# Patient Record
Sex: Female | Born: 1955
Health system: Southern US, Community
[De-identification: ages and names within clinical notes are randomized; demographics above are authoritative.]

## PROBLEM LIST (undated history)

## (undated) DIAGNOSIS — K219 Gastro-esophageal reflux disease without esophagitis: Secondary | ICD-10-CM

## (undated) DIAGNOSIS — K5904 Chronic idiopathic constipation: Secondary | ICD-10-CM

## (undated) DIAGNOSIS — Z973 Presence of spectacles and contact lenses: Secondary | ICD-10-CM

## (undated) DIAGNOSIS — N993 Prolapse of vaginal vault after hysterectomy: Secondary | ICD-10-CM

## (undated) DIAGNOSIS — N816 Rectocele: Secondary | ICD-10-CM

## (undated) DIAGNOSIS — E785 Hyperlipidemia, unspecified: Secondary | ICD-10-CM

## (undated) DIAGNOSIS — F419 Anxiety disorder, unspecified: Secondary | ICD-10-CM

## (undated) DIAGNOSIS — M1990 Unspecified osteoarthritis, unspecified site: Secondary | ICD-10-CM

## (undated) DIAGNOSIS — M810 Age-related osteoporosis without current pathological fracture: Secondary | ICD-10-CM

## (undated) DIAGNOSIS — M5430 Sciatica, unspecified side: Secondary | ICD-10-CM

## (undated) DIAGNOSIS — I8390 Asymptomatic varicose veins of unspecified lower extremity: Secondary | ICD-10-CM

## (undated) HISTORY — PX: TOTAL ABDOMINAL HYSTERECTOMY W/ BILATERAL SALPINGOOPHORECTOMY: SHX83

## (undated) HISTORY — PX: BREAST CYST EXCISION: SHX579

## (undated) HISTORY — DX: Age-related osteoporosis without current pathological fracture: M81.0

## (undated) HISTORY — DX: Unspecified osteoarthritis, unspecified site: M19.90

## (undated) HISTORY — DX: Hyperlipidemia, unspecified: E78.5

## (undated) HISTORY — PX: CHOLECYSTECTOMY: SHX55

## (undated) HISTORY — DX: Sciatica, unspecified side: M54.30

## (undated) HISTORY — PX: ABDOMINAL HYSTERECTOMY: SHX81

---

## 2008-06-14 HISTORY — PX: CHOLECYSTECTOMY, LAPAROSCOPIC: SHX56

## 2016-06-05 ENCOUNTER — Encounter: Payer: Self-pay | Admitting: Emergency Medicine

## 2016-06-05 ENCOUNTER — Emergency Department
Admission: EM | Admit: 2016-06-05 | Discharge: 2016-06-05 | Disposition: A | Discharge status: Home / Self Care | Payer: 59 | Attending: Student in an Organized Health Care Education/Training Program | Admitting: Student in an Organized Health Care Education/Training Program

## 2016-06-05 DIAGNOSIS — J029 Acute pharyngitis, unspecified: Secondary | ICD-10-CM | POA: Diagnosis present

## 2016-06-05 DIAGNOSIS — J069 Acute upper respiratory infection, unspecified: Secondary | ICD-10-CM | POA: Diagnosis not present

## 2016-06-05 DIAGNOSIS — J0100 Acute maxillary sinusitis, unspecified: Secondary | ICD-10-CM | POA: Diagnosis not present

## 2016-06-05 MED ORDER — PREDNISONE 10 MG (21) PO TBPK: ORAL_TABLET | ORAL | 0 refills | Status: DC

## 2016-06-05 MED ORDER — AMOXICILLIN 500 MG PO TABS: 500.0000 mg | ORAL_TABLET | Freq: Three times a day (TID) | ORAL | 0 refills | Status: DC

## 2016-06-05 NOTE — ED Provider Notes (Signed)
[facility] Emergency Department Provider Note  ____________________________________________  Time seen: Approximately 5:42 PM  I have reviewed the triage vital signs and the nursing notes.   HISTORY  Chief Complaint Sore Throat    HPI Cheyenne AdasDonella Smail is a 60 y.o. female who presents to the emergency department for evaluation of sore throat, cough, and sinus congestion x 4 days.She states that her symptoms started with sore throat and have progressed. She does not believe she has had a fever. She states her glands are "swollen." No relief with Advil Cold and Sinus.   History reviewed. No pertinent past medical history.  There are no active problems to display for this patient.   Past Surgical History:  Procedure Laterality Date  . ABDOMINAL HYSTERECTOMY      Prior to Admission medications   Medication Sig Start Date End Date Taking? Authorizing Provider  amoxicillin (AMOXIL) 500 MG tablet Take 1 tablet (500 mg total) by mouth 3 (three) times daily. 06/05/16   Chinita Pesterari B Shriyan Arakawa, FNP  predniSONE (STERAPRED UNI-PAK 21 TAB) 10 MG (21) TBPK tablet Take 6 tablets on day 1 Take 5 tablets on day 2 Take 4 tablets on day 3 Take 3 tablets on day 4 Take 2 tablets on day 5 Take 1 tablet on day 6 06/05/16   Chinita Pesterari B Chaz Ronning, FNP    Allergies Patient has no known allergies.  No family history on file.  Social History Social History  Substance Use Topics  . Smoking status: Never Smoker  . Smokeless tobacco: Not on file  . Alcohol use Not on file    Review of Systems Constitutional: Negative for fever. Eyes: No visual changes. ENT: Positive for sore throat; Negative for difficulty swallowing. Respiratory: Denies shortness of breath. Gastrointestinal: No abdominal pain.  No nausea, no vomiting.  No diarrhea.  Genitourinary: Negative for dysuria. Musculoskeletal: Negative for generalized body aches. Skin: Negative for rash. Neurological: Negative for  headaches, focal weakness or numbness.  ____________________________________________   PHYSICAL EXAM:  VITAL SIGNS: ED Triage Vitals  Enc Vitals Group     BP --      Pulse Rate 06/05/16 1658 93     Resp 06/05/16 1658 20     Temp 06/05/16 1658 98.2 F (36.8 C)     Temp Source 06/05/16 1658 Oral     SpO2 06/05/16 1658 96 %     Weight 06/05/16 1659 145 lb (65.8 kg)     Height 06/05/16 1659 5\' 2"  (1.575 m)     Head Circumference --      Peak Flow --      Pain Score 06/05/16 1659 6     Pain Loc --      Pain Edu? --      Excl. in GC? --    Constitutional: Alert and oriented. Well appearing and in no acute distress. Eyes: Conjunctivae are normal. PERRL. EOMI. Head: Atraumatic. Nose: No congestion/rhinnorhea. Mouth/Throat: Mucous membranes are moist.  Oropharynx erythematous, without tonsillar exudate. Neck: No stridor.  Lymphatic: Lymphadenopathy: Bilateral anterior cervical nodes tender to palpation. Cardiovascular: Normal rate, regular rhythm. Good peripheral circulation. Respiratory: Normal respiratory effort. Faint expiratory wheeze noted on the left that clears with cough. Gastrointestinal: Soft and nontender. Musculoskeletal: No lower extremity tenderness nor edema.   Neurologic:  Normal speech and language. No gross focal neurologic deficits are appreciated. Speech is normal. No gait instability. Skin:  Skin is warm, dry and intact. No rash noted Psychiatric: Mood and affect are normal. Speech  and behavior are normal.  ____________________________________________   LABS (all labs ordered are listed, but only abnormal results are displayed)  Labs Reviewed - No data to display ____________________________________________  EKG   ____________________________________________  RADIOLOGY  Not indicated. ____________________________________________   PROCEDURES  Procedure(s) performed: None  Critical Care performed:  No  ____________________________________________   INITIAL IMPRESSION / ASSESSMENT AND PLAN / ED COURSE  Clinical Course     Pertinent labs & imaging results that were available during my care of the patient were reviewed by me and considered in my medical decision making (see chart for details).  Patient to be given amoxicillin and prednisone. She is to follow up with the PCP of her choice for symptoms that are not improving over the next 2-3 days. She is to return to the ER for symptoms that change or worsen if unable to schedule an appointment. ____________________________________________   FINAL CLINICAL IMPRESSION(S) / ED DIAGNOSES  Final diagnoses:  Acute maxillary sinusitis, recurrence not specified  Upper respiratory infection, acute    Note:  This document was prepared using Dragon voice recognition software and may include unintentional dictation errors.    Chinita PesterCari B Shandria Clinch, FNP 06/05/16 1821    Willy EddyPatrick Robinson, MD 06/05/16 2024

## 2016-06-05 NOTE — ED Notes (Signed)
NAD noted at time of D/C. Pt denies questions or concerns. Pt ambulatory to the lobby at this time.  

## 2016-06-05 NOTE — ED Triage Notes (Signed)
Sore throat x 4 days 

## 2017-05-26 ENCOUNTER — Ambulatory Visit (INDEPENDENT_AMBULATORY_CARE_PROVIDER_SITE_OTHER): Payer: BLUE CROSS/BLUE SHIELD | Admitting: Family Medicine

## 2017-05-26 ENCOUNTER — Encounter: Payer: Self-pay | Admitting: Family Medicine

## 2017-05-26 VITALS — BP 120/80 | HR 94 | Temp 98.5°F | Resp 16 | Ht 62.0 in | Wt 165.0 lb

## 2017-05-26 DIAGNOSIS — Z1239 Encounter for other screening for malignant neoplasm of breast: Secondary | ICD-10-CM

## 2017-05-26 DIAGNOSIS — Z1231 Encounter for screening mammogram for malignant neoplasm of breast: Secondary | ICD-10-CM | POA: Diagnosis not present

## 2017-05-26 DIAGNOSIS — Z7689 Persons encountering health services in other specified circumstances: Secondary | ICD-10-CM

## 2017-05-26 DIAGNOSIS — Z0000 Encounter for general adult medical examination without abnormal findings: Secondary | ICD-10-CM

## 2017-05-27 NOTE — Progress Notes (Signed)
Subjective:    Patient ID: Taylor Singleton, female    DOB: June 03, 1956, 61 y.o.   MRN: [id number]  HPI Patient is a very pleasant 61 year old white female here today to establish care.  She denies any significant past medical history.  Past surgical history is significant only for a total abdominal hysterectomy with BSO performed in the patient's 40s due to excessive bleeding.  Patient did not receive hormone therapy afterwards.  She has not seen a gynecologist in several years.  She has not had a Pap smear in several years.  Her mammogram is overdue.  Her last colonoscopy was in approximately 2013.  It was reportedly clear.  She is due for repeat colonoscopy in 2023.  She is also due for hepatitis C screening.  From an immunization standpoint, she is due for a flu shot.  She is not yet due for Pneumovax or Prevnar.  She is due for the shingles vaccine as well as a tetanus shot. No past medical history on file.  Past Surgical History:  Procedure Laterality Date  . ABDOMINAL HYSTERECTOMY     due to bleeding TAH/BSO   Current Outpatient Medications on File Prior to Visit  Medication Sig Dispense Refill  . naproxen (NAPROSYN) 250 MG tablet Take 250 mg by mouth daily as needed.     No current facility-administered medications on file prior to visit.    No Known Allergies Social History   Socioeconomic History  . Marital status: Married    Spouse name: Not on file  . Number of children: Not on file  . Years of education: Not on file  . Highest education level: Not on file  Social Needs  . Financial resource strain: Not on file  . Food insecurity - worry: Not on file  . Food insecurity - inability: Not on file  . Transportation needs - medical: Not on file  . Transportation needs - non-medical: Not on file  Occupational History  . Not on file  Tobacco Use  . Smoking status: Never Smoker  . Smokeless tobacco: Never Used  Substance and Sexual Activity  . Alcohol use: No    Frequency:  Never  . Drug use: No  . Sexual activity: Not on file  Other Topics Concern  . Not on file  Social History Narrative  . Not on file   No family history on file. Denies history of breast, colon cancer or premature CAD. Mom diseased in her 1580's.  Had heart disease.  Dad- alive and 63 years old.  No medical problems.  Review of Systems  All other systems reviewed and are negative.      Objective:   Physical Exam  Constitutional: She is oriented to person, place, and time. She appears well-developed and well-nourished. No distress.  HENT:  Head: Normocephalic and atraumatic.  Right Ear: External ear normal.  Left Ear: External ear normal.  Nose: Nose normal.  Mouth/Throat: Oropharynx is clear and moist. No oropharyngeal exudate.  Eyes: Conjunctivae and EOM are normal. Pupils are equal, round, and reactive to light. Right eye exhibits no discharge. Left eye exhibits no discharge. No scleral icterus.  Neck: Normal range of motion. Neck supple. No JVD present. No tracheal deviation present. No thyromegaly present.  Cardiovascular: Normal rate, regular rhythm, normal heart sounds and intact distal pulses. Exam reveals no gallop and no friction rub.  No murmur heard. Pulmonary/Chest: Effort normal and breath sounds normal. No stridor. No respiratory distress. She has no wheezes. She has no  rales. She exhibits no tenderness.  Abdominal: Soft. Bowel sounds are normal. She exhibits no distension and no mass. There is no tenderness. There is no rebound and no guarding.  Musculoskeletal: Normal range of motion. She exhibits no edema, tenderness or deformity.  Lymphadenopathy:    She has no cervical adenopathy.  Neurological: She is alert and oriented to person, place, and time. She has normal reflexes. She displays normal reflexes. No cranial nerve deficit. She exhibits normal muscle tone. Coordination normal.  Skin: Skin is warm. No rash noted. She is not diaphoretic. No erythema. No pallor.   Psychiatric: She has a normal mood and affect. Her behavior is normal. Judgment and thought content normal.  Vitals reviewed.         Assessment & Plan:  General medical exam - Plan: CBC with Differential/Platelet, COMPLETE METABOLIC PANEL WITH GFR, Lipid panel, Hepatitis C Antibody  Establishing care with new doctor, encounter for  Breast cancer screening - Plan: MM Digital Screening  Physical exam is completely normal.  I recommended a flu shot but the patient refused.  We discussed the shingles shot but she also declined this as well.  She is due for a tetanus shot but she declined this as well.  She will allow me to schedule her for a mammogram.  Because of her past medical history, she does not require a Pap smear.  Her colonoscopy is up-to-date.  She is due for hepatitis C screening.  I will also screen her with a CBC to evaluate for any bone marrow abnormalities, CMP to assess renal and liver function test along with fasting blood sugar, and a fasting lipid panel as the patient states that she may have had borderline cholesterol in the past.  Strongly recommended a bone density test given her history of premature osteoporosis due to surgery.  Patient declined this today.  Recommended calcium 1200 mg a day and vitamin D 1000.

## 2017-05-30 ENCOUNTER — Other Ambulatory Visit: Payer: Self-pay | Admitting: Family Medicine

## 2017-05-30 DIAGNOSIS — Z1231 Encounter for screening mammogram for malignant neoplasm of breast: Secondary | ICD-10-CM

## 2017-06-02 ENCOUNTER — Other Ambulatory Visit: Payer: BLUE CROSS/BLUE SHIELD

## 2017-06-03 ENCOUNTER — Encounter: Payer: Self-pay | Admitting: Family Medicine

## 2017-06-03 DIAGNOSIS — E785 Hyperlipidemia, unspecified: Secondary | ICD-10-CM | POA: Insufficient documentation

## 2017-06-03 LAB — COMPLETE METABOLIC PANEL WITH GFR
AG Ratio: 1.7 (calc) (ref 1.0–2.5)
ALT: 22 U/L (ref 6–29)
AST: 16 U/L (ref 10–35)
Albumin: 4.1 g/dL (ref 3.6–5.1)
Alkaline phosphatase (APISO): 77 U/L (ref 33–130)
BUN: 21 mg/dL (ref 7–25)
CALCIUM: 9.6 mg/dL (ref 8.6–10.4)
CO2: 24 mmol/L (ref 20–32)
CREATININE: 0.75 mg/dL (ref 0.50–0.99)
Chloride: 105 mmol/L (ref 98–110)
GFR, EST AFRICAN AMERICAN: 98 mL/min/{1.73_m2} (ref >=60)
GFR, EST NON AFRICAN AMERICAN: 85 mL/min/{1.73_m2} (ref >=60)
GLOBULIN: 2.4 g/dL (ref 1.9–3.7)
Glucose, Bld: 99 mg/dL (ref 65–99)
Potassium: 4.9 mmol/L (ref 3.5–5.3)
SODIUM: 140 mmol/L (ref 135–146)
TOTAL PROTEIN: 6.5 g/dL (ref 6.1–8.1)
Total Bilirubin: 0.4 mg/dL (ref 0.2–1.2)

## 2017-06-03 LAB — HEPATITIS C ANTIBODY
HEP C AB: NONREACTIVE
SIGNAL TO CUT-OFF: 0.01 (ref <1.00)

## 2017-06-03 LAB — CBC WITH DIFFERENTIAL/PLATELET
BASOS PCT: 1 %
Basophils Absolute: 58 cells/uL (ref 0–200)
EOS PCT: 7.5 %
Eosinophils Absolute: 435 cells/uL (ref 15–500)
HEMATOCRIT: 41.8 % (ref 35.0–45.0)
HEMOGLOBIN: 14.1 g/dL (ref 11.7–15.5)
LYMPHS ABS: 2204 {cells}/uL (ref 850–3900)
MCH: 29.4 pg (ref 27.0–33.0)
MCHC: 33.7 g/dL (ref 32.0–36.0)
MCV: 87.3 fL (ref 80.0–100.0)
MPV: 10.6 fL (ref 7.5–12.5)
Monocytes Relative: 8.5 %
NEUTROS ABS: 2610 {cells}/uL (ref 1500–7800)
NEUTROS PCT: 45 %
Platelets: 379 10*3/uL (ref 140–400)
RBC: 4.79 10*6/uL (ref 3.80–5.10)
RDW: 12.2 % (ref 11.0–15.0)
Total Lymphocyte: 38 %
WBC: 5.8 10*3/uL (ref 3.8–10.8)
WBCMIX: 493 {cells}/uL (ref 200–950)

## 2017-06-03 LAB — LIPID PANEL
CHOL/HDL RATIO: 4.7 (calc) (ref <5.0)
Cholesterol: 234 mg/dL — ABNORMAL HIGH (ref <200)
HDL: 50 mg/dL — AB (ref >50)
LDL Cholesterol (Calc): 158 mg/dL (calc) — ABNORMAL HIGH
NON-HDL CHOLESTEROL (CALC): 184 mg/dL — AB (ref <130)
TRIGLYCERIDES: 139 mg/dL (ref <150)

## 2017-06-06 ENCOUNTER — Ambulatory Visit: Payer: Self-pay | Admitting: Family Medicine

## 2017-06-09 ENCOUNTER — Other Ambulatory Visit: Payer: Self-pay | Admitting: Family Medicine

## 2017-06-09 MED ORDER — ATORVASTATIN CALCIUM 20 MG PO TABS: 20.0000 mg | ORAL_TABLET | Freq: Every day | ORAL | 3 refills | Status: DC

## 2017-06-28 ENCOUNTER — Ambulatory Visit
Admission: RE | Admit: 2017-06-28 | Discharge: 2017-06-28 | Disposition: A | Discharge status: Home / Self Care | Payer: 59 | Source: Ambulatory Visit | Attending: Family Medicine | Admitting: Family Medicine

## 2017-06-28 DIAGNOSIS — Z1231 Encounter for screening mammogram for malignant neoplasm of breast: Secondary | ICD-10-CM

## 2017-09-26 ENCOUNTER — Ambulatory Visit (INDEPENDENT_AMBULATORY_CARE_PROVIDER_SITE_OTHER): Payer: BLUE CROSS/BLUE SHIELD | Admitting: Family Medicine

## 2017-09-26 ENCOUNTER — Encounter: Payer: Self-pay | Admitting: Family Medicine

## 2017-09-26 VITALS — BP 128/70 | HR 86 | Temp 98.3°F | Resp 18 | Ht 62.0 in | Wt 164.0 lb

## 2017-09-26 DIAGNOSIS — J302 Other seasonal allergic rhinitis: Secondary | ICD-10-CM

## 2017-09-26 MED ORDER — PREDNISONE 20 MG PO TABS: ORAL_TABLET | ORAL | 0 refills | Status: DC

## 2017-09-26 MED ORDER — ATORVASTATIN CALCIUM 20 MG PO TABS: 20.0000 mg | ORAL_TABLET | Freq: Every day | ORAL | 3 refills | Status: DC

## 2017-09-26 NOTE — Progress Notes (Signed)
Subjective:    Patient ID: Taylor Singleton, female    DOB: April 25, 1956, 62 y.o.   MRN: [id number]  HPI Patient presents with severe rhinorrhea, head congestion, pnd, cough which is productive of clear mucus, clear sinus drainage, faint expiratory wheezing, and rhonchorous breath sounds for 1 week.  [place]oved last year [place]  No fever, sod, doe, pleurisy.   Past Medical History:  Diagnosis Date  . HLD (hyperlipidemia)    Past Surgical History:  Procedure Laterality Date  . ABDOMINAL HYSTERECTOMY     due to bleeding TAH/BSO  . BREAST CYST EXCISION Left    Current Outpatient Medications on File Prior to Visit  Medication Sig Dispense Refill  . calcium carbonate (CALCIUM 600) 600 MG TABS tablet Take 600 mg by mouth 2 (two) times daily with a meal.    . cholecalciferol (VITAMIN D) 1000 units tablet Take 1,000 Units by mouth daily.    . naproxen (NAPROSYN) 250 MG tablet Take 250 mg by mouth daily as needed.     No current facility-administered medications on file prior to visit.    No Known Allergies Social History   Socioeconomic History  . Marital status: Married    Spouse name: Not on file  . Number of children: Not on file  . Years of education: Not on file  . Highest education level: Not on file  Occupational History  . Not on file  Social Needs  . Financial resource strain: Not on file  . Food insecurity:    Worry: Not on file    Inability: Not on file  . Transportation needs:    Medical: Not on file    Non-medical: Not on file  Tobacco Use  . Smoking status: Never Smoker  . Smokeless tobacco: Never Used  Substance and Sexual Activity  . Alcohol use: No    Frequency: Never  . Drug use: No  . Sexual activity: Not on file  Lifestyle  . Physical activity:    Days per week: Not on file    Minutes per session: Not on file  . Stress: Not on file  Relationships  . Social connections:    Talks on phone: Not on file    Gets together: Not on file    Attends  religious service: Not on file    Active member of club or organization: Not on file    Attends meetings of clubs or organizations: Not on file    Relationship status: Not on file  . Intimate partner violence:    Fear of current or ex partner: Not on file    Emotionally abused: Not on file    Physically abused: Not on file    Forced sexual activity: Not on file  Other Topics Concern  . Not on file  Social History Narrative  . Not on file     Review of Systems     Objective:   Physical Exam  Constitutional: She appears well-developed and well-nourished.  HENT:  Right Ear: Tympanic membrane and ear canal normal.  Left Ear: Tympanic membrane and ear canal normal.  Nose: Mucosal edema and rhinorrhea present. Right sinus exhibits no maxillary sinus tenderness and no frontal sinus tenderness. Left sinus exhibits no maxillary sinus tenderness and no frontal sinus tenderness.  Mouth/Throat: No oropharyngeal exudate, posterior oropharyngeal edema or posterior oropharyngeal erythema.  Neck: Neck supple.  Cardiovascular: Normal rate, regular rhythm and normal heart sounds.  Pulmonary/Chest: Effort normal. No respiratory distress. She has wheezes. She has  no rales.  Lymphadenopathy:    She has no cervical adenopathy.          Assessment & Plan:  Seasonal allergies  Patient is failing flonase as outpatient.  Continue flonase but treat with prednisone taper pack. Once better, continue flonase and zyrtec until spring allergy season passes.

## 2018-01-18 ENCOUNTER — Encounter: Payer: Self-pay | Admitting: Family Medicine

## 2018-01-18 ENCOUNTER — Ambulatory Visit (INDEPENDENT_AMBULATORY_CARE_PROVIDER_SITE_OTHER): Payer: BLUE CROSS/BLUE SHIELD | Admitting: Family Medicine

## 2018-01-18 VITALS — BP 128/74 | HR 82 | Temp 98.2°F | Resp 16 | Ht 62.0 in | Wt 170.0 lb

## 2018-01-18 DIAGNOSIS — L03031 Cellulitis of right toe: Secondary | ICD-10-CM | POA: Diagnosis not present

## 2018-01-18 MED ORDER — CEPHALEXIN 500 MG PO CAPS: 500.0000 mg | ORAL_CAPSULE | Freq: Four times a day (QID) | ORAL | 0 refills | Status: AC

## 2018-01-18 NOTE — Progress Notes (Signed)
Patient ID: Taylor Singleton, female    DOB: June 29, 1955, 62 y.o.   MRN: [id number]  PCP: Donita Brooks, MD  Chief Complaint  Patient presents with  . Right great toe swollen, red and painful    Subjective:   Taylor Singleton is a 62 y.o. female, presents to clinic with CC of right great toe pain, swelling and redness x 1-2 days. wprsemomg.   She had some irritation to outer edge of right great toe nail.  She was unsur eif it was an infection or ingrown toenail.  But she attempted to treat the small area of redness, swelling and pain with trimming the medial edge of the nail very short with clippers.  She had some drainage from the medial side of the nail that is currently dried and crusted.  The amount of redness and swelling is unchanged from before.  It is painful to walk or move her feet.   Toe Pain   The incident occurred 2 days ago. There was no injury mechanism. The pain is present in the right toes. The quality of the pain is described as aching and stabbing. The pain is moderate. The pain has been worsening since onset. She reports no foreign bodies present. The symptoms are aggravated by weight bearing, palpation and movement. She has tried nothing for the symptoms.      Patient Active Problem List   Diagnosis Date Noted  . HLD (hyperlipidemia)      Prior to Admission medications   Medication Sig Start Date End Date Taking? Authorizing Provider  atorvastatin (LIPITOR) 20 MG tablet Take 1 tablet (20 mg total) by mouth daily. 09/26/17  Yes Donita Brooks, MD  calcium carbonate (CALCIUM 600) 600 MG TABS tablet Take 600 mg by mouth 2 (two) times daily with a meal.   Yes [provider]  cholecalciferol (VITAMIN D) 1000 units tablet Take 1,000 Units by mouth daily.   Yes [provider]  naproxen (NAPROSYN) 250 MG tablet Take 250 mg by mouth daily as needed.   Yes [provider]     No Known Allergies   No family history on file.   Social  History   Socioeconomic History  . Marital status: Married    Spouse name: Not on file  . Number of children: Not on file  . Years of education: Not on file  . Highest education level: Not on file  Occupational History  . Not on file  Social Needs  . Financial resource strain: Not on file  . Food insecurity:    Worry: Not on file    Inability: Not on file  . Transportation needs:    Medical: Not on file    Non-medical: Not on file  Tobacco Use  . Smoking status: Never Smoker  . Smokeless tobacco: Never Used  Substance and Sexual Activity  . Alcohol use: No    Frequency: Never  . Drug use: No  . Sexual activity: Not on file  Lifestyle  . Physical activity:    Days per week: Not on file    Minutes per session: Not on file  . Stress: Not on file  Relationships  . Social connections:    Talks on phone: Not on file    Gets together: Not on file    Attends religious service: Not on file    Active member of club or organization: Not on file    Attends meetings of clubs or organizations: Not on file  Relationship status: Not on file  . Intimate partner violence:    Fear of current or ex partner: Not on file    Emotionally abused: Not on file    Physically abused: Not on file    Forced sexual activity: Not on file  Other Topics Concern  . Not on file  Social History Narrative  . Not on file     Review of Systems  Constitutional: Negative.  Negative for activity change, appetite change and fatigue.  HENT: Negative.   Eyes: Negative.   Respiratory: Negative.   Cardiovascular: Negative.   Gastrointestinal: Negative.   Endocrine: Negative.   Genitourinary: Negative.   Musculoskeletal: Negative.   Skin: Negative.   Allergic/Immunologic: Negative.   Hematological: Negative.   All other systems reviewed and are negative.      Objective:    Vitals:   01/18/18 1533  BP: 128/74  Pulse: 82  Resp: 16  Temp: 98.2 F (36.8 C)  TempSrc: Oral  SpO2: 98%    Weight: 170 lb (77.1 kg)  Height: 5\' 2"  (1.575 m)      Physical Exam  Constitutional: She appears well-developed.  HENT:  Head: Normocephalic and atraumatic.  Nose: Nose normal.  Eyes: Conjunctivae are normal. Right eye exhibits no discharge. Left eye exhibits no discharge.  Neck: No tracheal deviation present.  Cardiovascular: Normal rate and regular rhythm.  Pulmonary/Chest: Effort normal. No stridor. No respiratory distress.  Musculoskeletal: Normal range of motion.  Right great toe medial nail margin with mild edema and erythema that is soft to palpation, no induration no fluctuance, mildly ttp.  Nail has been cut shot on medial side with brown crusting to distal medial nail margin, no active drainage, no purulence Normal ROM, sensation, capillary refill  Neurological: She is alert. She exhibits normal muscle tone. Coordination normal.  Skin: Skin is warm and dry. No rash noted.  Psychiatric: She has a normal mood and affect. Her behavior is normal.  Nursing note and vitals reviewed.         Assessment & Plan:      ICD-10-CM   1. Paronychia of great toe, right L03.031    Warm soaks with antiseptic, antibiotic ointment and bandage throughout the day, hold on oral antibiotics, but script for abx printed.  To follow up if worsening area of swelling - will need paronychia I&D, but currently having some drainage.    Danelle BerryLeisa Vincy Feliz, PA-C 01/18/18 3:55 PM

## 2018-01-18 NOTE — Patient Instructions (Addendum)
Do warm soaks 2 x a day for the next several days to a week Use warm clean water with hibiclens (an antiseptic soap) and half hydrogen peroxide.  Apply over the counter antibiotic ointment to affected nail edge during the day or when going out, keep covered with a bandage.  Return for procedure - I&D (incision and drainage) if there is any worsening. (growing area of swelling, redness, firmness extending around nail edge or up toe.   Paronychia Paronychia is an infection of the skin. It happens near a fingernail or toenail. It may cause pain and swelling around the nail. Usually, it is not serious and it clears up with treatment. Follow these instructions at home:  Soak the fingers or toes in warm water as told by your doctor. You may be told to do this for 20 minutes, 2-3 times a day.  Keep the area dry when you are not soaking it.  Take medicines only as told by your doctor.  If you were given an antibiotic medicine, finish all of it even if you start to feel better.  Keep the affected area clean.  Do not try to drain a fluid-filled bump yourself.  Wear rubber gloves when putting your hands in water.  Wear gloves if your hands might touch cleaners or chemicals.  Follow your doctor's instructions about: ? Wound care. ? Bandage (dressing) changes and removal. Contact a doctor if:  Your symptoms get worse or do not improve.  You have a fever or chills.  You have redness spreading from the affected area.  You have more fluid, blood, or pus coming from the affected area.  Your finger or knuckle is swollen or is hard to move. This information is not intended to replace advice given to you by your health care provider. Make sure you discuss any questions you have with your health care provider. Document Released: 05/19/2009 Document Revised: 11/06/2015 Document Reviewed: 05/08/2014 Elsevier Interactive Patient Education  [employer]2018 [employer]

## 2018-02-09 ENCOUNTER — Ambulatory Visit (INDEPENDENT_AMBULATORY_CARE_PROVIDER_SITE_OTHER): Payer: BLUE CROSS/BLUE SHIELD | Admitting: Podiatry

## 2018-02-09 DIAGNOSIS — M79676 Pain in unspecified toe(s): Secondary | ICD-10-CM

## 2018-02-09 DIAGNOSIS — L600 Ingrowing nail: Secondary | ICD-10-CM

## 2018-02-09 DIAGNOSIS — L03032 Cellulitis of left toe: Secondary | ICD-10-CM

## 2018-02-09 NOTE — Progress Notes (Signed)
  Subjective:  Patient ID: Taylor Singleton, female    DOB: 04-May-1956,  MRN: [id number]  Chief Complaint  Patient presents with  . Ingrown Toenail    left hallux    62 y.o. female presents with the above complaint. Reports ingrown toenail to the left great toe. Thinks it started when she got a pedicure a little over a month ago.  States that she went to her primary care doctor office and was started on ciprofloxacin today.  Only took the pills twice.   Review of Systems: Negative except as noted in the HPI. Denies N/V/F/Ch.  Past Medical History:  Diagnosis Date  . HLD (hyperlipidemia)     Current Outpatient Medications:  .  atorvastatin (LIPITOR) 20 MG tablet, Take 1 tablet (20 mg total) by mouth daily., Disp: 90 tablet, Rfl: 3 .  calcium carbonate (CALCIUM 600) 600 MG TABS tablet, Take 600 mg by mouth 2 (two) times daily with a meal., Disp: , Rfl:  .  cholecalciferol (VITAMIN D) 1000 units tablet, Take 1,000 Units by mouth daily., Disp: , Rfl:  .  naproxen (NAPROSYN) 250 MG tablet, Take 250 mg by mouth daily as needed., Disp: , Rfl:   Social History   Tobacco Use  Smoking Status Never Smoker  Smokeless Tobacco Never Used    No Known Allergies Objective:  There were no vitals filed for this visit. There is no height or weight on file to calculate BMI. Constitutional Well developed. Well nourished.  Vascular Dorsalis pedis pulses palpable bilaterally. Posterior tibial pulses palpable bilaterally. Capillary refill normal to all digits.  No cyanosis or clubbing noted. Pedal hair growth normal.  Neurologic Normal speech. Oriented to person, place, and time. Epicritic sensation to light touch grossly present bilaterally.  Dermatologic Painful ingrowing nail at medial nail borders of the hallux nail left. No other open wounds. No skin lesions.  Orthopedic: Normal joint ROM without pain or crepitus bilaterally. No visible deformities. No bony tenderness.   Radiographs:  None Assessment:   1. Ingrown nail   2. Paronychia of great toe, left   3. Pain around toenail    Plan:  Patient was evaluated and treated and all questions answered.  Ingrown Nail, left -Patient elects to proceed with minor surgery to remove ingrown toenail removal today. Consent reviewed and signed by patient. -Ingrown nail excised. See procedure note. -Educated on post-procedure care including soaking. Written instructions provided and reviewed. -Patient to follow up in 2 weeks for nail check.  Procedure: Excision of Ingrown Toenail Location: Left 1st toe lateral nail borders. Anesthesia: Lidocaine 1% plain; 1.5 mL and Marcaine 0.5% plain; 1.5 mL, digital block. Skin Prep: Betadine. Dressing: Silvadene; telfa; dry, sterile, compression dressing. Technique: Following skin prep, the toe was exsanguinated and a tourniquet was secured at the base of the toe. The affected nail border was freed, split with a nail splitter, and excised. Chemical matrixectomy was then performed with phenol and irrigated out with alcohol. The tourniquet was then removed and sterile dressing applied. Disposition: Patient tolerated procedure well. Patient to return in 2 weeks for follow-up.   Return in about 2 weeks (around 02/23/2018) for Nail Check.

## 2018-02-09 NOTE — Patient Instructions (Signed)

## 2018-02-15 ENCOUNTER — Ambulatory Visit: Payer: BLUE CROSS/BLUE SHIELD | Admitting: Family Medicine

## 2018-02-24 ENCOUNTER — Ambulatory Visit: Payer: BLUE CROSS/BLUE SHIELD | Admitting: Podiatry

## 2018-07-25 DIAGNOSIS — M5432 Sciatica, left side: Secondary | ICD-10-CM | POA: Diagnosis not present

## 2018-07-25 DIAGNOSIS — M5431 Sciatica, right side: Secondary | ICD-10-CM | POA: Diagnosis not present

## 2018-07-25 DIAGNOSIS — M9905 Segmental and somatic dysfunction of pelvic region: Secondary | ICD-10-CM | POA: Diagnosis not present

## 2018-07-25 DIAGNOSIS — M9903 Segmental and somatic dysfunction of lumbar region: Secondary | ICD-10-CM | POA: Diagnosis not present

## 2018-07-31 DIAGNOSIS — M9905 Segmental and somatic dysfunction of pelvic region: Secondary | ICD-10-CM | POA: Diagnosis not present

## 2018-07-31 DIAGNOSIS — M5432 Sciatica, left side: Secondary | ICD-10-CM | POA: Diagnosis not present

## 2018-07-31 DIAGNOSIS — M9903 Segmental and somatic dysfunction of lumbar region: Secondary | ICD-10-CM | POA: Diagnosis not present

## 2018-07-31 DIAGNOSIS — M5431 Sciatica, right side: Secondary | ICD-10-CM | POA: Diagnosis not present

## 2018-08-21 DIAGNOSIS — R69 Illness, unspecified: Secondary | ICD-10-CM | POA: Diagnosis not present

## 2018-08-23 DIAGNOSIS — M9903 Segmental and somatic dysfunction of lumbar region: Secondary | ICD-10-CM | POA: Diagnosis not present

## 2018-08-23 DIAGNOSIS — M9905 Segmental and somatic dysfunction of pelvic region: Secondary | ICD-10-CM | POA: Diagnosis not present

## 2018-08-23 DIAGNOSIS — M5431 Sciatica, right side: Secondary | ICD-10-CM | POA: Diagnosis not present

## 2018-08-23 DIAGNOSIS — M5432 Sciatica, left side: Secondary | ICD-10-CM | POA: Diagnosis not present

## 2018-09-12 DIAGNOSIS — M9903 Segmental and somatic dysfunction of lumbar region: Secondary | ICD-10-CM | POA: Diagnosis not present

## 2018-09-12 DIAGNOSIS — M5431 Sciatica, right side: Secondary | ICD-10-CM | POA: Diagnosis not present

## 2018-09-12 DIAGNOSIS — M9905 Segmental and somatic dysfunction of pelvic region: Secondary | ICD-10-CM | POA: Diagnosis not present

## 2018-09-12 DIAGNOSIS — M5432 Sciatica, left side: Secondary | ICD-10-CM | POA: Diagnosis not present

## 2018-09-27 ENCOUNTER — Other Ambulatory Visit: Payer: Self-pay | Admitting: Family Medicine

## 2018-10-17 DIAGNOSIS — M5432 Sciatica, left side: Secondary | ICD-10-CM | POA: Diagnosis not present

## 2018-10-17 DIAGNOSIS — M9905 Segmental and somatic dysfunction of pelvic region: Secondary | ICD-10-CM | POA: Diagnosis not present

## 2018-10-17 DIAGNOSIS — M9903 Segmental and somatic dysfunction of lumbar region: Secondary | ICD-10-CM | POA: Diagnosis not present

## 2018-10-17 DIAGNOSIS — M5431 Sciatica, right side: Secondary | ICD-10-CM | POA: Diagnosis not present

## 2018-11-07 DIAGNOSIS — Z0100 Encounter for examination of eyes and vision without abnormal findings: Secondary | ICD-10-CM | POA: Diagnosis not present

## 2018-11-08 DIAGNOSIS — M5432 Sciatica, left side: Secondary | ICD-10-CM | POA: Diagnosis not present

## 2018-11-08 DIAGNOSIS — M5431 Sciatica, right side: Secondary | ICD-10-CM | POA: Diagnosis not present

## 2018-11-08 DIAGNOSIS — M9905 Segmental and somatic dysfunction of pelvic region: Secondary | ICD-10-CM | POA: Diagnosis not present

## 2018-11-08 DIAGNOSIS — M9903 Segmental and somatic dysfunction of lumbar region: Secondary | ICD-10-CM | POA: Diagnosis not present

## 2018-11-27 DIAGNOSIS — M9905 Segmental and somatic dysfunction of pelvic region: Secondary | ICD-10-CM | POA: Diagnosis not present

## 2018-11-27 DIAGNOSIS — M5432 Sciatica, left side: Secondary | ICD-10-CM | POA: Diagnosis not present

## 2018-11-27 DIAGNOSIS — M5431 Sciatica, right side: Secondary | ICD-10-CM | POA: Diagnosis not present

## 2018-11-27 DIAGNOSIS — M9903 Segmental and somatic dysfunction of lumbar region: Secondary | ICD-10-CM | POA: Diagnosis not present

## 2018-12-12 DIAGNOSIS — M9905 Segmental and somatic dysfunction of pelvic region: Secondary | ICD-10-CM | POA: Diagnosis not present

## 2018-12-12 DIAGNOSIS — M5432 Sciatica, left side: Secondary | ICD-10-CM | POA: Diagnosis not present

## 2018-12-12 DIAGNOSIS — M5431 Sciatica, right side: Secondary | ICD-10-CM | POA: Diagnosis not present

## 2018-12-12 DIAGNOSIS — M9903 Segmental and somatic dysfunction of lumbar region: Secondary | ICD-10-CM | POA: Diagnosis not present

## 2019-01-02 DIAGNOSIS — M5431 Sciatica, right side: Secondary | ICD-10-CM | POA: Diagnosis not present

## 2019-01-02 DIAGNOSIS — M5432 Sciatica, left side: Secondary | ICD-10-CM | POA: Diagnosis not present

## 2019-01-02 DIAGNOSIS — M9903 Segmental and somatic dysfunction of lumbar region: Secondary | ICD-10-CM | POA: Diagnosis not present

## 2019-01-02 DIAGNOSIS — M9905 Segmental and somatic dysfunction of pelvic region: Secondary | ICD-10-CM | POA: Diagnosis not present

## 2019-01-29 DIAGNOSIS — M9905 Segmental and somatic dysfunction of pelvic region: Secondary | ICD-10-CM | POA: Diagnosis not present

## 2019-01-29 DIAGNOSIS — M5432 Sciatica, left side: Secondary | ICD-10-CM | POA: Diagnosis not present

## 2019-01-29 DIAGNOSIS — M5431 Sciatica, right side: Secondary | ICD-10-CM | POA: Diagnosis not present

## 2019-01-29 DIAGNOSIS — M9903 Segmental and somatic dysfunction of lumbar region: Secondary | ICD-10-CM | POA: Diagnosis not present

## 2019-02-09 DIAGNOSIS — M5431 Sciatica, right side: Secondary | ICD-10-CM | POA: Diagnosis not present

## 2019-02-09 DIAGNOSIS — M5432 Sciatica, left side: Secondary | ICD-10-CM | POA: Diagnosis not present

## 2019-02-09 DIAGNOSIS — M9905 Segmental and somatic dysfunction of pelvic region: Secondary | ICD-10-CM | POA: Diagnosis not present

## 2019-02-09 DIAGNOSIS — M9903 Segmental and somatic dysfunction of lumbar region: Secondary | ICD-10-CM | POA: Diagnosis not present

## 2019-02-26 DIAGNOSIS — R69 Illness, unspecified: Secondary | ICD-10-CM | POA: Diagnosis not present

## 2019-02-28 DIAGNOSIS — M5432 Sciatica, left side: Secondary | ICD-10-CM | POA: Diagnosis not present

## 2019-02-28 DIAGNOSIS — M9903 Segmental and somatic dysfunction of lumbar region: Secondary | ICD-10-CM | POA: Diagnosis not present

## 2019-02-28 DIAGNOSIS — M9905 Segmental and somatic dysfunction of pelvic region: Secondary | ICD-10-CM | POA: Diagnosis not present

## 2019-02-28 DIAGNOSIS — M5431 Sciatica, right side: Secondary | ICD-10-CM | POA: Diagnosis not present

## 2019-03-13 DIAGNOSIS — M9903 Segmental and somatic dysfunction of lumbar region: Secondary | ICD-10-CM | POA: Diagnosis not present

## 2019-03-13 DIAGNOSIS — M5431 Sciatica, right side: Secondary | ICD-10-CM | POA: Diagnosis not present

## 2019-03-13 DIAGNOSIS — M5432 Sciatica, left side: Secondary | ICD-10-CM | POA: Diagnosis not present

## 2019-03-13 DIAGNOSIS — M9905 Segmental and somatic dysfunction of pelvic region: Secondary | ICD-10-CM | POA: Diagnosis not present

## 2019-03-30 DIAGNOSIS — M5431 Sciatica, right side: Secondary | ICD-10-CM | POA: Diagnosis not present

## 2019-03-30 DIAGNOSIS — M5432 Sciatica, left side: Secondary | ICD-10-CM | POA: Diagnosis not present

## 2019-03-30 DIAGNOSIS — M9903 Segmental and somatic dysfunction of lumbar region: Secondary | ICD-10-CM | POA: Diagnosis not present

## 2019-03-30 DIAGNOSIS — M9905 Segmental and somatic dysfunction of pelvic region: Secondary | ICD-10-CM | POA: Diagnosis not present

## 2019-04-23 DIAGNOSIS — M5432 Sciatica, left side: Secondary | ICD-10-CM | POA: Diagnosis not present

## 2019-04-23 DIAGNOSIS — M9905 Segmental and somatic dysfunction of pelvic region: Secondary | ICD-10-CM | POA: Diagnosis not present

## 2019-04-23 DIAGNOSIS — M5431 Sciatica, right side: Secondary | ICD-10-CM | POA: Diagnosis not present

## 2019-04-23 DIAGNOSIS — M9903 Segmental and somatic dysfunction of lumbar region: Secondary | ICD-10-CM | POA: Diagnosis not present

## 2019-05-09 DIAGNOSIS — M9905 Segmental and somatic dysfunction of pelvic region: Secondary | ICD-10-CM | POA: Diagnosis not present

## 2019-05-09 DIAGNOSIS — M9903 Segmental and somatic dysfunction of lumbar region: Secondary | ICD-10-CM | POA: Diagnosis not present

## 2019-05-09 DIAGNOSIS — M5432 Sciatica, left side: Secondary | ICD-10-CM | POA: Diagnosis not present

## 2019-05-09 DIAGNOSIS — M5431 Sciatica, right side: Secondary | ICD-10-CM | POA: Diagnosis not present

## 2019-05-30 DIAGNOSIS — M5431 Sciatica, right side: Secondary | ICD-10-CM | POA: Diagnosis not present

## 2019-05-30 DIAGNOSIS — M9905 Segmental and somatic dysfunction of pelvic region: Secondary | ICD-10-CM | POA: Diagnosis not present

## 2019-05-30 DIAGNOSIS — M9903 Segmental and somatic dysfunction of lumbar region: Secondary | ICD-10-CM | POA: Diagnosis not present

## 2019-05-30 DIAGNOSIS — M5432 Sciatica, left side: Secondary | ICD-10-CM | POA: Diagnosis not present

## 2019-06-26 DIAGNOSIS — M9903 Segmental and somatic dysfunction of lumbar region: Secondary | ICD-10-CM | POA: Diagnosis not present

## 2019-06-26 DIAGNOSIS — M9905 Segmental and somatic dysfunction of pelvic region: Secondary | ICD-10-CM | POA: Diagnosis not present

## 2019-06-26 DIAGNOSIS — M5432 Sciatica, left side: Secondary | ICD-10-CM | POA: Diagnosis not present

## 2019-06-26 DIAGNOSIS — M5431 Sciatica, right side: Secondary | ICD-10-CM | POA: Diagnosis not present

## 2019-07-03 DIAGNOSIS — M9905 Segmental and somatic dysfunction of pelvic region: Secondary | ICD-10-CM | POA: Diagnosis not present

## 2019-07-03 DIAGNOSIS — M5431 Sciatica, right side: Secondary | ICD-10-CM | POA: Diagnosis not present

## 2019-07-03 DIAGNOSIS — M9903 Segmental and somatic dysfunction of lumbar region: Secondary | ICD-10-CM | POA: Diagnosis not present

## 2019-07-03 DIAGNOSIS — M5432 Sciatica, left side: Secondary | ICD-10-CM | POA: Diagnosis not present

## 2019-08-08 DIAGNOSIS — M9903 Segmental and somatic dysfunction of lumbar region: Secondary | ICD-10-CM | POA: Diagnosis not present

## 2019-08-08 DIAGNOSIS — M9905 Segmental and somatic dysfunction of pelvic region: Secondary | ICD-10-CM | POA: Diagnosis not present

## 2019-08-08 DIAGNOSIS — M5431 Sciatica, right side: Secondary | ICD-10-CM | POA: Diagnosis not present

## 2019-08-08 DIAGNOSIS — M5432 Sciatica, left side: Secondary | ICD-10-CM | POA: Diagnosis not present

## 2019-08-27 DIAGNOSIS — R69 Illness, unspecified: Secondary | ICD-10-CM | POA: Diagnosis not present

## 2019-08-29 DIAGNOSIS — M9903 Segmental and somatic dysfunction of lumbar region: Secondary | ICD-10-CM | POA: Diagnosis not present

## 2019-08-29 DIAGNOSIS — M9905 Segmental and somatic dysfunction of pelvic region: Secondary | ICD-10-CM | POA: Diagnosis not present

## 2019-08-29 DIAGNOSIS — M5431 Sciatica, right side: Secondary | ICD-10-CM | POA: Diagnosis not present

## 2019-08-29 DIAGNOSIS — M5432 Sciatica, left side: Secondary | ICD-10-CM | POA: Diagnosis not present

## 2019-09-25 ENCOUNTER — Ambulatory Visit (INDEPENDENT_AMBULATORY_CARE_PROVIDER_SITE_OTHER): Payer: Medicare HMO | Admitting: Family Medicine

## 2019-09-25 ENCOUNTER — Other Ambulatory Visit: Payer: Self-pay

## 2019-09-25 ENCOUNTER — Encounter: Payer: Self-pay | Admitting: Family Medicine

## 2019-09-25 VITALS — BP 120/70 | HR 90 | Temp 97.0°F | Resp 16 | Ht 62.0 in | Wt 164.0 lb

## 2019-09-25 DIAGNOSIS — L821 Other seborrheic keratosis: Secondary | ICD-10-CM | POA: Diagnosis not present

## 2019-09-25 DIAGNOSIS — E7800 Pure hypercholesterolemia, unspecified: Secondary | ICD-10-CM

## 2019-09-25 DIAGNOSIS — Z1231 Encounter for screening mammogram for malignant neoplasm of breast: Secondary | ICD-10-CM | POA: Diagnosis not present

## 2019-09-25 NOTE — Progress Notes (Signed)
Subjective:    Patient ID: Taylor Singleton, female    DOB: 1956-06-02, 64 y.o.   MRN: [id number]  HPI Patient has not been seen since 2019.  She is overdue for mammogram and she consents to allow me to schedule this.  She is also overdue for fasting lab work given her history of hyperlipidemia.  I would like her to return fasting for this.  Her primary reason for coming to this office visit was seborrheic keratoses.  She has 4 large seborrheic keratoses on the lateral aspect of her left breast.  The exam was performed with a chaperone present.  Each of the seborrheic keratosis is 7 to 10 mm in diameter.  They have frequently getting irritated and the patient is scratching them due to having to wear a bra.  She also has a large 15 mm seborrheic keratosis on the superior aspect of her left gluteus that is irritated by her belt line.  She is requesting removal of each of these 5 lesions. Past Medical History:  Diagnosis Date  . HLD (hyperlipidemia)    Past Surgical History:  Procedure Laterality Date  . ABDOMINAL HYSTERECTOMY     due to bleeding TAH/BSO  . BREAST CYST EXCISION Left    Current Outpatient Medications on File Prior to Visit  Medication Sig Dispense Refill  . atorvastatin (LIPITOR) 20 MG tablet TAKE 1 TABLET BY MOUTH EVERY DAY 90 tablet 0  . calcium carbonate (CALCIUM 600) 600 MG TABS tablet Take 600 mg by mouth 2 (two) times daily with a meal.    . cholecalciferol (VITAMIN D) 1000 units tablet Take 1,000 Units by mouth daily.    . Lactobacillus-Inulin (PROBIOTIC DIGESTIVE SUPPORT PO) Take by mouth.     No current facility-administered medications on file prior to visit.   No Known Allergies Social History   Socioeconomic History  . Marital status: Married    Spouse name: Not on file  . Number of children: Not on file  . Years of education: Not on file  . Highest education level: Not on file  Occupational History  . Not on file  Tobacco Use  . Smoking status: Never  Smoker  . Smokeless tobacco: Never Used  Substance and Sexual Activity  . Alcohol use: No  . Drug use: No  . Sexual activity: Not on file  Other Topics Concern  . Not on file  Social History Narrative  . Not on file   Social Determinants of Health   Financial Resource Strain:   . Difficulty of Paying Living Expenses:   Food Insecurity:   . Worried About Charity fundraiser in the Last Year:   . Arboriculturist in the Last Year:   Transportation Needs:   . Film/video editor (Medical):   Marland Kitchen Lack of Transportation (Non-Medical):   Physical Activity:   . Days of Exercise per Week:   . Minutes of Exercise per Session:   Stress:   . Feeling of Stress :   Social Connections:   . Frequency of Communication with Friends and Family:   . Frequency of Social Gatherings with Friends and Family:   . Attends Religious Services:   . Active Member of Clubs or Organizations:   . Attends Archivist Meetings:   Marland Kitchen Marital Status:   Intimate Partner Violence:   . Fear of Current or Ex-Partner:   . Emotionally Abused:   Marland Kitchen Physically Abused:   . Sexually Abused:  Review of Systems     Objective:   Physical Exam  Constitutional: She appears well-developed and well-nourished.  HENT:  Right Ear: Tympanic membrane and ear canal normal.  Left Ear: Tympanic membrane and ear canal normal.  Mouth/Throat: No oropharyngeal exudate, posterior oropharyngeal edema or posterior oropharyngeal erythema.  Cardiovascular: Normal rate, regular rhythm and normal heart sounds.  Pulmonary/Chest: Effort normal. No respiratory distress. She has no wheezes. She has no rales.    Musculoskeletal:     Cervical back: Neck supple.       Back:  Lymphadenopathy:    She has no cervical adenopathy.          Assessment & Plan:  Encounter for screening mammogram for malignant neoplasm of breast - Plan: MM Digital Screening  Pure hypercholesterolemia - Plan: CBC with Differential/Platelet,  COMPLETE METABOLIC PANEL WITH GFR, Lipid panel  SK (seborrheic keratosis)  Each of the 5 seborrheic keratosis was treated with liquid nitrogen cryotherapy for a total of 20 seconds each.  The treatment was performed with a chaperone present.  I would like the patient return fasting for a CBC, CMP, and a fasting lipid panel.  I plan to schedule her for a mammogram.

## 2019-09-26 DIAGNOSIS — M5432 Sciatica, left side: Secondary | ICD-10-CM | POA: Diagnosis not present

## 2019-09-26 DIAGNOSIS — M9905 Segmental and somatic dysfunction of pelvic region: Secondary | ICD-10-CM | POA: Diagnosis not present

## 2019-09-26 DIAGNOSIS — M5431 Sciatica, right side: Secondary | ICD-10-CM | POA: Diagnosis not present

## 2019-09-26 DIAGNOSIS — M9903 Segmental and somatic dysfunction of lumbar region: Secondary | ICD-10-CM | POA: Diagnosis not present

## 2019-10-22 DIAGNOSIS — M9905 Segmental and somatic dysfunction of pelvic region: Secondary | ICD-10-CM | POA: Diagnosis not present

## 2019-10-22 DIAGNOSIS — M5432 Sciatica, left side: Secondary | ICD-10-CM | POA: Diagnosis not present

## 2019-10-22 DIAGNOSIS — M5431 Sciatica, right side: Secondary | ICD-10-CM | POA: Diagnosis not present

## 2019-10-22 DIAGNOSIS — M9903 Segmental and somatic dysfunction of lumbar region: Secondary | ICD-10-CM | POA: Diagnosis not present

## 2019-11-13 ENCOUNTER — Ambulatory Visit
Admission: RE | Admit: 2019-11-13 | Discharge: 2019-11-13 | Disposition: A | Discharge status: Home / Self Care | Payer: BLUE CROSS/BLUE SHIELD | Source: Ambulatory Visit | Attending: Family Medicine | Admitting: Family Medicine

## 2019-11-13 ENCOUNTER — Other Ambulatory Visit: Payer: Self-pay

## 2019-11-13 DIAGNOSIS — Z1231 Encounter for screening mammogram for malignant neoplasm of breast: Secondary | ICD-10-CM

## 2019-11-19 DIAGNOSIS — M9905 Segmental and somatic dysfunction of pelvic region: Secondary | ICD-10-CM | POA: Diagnosis not present

## 2019-11-19 DIAGNOSIS — M5431 Sciatica, right side: Secondary | ICD-10-CM | POA: Diagnosis not present

## 2019-11-19 DIAGNOSIS — M5432 Sciatica, left side: Secondary | ICD-10-CM | POA: Diagnosis not present

## 2019-11-19 DIAGNOSIS — M9903 Segmental and somatic dysfunction of lumbar region: Secondary | ICD-10-CM | POA: Diagnosis not present

## 2019-12-04 DIAGNOSIS — M9905 Segmental and somatic dysfunction of pelvic region: Secondary | ICD-10-CM | POA: Diagnosis not present

## 2019-12-04 DIAGNOSIS — M9903 Segmental and somatic dysfunction of lumbar region: Secondary | ICD-10-CM | POA: Diagnosis not present

## 2019-12-04 DIAGNOSIS — M5431 Sciatica, right side: Secondary | ICD-10-CM | POA: Diagnosis not present

## 2019-12-04 DIAGNOSIS — M5432 Sciatica, left side: Secondary | ICD-10-CM | POA: Diagnosis not present

## 2020-02-04 DIAGNOSIS — M5431 Sciatica, right side: Secondary | ICD-10-CM | POA: Diagnosis not present

## 2020-02-04 DIAGNOSIS — M9905 Segmental and somatic dysfunction of pelvic region: Secondary | ICD-10-CM | POA: Diagnosis not present

## 2020-02-04 DIAGNOSIS — M5432 Sciatica, left side: Secondary | ICD-10-CM | POA: Diagnosis not present

## 2020-02-04 DIAGNOSIS — M9903 Segmental and somatic dysfunction of lumbar region: Secondary | ICD-10-CM | POA: Diagnosis not present

## 2020-02-12 DIAGNOSIS — M5431 Sciatica, right side: Secondary | ICD-10-CM | POA: Diagnosis not present

## 2020-02-12 DIAGNOSIS — M9903 Segmental and somatic dysfunction of lumbar region: Secondary | ICD-10-CM | POA: Diagnosis not present

## 2020-02-12 DIAGNOSIS — M5432 Sciatica, left side: Secondary | ICD-10-CM | POA: Diagnosis not present

## 2020-02-12 DIAGNOSIS — M9905 Segmental and somatic dysfunction of pelvic region: Secondary | ICD-10-CM | POA: Diagnosis not present

## 2020-02-25 DIAGNOSIS — H524 Presbyopia: Secondary | ICD-10-CM | POA: Diagnosis not present

## 2020-02-27 DIAGNOSIS — M5432 Sciatica, left side: Secondary | ICD-10-CM | POA: Diagnosis not present

## 2020-02-27 DIAGNOSIS — M5431 Sciatica, right side: Secondary | ICD-10-CM | POA: Diagnosis not present

## 2020-02-27 DIAGNOSIS — M9903 Segmental and somatic dysfunction of lumbar region: Secondary | ICD-10-CM | POA: Diagnosis not present

## 2020-02-27 DIAGNOSIS — M9905 Segmental and somatic dysfunction of pelvic region: Secondary | ICD-10-CM | POA: Diagnosis not present

## 2020-03-03 DIAGNOSIS — M9905 Segmental and somatic dysfunction of pelvic region: Secondary | ICD-10-CM | POA: Diagnosis not present

## 2020-03-03 DIAGNOSIS — R69 Illness, unspecified: Secondary | ICD-10-CM | POA: Diagnosis not present

## 2020-03-03 DIAGNOSIS — M5431 Sciatica, right side: Secondary | ICD-10-CM | POA: Diagnosis not present

## 2020-03-03 DIAGNOSIS — M5432 Sciatica, left side: Secondary | ICD-10-CM | POA: Diagnosis not present

## 2020-03-03 DIAGNOSIS — M9903 Segmental and somatic dysfunction of lumbar region: Secondary | ICD-10-CM | POA: Diagnosis not present

## 2020-03-10 DIAGNOSIS — M9905 Segmental and somatic dysfunction of pelvic region: Secondary | ICD-10-CM | POA: Diagnosis not present

## 2020-03-10 DIAGNOSIS — M9903 Segmental and somatic dysfunction of lumbar region: Secondary | ICD-10-CM | POA: Diagnosis not present

## 2020-03-10 DIAGNOSIS — M5431 Sciatica, right side: Secondary | ICD-10-CM | POA: Diagnosis not present

## 2020-03-10 DIAGNOSIS — M5432 Sciatica, left side: Secondary | ICD-10-CM | POA: Diagnosis not present

## 2020-03-17 DIAGNOSIS — M5432 Sciatica, left side: Secondary | ICD-10-CM | POA: Diagnosis not present

## 2020-03-17 DIAGNOSIS — M5431 Sciatica, right side: Secondary | ICD-10-CM | POA: Diagnosis not present

## 2020-03-17 DIAGNOSIS — M9903 Segmental and somatic dysfunction of lumbar region: Secondary | ICD-10-CM | POA: Diagnosis not present

## 2020-03-17 DIAGNOSIS — M9905 Segmental and somatic dysfunction of pelvic region: Secondary | ICD-10-CM | POA: Diagnosis not present

## 2020-03-24 DIAGNOSIS — M9905 Segmental and somatic dysfunction of pelvic region: Secondary | ICD-10-CM | POA: Diagnosis not present

## 2020-03-24 DIAGNOSIS — M9903 Segmental and somatic dysfunction of lumbar region: Secondary | ICD-10-CM | POA: Diagnosis not present

## 2020-03-24 DIAGNOSIS — M5431 Sciatica, right side: Secondary | ICD-10-CM | POA: Diagnosis not present

## 2020-03-24 DIAGNOSIS — M5432 Sciatica, left side: Secondary | ICD-10-CM | POA: Diagnosis not present

## 2020-03-31 DIAGNOSIS — M5431 Sciatica, right side: Secondary | ICD-10-CM | POA: Diagnosis not present

## 2020-03-31 DIAGNOSIS — M9903 Segmental and somatic dysfunction of lumbar region: Secondary | ICD-10-CM | POA: Diagnosis not present

## 2020-03-31 DIAGNOSIS — M9905 Segmental and somatic dysfunction of pelvic region: Secondary | ICD-10-CM | POA: Diagnosis not present

## 2020-03-31 DIAGNOSIS — M5432 Sciatica, left side: Secondary | ICD-10-CM | POA: Diagnosis not present

## 2020-04-21 DIAGNOSIS — M5431 Sciatica, right side: Secondary | ICD-10-CM | POA: Diagnosis not present

## 2020-04-21 DIAGNOSIS — M9905 Segmental and somatic dysfunction of pelvic region: Secondary | ICD-10-CM | POA: Diagnosis not present

## 2020-04-21 DIAGNOSIS — M5432 Sciatica, left side: Secondary | ICD-10-CM | POA: Diagnosis not present

## 2020-04-21 DIAGNOSIS — M9903 Segmental and somatic dysfunction of lumbar region: Secondary | ICD-10-CM | POA: Diagnosis not present

## 2020-05-06 DIAGNOSIS — M9905 Segmental and somatic dysfunction of pelvic region: Secondary | ICD-10-CM | POA: Diagnosis not present

## 2020-05-06 DIAGNOSIS — M9903 Segmental and somatic dysfunction of lumbar region: Secondary | ICD-10-CM | POA: Diagnosis not present

## 2020-05-06 DIAGNOSIS — M5431 Sciatica, right side: Secondary | ICD-10-CM | POA: Diagnosis not present

## 2020-05-06 DIAGNOSIS — M5432 Sciatica, left side: Secondary | ICD-10-CM | POA: Diagnosis not present

## 2020-05-20 DIAGNOSIS — M5431 Sciatica, right side: Secondary | ICD-10-CM | POA: Diagnosis not present

## 2020-05-20 DIAGNOSIS — M9903 Segmental and somatic dysfunction of lumbar region: Secondary | ICD-10-CM | POA: Diagnosis not present

## 2020-05-20 DIAGNOSIS — M5432 Sciatica, left side: Secondary | ICD-10-CM | POA: Diagnosis not present

## 2020-05-20 DIAGNOSIS — M9905 Segmental and somatic dysfunction of pelvic region: Secondary | ICD-10-CM | POA: Diagnosis not present

## 2020-06-02 DIAGNOSIS — M5432 Sciatica, left side: Secondary | ICD-10-CM | POA: Diagnosis not present

## 2020-06-02 DIAGNOSIS — M9905 Segmental and somatic dysfunction of pelvic region: Secondary | ICD-10-CM | POA: Diagnosis not present

## 2020-06-02 DIAGNOSIS — M5431 Sciatica, right side: Secondary | ICD-10-CM | POA: Diagnosis not present

## 2020-06-02 DIAGNOSIS — M9903 Segmental and somatic dysfunction of lumbar region: Secondary | ICD-10-CM | POA: Diagnosis not present

## 2020-06-16 ENCOUNTER — Other Ambulatory Visit: Payer: Self-pay | Admitting: *Deleted

## 2020-06-16 ENCOUNTER — Telehealth: Payer: Self-pay

## 2020-06-16 DIAGNOSIS — M9903 Segmental and somatic dysfunction of lumbar region: Secondary | ICD-10-CM | POA: Diagnosis not present

## 2020-06-16 DIAGNOSIS — M5431 Sciatica, right side: Secondary | ICD-10-CM | POA: Diagnosis not present

## 2020-06-16 DIAGNOSIS — E7800 Pure hypercholesterolemia, unspecified: Secondary | ICD-10-CM

## 2020-06-16 DIAGNOSIS — Z13220 Encounter for screening for lipoid disorders: Secondary | ICD-10-CM

## 2020-06-16 DIAGNOSIS — Z136 Encounter for screening for cardiovascular disorders: Secondary | ICD-10-CM

## 2020-06-16 DIAGNOSIS — M9905 Segmental and somatic dysfunction of pelvic region: Secondary | ICD-10-CM | POA: Diagnosis not present

## 2020-06-16 DIAGNOSIS — M5432 Sciatica, left side: Secondary | ICD-10-CM | POA: Diagnosis not present

## 2020-06-16 NOTE — Telephone Encounter (Signed)
Pt is wanting to come in for full blood work at 9:15 Tuesday need blood work ordered.

## 2020-06-16 NOTE — Telephone Encounter (Signed)
Future orders placed 

## 2020-06-17 ENCOUNTER — Other Ambulatory Visit: Payer: Medicare HMO

## 2020-06-17 ENCOUNTER — Other Ambulatory Visit: Payer: Self-pay

## 2020-06-17 DIAGNOSIS — E7800 Pure hypercholesterolemia, unspecified: Secondary | ICD-10-CM

## 2020-06-17 DIAGNOSIS — Z13220 Encounter for screening for lipoid disorders: Secondary | ICD-10-CM

## 2020-06-17 DIAGNOSIS — Z136 Encounter for screening for cardiovascular disorders: Secondary | ICD-10-CM | POA: Diagnosis not present

## 2020-06-17 LAB — COMPLETE METABOLIC PANEL WITH GFR
AG Ratio: 1.7 (calc) (ref 1.0–2.5)
ALT: 32 U/L — ABNORMAL HIGH (ref 6–29)
AST: 21 U/L (ref 10–35)
Albumin: 4.4 g/dL (ref 3.6–5.1)
Alkaline phosphatase (APISO): 77 U/L (ref 37–153)
BUN: 23 mg/dL (ref 7–25)
CO2: 28 mmol/L (ref 20–32)
Calcium: 10 mg/dL (ref 8.6–10.4)
Chloride: 104 mmol/L (ref 98–110)
Creat: 0.77 mg/dL (ref 0.50–0.99)
GFR, Est African American: 93 mL/min/{1.73_m2} (ref >=60)
GFR, Est Non African American: 80 mL/min/{1.73_m2} (ref >=60)
Globulin: 2.6 g/dL (calc) (ref 1.9–3.7)
Glucose, Bld: 102 mg/dL — ABNORMAL HIGH (ref 65–99)
Potassium: 4.5 mmol/L (ref 3.5–5.3)
Sodium: 140 mmol/L (ref 135–146)
Total Bilirubin: 0.9 mg/dL (ref 0.2–1.2)
Total Protein: 7 g/dL (ref 6.1–8.1)

## 2020-06-17 LAB — CBC WITH DIFFERENTIAL/PLATELET
Absolute Monocytes: 561 cells/uL (ref 200–950)
Basophils Absolute: 79 cells/uL (ref 0–200)
Basophils Relative: 1.2 %
Eosinophils Absolute: 290 cells/uL (ref 15–500)
Eosinophils Relative: 4.4 %
HCT: 44.9 % (ref 35.0–45.0)
Hemoglobin: 15.3 g/dL (ref 11.7–15.5)
Lymphs Abs: 1980 cells/uL (ref 850–3900)
MCH: 29.9 pg (ref 27.0–33.0)
MCHC: 34.1 g/dL (ref 32.0–36.0)
MCV: 87.9 fL (ref 80.0–100.0)
MPV: 11 fL (ref 7.5–12.5)
Monocytes Relative: 8.5 %
Neutro Abs: 3689 cells/uL (ref 1500–7800)
Neutrophils Relative %: 55.9 %
Platelets: 337 10*3/uL (ref 140–400)
RBC: 5.11 10*6/uL — ABNORMAL HIGH (ref 3.80–5.10)
RDW: 12.1 % (ref 11.0–15.0)
Total Lymphocyte: 30 %
WBC: 6.6 10*3/uL (ref 3.8–10.8)

## 2020-06-17 LAB — LIPID PANEL
Cholesterol: 189 mg/dL (ref <200)
HDL: 55 mg/dL (ref >=50)
LDL Cholesterol (Calc): 111 mg/dL (calc) — ABNORMAL HIGH
Non-HDL Cholesterol (Calc): 134 mg/dL (calc) — ABNORMAL HIGH (ref <130)
Total CHOL/HDL Ratio: 3.4 (calc) (ref <5.0)
Triglycerides: 122 mg/dL (ref <150)

## 2020-06-27 ENCOUNTER — Telehealth: Payer: Self-pay

## 2020-06-27 NOTE — Telephone Encounter (Signed)
Spoke with pt, took home test, she is + of covid. Pt is having all the usual sx. Discussed otc medication for the sx, verbalized understanding.

## 2020-06-30 ENCOUNTER — Ambulatory Visit: Payer: Self-pay | Admitting: Family Medicine

## 2020-07-08 ENCOUNTER — Ambulatory Visit: Payer: Self-pay | Admitting: Family Medicine

## 2020-07-08 DIAGNOSIS — M5432 Sciatica, left side: Secondary | ICD-10-CM | POA: Diagnosis not present

## 2020-07-08 DIAGNOSIS — M9905 Segmental and somatic dysfunction of pelvic region: Secondary | ICD-10-CM | POA: Diagnosis not present

## 2020-07-08 DIAGNOSIS — M9903 Segmental and somatic dysfunction of lumbar region: Secondary | ICD-10-CM | POA: Diagnosis not present

## 2020-07-08 DIAGNOSIS — M5431 Sciatica, right side: Secondary | ICD-10-CM | POA: Diagnosis not present

## 2020-07-10 ENCOUNTER — Other Ambulatory Visit: Payer: Self-pay

## 2020-07-10 ENCOUNTER — Ambulatory Visit (INDEPENDENT_AMBULATORY_CARE_PROVIDER_SITE_OTHER): Payer: Medicare HMO | Admitting: Family Medicine

## 2020-07-10 VITALS — BP 136/80 | HR 83 | Temp 97.2°F | Ht 61.0 in | Wt 168.0 lb

## 2020-07-10 DIAGNOSIS — N816 Rectocele: Secondary | ICD-10-CM | POA: Diagnosis not present

## 2020-07-10 DIAGNOSIS — E7800 Pure hypercholesterolemia, unspecified: Secondary | ICD-10-CM | POA: Diagnosis not present

## 2020-07-10 MED ORDER — ATORVASTATIN CALCIUM 20 MG PO TABS: 20.0000 mg | ORAL_TABLET | Freq: Every day | ORAL | 3 refills | Status: DC

## 2020-07-10 NOTE — Progress Notes (Signed)
Subjective:    Patient ID: Taylor Singleton, female    DOB: 07-23-1955, 65 y.o.   MRN: [id number]  HPI Patient has been off her cholesterol medicine for almost 1 year.  Her most recent lab work is listed below: Appointment on 06/17/2020  Component Date Value Ref Range Status  . Cholesterol 06/17/2020 189  <200 mg/dL Final  . HDL 67/67/2094 55  > OR = 50 mg/dL Final  . Triglycerides 06/17/2020 122  <150 mg/dL Final  . LDL Cholesterol (Calc) 06/17/2020 111* mg/dL (calc) Final   Comment: Reference range: <100 . Desirable range <100 mg/dL for primary prevention;   <70 mg/dL for patients with CHD or diabetic patients  with > or = 2 CHD risk factors. Marland Kitchen LDL-C is now calculated using the Martin-Hopkins  calculation, which is a validated novel method providing  better accuracy than the Friedewald equation in the  estimation of LDL-C.  Horald Pollen et al. Lenox Ahr. 7096;283(66): 2061-2068  (http://education.[facility].com/faq/FAQ164)   . Total CHOL/HDL Ratio 06/17/2020 3.4  <2.9 (calc) Final  . Non-HDL Cholesterol (Calc) 06/17/2020 134* <130 mg/dL (calc) Final   Comment: For patients with diabetes plus 1 major ASCVD risk  factor, treating to a non-HDL-C goal of <100 mg/dL  (LDL-C of <47 mg/dL) is considered a therapeutic  option.   . Glucose, Bld 06/17/2020 102* 65 - 99 mg/dL Final   Comment: .            Fasting reference interval . For someone without known diabetes, a glucose value between 100 and 125 mg/dL is consistent with prediabetes and should be confirmed with a follow-up test. .   . BUN 06/17/2020 23  7 - 25 mg/dL Final  . Creat 65/46/5035 0.77  0.50 - 0.99 mg/dL Final   Comment: For patients >56 years of age, the reference limit for Creatinine is approximately 13% higher for people identified as African-American. .   . GFR, Est Non African American 06/17/2020 80  > OR = 60 mL/min/1.46m2 Final  . GFR, Est African American 06/17/2020 93  > OR = 60 mL/min/1.67m2 Final  .  BUN/Creatinine Ratio 06/17/2020 NOT APPLICABLE  6 - 22 (calc) Final  . Sodium 06/17/2020 140  135 - 146 mmol/L Final  . Potassium 06/17/2020 4.5  3.5 - 5.3 mmol/L Final  . Chloride 06/17/2020 104  98 - 110 mmol/L Final  . CO2 06/17/2020 28  20 - 32 mmol/L Final  . Calcium 06/17/2020 10.0  8.6 - 10.4 mg/dL Final  . Total Protein 06/17/2020 7.0  6.1 - 8.1 g/dL Final  . Albumin 46/56/8127 4.4  3.6 - 5.1 g/dL Final  . Globulin 51/70/0174 2.6  1.9 - 3.7 g/dL (calc) Final  . AG Ratio 06/17/2020 1.7  1.0 - 2.5 (calc) Final  . Total Bilirubin 06/17/2020 0.9  0.2 - 1.2 mg/dL Final  . Alkaline phosphatase (APISO) 06/17/2020 77  37 - 153 U/L Final  . AST 06/17/2020 21  10 - 35 U/L Final  . ALT 06/17/2020 32* 6 - 29 U/L Final  . WBC 06/17/2020 6.6  3.8 - 10.8 Thousand/uL Final  . RBC 06/17/2020 5.11* 3.80 - 5.10 Million/uL Final  . Hemoglobin 06/17/2020 15.3  11.7 - 15.5 g/dL Final  . HCT 94/49/6759 44.9  35.0 - 45.0 % Final  . MCV 06/17/2020 87.9  80.0 - 100.0 fL Final  . MCH 06/17/2020 29.9  27.0 - 33.0 pg Final  . MCHC 06/17/2020 34.1  32.0 - 36.0 g/dL Final  . RDW  06/17/2020 12.1  11.0 - 15.0 % Final  . Platelets 06/17/2020 337  140 - 400 Thousand/uL Final  . MPV 06/17/2020 11.0  7.5 - 12.5 fL Final  . Neutro Abs 06/17/2020 3,689  1,500 - 7,800 cells/uL Final  . Lymphs Abs 06/17/2020 1,980  850 - 3,900 cells/uL Final  . Absolute Monocytes 06/17/2020 561  200 - 950 cells/uL Final  . Eosinophils Absolute 06/17/2020 290  15 - 500 cells/uL Final  . Basophils Absolute 06/17/2020 79  0 - 200 cells/uL Final  . Neutrophils Relative % 06/17/2020 55.9  % Final  . Total Lymphocyte 06/17/2020 30.0  % Final  . Monocytes Relative 06/17/2020 8.5  % Final  . Eosinophils Relative 06/17/2020 4.4  % Final  . Basophils Relative 06/17/2020 1.2  % Final   I calculate her 10-year risk of cardiovascular disease to be 7.6%.  Her brother died of a stroke in his 26s or massive heart attack but it was definitely a  cardiovascular event that happened suddenly.  Her mother had cardiovascular events throughout her later years and died in her 37s.  Therefore she does have a family history of cardiovascular disease.  We discussed the risk and benefits of taking Lipitor and she would like to resume the medication.  She also reports feeling like her rectum bulges into her vagina.  Sometimes she has to put her finger in her vagina to help evacuate her stool.  She denies any melena or hematochezia.  She was told in the past that she had a rectocele. Past Medical History:  Diagnosis Date  . HLD (hyperlipidemia)    Past Surgical History:  Procedure Laterality Date  . ABDOMINAL HYSTERECTOMY     due to bleeding TAH/BSO  . BREAST CYST EXCISION Left    Current Outpatient Medications on File Prior to Visit  Medication Sig Dispense Refill  . ELDERBERRY PO Take by mouth.    . Turmeric (QC TUMERIC COMPLEX PO) Take by mouth.    . calcium carbonate (CALCIUM 600) 600 MG TABS tablet Take 600 mg by mouth 2 (two) times daily with a meal.    . cholecalciferol (VITAMIN D) 1000 units tablet Take 1,000 Units by mouth daily.    . Lactobacillus-Inulin (PROBIOTIC DIGESTIVE SUPPORT PO) Take by mouth.     No current facility-administered medications on file prior to visit.   No Known Allergies Social History   Socioeconomic History  . Marital status: Married    Spouse name: Not on file  . Number of children: Not on file  . Years of education: Not on file  . Highest education level: Not on file  Occupational History  . Not on file  Tobacco Use  . Smoking status: Never Smoker  . Smokeless tobacco: Never Used  Substance and Sexual Activity  . Alcohol use: No  . Drug use: No  . Sexual activity: Not on file  Other Topics Concern  . Not on file  Social History Narrative  . Not on file   Social Determinants of Health   Financial Resource Strain: Not on file  Food Insecurity: Not on file  Transportation Needs: Not on  file  Physical Activity: Not on file  Stress: Not on file  Social Connections: Not on file  Intimate Partner Violence: Not on file     Review of Systems     Objective:   Physical Exam Constitutional:      Appearance: She is well-developed.  HENT:     Right Ear: Tympanic  membrane and ear canal normal.     Left Ear: Tympanic membrane and ear canal normal.     Mouth/Throat:     Pharynx: No oropharyngeal exudate or posterior oropharyngeal erythema.  Cardiovascular:     Rate and Rhythm: Normal rate and regular rhythm.     Heart sounds: Normal heart sounds.  Pulmonary:     Effort: Pulmonary effort is normal. No respiratory distress.     Breath sounds: No wheezing or rales.  Musculoskeletal:     Cervical back: Neck supple.  Lymphadenopathy:     Cervical: No cervical adenopathy.           Assessment & Plan:  Rectocele - Plan: Ambulatory referral to Gynecology  Pure hypercholesterolemia  We discussed the 10-year risk of 7.6%.  With optimal risk factors it decreases to 5%.  That and her family history has commenced the patient to take Lipitor 20 mg a day.  Recheck labs in 6 months.  Patient also would like to see GYN regarding her rectocele which I will set up for her.

## 2020-07-22 DIAGNOSIS — M9903 Segmental and somatic dysfunction of lumbar region: Secondary | ICD-10-CM | POA: Diagnosis not present

## 2020-07-22 DIAGNOSIS — M9905 Segmental and somatic dysfunction of pelvic region: Secondary | ICD-10-CM | POA: Diagnosis not present

## 2020-07-22 DIAGNOSIS — M5432 Sciatica, left side: Secondary | ICD-10-CM | POA: Diagnosis not present

## 2020-07-22 DIAGNOSIS — M5431 Sciatica, right side: Secondary | ICD-10-CM | POA: Diagnosis not present

## 2020-07-29 DIAGNOSIS — M9905 Segmental and somatic dysfunction of pelvic region: Secondary | ICD-10-CM | POA: Diagnosis not present

## 2020-07-29 DIAGNOSIS — M9903 Segmental and somatic dysfunction of lumbar region: Secondary | ICD-10-CM | POA: Diagnosis not present

## 2020-07-29 DIAGNOSIS — M5432 Sciatica, left side: Secondary | ICD-10-CM | POA: Diagnosis not present

## 2020-07-29 DIAGNOSIS — M5431 Sciatica, right side: Secondary | ICD-10-CM | POA: Diagnosis not present

## 2020-08-11 ENCOUNTER — Other Ambulatory Visit: Payer: Self-pay

## 2020-08-11 ENCOUNTER — Ambulatory Visit (INDEPENDENT_AMBULATORY_CARE_PROVIDER_SITE_OTHER): Payer: Medicare HMO | Admitting: Family Medicine

## 2020-08-11 VITALS — BP 132/74 | HR 92 | Temp 98.0°F | Resp 16 | Ht 61.0 in | Wt 166.0 lb

## 2020-08-11 DIAGNOSIS — G44209 Tension-type headache, unspecified, not intractable: Secondary | ICD-10-CM

## 2020-08-11 DIAGNOSIS — M5432 Sciatica, left side: Secondary | ICD-10-CM | POA: Diagnosis not present

## 2020-08-11 DIAGNOSIS — R42 Dizziness and giddiness: Secondary | ICD-10-CM

## 2020-08-11 DIAGNOSIS — M9905 Segmental and somatic dysfunction of pelvic region: Secondary | ICD-10-CM | POA: Diagnosis not present

## 2020-08-11 DIAGNOSIS — F439 Reaction to severe stress, unspecified: Secondary | ICD-10-CM | POA: Diagnosis not present

## 2020-08-11 DIAGNOSIS — M5431 Sciatica, right side: Secondary | ICD-10-CM | POA: Diagnosis not present

## 2020-08-11 DIAGNOSIS — M9903 Segmental and somatic dysfunction of lumbar region: Secondary | ICD-10-CM | POA: Diagnosis not present

## 2020-08-11 DIAGNOSIS — R69 Illness, unspecified: Secondary | ICD-10-CM | POA: Diagnosis not present

## 2020-08-11 MED ORDER — ALPRAZOLAM 0.5 MG PO TABS: 0.5000 mg | ORAL_TABLET | Freq: Three times a day (TID) | ORAL | 0 refills | Status: DC | PRN

## 2020-08-11 NOTE — Progress Notes (Signed)
Subjective:    Patient ID: Taylor Singleton, female    DOB: 1956/04/13, 65 y.o.   MRN: [id number]  Hyperlipidemia   Patient is a 65 female who is here today complaining of disequilibrium.  She states that over the last week, if she looks up or turns her head suddenly she will feel movement like the room is moving around her.  She will feel dizzy.  She denies any syncope or near syncope or tachyarrhythmias or shortness of breath.  She has a positive Dix-Hallpike maneuver today.  She denies any ear pain or decreased hearing.  She does have bitemporal headaches which are pressure-like in nature.  She denies any vision changes.  She states that she is under a lot more stress as her family is living with her mother building their house.  This has her "on edge".  Several people have told her that she needs to drink a glass of wine at night to relax or take something for stress.  I do believe she is having tension headaches.  There is no pain or pressure in her sinuses today.  There is no rhinorrhea or congestion. Past Medical History:  Diagnosis Date  . HLD (hyperlipidemia)    Past Surgical History:  Procedure Laterality Date  . ABDOMINAL HYSTERECTOMY     due to bleeding TAH/BSO  . BREAST CYST EXCISION Left    Current Outpatient Medications on File Prior to Visit  Medication Sig Dispense Refill  . atorvastatin (LIPITOR) 20 MG tablet Take 1 tablet (20 mg total) by mouth daily. 90 tablet 3  . calcium carbonate (OS-CAL) 600 MG TABS tablet Take 600 mg by mouth 2 (two) times daily with a meal.    . cholecalciferol (VITAMIN D) 1000 units tablet Take 1,000 Units by mouth daily.    Marland Kitchen ELDERBERRY PO Take by mouth.    . Lactobacillus-Inulin (PROBIOTIC DIGESTIVE SUPPORT PO) Take by mouth.    . Turmeric (QC TUMERIC COMPLEX PO) Take by mouth. (Patient not taking: Reported on 08/11/2020)     No current facility-administered medications on file prior to visit.   No Known Allergies Social History    Socioeconomic History  . Marital status: Married    Spouse name: Not on file  . Number of children: Not on file  . Years of education: Not on file  . Highest education level: Not on file  Occupational History  . Not on file  Tobacco Use  . Smoking status: Never Smoker  . Smokeless tobacco: Never Used  Substance and Sexual Activity  . Alcohol use: No  . Drug use: No  . Sexual activity: Not on file  Other Topics Concern  . Not on file  Social History Narrative  . Not on file   Social Determinants of Health   Financial Resource Strain: Not on file  Food Insecurity: Not on file  Transportation Needs: Not on file  Physical Activity: Not on file  Stress: Not on file  Social Connections: Not on file  Intimate Partner Violence: Not on file     Review of Systems     Objective:   Physical Exam Constitutional:      Appearance: She is well-developed.  HENT:     Right Ear: Tympanic membrane and ear canal normal.     Left Ear: Tympanic membrane and ear canal normal.     Mouth/Throat:     Pharynx: No oropharyngeal exudate or posterior oropharyngeal erythema.  Cardiovascular:     Rate and Rhythm: Normal  rate and regular rhythm.     Heart sounds: Normal heart sounds.  Pulmonary:     Effort: Pulmonary effort is normal. No respiratory distress.     Breath sounds: No wheezing or rales.  Musculoskeletal:     Cervical back: Neck supple.  Lymphadenopathy:     Cervical: No cervical adenopathy.           Assessment & Plan:  Vertigo  Tension headache  Situational stress  I believe the patient is having vertigo.  I explained the cause of vertigo and we have decided to wait 1 to 2 weeks to see if it will subside on its own.  Consider meclizine if worsening.  I believe she is having tension headaches.  I do not believe she has a sinus infection.  If she develops warning signs such as neurologic deficits, we will proceed with neuroimaging however at the present time I think  if we dealt with her stress more effectively the headaches would improve.  In that regard, we have decided to use Xanax 0.5 mg every 8 hours as needed but to be used sparingly.  Reassess in 1 month or sooner if worsening

## 2020-08-18 ENCOUNTER — Ambulatory Visit (INDEPENDENT_AMBULATORY_CARE_PROVIDER_SITE_OTHER): Payer: Medicare HMO | Admitting: Family Medicine

## 2020-08-18 DIAGNOSIS — H538 Other visual disturbances: Secondary | ICD-10-CM | POA: Diagnosis not present

## 2020-08-18 DIAGNOSIS — R110 Nausea: Secondary | ICD-10-CM

## 2020-08-18 DIAGNOSIS — R42 Dizziness and giddiness: Secondary | ICD-10-CM | POA: Diagnosis not present

## 2020-08-18 DIAGNOSIS — R519 Headache, unspecified: Secondary | ICD-10-CM

## 2020-08-18 NOTE — Progress Notes (Signed)
Subjective:    Patient ID: Taylor Singleton, female    DOB: Dec 20, 1955, 65 y.o.   MRN: [id number]  HPI Patient is a very pleasant 65 year old Caucasian female being seen today as a telephone visit.  Phone call began at 1044.  Phone call concluded at 1054.  Patient consents to be seen via telephone.  Please see my office visit a few weeks ago.  The patient was having vertigo.  She continues to have vertigo first thing in the morning.  Whenever she gets out of bed, she feels very lightheaded.  However the room is not spinning.  Is more of a disequilibrium.  She also is having consistent pressure behind her eyes.  She denies any sinus pain.  She denies any rhinorrhea.  She denies any dental pain.  She is also developing daily nausea.  She is not vomiting.  She had an eye exam 7 months ago that was normal.  Previously I thought the patient was experiencing tension headaches however she denies any pressure-like sensation in the temples of her head now.  Is more of a constant pressure behind her eyes.  She is also developing some blurry vision along with a sense of disequilibrium and now nausea. Past Medical History:  Diagnosis Date  . HLD (hyperlipidemia)    Past Surgical History:  Procedure Laterality Date  . ABDOMINAL HYSTERECTOMY     due to bleeding TAH/BSO  . BREAST CYST EXCISION Left    Current Outpatient Medications on File Prior to Visit  Medication Sig Dispense Refill  . ALPRAZolam (XANAX) 0.5 MG tablet Take 1 tablet (0.5 mg total) by mouth 3 (three) times daily as needed. 30 tablet 0  . atorvastatin (LIPITOR) 20 MG tablet Take 1 tablet (20 mg total) by mouth daily. 90 tablet 3  . calcium carbonate (OS-CAL) 600 MG TABS tablet Take 600 mg by mouth 2 (two) times daily with a meal.    . cholecalciferol (VITAMIN D) 1000 units tablet Take 1,000 Units by mouth daily.    Marland Kitchen ELDERBERRY PO Take by mouth.    . Lactobacillus-Inulin (PROBIOTIC DIGESTIVE SUPPORT PO) Take by mouth.    . Turmeric (QC  TUMERIC COMPLEX PO) Take by mouth. (Patient not taking: Reported on 08/11/2020)     No current facility-administered medications on file prior to visit.   No Known Allergies Social History   Socioeconomic History  . Marital status: Married    Spouse name: Not on file  . Number of children: Not on file  . Years of education: Not on file  . Highest education level: Not on file  Occupational History  . Not on file  Tobacco Use  . Smoking status: Never Smoker  . Smokeless tobacco: Never Used  Substance and Sexual Activity  . Alcohol use: No  . Drug use: No  . Sexual activity: Not on file  Other Topics Concern  . Not on file  Social History Narrative  . Not on file   Social Determinants of Health   Financial Resource Strain: Not on file  Food Insecurity: Not on file  Transportation Needs: Not on file  Physical Activity: Not on file  Stress: Not on file  Social Connections: Not on file  Intimate Partner Violence: Not on file      Review of Systems  All other systems reviewed and are negative.      Objective:   Physical Exam        Assessment & Plan:  Dysequilibrium - Plan: CT Head  Wo Contrast  Pressure in head - Plan: CT Head Wo Contrast  Blurry vision - Plan: CT Head Wo Contrast  Nausea - Plan: CT Head Wo Contrast  Symptoms have been going on now consistently and seem to be getting worse.  Her constellation of symptoms is concerning for a space-occupying lesion in the brain.  She is having disequilibrium, pressure-like sensation in the head, blurry vision, and nausea on a daily basis.  Therefore I would like to obtain a CT scan of the head to evaluate further.

## 2020-08-19 DIAGNOSIS — M5431 Sciatica, right side: Secondary | ICD-10-CM | POA: Diagnosis not present

## 2020-08-19 DIAGNOSIS — M9901 Segmental and somatic dysfunction of cervical region: Secondary | ICD-10-CM | POA: Diagnosis not present

## 2020-08-19 DIAGNOSIS — M9903 Segmental and somatic dysfunction of lumbar region: Secondary | ICD-10-CM | POA: Diagnosis not present

## 2020-08-20 DIAGNOSIS — M9901 Segmental and somatic dysfunction of cervical region: Secondary | ICD-10-CM | POA: Diagnosis not present

## 2020-08-20 DIAGNOSIS — M5431 Sciatica, right side: Secondary | ICD-10-CM | POA: Diagnosis not present

## 2020-08-20 DIAGNOSIS — M9903 Segmental and somatic dysfunction of lumbar region: Secondary | ICD-10-CM | POA: Diagnosis not present

## 2020-08-21 DIAGNOSIS — M5431 Sciatica, right side: Secondary | ICD-10-CM | POA: Diagnosis not present

## 2020-08-21 DIAGNOSIS — M9901 Segmental and somatic dysfunction of cervical region: Secondary | ICD-10-CM | POA: Diagnosis not present

## 2020-08-21 DIAGNOSIS — M9903 Segmental and somatic dysfunction of lumbar region: Secondary | ICD-10-CM | POA: Diagnosis not present

## 2020-08-25 ENCOUNTER — Other Ambulatory Visit: Payer: Self-pay

## 2020-08-25 ENCOUNTER — Encounter: Payer: Self-pay | Admitting: Obstetrics & Gynecology

## 2020-08-25 ENCOUNTER — Ambulatory Visit: Payer: Medicare HMO | Admitting: Obstetrics & Gynecology

## 2020-08-25 VITALS — BP 146/80 | HR 80 | Ht 61.0 in | Wt 164.6 lb

## 2020-08-25 DIAGNOSIS — N816 Rectocele: Secondary | ICD-10-CM

## 2020-08-25 NOTE — Progress Notes (Signed)
   GYN VISIT Patient name: Taylor Singleton MRN [id number]  Date of birth: 1955-10-23 Chief Complaint:   New Patient (Initial Visit) and Gynecologic Exam  History of Present Illness:   Taylor Singleton is a 65 y.o. G2P2002 postmenopausal post-hyst female being seen today for prolapse concerns.     Notes this has been an ongoing issues for over 69yrs that has progressively gotten worse.  Notes that she has to push up in her vagina a few times a day to help with BMs.  Usually has BM once daily.   +Urinary frequency, denies urgency or incontinence.  Denies pelvic or abdominal pain.  Denies abnormal vaginal discharge, itching or irritation. Not sexually active  No LMP recorded. Patient has had a hysterectomy.   Moved down here from [place].  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  Goal was to stop @ 65yo  Depression screen [facility] 2/9 08/25/2020 07/10/2020 09/26/2017 05/26/2017  Decreased Interest 0 0 0 0  Down, Depressed, Hopeless 0 0 0 0  PHQ - 2 Score 0 0 0 0  Altered sleeping 0 - - -  Tired, decreased energy 0 - - -  Change in appetite 0 - - -  Feeling bad or failure about yourself  0 - - -  Trouble concentrating 0 - - -  Moving slowly or fidgety/restless 0 - - -  Suicidal thoughts 0 - - -  PHQ-9 Score 0 - - -     Current Outpatient Medications  Medication Instructions  . atorvastatin (LIPITOR) 20 mg, Oral, Daily  . calcium carbonate (OS-CAL) 600 mg, Oral, 2 times daily with meals  . cholecalciferol (VITAMIN D) 1,000 Units, Oral, Daily  . ELDERBERRY PO Oral  . HORSE CHESTNUT PO Oral  . Lactobacillus-Inulin (PROBIOTIC DIGESTIVE SUPPORT PO) Oral     Review of Systems:   Pertinent items are noted in HPI Denies fever/chills, dizziness, headaches, visual disturbances, fatigue, shortness of breath, chest pain, abdominal pain, vomiting, bowel movements, urination, or intercourse unless otherwise stated above.   Pertinent History Reviewed:  Reviewed past  medical,surgical, social, obstetrical and family history.  Reviewed problem list, medications and allergies. Physical Assessment:   Vitals:   08/25/20 1036  BP: (!) 146/80  Pulse: 80  Weight: 164 lb 9.6 oz (74.7 kg)  Height: 5\' 1"  (1.549 m)  Body mass index is 31.1 kg/m.       Physical Examination:   General appearance: alert, well appearing, and in no distress  Mental status: alert, oriented to person, place, and time  Skin: warm & dry   Cardiovascular: normal heart rate noted  Respiratory: normal respiratory effort, no distress  Abdomen: soft, non-tender, no rebound, no guarding  Pelvic: normal external genitalia, vulva, vagina, cervix, uterus and adnexa, VULVA: normal appearing vulva with no masses, tenderness or lesions, VAGINA: normal appearing vagina with normal color and discharge, no anterior compartment prolapse, apex well supported, Stage 2 rectocele appreciated, uterus and cervix surgically absent  Extremities: no edema   Chaperone: Angel Neas    Assessment & Plan:  1) Stage 2 rectocele Reviewed conservative management including pelvic floor exercises and pessary.  Also discussed surgical intervention- would consider referral to Dr. .  Questions and concerns were addressed.  Plans to do some research on her own and will let Florian Buff know final decision  [place], DO Attending Obstetrician & Gynecologist, [facility]

## 2020-08-25 NOTE — Patient Instructions (Signed)
Rectocele  -Pelvic floor therapy/exercise -Pessary Or consider surgical intervention/2nd opinion- Dr. Lanetta Inch

## 2020-08-26 ENCOUNTER — Other Ambulatory Visit: Payer: Self-pay

## 2020-08-26 DIAGNOSIS — M9901 Segmental and somatic dysfunction of cervical region: Secondary | ICD-10-CM | POA: Diagnosis not present

## 2020-08-26 DIAGNOSIS — M5431 Sciatica, right side: Secondary | ICD-10-CM | POA: Diagnosis not present

## 2020-08-26 DIAGNOSIS — M9903 Segmental and somatic dysfunction of lumbar region: Secondary | ICD-10-CM | POA: Diagnosis not present

## 2020-08-28 DIAGNOSIS — M9901 Segmental and somatic dysfunction of cervical region: Secondary | ICD-10-CM | POA: Diagnosis not present

## 2020-08-28 DIAGNOSIS — M5431 Sciatica, right side: Secondary | ICD-10-CM | POA: Diagnosis not present

## 2020-08-28 DIAGNOSIS — M9903 Segmental and somatic dysfunction of lumbar region: Secondary | ICD-10-CM | POA: Diagnosis not present

## 2020-09-04 DIAGNOSIS — M9903 Segmental and somatic dysfunction of lumbar region: Secondary | ICD-10-CM | POA: Diagnosis not present

## 2020-09-04 DIAGNOSIS — M5431 Sciatica, right side: Secondary | ICD-10-CM | POA: Diagnosis not present

## 2020-09-04 DIAGNOSIS — M9901 Segmental and somatic dysfunction of cervical region: Secondary | ICD-10-CM | POA: Diagnosis not present

## 2020-09-09 DIAGNOSIS — M9903 Segmental and somatic dysfunction of lumbar region: Secondary | ICD-10-CM | POA: Diagnosis not present

## 2020-09-09 DIAGNOSIS — M5431 Sciatica, right side: Secondary | ICD-10-CM | POA: Diagnosis not present

## 2020-09-09 DIAGNOSIS — M9901 Segmental and somatic dysfunction of cervical region: Secondary | ICD-10-CM | POA: Diagnosis not present

## 2020-09-16 DIAGNOSIS — M9901 Segmental and somatic dysfunction of cervical region: Secondary | ICD-10-CM | POA: Diagnosis not present

## 2020-09-16 DIAGNOSIS — M9903 Segmental and somatic dysfunction of lumbar region: Secondary | ICD-10-CM | POA: Diagnosis not present

## 2020-09-16 DIAGNOSIS — M5431 Sciatica, right side: Secondary | ICD-10-CM | POA: Diagnosis not present

## 2020-09-24 DIAGNOSIS — M5431 Sciatica, right side: Secondary | ICD-10-CM | POA: Diagnosis not present

## 2020-09-24 DIAGNOSIS — M9903 Segmental and somatic dysfunction of lumbar region: Secondary | ICD-10-CM | POA: Diagnosis not present

## 2020-09-24 DIAGNOSIS — M9901 Segmental and somatic dysfunction of cervical region: Secondary | ICD-10-CM | POA: Diagnosis not present

## 2020-09-29 DIAGNOSIS — M9903 Segmental and somatic dysfunction of lumbar region: Secondary | ICD-10-CM | POA: Diagnosis not present

## 2020-09-29 DIAGNOSIS — M9901 Segmental and somatic dysfunction of cervical region: Secondary | ICD-10-CM | POA: Diagnosis not present

## 2020-09-29 DIAGNOSIS — M5431 Sciatica, right side: Secondary | ICD-10-CM | POA: Diagnosis not present

## 2020-10-07 DIAGNOSIS — M9901 Segmental and somatic dysfunction of cervical region: Secondary | ICD-10-CM | POA: Diagnosis not present

## 2020-10-07 DIAGNOSIS — M5431 Sciatica, right side: Secondary | ICD-10-CM | POA: Diagnosis not present

## 2020-10-07 DIAGNOSIS — M9903 Segmental and somatic dysfunction of lumbar region: Secondary | ICD-10-CM | POA: Diagnosis not present

## 2020-10-14 DIAGNOSIS — M9903 Segmental and somatic dysfunction of lumbar region: Secondary | ICD-10-CM | POA: Diagnosis not present

## 2020-10-14 DIAGNOSIS — M5431 Sciatica, right side: Secondary | ICD-10-CM | POA: Diagnosis not present

## 2020-10-14 DIAGNOSIS — M9901 Segmental and somatic dysfunction of cervical region: Secondary | ICD-10-CM | POA: Diagnosis not present

## 2020-10-22 DIAGNOSIS — H04123 Dry eye syndrome of bilateral lacrimal glands: Secondary | ICD-10-CM | POA: Diagnosis not present

## 2020-10-28 DIAGNOSIS — M9901 Segmental and somatic dysfunction of cervical region: Secondary | ICD-10-CM | POA: Diagnosis not present

## 2020-10-28 DIAGNOSIS — M5431 Sciatica, right side: Secondary | ICD-10-CM | POA: Diagnosis not present

## 2020-10-28 DIAGNOSIS — M9903 Segmental and somatic dysfunction of lumbar region: Secondary | ICD-10-CM | POA: Diagnosis not present

## 2020-11-04 DIAGNOSIS — M9901 Segmental and somatic dysfunction of cervical region: Secondary | ICD-10-CM | POA: Diagnosis not present

## 2020-11-04 DIAGNOSIS — M9903 Segmental and somatic dysfunction of lumbar region: Secondary | ICD-10-CM | POA: Diagnosis not present

## 2020-11-04 DIAGNOSIS — M5431 Sciatica, right side: Secondary | ICD-10-CM | POA: Diagnosis not present

## 2020-11-14 ENCOUNTER — Ambulatory Visit: Payer: Medicare HMO

## 2020-11-17 DIAGNOSIS — M5431 Sciatica, right side: Secondary | ICD-10-CM | POA: Diagnosis not present

## 2020-11-17 DIAGNOSIS — M9901 Segmental and somatic dysfunction of cervical region: Secondary | ICD-10-CM | POA: Diagnosis not present

## 2020-11-17 DIAGNOSIS — M9903 Segmental and somatic dysfunction of lumbar region: Secondary | ICD-10-CM | POA: Diagnosis not present

## 2020-11-27 NOTE — Progress Notes (Signed)
Subjective:   Taylor Singleton is a 65 y.o. female who presents for an Initial Medicare Annual Wellness Visit.  I connected with Cheyenne Adas today by telephone and verified that I am speaking with the correct person using two identifiers. Location patient: home Location provider: work Persons participating in the virtual visit: patient, provider.   I discussed the limitations, risks, security and privacy concerns of performing an evaluation and management service by telephone and the availability of in person appointments. I also discussed with the patient that there may be a patient responsible charge related to this service. The patient expressed understanding and verbally consented to this telephonic visit.    Interactive audio and video telecommunications were attempted between this provider and patient, however failed, due to patient having technical difficulties OR patient did not have access to video capability.  We continued and completed visit with audio only.     Review of Systems    NA  Cardiac Risk Factors include: advanced age (>52men, >25 women);dyslipidemia     Objective:    Today's Vitals   There is no height or weight on file to calculate BMI.  Advanced Directives 11/28/2020 06/05/2016  Does Patient Have a Medical Advance Directive? Yes No  Type of Advance Directive Healthcare Power of Attorney -  Does patient want to make changes to medical advance directive? No - Patient declined -  Copy of Healthcare Power of Attorney in Chart? No - copy requested -  Would patient like information on creating a medical advance directive? - No - Patient declined    Current Medications (verified) Outpatient Encounter Medications as of 11/28/2020  Medication Sig   atorvastatin (LIPITOR) 20 MG tablet Take 1 tablet (20 mg total) by mouth daily.   calcium carbonate (OS-CAL) 600 MG TABS tablet Take 600 mg by mouth 2 (two) times daily with a meal.   cholecalciferol (VITAMIN D) 1000  units tablet Take 1,000 Units by mouth daily.   ELDERBERRY PO Take by mouth.   HORSE CHESTNUT PO Take by mouth.   [DISCONTINUED] Lactobacillus-Inulin (PROBIOTIC DIGESTIVE SUPPORT PO) Take by mouth. (Patient not taking: Reported on 11/28/2020)   No facility-administered encounter medications on file as of 11/28/2020.    Allergies (verified) Patient has no known allergies.   History: Past Medical History:  Diagnosis Date   Arthritis    HLD (hyperlipidemia)    Past Surgical History:  Procedure Laterality Date   ABDOMINAL HYSTERECTOMY     due to bleeding TAH/BSO   BREAST CYST EXCISION Left    Family History  Problem Relation Age of Onset   Diabetes Mother    Heart attack Brother    Social History   Socioeconomic History   Marital status: Married    Spouse name: Not on file   Number of children: 2   Years of education: Not on file   Highest education level: Not on file  Occupational History   Not on file  Tobacco Use   Smoking status: Never   Smokeless tobacco: Never  Vaping Use   Vaping Use: Never used  Substance and Sexual Activity   Alcohol use: No   Drug use: No   Sexual activity: Not Currently    Birth control/protection: Surgical  Other Topics Concern   Not on file  Social History Narrative   Not on file   Social Determinants of Health   Financial Resource Strain: Low Risk    Difficulty of Paying Living Expenses: Not hard at all  Food  Insecurity: No Food Insecurity   Worried About Programme researcher, broadcasting/film/videounning Out of Food in the Last Year: Never true   Ran Out of Food in the Last Year: Never true  Transportation Needs: No Transportation Needs   Lack of Transportation (Medical): No   Lack of Transportation (Non-Medical): No  Physical Activity: Sufficiently Active   Days of Exercise per Week: 7 days   Minutes of Exercise per Session: 30 min  Stress: No Stress Concern Present   Feeling of Stress : Not at all  Social Connections: Moderately Isolated   Frequency of  Communication with Friends and Family: More than three times a week   Frequency of Social Gatherings with Friends and Family: More than three times a week   Attends Religious Services: Never   Database administratorActive Member of Clubs or Organizations: No   Attends Engineer, structuralClub or Organization Meetings: Never   Marital Status: Married    Tobacco Counseling Counseling given: Not Answered   Clinical Intake:  Pre-visit preparation completed: Yes  Pain : No/denies pain     Nutritional Risks: None Diabetes: No  How often do you need to have someone help you when you read instructions, pamphlets, or other written materials from your doctor or pharmacy?: 1 - Never  Diabetic?No   Interpreter Needed?: No  Information entered by :: SCrews,LPN   Activities of Daily Living In your present state of health, do you have any difficulty performing the following activities: 11/28/2020 07/10/2020  Hearing? N N  Vision? N N  Difficulty concentrating or making decisions? N N  Walking or climbing stairs? N N  Dressing or bathing? N N  Doing errands, shopping? N N  Preparing Food and eating ? N -  Using the Toilet? N -  In the past six months, have you accidently leaked urine? N -  Do you have problems with loss of bowel control? N -  Managing your Medications? N -  Managing your Finances? N -  Housekeeping or managing your Housekeeping? N -  Some recent data might be hidden    Patient Care Team: Donita BrooksPickard, Warren T, MD as PCP - General (Family Medicine)  Indicate any recent Medical Services you may have received from other than [facility] providers in the past year (date may be approximate).     Assessment:   This is a routine wellness examination for Taylor Singleton.  Hearing/Vision screen Vision Screening - Comments:: Patient states gets eyes examined every other year. Currently wears glasses   Dietary issues and exercise activities discussed: Current Exercise Habits: Home exercise routine, Type of exercise:  walking;treadmill, Time (Minutes): 30, Frequency (Times/Week): 7, Weekly Exercise (Minutes/Week): 210, Intensity: Moderate, Exercise limited by: None identified   Goals Addressed             This Visit's Progress    Patient Stated       I would like to continue to walk daily        Depression Screen PHQ 2/9 Scores 11/28/2020 08/25/2020 07/10/2020 09/26/2017 05/26/2017  PHQ - 2 Score 0 0 0 0 0  PHQ- 9 Score - 0 - - -    Fall Risk Fall Risk  11/28/2020 08/25/2020 07/10/2020  Falls in the past year? 0 0 0  Number falls in past yr: 0 0 0  Injury with Fall? 0 0 0  Risk for fall due to : No Fall Risks No Fall Risks -  Follow up Falls evaluation completed;Falls prevention discussed - Falls evaluation completed    FALL RISK PREVENTION  PERTAINING TO THE HOME:  Any stairs in or around the home? Yes  If so, are there any without handrails? No  Home free of loose throw rugs in walkways, pet beds, electrical cords, etc? Yes  Adequate lighting in your home to reduce risk of falls? Yes   ASSISTIVE DEVICES UTILIZED TO PREVENT FALLS:  Life alert? No  Use of a cane, walker or w/c? No  Grab bars in the bathroom? No  Shower chair or bench in shower? No  Elevated toilet seat or a handicapped toilet? No    Cognitive Function:    Normal cognitive status assessed by direct observation by this Nurse Health Advisor. No abnormalities found.      Immunizations Immunization History  Administered Date(s) Administered   PFIZER(Purple Top)SARS-COV-2 Vaccination 08/07/2019, 08/28/2019    TDAP status: Due, Education has been provided regarding the importance of this vaccine. Advised may receive this vaccine at local pharmacy or Health Dept. Aware to provide a copy of the vaccination record if obtained from local pharmacy or Health Dept. Verbalized acceptance and understanding.  Flu Vaccine status: Due, Education has been provided regarding the importance of this vaccine. Advised may receive this  vaccine at local pharmacy or Health Dept. Aware to provide a copy of the vaccination record if obtained from local pharmacy or Health Dept. Verbalized acceptance and understanding.  Pneumococcal vaccine status: Due, Education has been provided regarding the importance of this vaccine. Advised may receive this vaccine at local pharmacy or Health Dept. Aware to provide a copy of the vaccination record if obtained from local pharmacy or Health Dept. Verbalized acceptance and understanding.  Covid-19 vaccine status: Completed vaccines  Qualifies for Shingles Vaccine? Yes   Zostavax completed No   Shingrix Completed?: No.    Education has been provided regarding the importance of this vaccine. Patient has been advised to call insurance company to determine out of pocket expense if they have not yet received this vaccine. Advised may also receive vaccine at local pharmacy or Health Dept. Verbalized acceptance and understanding.  Screening Tests Health Maintenance  Topic Date Due   TETANUS/TDAP  Never done   Zoster Vaccines- Shingrix (1 of 2) Never done   DEXA SCAN  Never done   PNA vac Low Risk Adult (1 of 2 - PCV13) Never done   COVID-19 Vaccine (3 - Pfizer risk series) 09/25/2019   MAMMOGRAM  11/12/2020   INFLUENZA VACCINE  01/12/2021   COLONOSCOPY (Pts 45-72yrs Insurance coverage will need to be confirmed)  06/14/2021   Hepatitis C Screening  Completed   HPV VACCINES  Aged Out    Health Maintenance  Health Maintenance Due  Topic Date Due   TETANUS/TDAP  Never done   Zoster Vaccines- Shingrix (1 of 2) Never done   DEXA SCAN  Never done   PNA vac Low Risk Adult (1 of 2 - PCV13) Never done   COVID-19 Vaccine (3 - Pfizer risk series) 09/25/2019   MAMMOGRAM  11/12/2020    Colorectal cancer screening: Type of screening: Colonoscopy. Completed 06/15/2011. Repeat every 10 years  Mammogram status: Ordered 11/28/2020. Pt provided with contact info and advised to call to schedule appt.    Bone Density status: Ordered 11/28/2020. Pt provided with contact info and advised to call to schedule appt.  Lung Cancer Screening: (Low Dose CT Chest recommended if Age 53-80 years, 30 pack-year currently smoking OR have quit w/in 15years.) does not qualify.   Lung Cancer Screening Referral: N/A  Additional Screening:  Hepatitis C Screening: does qualify; Completed 06/02/2017  Vision Screening: Recommended annual ophthalmology exams for early detection of glaucoma and other disorders of the eye. Is the patient up to date with their annual eye exam?  No  Who is the provider or what is the name of the office in which the patient attends annual eye exams? [facility] [place] If pt is not established with a provider, would they like to be referred to a provider to establish care? No .   Dental Screening: Recommended annual dental exams for proper oral hygiene  Community Resource Referral / Chronic Care Management: CRR required this visit?  No   CCM required this visit?  No      Plan:     I have personally reviewed and noted the following in the patient's chart:   Medical and social history Use of alcohol, tobacco or illicit drugs  Current medications and supplements including opioid prescriptions. Patient is currently taking opioid prescriptions. Information provided to patient regarding non-opioid alternatives. Patient advised to discuss non-opioid treatment plan with their provider. Functional ability and status Nutritional status Physical activity Advanced directives List of other physicians Hospitalizations, surgeries, and ER visits in previous 12 months Vitals Screenings to include cognitive, depression, and falls Referrals and appointments  In addition, I have reviewed and discussed with patient certain preventive protocols, quality metrics, and best practice recommendations. A written personalized care plan for preventive services as well as general preventive  health recommendations were provided to patient.     Theodora Blow, LPN   02/05/2352   Nurse Notes: None

## 2020-11-28 ENCOUNTER — Ambulatory Visit (INDEPENDENT_AMBULATORY_CARE_PROVIDER_SITE_OTHER): Payer: Medicare HMO

## 2020-11-28 ENCOUNTER — Other Ambulatory Visit: Payer: Self-pay

## 2020-11-28 DIAGNOSIS — Z1231 Encounter for screening mammogram for malignant neoplasm of breast: Secondary | ICD-10-CM | POA: Diagnosis not present

## 2020-11-28 DIAGNOSIS — Z780 Asymptomatic menopausal state: Secondary | ICD-10-CM

## 2020-11-28 DIAGNOSIS — Z0000 Encounter for general adult medical examination without abnormal findings: Secondary | ICD-10-CM | POA: Diagnosis not present

## 2020-11-28 NOTE — Patient Instructions (Signed)
Taylor Singleton , Thank you for taking time to come for your Medicare Wellness Visit. I appreciate your ongoing commitment to your health goals. Please review the following plan we discussed and let me know if I can assist you in the future.   Screening recommendations/referrals: Colonoscopy: Up to date, next due 06/14/2021 Mammogram: Currently due, orders placed this visit  Bone Density: Currently due, orders placed this visit  Recommended yearly ophthalmology/optometry visit for glaucoma screening and checkup Recommended yearly dental visit for hygiene and checkup  Vaccinations: Influenza vaccine: Patient declined  Pneumococcal vaccine: Patient declined  Tdap vaccine: Currently due, you may await and injury to recieve Shingles vaccine: Currently due, If you would like to receive we recommend that you do so at your local pharmacy    Advanced directives: Please bring in copies of your advanced medical directives so that we may scan them into your chart.  Conditions/risks identified: None   Next appointment: None    Preventive Care 65 Years and Older, Female Preventive care refers to lifestyle choices and visits with your health care provider that can promote health and wellness. What does preventive care include? A yearly physical exam. This is also called an annual well check. Dental exams once or twice a year. Routine eye exams. Ask your health care provider how often you should have your eyes checked. Personal lifestyle choices, including: Daily care of your teeth and gums. Regular physical activity. Eating a healthy diet. Avoiding tobacco and drug use. Limiting alcohol use. Practicing safe sex. Taking low-dose aspirin every day. Taking vitamin and mineral supplements as recommended by your health care provider. What happens during an annual well check? The services and screenings done by your health care provider during your annual well check will depend on your age, overall  health, lifestyle risk factors, and family history of disease. Counseling  Your health care provider may ask you questions about your: Alcohol use. Tobacco use. Drug use. Emotional well-being. Home and relationship well-being. Sexual activity. Eating habits. History of falls. Memory and ability to understand (cognition). Work and work Astronomer. Reproductive health. Screening  You may have the following tests or measurements: Height, weight, and BMI. Blood pressure. Lipid and cholesterol levels. These may be checked every 5 years, or more frequently if you are over 40 years old. Skin check. Lung cancer screening. You may have this screening every year starting at age 48 if you have a 30-pack-year history of smoking and currently smoke or have quit within the past 15 years. Fecal occult blood test (FOBT) of the stool. You may have this test every year starting at age 55. Flexible sigmoidoscopy or colonoscopy. You may have a sigmoidoscopy every 5 years or a colonoscopy every 10 years starting at age 25. Hepatitis C blood test. Hepatitis B blood test. Sexually transmitted disease (STD) testing. Diabetes screening. This is done by checking your blood sugar (glucose) after you have not eaten for a while (fasting). You may have this done every 1-3 years. Bone density scan. This is done to screen for osteoporosis. You may have this done starting at age 31. Mammogram. This may be done every 1-2 years. Talk to your health care provider about how often you should have regular mammograms. Talk with your health care provider about your test results, treatment options, and if necessary, the need for more tests. Vaccines  Your health care provider may recommend certain vaccines, such as: Influenza vaccine. This is recommended every year. Tetanus, diphtheria, and acellular pertussis (Tdap, Td)  vaccine. You may need a Td booster every 10 years. Zoster vaccine. You may need this after age  50. Pneumococcal 13-valent conjugate (PCV13) vaccine. One dose is recommended after age 73. Pneumococcal polysaccharide (PPSV23) vaccine. One dose is recommended after age 58. Talk to your health care provider about which screenings and vaccines you need and how often you need them. This information is not intended to replace advice given to you by your health care provider. Make sure you discuss any questions you have with your health care provider. Document Released: 06/27/2015 Document Revised: 02/18/2016 Document Reviewed: 04/01/2015 Elsevier Interactive Patient Education  2017 [place] Prevention in the Home Falls can cause injuries. They can happen to people of all ages. There are many things you can do to make your home safe and to help prevent falls. What can I do on the outside of my home? Regularly fix the edges of walkways and driveways and fix any cracks. Remove anything that might make you trip as you walk through a door, such as a raised step or threshold. Trim any bushes or trees on the path to your home. Use bright outdoor lighting. Clear any walking paths of anything that might make someone trip, such as rocks or tools. Regularly check to see if handrails are loose or broken. Make sure that both sides of any steps have handrails. Any raised decks and porches should have guardrails on the edges. Have any leaves, snow, or ice cleared regularly. Use sand or salt on walking paths during winter. Clean up any spills in your garage right away. This includes oil or grease spills. What can I do in the bathroom? Use night lights. Install grab bars by the toilet and in the tub and shower. Do not use towel bars as grab bars. Use non-skid mats or decals in the tub or shower. If you need to sit down in the shower, use a plastic, non-slip stool. Keep the floor dry. Clean up any water that spills on the floor as soon as it happens. Remove soap buildup in the tub or shower  regularly. Attach bath mats securely with double-sided non-slip rug tape. Do not have throw rugs and other things on the floor that can make you trip. What can I do in the bedroom? Use night lights. Make sure that you have a light by your bed that is easy to reach. Do not use any sheets or blankets that are too big for your bed. They should not hang down onto the floor. Have a firm chair that has side arms. You can use this for support while you get dressed. Do not have throw rugs and other things on the floor that can make you trip. What can I do in the kitchen? Clean up any spills right away. Avoid walking on wet floors. Keep items that you use a lot in easy-to-reach places. If you need to reach something above you, use a strong step stool that has a grab bar. Keep electrical cords out of the way. Do not use floor polish or wax that makes floors slippery. If you must use wax, use non-skid floor wax. Do not have throw rugs and other things on the floor that can make you trip. What can I do with my stairs? Do not leave any items on the stairs. Make sure that there are handrails on both sides of the stairs and use them. Fix handrails that are broken or loose. Make sure that handrails are as long  as the stairways. Check any carpeting to make sure that it is firmly attached to the stairs. Fix any carpet that is loose or worn. Avoid having throw rugs at the top or bottom of the stairs. If you do have throw rugs, attach them to the floor with carpet tape. Make sure that you have a light switch at the top of the stairs and the bottom of the stairs. If you do not have them, ask someone to add them for you. What else can I do to help prevent falls? Wear shoes that: Do not have high heels. Have rubber bottoms. Are comfortable and fit you well. Are closed at the toe. Do not wear sandals. If you use a stepladder: Make sure that it is fully opened. Do not climb a closed stepladder. Make sure that  both sides of the stepladder are locked into place. Ask someone to hold it for you, if possible. Clearly mark and make sure that you can see: Any grab bars or handrails. First and last steps. Where the edge of each step is. Use tools that help you move around (mobility aids) if they are needed. These include: Canes. Walkers. Scooters. Crutches. Turn on the lights when you go into a dark area. Replace any light bulbs as soon as they burn out. Set up your furniture so you have a clear path. Avoid moving your furniture around. If any of your floors are uneven, fix them. If there are any pets around you, be aware of where they are. Review your medicines with your doctor. Some medicines can make you feel dizzy. This can increase your chance of falling. Ask your doctor what other things that you can do to help prevent falls. This information is not intended to replace advice given to you by your health care provider. Make sure you discuss any questions you have with your health care provider. Document Released: 03/27/2009 Document Revised: 11/06/2015 Document Reviewed: 07/05/2014 Elsevier Interactive Patient Education  2017 [employer].

## 2020-12-02 DIAGNOSIS — M5431 Sciatica, right side: Secondary | ICD-10-CM | POA: Diagnosis not present

## 2020-12-02 DIAGNOSIS — M9903 Segmental and somatic dysfunction of lumbar region: Secondary | ICD-10-CM | POA: Diagnosis not present

## 2020-12-02 DIAGNOSIS — M9901 Segmental and somatic dysfunction of cervical region: Secondary | ICD-10-CM | POA: Diagnosis not present

## 2020-12-16 DIAGNOSIS — M5431 Sciatica, right side: Secondary | ICD-10-CM | POA: Diagnosis not present

## 2020-12-16 DIAGNOSIS — M9901 Segmental and somatic dysfunction of cervical region: Secondary | ICD-10-CM | POA: Diagnosis not present

## 2020-12-16 DIAGNOSIS — M9903 Segmental and somatic dysfunction of lumbar region: Secondary | ICD-10-CM | POA: Diagnosis not present

## 2020-12-21 ENCOUNTER — Other Ambulatory Visit: Payer: Self-pay

## 2020-12-21 ENCOUNTER — Encounter (HOSPITAL_COMMUNITY): Payer: Self-pay

## 2020-12-21 ENCOUNTER — Emergency Department (HOSPITAL_COMMUNITY)
Admission: EM | Admit: 2020-12-21 | Discharge: 2020-12-22 | Disposition: A | Discharge status: Home / Self Care | Payer: Medicare HMO | Attending: Emergency Medicine | Admitting: Emergency Medicine

## 2020-12-21 DIAGNOSIS — R112 Nausea with vomiting, unspecified: Secondary | ICD-10-CM | POA: Diagnosis not present

## 2020-12-21 DIAGNOSIS — R42 Dizziness and giddiness: Secondary | ICD-10-CM | POA: Insufficient documentation

## 2020-12-21 DIAGNOSIS — I10 Essential (primary) hypertension: Secondary | ICD-10-CM | POA: Diagnosis not present

## 2020-12-21 DIAGNOSIS — Z743 Need for continuous supervision: Secondary | ICD-10-CM | POA: Diagnosis not present

## 2020-12-21 LAB — CBC
HCT: 41.8 % (ref 36.0–46.0)
Hemoglobin: 14.1 g/dL (ref 12.0–15.0)
MCH: 29.4 pg (ref 26.0–34.0)
MCHC: 33.7 g/dL (ref 30.0–36.0)
MCV: 87.1 fL (ref 80.0–100.0)
Platelets: 314 10*3/uL (ref 150–400)
RBC: 4.8 MIL/uL (ref 3.87–5.11)
RDW: 12.5 % (ref 11.5–15.5)
WBC: 8.9 10*3/uL (ref 4.0–10.5)
nRBC: 0 % (ref 0.0–0.2)

## 2020-12-21 LAB — COMPREHENSIVE METABOLIC PANEL
ALT: 27 U/L (ref 0–44)
AST: 21 U/L (ref 15–41)
Albumin: 4.3 g/dL (ref 3.5–5.0)
Alkaline Phosphatase: 63 U/L (ref 38–126)
Anion gap: 8 (ref 5–15)
BUN: 25 mg/dL — ABNORMAL HIGH (ref 8–23)
CO2: 24 mmol/L (ref 22–32)
Calcium: 9.3 mg/dL (ref 8.9–10.3)
Chloride: 104 mmol/L (ref 98–111)
Creatinine, Ser: 0.5 mg/dL (ref 0.44–1.00)
GFR, Estimated: >60 mL/min (ref >60)
Glucose, Bld: 125 mg/dL — ABNORMAL HIGH (ref 70–99)
Potassium: 3.5 mmol/L (ref 3.5–5.1)
Sodium: 136 mmol/L (ref 135–145)
Total Bilirubin: 1.2 mg/dL (ref 0.3–1.2)
Total Protein: 7.6 g/dL (ref 6.5–8.1)

## 2020-12-21 LAB — LIPASE, BLOOD: Lipase: 26 U/L (ref 11–51)

## 2020-12-21 MED ORDER — PROCHLORPERAZINE EDISYLATE 10 MG/2ML IJ SOLN
5.0000 mg | Freq: Once | INTRAMUSCULAR | Status: AC
  Administered 2020-12-21: 5 mg via INTRAVENOUS
  Filled 2020-12-21: qty 2

## 2020-12-21 MED ORDER — SODIUM CHLORIDE 0.9 % IV BOLUS
1000.0000 mL | Freq: Once | INTRAVENOUS | Status: AC
  Administered 2020-12-21: 1000 mL via INTRAVENOUS

## 2020-12-21 MED ORDER — DIPHENHYDRAMINE HCL 50 MG/ML IJ SOLN
25.0000 mg | Freq: Once | INTRAMUSCULAR | Status: AC
  Administered 2020-12-21: 25 mg via INTRAVENOUS
  Filled 2020-12-21: qty 1

## 2020-12-21 MED ORDER — MECLIZINE HCL 25 MG PO TABS
50.0000 mg | ORAL_TABLET | Freq: Once | ORAL | Status: AC
  Administered 2020-12-21: 50 mg via ORAL
  Filled 2020-12-21: qty 2

## 2020-12-21 MED ORDER — ONDANSETRON HCL 4 MG/2ML IJ SOLN
4.0000 mg | Freq: Once | INTRAMUSCULAR | Status: AC | PRN
  Administered 2020-12-21: 4 mg via INTRAVENOUS
  Filled 2020-12-21: qty 2

## 2020-12-21 NOTE — ED Triage Notes (Signed)
Pt arrived via ems for c/o n/v/d and dizziness x2 days.  Pt has hx of vertigo but states this feels different than vertigo.  EMS gave pt 4mg  IV zofran and 500cc of fluids, pt reports her nausea has improved.  Most recent vitals reported by EMS: 158/88, CBG 125, HR 92, 97% on ra.  Pt a&o.

## 2020-12-22 LAB — URINALYSIS, ROUTINE W REFLEX MICROSCOPIC
Bacteria, UA: NONE SEEN
Bilirubin Urine: NEGATIVE
Glucose, UA: NEGATIVE mg/dL
Hgb urine dipstick: NEGATIVE
Ketones, ur: 20 mg/dL — AB
Nitrite: NEGATIVE
Protein, ur: NEGATIVE mg/dL
Specific Gravity, Urine: 1.019 (ref 1.005–1.030)
WBC, UA: >50 WBC/hpf — ABNORMAL HIGH (ref 0–5)
pH: 6 (ref 5.0–8.0)

## 2020-12-22 MED ORDER — MECLIZINE HCL 25 MG PO TABS: 25.0000 mg | ORAL_TABLET | Freq: Three times a day (TID) | ORAL | 0 refills | Status: DC | PRN

## 2020-12-22 NOTE — ED Notes (Signed)
Pt's spouse notified that pt is being discharged.

## 2020-12-22 NOTE — ED Provider Notes (Signed)
[facility] Emergency Department Provider Note MRN:  [id number]  Arrival date & time: 12/22/20     Chief Complaint   Dizziness (N/v/d)   History of Present Illness   Taylor Singleton is a 65 y.o. year-old female with a history of hyperlipidemia presenting to the ED with chief complaint of dizziness.  Sudden onset dizziness described as a room spinning.  Severe, started last night right after patient rolled over in bed.  Fairly constant since that time, made much worse with certain movements of the head.  Has had vertigo in the past and this seems similar but more severe than prior episodes.  She denies any numbness or weakness to the arms or legs, no vision change, no trouble swallowing, no pain.  Very uncomfortable and nauseating, vomiting multiple times, unable to eat or drink.  Review of Systems  A complete 10 system review of systems was obtained and all systems are negative except as noted in the HPI and PMH.   Patient's Health History    Past Medical History:  Diagnosis Date   Arthritis    HLD (hyperlipidemia)     Past Surgical History:  Procedure Laterality Date   ABDOMINAL HYSTERECTOMY     due to bleeding TAH/BSO   BREAST CYST EXCISION Left     Family History  Problem Relation Age of Onset   Diabetes Mother    Heart attack Brother     Social History   Socioeconomic History   Marital status: Married    Spouse name: Not on file   Number of children: 2   Years of education: Not on file   Highest education level: Not on file  Occupational History   Not on file  Tobacco Use   Smoking status: Never   Smokeless tobacco: Never  Vaping Use   Vaping Use: Never used  Substance and Sexual Activity   Alcohol use: No   Drug use: No   Sexual activity: Not Currently    Birth control/protection: Surgical  Other Topics Concern   Not on file  Social History Narrative   Not on file   Social Determinants of Health   Financial Resource Strain: Low  Risk    Difficulty of Paying Living Expenses: Not hard at all  Food Insecurity: No Food Insecurity   Worried About Programme researcher, broadcasting/film/video in the Last Year: Never true   Ran Out of Food in the Last Year: Never true  Transportation Needs: No Transportation Needs   Lack of Transportation (Medical): No   Lack of Transportation (Non-Medical): No  Physical Activity: Sufficiently Active   Days of Exercise per Week: 7 days   Minutes of Exercise per Session: 30 min  Stress: No Stress Concern Present   Feeling of Stress : Not at all  Social Connections: Moderately Isolated   Frequency of Communication with Friends and Family: More than three times a week   Frequency of Social Gatherings with Friends and Family: More than three times a week   Attends Religious Services: Never   Database administrator or Organizations: No   Attends Banker Meetings: Never   Marital Status: Married  Catering manager Violence: Not At Risk   Fear of Current or Ex-Partner: No   Emotionally Abused: No   Physically Abused: No   Sexually Abused: No     Physical Exam   Vitals:   12/22/20 0000 12/22/20 0100  BP: (!) 157/82 (!) 141/71  Pulse: 94 88  Resp:  16 14  Temp:    SpO2: 96% 95%    CONSTITUTIONAL: Well-appearing, NAD NEURO:  Alert and oriented x 3, normal and symmetric strength and sensation, normal coordination, normal speech, no nystagmus EYES:  eyes equal and reactive ENT/NECK:  no LAD, no JVD CARDIO: Regular rate, well-perfused, normal S1 and S2 PULM:  CTAB no wheezing or rhonchi GI/GU:  normal bowel sounds, non-distended, non-tender MSK/SPINE:  No gross deformities, no edema SKIN:  no rash, atraumatic PSYCH:  Appropriate speech and behavior  *Additional and/or pertinent findings included in MDM below  Diagnostic and Interventional Summary    EKG Interpretation  Date/Time:  Sunday December 21 2020 21:32:59 EDT Ventricular Rate:  96 PR Interval:  193 QRS Duration: 96 QT  Interval:  366 QTC Calculation: 463 R Axis:   16 Text Interpretation: Sinus rhythm Low voltage, precordial leads Borderline T abnormalities, diffuse leads No old tracing to compare Confirmed by Wentz, Elliott ([id number]) on 12/21/2020 10:20:50 PM        Labs Reviewed  COMPREHENSIVE METABOLIC PANEL - Abnormal; Notable for the following components:      Result Value   Glucose, Bld 125 (*)    BUN 25 (*)    All other components within normal limits  URINALYSIS, ROUTINE W REFLEX MICROSCOPIC - Abnormal; Notable for the following components:   APPearance CLOUDY (*)    Ketones, ur 20 (*)    Leukocytes,Ua LARGE (*)    WBC, UA >50 (*)    Non Squamous Epithelial 0-5 (*)    All other components within normal limits  LIPASE, BLOOD  CBC    No orders to display    Medications  ondansetron (ZOFRAN) injection 4 mg (4 mg Intravenous Given 12/21/20 2203)  meclizine (ANTIVERT) tablet 50 mg (50 mg Oral Given 12/21/20 2320)  sodium chloride 0.9 % bolus 1,000 mL (0 mLs Intravenous Stopped 12/22/20 0101)  diphenhydrAMINE (BENADRYL) injection 25 mg (25 mg Intravenous Given 12/21/20 2324)  prochlorperazine (COMPAZINE) injection 5 mg (5 mg Intravenous Given 12/21/20 2321)     Procedures  /  Critical Care Procedures  ED Course and Medical Decision Making  I have reviewed the triage vital signs, the nursing notes, and pertinent available records from the EMR.  Listed above are laboratory and imaging tests that I personally ordered, reviewed, and interpreted and then considered in my medical decision making (see below for details).  History seems most consistent with a peripheral vertigo, patient has little to no risk factors for stroke, she is well-appearing with a normal neurological exam.  Symptoms began about 24 hours ago.  Will attempt symptomatic management and reassess.     Patient feeling much better on reassessment, able to ambulate to the restroom without issue, continues to have a normal  neurological exam.  Appropriate for discharge with further symptomatic management, strict return precautions.  Kharter Sestak M. Azya Barbero, MD [employer] [email or web address]  Final Clinical Impressions(s) / ED Diagnoses     ICD-10-CM   1. Vertigo  R42       ED Discharge Orders          Ordered    meclizine (ANTIVERT) 25 MG tablet  3 times daily PRN        07 /11/22 0133             Discharge Instructions Discussed with and Provided to Patient:     Discharge Instructions      You were evaluated in the Emergency Department and after  careful evaluation, we did not find any emergent condition requiring admission or further testing in the hospital.  Your exam/testing today was overall reassuring.  Symptoms seem to be due to vertigo related to the inner ear.  Recommend continuing the meclizine as directed and trying the Epley maneuver at home.  Please return to the Emergency Department if you experience any worsening of your condition.  Thank you for allowing [place] to be a part of your care.         Sabas Sous, MD 12/22/20 [phone]

## 2020-12-22 NOTE — Discharge Instructions (Addendum)
You were evaluated in the Emergency Department and after careful evaluation, we did not find any emergent condition requiring admission or further testing in the hospital.  Your exam/testing today was overall reassuring.  Symptoms seem to be due to vertigo related to the inner ear.  Recommend continuing the meclizine as directed and trying the Epley maneuver at home.  Please return to the Emergency Department if you experience any worsening of your condition.  Thank you for allowing [place] to be a part of your care.

## 2020-12-22 NOTE — ED Notes (Signed)
Pt ambulated with assistance to the restroom. Pt states she still feels dizzy

## 2020-12-26 ENCOUNTER — Other Ambulatory Visit: Payer: Self-pay

## 2020-12-26 ENCOUNTER — Ambulatory Visit (INDEPENDENT_AMBULATORY_CARE_PROVIDER_SITE_OTHER): Payer: Medicare HMO | Admitting: Family Medicine

## 2020-12-26 ENCOUNTER — Encounter: Payer: Self-pay | Admitting: Family Medicine

## 2020-12-26 VITALS — BP 130/78 | HR 92 | Temp 98.8°F | Resp 14 | Ht 61.0 in | Wt 159.0 lb

## 2020-12-26 DIAGNOSIS — R739 Hyperglycemia, unspecified: Secondary | ICD-10-CM | POA: Diagnosis not present

## 2020-12-26 DIAGNOSIS — R42 Dizziness and giddiness: Secondary | ICD-10-CM

## 2020-12-26 MED ORDER — SCOPOLAMINE 1 MG/3DAYS TD PT72: 1.0000 | MEDICATED_PATCH | TRANSDERMAL | 12 refills | Status: DC

## 2020-12-26 MED ORDER — ONDANSETRON HCL 4 MG PO TABS: 4.0000 mg | ORAL_TABLET | Freq: Three times a day (TID) | ORAL | 0 refills | Status: DC | PRN

## 2020-12-26 NOTE — Progress Notes (Signed)
Subjective:    Patient ID: Taylor Singleton, female    DOB: 1956/03/17, 65 y.o.   MRN: [id number]  Over the weekend, the patient developed severe vertigo.  She states that if she rolled over to the left side in bed, the room would start spinning.  She was having such severe vertigo that she could not keep anything down.  She was vomiting.  At 1 point she was sitting on the toilet holding a bucket.  She got extremely dehydrated and had to go to the hospital.  They gave her meclizine and IV fluids at the hospital.  She has been performing Epley maneuvers at home and the symptoms are gradually improving.  She states that she is approximately 90% better today.  She is concerned because her blood sugar at the hospital was elevated at 125.  She has a family history of diabetes and she states that she had not eaten anything for quite some time prior to having that blood drawn although she may have been on IV fluid Past Medical History:  Diagnosis Date   Arthritis    HLD (hyperlipidemia)    Past Surgical History:  Procedure Laterality Date   ABDOMINAL HYSTERECTOMY     due to bleeding TAH/BSO   BREAST CYST EXCISION Left    Current Outpatient Medications on File Prior to Visit  Medication Sig Dispense Refill   atorvastatin (LIPITOR) 20 MG tablet Take 1 tablet (20 mg total) by mouth daily. 90 tablet 3   ELDERBERRY PO Take by mouth.     HORSE CHESTNUT PO Take by mouth.     meclizine (ANTIVERT) 25 MG tablet Take 1 tablet (25 mg total) by mouth 3 (three) times daily as needed for dizziness. 30 tablet 0   No current facility-administered medications on file prior to visit.   No Known Allergies Social History   Socioeconomic History   Marital status: Married    Spouse name: Not on file   Number of children: 2   Years of education: Not on file   Highest education level: Not on file  Occupational History   Not on file  Tobacco Use   Smoking status: Never   Smokeless tobacco: Never  Vaping Use    Vaping Use: Never used  Substance and Sexual Activity   Alcohol use: No   Drug use: No   Sexual activity: Not Currently    Birth control/protection: Surgical  Other Topics Concern   Not on file  Social History Narrative   Not on file   Social Determinants of Health   Financial Resource Strain: Low Risk    Difficulty of Paying Living Expenses: Not hard at all  Food Insecurity: No Food Insecurity   Worried About Programme researcher, broadcasting/film/video in the Last Year: Never true   Ran Out of Food in the Last Year: Never true  Transportation Needs: No Transportation Needs   Lack of Transportation (Medical): No   Lack of Transportation (Non-Medical): No  Physical Activity: Sufficiently Active   Days of Exercise per Week: 7 days   Minutes of Exercise per Session: 30 min  Stress: No Stress Concern Present   Feeling of Stress : Not at all  Social Connections: Moderately Isolated   Frequency of Communication with Friends and Family: More than three times a week   Frequency of Social Gatherings with Friends and Family: More than three times a week   Attends Religious Services: Never   Database administrator or Organizations: No  Attends Banker Meetings: Never   Marital Status: Married  Catering manager Violence: Not At Risk   Fear of Current or Ex-Partner: No   Emotionally Abused: No   Physically Abused: No   Sexually Abused: No     Review of Systems     Objective:   Physical Exam Constitutional:      Appearance: She is well-developed.  HENT:     Right Ear: Tympanic membrane and ear canal normal.     Left Ear: Tympanic membrane and ear canal normal.     Mouth/Throat:     Pharynx: No oropharyngeal exudate or posterior oropharyngeal erythema.  Cardiovascular:     Rate and Rhythm: Normal rate and regular rhythm.     Heart sounds: Normal heart sounds.  Pulmonary:     Effort: Pulmonary effort is normal. No respiratory distress.     Breath sounds: No wheezing or rales.   Musculoskeletal:     Cervical back: Neck supple.  Lymphadenopathy:     Cervical: No cervical adenopathy.          Assessment & Plan:  Hyperglycemia - Plan: Hemoglobin A1c  Vertigo We discussed vertigo and its natural cause and how to perform Epley maneuvers.  Also gave her prescription for scopolamine patches if she needs them in the future as well as Zofran if she needs it in the future.  This time she seems to be much better and gradually improving.  Continue Epley maneuvers until resolved.  I will check an A1c given her random sugar that was elevated

## 2020-12-27 LAB — HEMOGLOBIN A1C
Hgb A1c MFr Bld: 5.3 % of total Hgb (ref <5.7)
Mean Plasma Glucose: 105 mg/dL
eAG (mmol/L): 5.8 mmol/L

## 2021-01-06 DIAGNOSIS — M5431 Sciatica, right side: Secondary | ICD-10-CM | POA: Diagnosis not present

## 2021-01-06 DIAGNOSIS — M9901 Segmental and somatic dysfunction of cervical region: Secondary | ICD-10-CM | POA: Diagnosis not present

## 2021-01-06 DIAGNOSIS — M9903 Segmental and somatic dysfunction of lumbar region: Secondary | ICD-10-CM | POA: Diagnosis not present

## 2021-01-08 DIAGNOSIS — M9903 Segmental and somatic dysfunction of lumbar region: Secondary | ICD-10-CM | POA: Diagnosis not present

## 2021-01-08 DIAGNOSIS — M5431 Sciatica, right side: Secondary | ICD-10-CM | POA: Diagnosis not present

## 2021-01-08 DIAGNOSIS — M9901 Segmental and somatic dysfunction of cervical region: Secondary | ICD-10-CM | POA: Diagnosis not present

## 2021-01-10 ENCOUNTER — Other Ambulatory Visit: Payer: Self-pay

## 2021-01-10 ENCOUNTER — Ambulatory Visit
Admission: RE | Admit: 2021-01-10 | Discharge: 2021-01-10 | Disposition: A | Discharge status: Home / Self Care | Payer: Medicare HMO | Source: Ambulatory Visit | Attending: Family Medicine | Admitting: Family Medicine

## 2021-01-10 DIAGNOSIS — Z1231 Encounter for screening mammogram for malignant neoplasm of breast: Secondary | ICD-10-CM

## 2021-01-12 ENCOUNTER — Telehealth: Payer: Self-pay | Admitting: *Deleted

## 2021-01-12 NOTE — Telephone Encounter (Signed)
Received call from patient.   Reports that vertigo has not resolved and requested referral to ENT.   Please advise.

## 2021-01-13 DIAGNOSIS — M9901 Segmental and somatic dysfunction of cervical region: Secondary | ICD-10-CM | POA: Diagnosis not present

## 2021-01-13 DIAGNOSIS — M9903 Segmental and somatic dysfunction of lumbar region: Secondary | ICD-10-CM | POA: Diagnosis not present

## 2021-01-13 DIAGNOSIS — M5431 Sciatica, right side: Secondary | ICD-10-CM | POA: Diagnosis not present

## 2021-01-13 NOTE — Telephone Encounter (Signed)
From the notes on 12/26/2020, it looks like she was doing them at home. Do you want to refer to PT anyway?

## 2021-01-14 NOTE — Telephone Encounter (Signed)
Call placed to patient and patient made aware.   Agreeable to plan.  

## 2021-01-19 ENCOUNTER — Other Ambulatory Visit: Payer: Self-pay

## 2021-01-19 ENCOUNTER — Ambulatory Visit (INDEPENDENT_AMBULATORY_CARE_PROVIDER_SITE_OTHER): Payer: Medicare HMO | Admitting: Family Medicine

## 2021-01-19 ENCOUNTER — Other Ambulatory Visit: Payer: Self-pay | Admitting: *Deleted

## 2021-01-19 ENCOUNTER — Encounter: Payer: Self-pay | Admitting: Family Medicine

## 2021-01-19 ENCOUNTER — Ambulatory Visit: Payer: Medicare HMO | Admitting: Nurse Practitioner

## 2021-01-19 VITALS — BP 130/64 | HR 90 | Temp 98.9°F | Resp 18 | Ht 61.0 in | Wt 159.0 lb

## 2021-01-19 DIAGNOSIS — R270 Ataxia, unspecified: Secondary | ICD-10-CM | POA: Diagnosis not present

## 2021-01-19 DIAGNOSIS — R42 Dizziness and giddiness: Secondary | ICD-10-CM

## 2021-01-19 MED ORDER — HYDROCHLOROTHIAZIDE 12.5 MG PO CAPS: 12.5000 mg | ORAL_CAPSULE | Freq: Every day | ORAL | 1 refills | Status: DC

## 2021-01-19 NOTE — Progress Notes (Signed)
Subjective:    Patient ID: Taylor Singleton, female    DOB: [birth year], 65 y.o.   MRN: [id number] 12/26/20 Over the weekend, the patient developed severe vertigo.  She states that if she rolled over to the left side in bed, the room would start spinning.  She was having such severe vertigo that she could not keep anything down.  She was vomiting.  At 1 point she was sitting on the toilet holding a bucket.  She got extremely dehydrated and had to go to the hospital.  They gave her meclizine and IV fluids at the hospital.  She has been performing Epley maneuvers at home and the symptoms are gradually improving.  She states that she is approximately 90% better today.  She is concerned because her blood sugar at the hospital was elevated at 125.  She has a family history of diabetes and she states that she had not eaten anything for quite some time prior to having that blood drawn although she may have been on IV fluid.  At that time, my plan was:  We discussed vertigo and its natural cause and how to perform Epley maneuvers.  Also gave her prescription for scopolamine patches if she needs them in the future as well as Zofran if she needs it in the future.  This time she seems to be much better and gradually improving.  Continue Epley maneuvers until resolved.  I will check an A1c given her random sugar that was elevated  01/19/21 Patient states the symptoms of gotten worse.  She is no longer having classic vertigo.  She states that she constantly feels off balance and has disequilibrium.  Certainly if she turns her head in certain directions it seems to be worse.  However she is no longer having the sensation of the room spinning however she is having a constant feeling of being out of balance.  She is staggering into walls at home.  Even today in the office, she is having to walk down the hallway with her arm extended to help balance her self against the wall.  Therefore she is demonstrating ataxia.  She is also  having episodes of hearing loss and occasional tinnitus.  She does have a family history of Mnire's disease.  Her father had Mnire's disease.  She denies any permanent hearing loss but she has noticed episodes of tinnitus and perhaps some mild hearing loss when she has episodes of vertigo.  I am very concerned about the ataxia however that she demonstrates today Past Medical History:  Diagnosis Date   Arthritis    HLD (hyperlipidemia)    Past Surgical History:  Procedure Laterality Date   ABDOMINAL HYSTERECTOMY     due to bleeding TAH/BSO   BREAST CYST EXCISION Left    Current Outpatient Medications on File Prior to Visit  Medication Sig Dispense Refill   atorvastatin (LIPITOR) 20 MG tablet Take 1 tablet (20 mg total) by mouth daily. 90 tablet 3   Calcium Carb-Cholecalciferol (CALCIUM/VITAMIN D) 600-400 MG-UNIT TABS Take 2 tablets by mouth daily.     HORSE CHESTNUT PO Take by mouth.     meclizine (ANTIVERT) 25 MG tablet Take 1 tablet (25 mg total) by mouth 3 (three) times daily as needed for dizziness. 30 tablet 0   ondansetron (ZOFRAN) 4 MG tablet Take 1 tablet (4 mg total) by mouth every 8 (eight) hours as needed for nausea or vomiting. 20 tablet 0   scopolamine (TRANSDERM-SCOP, 1.5 MG,) 1 MG/3DAYS Place  1 patch (1.5 mg total) onto the skin every 3 (three) days. 10 patch 12   No current facility-administered medications on file prior to visit.   No Known Allergies Social History   Socioeconomic History   Marital status: Married    Spouse name: Not on file   Number of children: 2   Years of education: Not on file   Highest education level: Not on file  Occupational History   Not on file  Tobacco Use   Smoking status: Never   Smokeless tobacco: Never  Vaping Use   Vaping Use: Never used  Substance and Sexual Activity   Alcohol use: No   Drug use: No   Sexual activity: Not Currently    Birth control/protection: Surgical  Other Topics Concern   Not on file  Social  History Narrative   Not on file   Social Determinants of Health   Financial Resource Strain: Low Risk    Difficulty of Paying Living Expenses: Not hard at all  Food Insecurity: No Food Insecurity   Worried About Programme researcher, broadcasting/film/video in the Last Year: Never true   Ran Out of Food in the Last Year: Never true  Transportation Needs: No Transportation Needs   Lack of Transportation (Medical): No   Lack of Transportation (Non-Medical): No  Physical Activity: Sufficiently Active   Days of Exercise per Week: 7 days   Minutes of Exercise per Session: 30 min  Stress: No Stress Concern Present   Feeling of Stress : Not at all  Social Connections: Moderately Isolated   Frequency of Communication with Friends and Family: More than three times a week   Frequency of Social Gatherings with Friends and Family: More than three times a week   Attends Religious Services: Never   Database administrator or Organizations: No   Attends Engineer, structural: Never   Marital Status: Married  Catering manager Violence: Not At Risk   Fear of Current or Ex-Partner: No   Emotionally Abused: No   Physically Abused: No   Sexually Abused: No     Review of Systems  All other systems reviewed and are negative.     Objective:   Physical Exam Constitutional:      General: She is not in acute distress.    Appearance: Normal appearance. She is well-developed and normal weight. She is not ill-appearing or toxic-appearing.  HENT:     Right Ear: Tympanic membrane and ear canal normal.     Left Ear: Tympanic membrane and ear canal normal.     Mouth/Throat:     Pharynx: No oropharyngeal exudate or posterior oropharyngeal erythema.  Cardiovascular:     Rate and Rhythm: Normal rate and regular rhythm.     Heart sounds: Normal heart sounds.  Pulmonary:     Effort: Pulmonary effort is normal. No respiratory distress.     Breath sounds: No wheezing or rales.  Musculoskeletal:     Cervical back: Neck  supple.  Lymphadenopathy:     Cervical: No cervical adenopathy.  Neurological:     Mental Status: She is alert and oriented to person, place, and time.     Cranial Nerves: No cranial nerve deficit.     Sensory: No sensory deficit.     Motor: No weakness.     Gait: Gait abnormal.  Psychiatric:        Mood and Affect: Mood normal.        Behavior: Behavior normal.  Thought Content: Thought content normal.        Judgment: Judgment normal.          Assessment & Plan:  Dysequilibrium  Ataxia Patient was having vertigo.  However now she is having symptoms of ataxia.  She is having to hold onto walls at home to walk so that she does not lose her balance and fall.  Even here today she is having to walk with her fingers extended against the wall to help maintain her balance.  Otherwise she has a unsteady gait and staggers from side to side.  Furthermore the "vertigo" has become constant and unrelated to position changes.  Therefore I am going to obtain an MRI of the brain to evaluate further.  If the MRI of the brain is abnormal we will certainly consult neurology.  If the MRI is normal, I am also going to try the patient on hydrochlorothiazide empirically to see if there may be an element of Mnire's disease.  Consider referral to ENT if patient improves on hydrochlorothiazide.  If none of the above apply, will refer the patient for formal Epley maneuvers

## 2021-01-27 DIAGNOSIS — M9903 Segmental and somatic dysfunction of lumbar region: Secondary | ICD-10-CM | POA: Diagnosis not present

## 2021-01-27 DIAGNOSIS — M5431 Sciatica, right side: Secondary | ICD-10-CM | POA: Diagnosis not present

## 2021-01-27 DIAGNOSIS — M9901 Segmental and somatic dysfunction of cervical region: Secondary | ICD-10-CM | POA: Diagnosis not present

## 2021-01-28 DIAGNOSIS — M9903 Segmental and somatic dysfunction of lumbar region: Secondary | ICD-10-CM | POA: Diagnosis not present

## 2021-01-28 DIAGNOSIS — M5431 Sciatica, right side: Secondary | ICD-10-CM | POA: Diagnosis not present

## 2021-01-28 DIAGNOSIS — M9901 Segmental and somatic dysfunction of cervical region: Secondary | ICD-10-CM | POA: Diagnosis not present

## 2021-02-01 ENCOUNTER — Other Ambulatory Visit: Payer: Medicare HMO

## 2021-02-02 ENCOUNTER — Telehealth: Payer: Self-pay | Admitting: Family Medicine

## 2021-02-02 NOTE — Telephone Encounter (Signed)
Received call from [facility] with [facility]; requesting call back. Patient's authorization denied. Calling to see if provider wants to do a pier to pier or send patient elsewhere. Please advise [place] at [phone].

## 2021-02-03 ENCOUNTER — Encounter: Payer: Self-pay | Admitting: Neurology

## 2021-02-03 ENCOUNTER — Ambulatory Visit (INDEPENDENT_AMBULATORY_CARE_PROVIDER_SITE_OTHER): Payer: Medicare HMO | Admitting: Family Medicine

## 2021-02-03 ENCOUNTER — Other Ambulatory Visit: Payer: Self-pay

## 2021-02-03 VITALS — BP 124/68 | HR 75 | Temp 97.3°F | Ht 61.0 in | Wt 160.0 lb

## 2021-02-03 DIAGNOSIS — R42 Dizziness and giddiness: Secondary | ICD-10-CM

## 2021-02-03 DIAGNOSIS — M9903 Segmental and somatic dysfunction of lumbar region: Secondary | ICD-10-CM | POA: Diagnosis not present

## 2021-02-03 DIAGNOSIS — R270 Ataxia, unspecified: Secondary | ICD-10-CM

## 2021-02-03 DIAGNOSIS — M9901 Segmental and somatic dysfunction of cervical region: Secondary | ICD-10-CM | POA: Diagnosis not present

## 2021-02-03 DIAGNOSIS — Z136 Encounter for screening for cardiovascular disorders: Secondary | ICD-10-CM | POA: Diagnosis not present

## 2021-02-03 DIAGNOSIS — Z13220 Encounter for screening for lipoid disorders: Secondary | ICD-10-CM | POA: Diagnosis not present

## 2021-02-03 DIAGNOSIS — M5431 Sciatica, right side: Secondary | ICD-10-CM | POA: Diagnosis not present

## 2021-02-03 LAB — COMPLETE METABOLIC PANEL WITH GFR
AG Ratio: 1.9 (calc) (ref 1.0–2.5)
ALT: 28 U/L (ref 6–29)
AST: 22 U/L (ref 10–35)
Albumin: 4.4 g/dL (ref 3.6–5.1)
Alkaline phosphatase (APISO): 75 U/L (ref 37–153)
BUN: 18 mg/dL (ref 7–25)
CO2: 28 mmol/L (ref 20–32)
Calcium: 9.8 mg/dL (ref 8.6–10.4)
Chloride: 105 mmol/L (ref 98–110)
Creat: 0.72 mg/dL (ref 0.50–1.05)
Globulin: 2.3 g/dL (calc) (ref 1.9–3.7)
Glucose, Bld: 91 mg/dL (ref 65–99)
Potassium: 4.2 mmol/L (ref 3.5–5.3)
Sodium: 141 mmol/L (ref 135–146)
Total Bilirubin: 1.2 mg/dL (ref 0.2–1.2)
Total Protein: 6.7 g/dL (ref 6.1–8.1)
eGFR: 92 mL/min/{1.73_m2} (ref >=60)

## 2021-02-03 LAB — LIPID PANEL
Cholesterol: 197 mg/dL (ref <200)
HDL: 51 mg/dL (ref >=50)
LDL Cholesterol (Calc): 125 mg/dL (calc) — ABNORMAL HIGH
Non-HDL Cholesterol (Calc): 146 mg/dL (calc) — ABNORMAL HIGH (ref <130)
Total CHOL/HDL Ratio: 3.9 (calc) (ref <5.0)
Triglycerides: 107 mg/dL (ref <150)

## 2021-02-03 NOTE — Progress Notes (Signed)
Subjective:    Patient ID: Taylor Singleton, female    DOB: Apr 19, 1956, 65 y.o.   MRN: [id number] 12/26/20 Over the weekend, the patient developed severe vertigo.  She states that if she rolled over to the left side in bed, the room would start spinning.  She was having such severe vertigo that she could not keep anything down.  She was vomiting.  At 1 point she was sitting on the toilet holding a bucket.  She got extremely dehydrated and had to go to the hospital.  They gave her meclizine and IV fluids at the hospital.  She has been performing Epley maneuvers at home and the symptoms are gradually improving.  She states that she is approximately 90% better today.  She is concerned because her blood sugar at the hospital was elevated at 125.  She has a family history of diabetes and she states that she had not eaten anything for quite some time prior to having that blood drawn although she may have been on IV fluid.  At that time, my plan was:  We discussed vertigo and its natural cause and how to perform Epley maneuvers.  Also gave her prescription for scopolamine patches if she needs them in the future as well as Zofran if she needs it in the future.  This time she seems to be much better and gradually improving.  Continue Epley maneuvers until resolved.  I will check an A1c given her random sugar that was elevated  01/19/21 Patient states the symptoms of gotten worse.  She is no longer having classic vertigo.  She states that she constantly feels off balance and has disequilibrium.  Certainly if she turns her head in certain directions it seems to be worse.  However she is no longer having the sensation of the room spinning however she is having a constant feeling of being out of balance.  She is staggering into walls at home.  Even today in the office, she is having to walk down the hallway with her arm extended to help balance her self against the wall.  Therefore she is demonstrating ataxia.  She is also  having episodes of hearing loss and occasional tinnitus.  She does have a family history of Mnire's disease.  Her father had Mnire's disease.  She denies any permanent hearing loss but she has noticed episodes of tinnitus and perhaps some mild hearing loss when she has episodes of vertigo.  I am very concerned about the ataxia however that she demonstrates today.  At that time, my plan was:  Patient was having vertigo.  However now she is having symptoms of ataxia.  She is having to hold onto walls at home to walk so that she does not lose her balance and fall.  Even here today she is having to walk with her fingers extended against the wall to help maintain her balance.  Otherwise she has a unsteady gait and staggers from side to side.  Furthermore the "vertigo" has become constant and unrelated to position changes.  Therefore I am going to obtain an MRI of the brain to evaluate further.  If the MRI of the brain is abnormal we will certainly consult neurology.  If the MRI is normal, I am also going to try the patient on hydrochlorothiazide empirically to see if there may be an element of Mnire's disease.  Consider referral to ENT if patient improves on hydrochlorothiazide.  If none of the above apply, will refer the patient  for formal Epley maneuvers  02/03/21 Insurance denied the MRI of the brain.  Patient is here today to discuss situation.  She continues to have vertigo.  This typically occurs if she turns her head to the left.  She also has some ataxia.  She states that she has to hold onto walls occasionally so that she does not fall.  Given the fact that insurance has denied the MRI, I have recommended that we get the patient into see a neurologist for a second opinion.  We also discussed ENT today.  I tried the patient on hydrochlorothiazide empirically to see if this potentially could be Mnire's disease however she has seen no benefit since starting hydrochlorothiazide.  She continues to have  episodic vertigo and dizziness and constant disequilibrium.  She denies any syncope or presyncope.  She continues to do Epley maneuvers on a daily basis without any benefit. Past Medical History:  Diagnosis Date   Arthritis    HLD (hyperlipidemia)    Past Surgical History:  Procedure Laterality Date   ABDOMINAL HYSTERECTOMY     due to bleeding TAH/BSO   BREAST CYST EXCISION Left    Current Outpatient Medications on File Prior to Visit  Medication Sig Dispense Refill   atorvastatin (LIPITOR) 20 MG tablet Take 1 tablet (20 mg total) by mouth daily. 90 tablet 3   Calcium Carb-Cholecalciferol (CALCIUM/VITAMIN D) 600-400 MG-UNIT TABS Take 2 tablets by mouth daily.     HORSE CHESTNUT PO Take by mouth.     meclizine (ANTIVERT) 25 MG tablet Take 1 tablet (25 mg total) by mouth 3 (three) times daily as needed for dizziness. 30 tablet 0   ondansetron (ZOFRAN) 4 MG tablet Take 1 tablet (4 mg total) by mouth every 8 (eight) hours as needed for nausea or vomiting. 20 tablet 0   scopolamine (TRANSDERM-SCOP, 1.5 MG,) 1 MG/3DAYS Place 1 patch (1.5 mg total) onto the skin every 3 (three) days. 10 patch 12   hydrochlorothiazide (MICROZIDE) 12.5 MG capsule Take 1 capsule (12.5 mg total) by mouth daily. (Patient not taking: Reported on 02/03/2021) 30 capsule 1   No current facility-administered medications on file prior to visit.   No Known Allergies Social History   Socioeconomic History   Marital status: Married    Spouse name: Not on file   Number of children: 2   Years of education: Not on file   Highest education level: Not on file  Occupational History   Not on file  Tobacco Use   Smoking status: Never   Smokeless tobacco: Never  Vaping Use   Vaping Use: Never used  Substance and Sexual Activity   Alcohol use: No   Drug use: No   Sexual activity: Not Currently    Birth control/protection: Surgical  Other Topics Concern   Not on file  Social History Narrative   Not on file   Social  Determinants of Health   Financial Resource Strain: Low Risk    Difficulty of Paying Living Expenses: Not hard at all  Food Insecurity: No Food Insecurity   Worried About Programme researcher, broadcasting/film/video in the Last Year: Never true   Ran Out of Food in the Last Year: Never true  Transportation Needs: No Transportation Needs   Lack of Transportation (Medical): No   Lack of Transportation (Non-Medical): No  Physical Activity: Sufficiently Active   Days of Exercise per Week: 7 days   Minutes of Exercise per Session: 30 min  Stress: No Stress Concern Present  Feeling of Stress : Not at all  Social Connections: Moderately Isolated   Frequency of Communication with Friends and Family: More than three times a week   Frequency of Social Gatherings with Friends and Family: More than three times a week   Attends Religious Services: Never   Database administrator or Organizations: No   Attends Engineer, structural: Never   Marital Status: Married  Catering manager Violence: Not At Risk   Fear of Current or Ex-Partner: No   Emotionally Abused: No   Physically Abused: No   Sexually Abused: No     Review of Systems  All other systems reviewed and are negative.     Objective:   Physical Exam Constitutional:      General: She is not in acute distress.    Appearance: Normal appearance. She is well-developed and normal weight. She is not ill-appearing or toxic-appearing.  HENT:     Right Ear: Tympanic membrane and ear canal normal.     Left Ear: Tympanic membrane and ear canal normal.     Mouth/Throat:     Pharynx: No oropharyngeal exudate or posterior oropharyngeal erythema.  Cardiovascular:     Rate and Rhythm: Normal rate and regular rhythm.     Heart sounds: Normal heart sounds.  Pulmonary:     Effort: Pulmonary effort is normal. No respiratory distress.     Breath sounds: No wheezing or rales.  Musculoskeletal:     Cervical back: Neck supple.  Lymphadenopathy:     Cervical: No  cervical adenopathy.  Neurological:     Mental Status: She is alert and oriented to person, place, and time.     Cranial Nerves: No cranial nerve deficit.     Sensory: No sensory deficit.     Motor: No weakness.     Gait: Gait abnormal.  Psychiatric:        Mood and Affect: Mood normal.        Behavior: Behavior normal.        Thought Content: Thought content normal.        Judgment: Judgment normal.          Assessment & Plan:  Screening cholesterol level - Plan: Lipid panel, COMPLETE METABOLIC PANEL WITH GFR  Vertigo  Dysequilibrium  Ataxia Given the fact insurance has declined the MRI, I am going to consult neurology for a second opinion given the persistence of her vertigo.  Perhaps they would have better success in getting insurance to cover the MRI if they feel it is necessary.  Patient would also like to see ENT for a second opinion.  Therefore I will gladly arrange both of these consultations in accordance with the patient's wishes.  I still feel that most likely the patient has BPPV given the positional nature however this has gone on quite some time and is not improving despite conservative therapy so I would like to arrange second opinions.  I will check a CMP and a lipid panel while the patient is here in fasting as part of her regular preventative care

## 2021-02-04 DIAGNOSIS — M9901 Segmental and somatic dysfunction of cervical region: Secondary | ICD-10-CM | POA: Diagnosis not present

## 2021-02-04 DIAGNOSIS — M9903 Segmental and somatic dysfunction of lumbar region: Secondary | ICD-10-CM | POA: Diagnosis not present

## 2021-02-04 DIAGNOSIS — M5431 Sciatica, right side: Secondary | ICD-10-CM | POA: Diagnosis not present

## 2021-02-06 ENCOUNTER — Telehealth: Payer: Self-pay | Admitting: *Deleted

## 2021-02-06 MED ORDER — ONDANSETRON HCL 4 MG PO TABS: 4.0000 mg | ORAL_TABLET | Freq: Three times a day (TID) | ORAL | 0 refills | Status: DC | PRN

## 2021-02-06 NOTE — Telephone Encounter (Signed)
Received call from patient.   Requested refill on Zofran. Prescription sent to pharmacy.   Also requested referral to ENT for evaluation for Meniere's disease. Please advise.

## 2021-02-09 DIAGNOSIS — M9901 Segmental and somatic dysfunction of cervical region: Secondary | ICD-10-CM | POA: Diagnosis not present

## 2021-02-09 DIAGNOSIS — M5431 Sciatica, right side: Secondary | ICD-10-CM | POA: Diagnosis not present

## 2021-02-09 DIAGNOSIS — M9903 Segmental and somatic dysfunction of lumbar region: Secondary | ICD-10-CM | POA: Diagnosis not present

## 2021-02-09 NOTE — Telephone Encounter (Signed)
Call placed to patient and patient made aware.   States that she is scheduled for neurology appointment in November 2022. States that she would like to be seen sooner.   Also notes that no appointment from ENT has been made. Reviewed referral notes and noted: Note   Our office no longer sees vertigo patients. Dr. Ezzard Standing is retiring and our schedule is booked.     Please advise.

## 2021-02-10 ENCOUNTER — Other Ambulatory Visit: Payer: Self-pay | Admitting: Family Medicine

## 2021-02-11 DIAGNOSIS — M5431 Sciatica, right side: Secondary | ICD-10-CM | POA: Diagnosis not present

## 2021-02-11 DIAGNOSIS — M9901 Segmental and somatic dysfunction of cervical region: Secondary | ICD-10-CM | POA: Diagnosis not present

## 2021-02-11 DIAGNOSIS — M9903 Segmental and somatic dysfunction of lumbar region: Secondary | ICD-10-CM | POA: Diagnosis not present

## 2021-02-12 DIAGNOSIS — M9901 Segmental and somatic dysfunction of cervical region: Secondary | ICD-10-CM | POA: Diagnosis not present

## 2021-02-12 DIAGNOSIS — M9903 Segmental and somatic dysfunction of lumbar region: Secondary | ICD-10-CM | POA: Diagnosis not present

## 2021-02-12 DIAGNOSIS — M5431 Sciatica, right side: Secondary | ICD-10-CM | POA: Diagnosis not present

## 2021-02-20 ENCOUNTER — Other Ambulatory Visit: Payer: Self-pay | Admitting: Family Medicine

## 2021-02-20 DIAGNOSIS — E2839 Other primary ovarian failure: Secondary | ICD-10-CM

## 2021-02-23 ENCOUNTER — Other Ambulatory Visit: Payer: Self-pay | Admitting: Family Medicine

## 2021-02-24 DIAGNOSIS — M5431 Sciatica, right side: Secondary | ICD-10-CM | POA: Diagnosis not present

## 2021-02-24 DIAGNOSIS — M9903 Segmental and somatic dysfunction of lumbar region: Secondary | ICD-10-CM | POA: Diagnosis not present

## 2021-02-24 DIAGNOSIS — M9901 Segmental and somatic dysfunction of cervical region: Secondary | ICD-10-CM | POA: Diagnosis not present

## 2021-02-25 ENCOUNTER — Other Ambulatory Visit: Payer: Self-pay

## 2021-02-25 ENCOUNTER — Ambulatory Visit
Admission: RE | Admit: 2021-02-25 | Discharge: 2021-02-25 | Disposition: A | Discharge status: Home / Self Care | Payer: Medicare HMO | Source: Ambulatory Visit | Attending: Family Medicine | Admitting: Family Medicine

## 2021-02-25 DIAGNOSIS — E2839 Other primary ovarian failure: Secondary | ICD-10-CM

## 2021-02-25 DIAGNOSIS — M85851 Other specified disorders of bone density and structure, right thigh: Secondary | ICD-10-CM | POA: Diagnosis not present

## 2021-02-25 DIAGNOSIS — M810 Age-related osteoporosis without current pathological fracture: Secondary | ICD-10-CM | POA: Diagnosis not present

## 2021-02-25 DIAGNOSIS — Z780 Asymptomatic menopausal state: Secondary | ICD-10-CM | POA: Diagnosis not present

## 2021-02-26 ENCOUNTER — Encounter: Payer: Self-pay | Admitting: Family Medicine

## 2021-02-27 ENCOUNTER — Other Ambulatory Visit: Payer: Self-pay | Admitting: Family Medicine

## 2021-02-27 MED ORDER — ALENDRONATE SODIUM 70 MG PO TABS: 70.0000 mg | ORAL_TABLET | ORAL | 11 refills | Status: DC

## 2021-03-02 DIAGNOSIS — M5431 Sciatica, right side: Secondary | ICD-10-CM | POA: Diagnosis not present

## 2021-03-02 DIAGNOSIS — M9901 Segmental and somatic dysfunction of cervical region: Secondary | ICD-10-CM | POA: Diagnosis not present

## 2021-03-02 DIAGNOSIS — M9903 Segmental and somatic dysfunction of lumbar region: Secondary | ICD-10-CM | POA: Diagnosis not present

## 2021-03-03 DIAGNOSIS — M5431 Sciatica, right side: Secondary | ICD-10-CM | POA: Diagnosis not present

## 2021-03-03 DIAGNOSIS — M9901 Segmental and somatic dysfunction of cervical region: Secondary | ICD-10-CM | POA: Diagnosis not present

## 2021-03-03 DIAGNOSIS — M9903 Segmental and somatic dysfunction of lumbar region: Secondary | ICD-10-CM | POA: Diagnosis not present

## 2021-03-10 DIAGNOSIS — M9903 Segmental and somatic dysfunction of lumbar region: Secondary | ICD-10-CM | POA: Diagnosis not present

## 2021-03-10 DIAGNOSIS — M5431 Sciatica, right side: Secondary | ICD-10-CM | POA: Diagnosis not present

## 2021-03-10 DIAGNOSIS — M9901 Segmental and somatic dysfunction of cervical region: Secondary | ICD-10-CM | POA: Diagnosis not present

## 2021-03-20 ENCOUNTER — Other Ambulatory Visit: Payer: Self-pay

## 2021-03-20 ENCOUNTER — Ambulatory Visit (INDEPENDENT_AMBULATORY_CARE_PROVIDER_SITE_OTHER): Payer: Medicare HMO | Admitting: Family Medicine

## 2021-03-20 VITALS — HR 91 | Temp 98.3°F | Resp 14

## 2021-03-20 DIAGNOSIS — M5442 Lumbago with sciatica, left side: Secondary | ICD-10-CM

## 2021-03-20 DIAGNOSIS — G8929 Other chronic pain: Secondary | ICD-10-CM | POA: Diagnosis not present

## 2021-03-20 MED ORDER — MELOXICAM 15 MG PO TABS: 15.0000 mg | ORAL_TABLET | Freq: Every day | ORAL | 0 refills | Status: DC

## 2021-03-20 NOTE — Progress Notes (Signed)
Subjective:    Patient ID: Taylor Singleton, female    DOB: Nov 02, 1955, 65 y.o.   MRN: [id number]  Patient complains of severe back pain around the level of L2-L4 that has been present for years.  She states that she has been seeing a chiropractor off and on for the last few years however the back pain has steadily worsened.  The chiropractor is no longer effective.  It is gotten to the point that standing causes severe pain and left-sided sciatica.  The sciatica has been present now for more than 3 months.  She is no longer able to stand for a shift at work.  She has had to cut her hours at work down to 4 hours because by the end of a 4-hour shift, she is crying due to the intensity of the pain in her lower back and the lumbar radiculopathy.  She reports burning stinging pain radiating into her left posterior gluteus and down her left leg into her left foot.  Again this pain has been present for more than 3 months.  She has been taking ibuprofen daily without relief.  She has been seeing a chiropractor and getting treatments that only last for 24 to 48 hours before the pain returned. Past Medical History:  Diagnosis Date   Arthritis    HLD (hyperlipidemia)    Osteoporosis    Past Surgical History:  Procedure Laterality Date   ABDOMINAL HYSTERECTOMY     due to bleeding TAH/BSO   BREAST CYST EXCISION Left    Current Outpatient Medications on File Prior to Visit  Medication Sig Dispense Refill   alendronate (FOSAMAX) 70 MG tablet Take 1 tablet (70 mg total) by mouth every 7 (seven) days. Take with a full glass of water on an empty stomach. 4 tablet 11   atorvastatin (LIPITOR) 20 MG tablet Take 1 tablet (20 mg total) by mouth daily. 90 tablet 3   Calcium Carb-Cholecalciferol (CALCIUM/VITAMIN D) 600-400 MG-UNIT TABS Take 2 tablets by mouth daily.     HORSE CHESTNUT PO Take by mouth.     meclizine (ANTIVERT) 25 MG tablet Take 1 tablet (25 mg total) by mouth 3 (three) times daily as needed for  dizziness. (Patient not taking: Reported on 03/20/2021) 30 tablet 0   ondansetron (ZOFRAN) 4 MG tablet Take 1 tablet (4 mg total) by mouth every 8 (eight) hours as needed for nausea or vomiting. (Patient not taking: Reported on 03/20/2021) 20 tablet 0   scopolamine (TRANSDERM-SCOP, 1.5 MG,) 1 MG/3DAYS Place 1 patch (1.5 mg total) onto the skin every 3 (three) days. (Patient not taking: Reported on 03/20/2021) 10 patch 12   No current facility-administered medications on file prior to visit.   No Known Allergies Social History   Socioeconomic History   Marital status: Married    Spouse name: Not on file   Number of children: 2   Years of education: Not on file   Highest education level: Not on file  Occupational History   Not on file  Tobacco Use   Smoking status: Never   Smokeless tobacco: Never  Vaping Use   Vaping Use: Never used  Substance and Sexual Activity   Alcohol use: No   Drug use: No   Sexual activity: Not Currently    Birth control/protection: Surgical  Other Topics Concern   Not on file  Social History Narrative   Not on file   Social Determinants of Health   Financial Resource Strain: Low Risk  Difficulty of Paying Living Expenses: Not hard at all  Food Insecurity: No Food Insecurity   Worried About Running Out of Food in the Last Year: Never true   Ran Out of Food in the Last Year: Never true  Transportation Needs: No Transportation Needs   Lack of Transportation (Medical): No   Lack of Transportation (Non-Medical): No  Physical Activity: Sufficiently Active   Days of Exercise per Week: 7 days   Minutes of Exercise per Session: 30 min  Stress: No Stress Concern Present   Feeling of Stress : Not at all  Social Connections: Moderately Isolated   Frequency of Communication with Friends and Family: More than three times a week   Frequency of Social Gatherings with Friends and Family: More than three times a week   Attends Religious Services: Never   Automotive engineer or Organizations: No   Attends Engineer, structural: Never   Marital Status: Married  Catering manager Violence: Not At Risk   Fear of Current or Ex-Partner: No   Emotionally Abused: No   Physically Abused: No   Sexually Abused: No     Review of Systems     Objective:   Physical Exam Constitutional:      Appearance: She is well-developed.  HENT:     Right Ear: Tympanic membrane and ear canal normal.     Left Ear: Tympanic membrane and ear canal normal.     Mouth/Throat:     Pharynx: No oropharyngeal exudate or posterior oropharyngeal erythema.  Cardiovascular:     Rate and Rhythm: Normal rate and regular rhythm.     Heart sounds: Normal heart sounds.  Pulmonary:     Effort: Pulmonary effort is normal. No respiratory distress.     Breath sounds: No wheezing or rales.  Musculoskeletal:     Cervical back: Neck supple.     Lumbar back: Tenderness present. No spasms. Decreased range of motion.       Back:  Lymphadenopathy:     Cervical: No cervical adenopathy.          Assessment & Plan:  Chronic left-sided low back pain with left-sided sciatica Begin meloxicam 15 mg a day.  I will schedule the patient for an MRI to evaluate the lumbar spine given the presence of sciatica for more than 3 months having failed conservative therapy with NSAIDs as well as a chiropractor.

## 2021-04-03 ENCOUNTER — Other Ambulatory Visit: Payer: Self-pay | Admitting: Family Medicine

## 2021-04-03 DIAGNOSIS — M5432 Sciatica, left side: Secondary | ICD-10-CM

## 2021-04-05 ENCOUNTER — Other Ambulatory Visit: Payer: Medicare HMO

## 2021-04-10 ENCOUNTER — Ambulatory Visit: Payer: Self-pay

## 2021-04-10 ENCOUNTER — Ambulatory Visit (INDEPENDENT_AMBULATORY_CARE_PROVIDER_SITE_OTHER): Payer: Medicare HMO | Admitting: Surgical

## 2021-04-10 ENCOUNTER — Encounter: Payer: Self-pay | Admitting: Surgical

## 2021-04-10 ENCOUNTER — Other Ambulatory Visit: Payer: Self-pay

## 2021-04-10 DIAGNOSIS — M546 Pain in thoracic spine: Secondary | ICD-10-CM

## 2021-04-10 DIAGNOSIS — M545 Low back pain, unspecified: Secondary | ICD-10-CM

## 2021-04-12 ENCOUNTER — Encounter: Payer: Self-pay | Admitting: Surgical

## 2021-04-12 NOTE — Progress Notes (Signed)
Office Visit Note   Patient: Taylor Singleton           Date of Birth: 05/18/1956           MRN: [id number] Visit Date: 04/10/2021 Requested by: Donita Brooks, MD [id number] [place],  [place] [address] PCP: Donita Brooks, MD  Subjective: Chief Complaint  Patient presents with   Lower Back - Follow-up    HPI: Taylor Singleton is a 65 y.o. female who presents to the office complaining of mid and low back pain with left leg radicular pain.  Patient notes 13 years of back pain without any significant injury.  Localizes pain to the mid thoracic spine as well as the lumbar spine.  Most of the pain she experiences in her low back.  Pain gets severe to the point where it causes her to want to cry at times.  She denies any groin pain or neck pain.  No radicular pain down her arms but she does have some radiation of pain around from the thoracic spine to the lateral aspect of her thorax without any actual radiation into the anterior chest wall.  She does note radicular pain down her left leg that travels from the low back into the buttocks and down the posterior leg into the knee with occasional radiation further down to the left foot.  She has foot numbness and tingling.  She works at [employer]; she really only works here because she likes to talk with people and its more of a social outlet for her.  However, the constant leaning over to put groceries in her bags causes severe pain for her after several hours.  She has had no previous back surgeries.  She takes meloxicam and horse Chestnut to help with inflammation with mild relief of her symptoms.  She has had previous treatment with chiropractor but she feels that this has been insufficient for her.  She was also had physical therapy several years ago and has been keeping up with a home exercise program without any relief.  She denies any recent MRI scans.  She has had prior ESI's by her description that have helped but this was 5+ years ago.   She has recent DEXA scan demonstrating osteopenia of her femurs with osteoporosis of her lumbar spine.  She is on alendronate once weekly that was prescribed by her primary care physician, Dr. Tanya Nones.                ROS: All systems reviewed are negative as they relate to the chief complaint within the history of present illness.  Patient denies fevers or chills.  Assessment & Plan: Visit Diagnoses:  1. Low back pain, unspecified back pain laterality, unspecified chronicity, unspecified whether sciatica present   2. Pain in thoracic spine     Plan: Patient is a 65 year old female who presents for evaluation of mid and low back pain.  She has had pain for over a decade.  She has a component of left lower extremity radicular pain but the main complaint she has is midline low back pain.  This is the pain that is bringing her near to tears when she tries to do her job duties at [employer].  She states that physical therapy, home exercise program, chiropractor has not yielded any lasting relief.  She would like to try anything that will give her some relief.  She has had injections in the past that have provided good relief  according to her history.  However this has not been for 5+ years.  She has radiographs today demonstrating hyperkyphosis of the upper thoracic spine with disc space narrowing and endplate degenerative changes at multiple levels as well as mild thoracolumbar scoliosis.  Mild degenerative changes of the lumbar disc spaces noted as well with moderate to severe facet arthrosis of the inferior lumbar spine.  Plan to order MRI of the thoracic spine and MRI of the lumbar spine for further evaluation with likely ESI's to follow based on her good response in the past.  Follow-up after MRIs to review results.  Follow-Up Instructions: No follow-ups on file.   Orders:  Orders Placed This Encounter  Procedures   XR Lumbar Spine 2-3 Views   XR Thoracic Spine 2 View   MR Thoracic Spine w/o  contrast   MR Lumbar Spine w/o contrast   No orders of the defined types were placed in this encounter.     Procedures: No procedures performed   Clinical Data: No additional findings.  Objective: Vital Signs: There were no vitals taken for this visit.  Physical Exam:  Constitutional: Patient appears well-developed HEENT:  Head: Normocephalic Eyes:EOM are normal Neck: Normal range of motion Cardiovascular: Normal rate Pulmonary/chest: Effort normal Neurologic: Patient is alert Skin: Skin is warm Psychiatric: Patient has normal mood and affect  Ortho Exam: Ortho exam demonstrates tenderness throughout the axial lumbar spine around L4-L5 and throughout the upper thoracic spine.  5/5 motor strength of bilateral hip flexion, quadricep, hamstring, dorsiflexion, plantarflexion.  No evidence of clonus bilaterally.  3+ patellar tendon reflexes bilaterally.  Negative straight leg raise on left and right.  Mildly increased pain with extension.  No increased pain with flexion.  No pain with hip range of motion bilaterally.  Negative visual sign bilaterally.  Specialty Comments:  No specialty comments available.  Imaging: No results found.   PMFS History: Patient Active Problem List   Diagnosis Date Noted   HLD (hyperlipidemia)    Past Medical History:  Diagnosis Date   Arthritis    HLD (hyperlipidemia)    Osteoporosis     Family History  Problem Relation Age of Onset   Diabetes Mother    Heart attack Brother     Past Surgical History:  Procedure Laterality Date   ABDOMINAL HYSTERECTOMY     due to bleeding TAH/BSO   BREAST CYST EXCISION Left    Social History   Occupational History   Not on file  Tobacco Use   Smoking status: Never   Smokeless tobacco: Never  Vaping Use   Vaping Use: Never used  Substance and Sexual Activity   Alcohol use: No   Drug use: No   Sexual activity: Not Currently    Birth control/protection: Surgical

## 2021-04-16 ENCOUNTER — Other Ambulatory Visit: Payer: Self-pay | Admitting: Family Medicine

## 2021-04-28 ENCOUNTER — Ambulatory Visit
Admission: RE | Admit: 2021-04-28 | Discharge: 2021-04-28 | Disposition: A | Discharge status: Home / Self Care | Payer: Medicare HMO | Source: Ambulatory Visit | Attending: Surgical | Admitting: Surgical

## 2021-04-28 ENCOUNTER — Other Ambulatory Visit: Payer: Self-pay

## 2021-04-28 ENCOUNTER — Ambulatory Visit: Payer: Medicare HMO | Admitting: Neurology

## 2021-04-28 DIAGNOSIS — M47814 Spondylosis without myelopathy or radiculopathy, thoracic region: Secondary | ICD-10-CM | POA: Diagnosis not present

## 2021-04-28 DIAGNOSIS — M5136 Other intervertebral disc degeneration, lumbar region: Secondary | ICD-10-CM | POA: Diagnosis not present

## 2021-04-28 DIAGNOSIS — M2578 Osteophyte, vertebrae: Secondary | ICD-10-CM | POA: Diagnosis not present

## 2021-04-28 DIAGNOSIS — M4804 Spinal stenosis, thoracic region: Secondary | ICD-10-CM | POA: Diagnosis not present

## 2021-04-28 DIAGNOSIS — M546 Pain in thoracic spine: Secondary | ICD-10-CM

## 2021-04-28 DIAGNOSIS — M545 Low back pain, unspecified: Secondary | ICD-10-CM

## 2021-04-28 DIAGNOSIS — M48061 Spinal stenosis, lumbar region without neurogenic claudication: Secondary | ICD-10-CM | POA: Diagnosis not present

## 2021-04-28 DIAGNOSIS — M5124 Other intervertebral disc displacement, thoracic region: Secondary | ICD-10-CM | POA: Diagnosis not present

## 2021-05-11 ENCOUNTER — Ambulatory Visit: Payer: Medicare HMO | Admitting: Orthopedic Surgery

## 2021-05-14 ENCOUNTER — Other Ambulatory Visit: Payer: Self-pay | Admitting: Family Medicine

## 2021-05-15 ENCOUNTER — Ambulatory Visit: Payer: Medicare HMO | Admitting: Surgical

## 2021-05-15 ENCOUNTER — Other Ambulatory Visit: Payer: Self-pay

## 2021-05-15 ENCOUNTER — Encounter: Payer: Self-pay | Admitting: Orthopedic Surgery

## 2021-05-15 DIAGNOSIS — M5134 Other intervertebral disc degeneration, thoracic region: Secondary | ICD-10-CM | POA: Diagnosis not present

## 2021-05-15 DIAGNOSIS — M48061 Spinal stenosis, lumbar region without neurogenic claudication: Secondary | ICD-10-CM

## 2021-05-15 DIAGNOSIS — M47816 Spondylosis without myelopathy or radiculopathy, lumbar region: Secondary | ICD-10-CM

## 2021-05-15 NOTE — Progress Notes (Signed)
Office Visit Note   Patient: Taylor Singleton           Date of Birth: 1956/06/07           MRN: [id number] Visit Date: 05/15/2021 Requested by: Donita Brooks, MD [address] [place],  [place] [address] PCP: Donita Brooks, MD  Subjective: Chief Complaint  Patient presents with   Other    Scan review    HPI: Taylor Singleton is a 65 y.o. female who presents to the office for MRI review. Continues to complain mainly of upper and lower back pain with occasional left leg radicular pain down to her left foot.  Pain is mildly improved compared with last visit.  She denies any weakness in her legs.  No bowel or bladder incontinence.  MRI results revealed: MR Thoracic Spine w/o contrast  Result Date: 04/28/2021 CLINICAL DATA:  Back pain and bra line extending into low back, left buttock, and left leg EXAM: MRI THORACIC AND LUMBAR SPINE WITHOUT CONTRAST TECHNIQUE: Multiplanar and multiecho pulse sequences of the thoracic and lumbar spine were obtained without intravenous contrast. COMPARISON:  Thoracic and lumbar radiographs 04/10/2021 FINDINGS: MRI THORACIC SPINE FINDINGS Alignment: There is exaggerated thoracic kyphosis. There is trace anterolisthesis of T2 on T3, T3 on T4, and T4 on T5. There is levocurvature centered at the thoracolumbar junction, similar to the prior radiograph. Vertebrae: Vertebral body heights are preserved. There is mild degenerative endplate marrow signal abnormality most notable at T7-T8 and T9-T10. There are flowing anterior osteophytes in the midthoracic spine with partial fusion of the T5 through T7 vertebral bodies. Cord:  Normal in signal and morphology. Paraspinal and other soft tissues: The paraspinal soft tissues are unremarkable. Disc levels: There is multilevel disc desiccation and narrowing in the thoracic spine, most advanced at T7-T8. There are minimal disc protrusions at T3-T4, T7-T8, and T9-T10. There is no significant spinal canal or neural foraminal  stenosis. MRI LUMBAR SPINE FINDINGS Segmentation: A count sequence was not provided. When correlated with the prior radiographs, there are 5 non-rib-bearing lumbar vertebral bodies. The lowest formed disc space is designated L5-S1. Alignment: As above, there is levoscoliosis centered at the thoracolumbar junction. Sagittal alignment is normal. Vertebrae: A focus of T1 and T2 hyperintensity in the L1 vertebral body is consistent with an intraosseous hemangioma. There is no suspicious marrow signal abnormality. Vertebral body heights are preserved. Conus medullaris and cauda equina: Conus extends to the mid L1 level. Conus and cauda equina appear normal. Paraspinal and other soft tissues: The paraspinal soft tissues are unremarkable. The common bile duct is mildly enlarged at 1.1 cm. Disc levels: There is multilevel disc desiccation and narrowing, most advanced at T12-L1, L3-L4, and L4-L5. There is multilevel facet arthropathy, most advanced at L4-L5 and L5-S1, slightly worse on the left at both levels. T12-L1: There is a mild disc bulge, degenerative endplate change, and bilateral facet arthropathy resulting in mild spinal canal narrowing with slight crowding of the left subarticular zone and mild-to-moderate left and no significant right neural foraminal stenosis. L1-L2: Mild degenerative endplate change and bilateral facet arthropathy without significant spinal canal or neural foraminal stenosis. L2-L3: Mild degenerative endplate change and bilateral facet arthropathy without significant spinal canal or neural foraminal stenosis. L3-L4: There is a mild disc bulge with a superimposed left subarticular zone protrusion and degenerative endplate change and mild bilateral facet arthropathy resulting in mild spinal canal stenosis with crowding of the left subarticular zone without definite evidence of nerve root  impingement and mild left worse than right neural foraminal stenosis. L4-L5: There is a diffuse disc bulge  with a right foraminal component, ligamentum flavum thickening, degenerative endplate change and prominent bilateral facet arthropathy resulting in mild to moderate spinal canal stenosis with crowding of the subarticular zones, right worse than left, with possible mass effect on the traversing right L5 nerve root and moderate to severe right and mild left neural foraminal stenosis. L5-S1: There is a mild disc bulge and bilateral facet arthropathy resulting in mild bilateral neural foraminal stenosis without significant spinal canal stenosis. IMPRESSION: MR THORACIC SPINE IMPRESSION 1. Flowing anterior osteophytes with partial fusion of the T5 through T7 vertebral bodies. 2. Trace anterolisthesis of T2 on T3, T3 on T4, and T4 on T5. 3. Otherwise, mild degenerative changes as detailed above without high-grade spinal canal or neural foraminal stenosis. MR LUMBAR SPINE IMPRESSION 1. Degenerative changes in the lumbar spine are most advanced at L4-L5 where there is mild-to-moderate spinal canal stenosis with crowding of the subarticular zones with possible mass effect on the traversing right L5 nerve root, and moderate to severe right and mild left neural foraminal stenosis. 2. Mild crowding of the left subarticular zone and mild-to-moderate left neural foraminal stenosis at T12-L1. No evidence of nerve root impingement 3. Crowding of the left subarticular zone without definite evidence of nerve root impingement and mild left worse than right neural foraminal stenosis at L3-L4. 4. Multilevel facet arthropathy, most advanced at L4-L5 and L5-S1, slightly worse on the left. Mild enlargement of the common bile duct measuring up to 1.1 cm. Correlate with LFTs, and consider dedicated MRCP as indicated. Electronically Signed   By: Lesia Hausen M.D.   On: 04/28/2021 14:53   MR Lumbar Spine w/o contrast  Result Date: 04/28/2021 CLINICAL DATA:  Back pain and bra line extending into low back, left buttock, and left leg EXAM:  MRI THORACIC AND LUMBAR SPINE WITHOUT CONTRAST TECHNIQUE: Multiplanar and multiecho pulse sequences of the thoracic and lumbar spine were obtained without intravenous contrast. COMPARISON:  Thoracic and lumbar radiographs 04/10/2021 FINDINGS: MRI THORACIC SPINE FINDINGS Alignment: There is exaggerated thoracic kyphosis. There is trace anterolisthesis of T2 on T3, T3 on T4, and T4 on T5. There is levocurvature centered at the thoracolumbar junction, similar to the prior radiograph. Vertebrae: Vertebral body heights are preserved. There is mild degenerative endplate marrow signal abnormality most notable at T7-T8 and T9-T10. There are flowing anterior osteophytes in the midthoracic spine with partial fusion of the T5 through T7 vertebral bodies. Cord:  Normal in signal and morphology. Paraspinal and other soft tissues: The paraspinal soft tissues are unremarkable. Disc levels: There is multilevel disc desiccation and narrowing in the thoracic spine, most advanced at T7-T8. There are minimal disc protrusions at T3-T4, T7-T8, and T9-T10. There is no significant spinal canal or neural foraminal stenosis. MRI LUMBAR SPINE FINDINGS Segmentation: A count sequence was not provided. When correlated with the prior radiographs, there are 5 non-rib-bearing lumbar vertebral bodies. The lowest formed disc space is designated L5-S1. Alignment: As above, there is levoscoliosis centered at the thoracolumbar junction. Sagittal alignment is normal. Vertebrae: A focus of T1 and T2 hyperintensity in the L1 vertebral body is consistent with an intraosseous hemangioma. There is no suspicious marrow signal abnormality. Vertebral body heights are preserved. Conus medullaris and cauda equina: Conus extends to the mid L1 level. Conus and cauda equina appear normal. Paraspinal and other soft tissues: The paraspinal soft tissues are unremarkable. The common bile duct is  mildly enlarged at 1.1 cm. Disc levels: There is multilevel disc desiccation  and narrowing, most advanced at T12-L1, L3-L4, and L4-L5. There is multilevel facet arthropathy, most advanced at L4-L5 and L5-S1, slightly worse on the left at both levels. T12-L1: There is a mild disc bulge, degenerative endplate change, and bilateral facet arthropathy resulting in mild spinal canal narrowing with slight crowding of the left subarticular zone and mild-to-moderate left and no significant right neural foraminal stenosis. L1-L2: Mild degenerative endplate change and bilateral facet arthropathy without significant spinal canal or neural foraminal stenosis. L2-L3: Mild degenerative endplate change and bilateral facet arthropathy without significant spinal canal or neural foraminal stenosis. L3-L4: There is a mild disc bulge with a superimposed left subarticular zone protrusion and degenerative endplate change and mild bilateral facet arthropathy resulting in mild spinal canal stenosis with crowding of the left subarticular zone without definite evidence of nerve root impingement and mild left worse than right neural foraminal stenosis. L4-L5: There is a diffuse disc bulge with a right foraminal component, ligamentum flavum thickening, degenerative endplate change and prominent bilateral facet arthropathy resulting in mild to moderate spinal canal stenosis with crowding of the subarticular zones, right worse than left, with possible mass effect on the traversing right L5 nerve root and moderate to severe right and mild left neural foraminal stenosis. L5-S1: There is a mild disc bulge and bilateral facet arthropathy resulting in mild bilateral neural foraminal stenosis without significant spinal canal stenosis. IMPRESSION: MR THORACIC SPINE IMPRESSION 1. Flowing anterior osteophytes with partial fusion of the T5 through T7 vertebral bodies. 2. Trace anterolisthesis of T2 on T3, T3 on T4, and T4 on T5. 3. Otherwise, mild degenerative changes as detailed above without high-grade spinal canal or neural  foraminal stenosis. MR LUMBAR SPINE IMPRESSION 1. Degenerative changes in the lumbar spine are most advanced at L4-L5 where there is mild-to-moderate spinal canal stenosis with crowding of the subarticular zones with possible mass effect on the traversing right L5 nerve root, and moderate to severe right and mild left neural foraminal stenosis. 2. Mild crowding of the left subarticular zone and mild-to-moderate left neural foraminal stenosis at T12-L1. No evidence of nerve root impingement 3. Crowding of the left subarticular zone without definite evidence of nerve root impingement and mild left worse than right neural foraminal stenosis at L3-L4. 4. Multilevel facet arthropathy, most advanced at L4-L5 and L5-S1, slightly worse on the left. Mild enlargement of the common bile duct measuring up to 1.1 cm. Correlate with LFTs, and consider dedicated MRCP as indicated. Electronically Signed   By: Lesia Hausen M.D.   On: 04/28/2021 14:53                 ROS: All systems reviewed are negative as they relate to the chief complaint within the history of present illness.  Patient denies fevers or chills.  Assessment & Plan: Visit Diagnoses:  1. Neuroforaminal stenosis of lumbar spine   2. Facet arthropathy, lumbar   3. Degenerative disc disease, thoracic     Plan: Kamri Gotsch is a 65 y.o. female who presents to the office for MRI review.  She has advanced facet arthropathy most severe at L4-L5 and L5-S1 as well as foraminal stenosis at multiple levels.  Most of her pain by her history seems to be axial lumbar spine pain but she does have some occasional left leg radicular pain but no weakness.  Discussed options available to patient.  She would like to try taking Tylenol arthritis 2-3  times per day as well as trying a formal evaluation by physical therapist and referral to Dr. Alvester Morin for lumbar spine ESI.  Patient agreed with plan.  Follow-up after 4 to 6 weeks for further evaluation if pain  continues.  Follow-Up Instructions: No follow-ups on file.   Orders:  No orders of the defined types were placed in this encounter.  No orders of the defined types were placed in this encounter.     Procedures: No procedures performed   Clinical Data: No additional findings.  Objective: Vital Signs: There were no vitals taken for this visit.  Physical Exam:  Constitutional: Patient appears well-developed HEENT:  Head: Normocephalic Eyes:EOM are normal Neck: Normal range of motion Cardiovascular: Normal rate Pulmonary/chest: Effort normal Neurologic: Patient is alert Skin: Skin is warm Psychiatric: Patient has normal mood and affect  Ortho Exam: No change since previous exam  Specialty Comments:  No specialty comments available.  Imaging: No results found.   PMFS History: Patient Active Problem List   Diagnosis Date Noted   HLD (hyperlipidemia)    Past Medical History:  Diagnosis Date   Arthritis    HLD (hyperlipidemia)    Osteoporosis     Family History  Problem Relation Age of Onset   Diabetes Mother    Heart attack Brother     Past Surgical History:  Procedure Laterality Date   ABDOMINAL HYSTERECTOMY     due to bleeding TAH/BSO   BREAST CYST EXCISION Left    Social History   Occupational History   Not on file  Tobacco Use   Smoking status: Never   Smokeless tobacco: Never  Vaping Use   Vaping Use: Never used  Substance and Sexual Activity   Alcohol use: No   Drug use: No   Sexual activity: Not Currently    Birth control/protection: Surgical

## 2021-05-15 NOTE — Addendum Note (Signed)
Addended byPrescott Parma on: 05/15/2021 11:01 AM   Modules accepted: Orders

## 2021-05-20 ENCOUNTER — Ambulatory Visit: Payer: Medicare HMO | Admitting: Physical Medicine and Rehabilitation

## 2021-05-20 ENCOUNTER — Encounter: Payer: Self-pay | Admitting: Physical Medicine and Rehabilitation

## 2021-05-20 ENCOUNTER — Other Ambulatory Visit: Payer: Self-pay

## 2021-05-20 VITALS — BP 149/75 | HR 116

## 2021-05-20 DIAGNOSIS — G8929 Other chronic pain: Secondary | ICD-10-CM

## 2021-05-20 DIAGNOSIS — M5416 Radiculopathy, lumbar region: Secondary | ICD-10-CM | POA: Diagnosis not present

## 2021-05-20 DIAGNOSIS — M4726 Other spondylosis with radiculopathy, lumbar region: Secondary | ICD-10-CM | POA: Diagnosis not present

## 2021-05-20 DIAGNOSIS — M47816 Spondylosis without myelopathy or radiculopathy, lumbar region: Secondary | ICD-10-CM | POA: Diagnosis not present

## 2021-05-20 DIAGNOSIS — M48062 Spinal stenosis, lumbar region with neurogenic claudication: Secondary | ICD-10-CM

## 2021-05-20 DIAGNOSIS — M7918 Myalgia, other site: Secondary | ICD-10-CM | POA: Diagnosis not present

## 2021-05-20 DIAGNOSIS — M546 Pain in thoracic spine: Secondary | ICD-10-CM

## 2021-05-20 NOTE — Progress Notes (Signed)
Taylor Singleton - 65 y.o. female MRN [id number]  Date of birth: 1955-11-07  Office Visit Note: Visit Date: 05/20/2021 PCP: Donita Brooks, MD Referred by: Julieanne Cotton, PA-C  Subjective: Chief Complaint  Patient presents with   Middle Back - Pain   Lower Back - Pain   Left Leg - Pain   Left Foot - Pain, Tingling   HPI: Taylor Singleton is a 65 y.o. female who comes in today per the request of Harriette Bouillon, PA for evaluation of left sided lower back pain radiating to leg and foot. Patient also reports left sided thoracic pain that radiates around to the rib cage. Patient reports pain has been ongoing for 10 plus years. Patient states most severe pain is localized to left lower lumbar region and is exacerbated by walking and prolonged standing. She describes pain as a sore and shooting sensation and intermittently has numbness and tingling to left foot. Patient reports some relief of pain with home exercise regimen, rest and use of OTC medications as needed. Patient was recently referred to formal physical therapy by Harriette Bouillon, PA with out in-house team, her first session is on 05/25/2021. Patient's recent lumbar MRI exhibits multi-level facet arthropathy, most advanced at L4-L5 and L5-S1, slightly worse on the left. There is also mild to moderate spinal canal stenosis at L4-L5. Patient's recent thoracic MRI exhibits mild degenerative changes, trace anterolisthesis of T2 on T3, T3 on T4 and T4 on T5. No high grade spinal canal stenosis noted. 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 Patient denies focal weakness, numbness and tingling. Patient denies recent trauma or falls.      Review of Systems  Musculoskeletal:  Positive for back pain.  Neurological:  Negative for tingling, sensory change, focal weakness and weakness.  All other systems reviewed and are negative.  Otherwise per HPI.  Assessment & Plan: Visit Diagnoses:    ICD-10-CM   1. Lumbar radiculopathy  M54.16     2. Spinal stenosis of lumbar region with neurogenic claudication  M48.062     3. Other spondylosis with radiculopathy, lumbar region  M47.26     4. Facet hypertrophy of lumbar region  M47.816     5. Chronic left-sided thoracic back pain  M54.6    G89.29     6. Myofascial pain syndrome  M79.18        Plan: Findings:  Chronic, worsening and severe left-sided lower back pain radiating down to leg and foot.  Patient continues to have excruciating pain despite good conservative therapy such as home exercise program rest and use of medications.  Patient's clinical presentation and exam is consistent with neurogenic claudication and radiculopathy as a result of spinal canal stenosis as well as likely facet mediated back pain.  We believe the next step is to perform a diagnostic and hopefully therapeutic left L4-L5 interlaminar epidural steroid injection under fluoroscopic guidance.  Patient is not currently undergoing long-term anticoagulation therapy.  Patient's recent lumbar MRI does exhibit mild to moderate spinal canal stenosis at L4-L5.  We encourage patient to remain active and to take frequent breaks at work if possible.  We will follow-up with her after injection to reassess pain.  Chronic, worsening and severe left-sided thoracic back pain radiating around to rib cage.  Patient's clinical presentation and exam are consistent with myofascial pain and potential facet pain. Patient is scheduled  to begin formal physical therapy with our in-house team next week, we feel this treatment is appropriate and we will follow-up with patient to reassess her pain after several sessions of physical therapy.  We did speak with patient about PT modalities such as manual treatments and dry needling which we feel could be beneficial in her case.  No red flag symptoms noted upon exam today.  Patient is  scheduled to begin formal physical therapy next week and continues with physician directed home exercise program. Current medication management is not beneficial in increasing their functional status. Please note that procedures are done as part of a comprehensive orthopedic and pain management program with access to in-house orthopedics, spine surgery and physical therapy as well as access to [facility] biopsychosocial counseling if needed.     Meds & Orders: No orders of the defined types were placed in this encounter.  No orders of the defined types were placed in this encounter.   Follow-up: Return for Left L4-L5 interlaminar epoidural steroid injection.   Procedures: No procedures performed      Clinical History: No specialty comments available.   She reports that she has never smoked. She has never used smokeless tobacco.  Recent Labs    12/26/20 1600  HGBA1C 5.3    Objective:  VS:  HT:    WT:   BMI:     BP: (!) 149/75  HR: (!) 116bpm  TEMP: ( )  RESP:  Physical Exam Vitals and nursing note reviewed.  HENT:     Head: Normocephalic and atraumatic.     Right Ear: External ear normal.     Left Ear: External ear normal.     Nose: Nose normal.     Mouth/Throat:     Mouth: Mucous membranes are moist.  Eyes:     Extraocular Movements: Extraocular movements intact.  Cardiovascular:     Rate and Rhythm: Normal rate.     Pulses: Normal pulses.  Pulmonary:     Effort: Pulmonary effort is normal.  Abdominal:     General: Abdomen is flat. There is no distension.  Musculoskeletal:        General: Tenderness present.     Cervical back: Normal range of motion.     Comments: Pt rises from seated position to standing without difficulty. Good lumbar range of motion. Strong distal strength without clonus, no pain upon palpation of greater trochanters. Sensation intact bilaterally. Walks independently, gait steady.   Skin:    General: Skin is warm and dry.      Capillary Refill: Capillary refill takes less than 2 seconds.  Neurological:     General: No focal deficit present.     Mental Status: She is alert and oriented to person, place, and time.  Psychiatric:        Mood and Affect: Mood normal.    Ortho Exam  Imaging: No results found.  Past Medical/Family/Surgical/Social History: Medications & Allergies reviewed per EMR, new medications updated. Patient Active Problem List   Diagnosis Date Noted   HLD (hyperlipidemia)    Past Medical History:  Diagnosis Date   Arthritis    HLD (hyperlipidemia)    Osteoporosis    Family History  Problem Relation Age of Onset   Diabetes Mother    Heart attack Brother    Past Surgical History:  Procedure Laterality Date   ABDOMINAL HYSTERECTOMY     due to bleeding TAH/BSO   BREAST CYST EXCISION Left  Social History   Occupational History   Not on file  Tobacco Use   Smoking status: Never   Smokeless tobacco: Never  Vaping Use   Vaping Use: Never used  Substance and Sexual Activity   Alcohol use: No   Drug use: No   Sexual activity: Not Currently    Birth control/protection: Surgical

## 2021-05-20 NOTE — Progress Notes (Signed)
Pt state lower back pain that travels down her left leg and foot. Pt state she feels tingling in her left foot. Pt state pain that shoots up her lower back  and around to her left ribs. Pt state any movement makes the pain worse. Pt state she takes pain meds and ice to help ease her pain.  Numeric Pain Rating Scale and Functional Assessment Average Pain 10 Pain Right Now 7 My pain is constant, sharp, stabbing, tingling, and aching Pain is worse with: sitting, standing, and some activites Pain improves with: medication   In the last MONTH (on 0-10 scale) has pain interfered with the following?  1. General activity like being  able to carry out your everyday physical activities such as walking, climbing stairs, carrying groceries, or moving a chair?  Rating(7)  2. Relation with others like being able to carry out your usual social activities and roles such as  activities at home, at work and in your community. Rating(8)  3. Enjoyment of life such that you have  been bothered by emotional problems such as feeling anxious, depressed or irritable?  Rating(9)

## 2021-05-25 ENCOUNTER — Encounter: Payer: Self-pay | Admitting: Physical Therapy

## 2021-05-25 ENCOUNTER — Ambulatory Visit: Payer: Medicare HMO | Admitting: Physical Therapy

## 2021-05-25 ENCOUNTER — Other Ambulatory Visit: Payer: Self-pay

## 2021-05-25 DIAGNOSIS — G8929 Other chronic pain: Secondary | ICD-10-CM | POA: Diagnosis not present

## 2021-05-25 DIAGNOSIS — R262 Difficulty in walking, not elsewhere classified: Secondary | ICD-10-CM

## 2021-05-25 DIAGNOSIS — M6281 Muscle weakness (generalized): Secondary | ICD-10-CM

## 2021-05-25 DIAGNOSIS — M546 Pain in thoracic spine: Secondary | ICD-10-CM | POA: Diagnosis not present

## 2021-05-25 DIAGNOSIS — M5442 Lumbago with sciatica, left side: Secondary | ICD-10-CM

## 2021-05-25 NOTE — Patient Instructions (Signed)
Access Code: [email or web address] URL: [email or web address] Date: 05/25/2021 Prepared by: Ivery Quale  Exercises Seated Lumbar Flexion Stretch - 2 x daily - 6 x weekly - 1 sets - 10 reps - 10 hold Seated Sciatic Tensioner - 2 x daily - 6 x weekly - 1 sets - 10 reps - 3 hold Hooklying Single Knee to Chest Stretch - 2 x daily - 6 x weekly - 1 sets - 2 reps - 30 hold Dead Bug - 2 x daily - 6 x weekly - 1-2 sets - 10 reps Supine Bridge - 2 x daily - 6 x weekly - 1-2 sets - 10 reps - 5 hold Sit to Stand Without Arm Support - 2 x daily - 6 x weekly - 1-2 sets - 10 reps

## 2021-05-25 NOTE — Therapy (Signed)
[facility] Therapy [address] [place], [place], [address] Phone: [phone]   Fax:  [phone]  Physical Therapy Evaluation  Patient Details  Name: Taylor Singleton MRN: [id number] Date of Birth: October 05, 1955 Referring Provider (PT): Julieanne Cotton, [place]   Encounter Date: 05/25/2021   PT End of Session - 05/25/21 1500     Visit Number 1    Number of Visits 4    Date for PT Re-Evaluation 07/06/21    Authorization Type MCR    PT Start Time 1345    PT Stop Time 1435    PT Time Calculation (min) 50 min    Activity Tolerance Patient tolerated treatment well    Behavior During Therapy WFL for tasks assessed/performed             Past Medical History:  Diagnosis Date   Arthritis    HLD (hyperlipidemia)    Osteoporosis     Past Surgical History:  Procedure Laterality Date   ABDOMINAL HYSTERECTOMY     due to bleeding TAH/BSO   BREAST CYST EXCISION Left     There were no vitals filed for this visit.    Subjective Assessment - 05/25/21 1349     Subjective She relays chronic left sided back pain with intermittent radiculopathy down left leg for several years.    Diagnostic tests Patient's recent lumbar MRI exhibits multi-level facet arthropathy, most advanced at L4-L5 and L5-S1, slightly worse on the left. There is also mild to moderate spinal canal stenosis at L4-L5. Patient's recent thoracic MRI exhibits mild degenerative changes, trace anterolisthesis of T2 on T3, T3 on T4 and T4 on T5. No high grade spinal canal stenosis noted    Patient Stated Goals reduce pain    Currently in Pain? Yes    Pain Score 4     Pain Location Back    Pain Orientation Left    Pain Descriptors / Indicators Aching;Shooting    Pain Type Chronic pain    Pain Radiating Towards down left leg    Pain Onset More than a month ago    Pain Frequency Constant    Aggravating Factors  standing activity, getting up from chair, vaccum or mopping    Pain Relieving  Factors resting after standing a while, sometimes ice, has not tried heat                [facility] PT Assessment - 05/25/21 0001       Assessment   Medical Diagnosis Chronic lumbar-thoracic pain    Referring Provider (PT) Magnant, Joycie Peek, PA-C    Onset Date/Surgical Date --   over 2 years   Prior Therapy no recent PT, does see chiropractor      Prior Function   Level of Independence Independent    Vocation Part time employment    Art therapist    Leisure walk      Cognition   Overall Cognitive Status Within Functional Limits for tasks assessed      Observation/Other Assessments   Focus on Therapeutic Outcomes (FOTO)  54% functional, goal 60%      Posture/Postural Control   Posture Comments slumped posture, increased thoracic kyphosis      ROM / Strength   AROM / PROM / Strength AROM;Strength      AROM   AROM Assessment Site Lumbar    Lumbar Flexion 75%    Lumbar Extension 50%    Lumbar - Right Side Bend 75%    Lumbar -  Left Side Bend 50%    Lumbar - Right Rotation 75%    Lumbar - Left Rotation 75%      Strength   Overall Strength Comments bilat leg strength grossly 4+      Palpation   Palpation comment TTP in left glutes, paraspinals      Special Tests   Other special tests mildly + Slump test, negative SLR test, some relief with long axis distraction      Transfers   Transfers Independent with all Transfers                        Objective measurements completed on examination: See above findings.       [facility] Adult PT Treatment/Exercise - 05/25/21 0001       Modalities   Modalities Electrical Stimulation;Moist Heat      Moist Heat Therapy   Number Minutes Moist Heat 15 Minutes    Moist Heat Location Lumbar Spine      Electrical Stimulation   Electrical Stimulation Location Lt lumbar-thoracic    Electrical Stimulation Action IFC X15 min    Electrical Stimulation Parameters to tolerance    Electrical Stimulation  Goals Pain                     PT Education - 05/25/21 1500     Education Details HEP,POC,TENS    Person(s) Educated Patient    Methods Explanation;Demonstration;Verbal cues;Handout    Comprehension Verbalized understanding;Need further instruction              PT Short Term Goals - 05/25/21 1505       PT SHORT TERM GOAL #1   Title Pt will be I and compliant with HEP    Time 4    Period Weeks    Status New    Target Date 06/22/21      PT SHORT TERM GOAL #2   Title Pt will reduce pain by overall 25%    Time 4    Period Weeks    Status New               PT Long Term Goals - 05/25/21 1507       PT LONG TERM GOAL #1   Title She will reduce pain by overall 50%    Time 6    Period Weeks    Status New    Target Date 07/06/21      PT LONG TERM GOAL #2   Title Pt will improve FOTO to >50% funcitonal    Time 6    Period Weeks    Status New    Target Date 07/06/21      PT LONG TERM GOAL #3   Title Pt will improve bilat LE strength to 5/5 MMT tested in sitting to show improved functional strength.    Time 6    Period Weeks    Status New      PT LONG TERM GOAL #4   Title She will improve lumbar ROM to [facility].    Time 6    Period Weeks    Status New                    Plan - 05/25/21 1500     Clinical Impression Statement Pt presents with chronic left sided Lumbar-thoracic pain, neuroforaminal stenosis, facet arthropathy. occasional left leg radicular pain down to her left foot. She will benefit from skilled  PT to address her functional deficits listed below and reduce overall pain.    Personal Factors and Comorbidities Comorbidity 2    Comorbidities PMH: OA,osteoporosis    Examination-Activity Limitations Bend;Carry;Lift;Stand;Squat;Sleep;Locomotion Level    Examination-Participation Restrictions Cleaning;Community Activity;Driving;Laundry;Volunteer;Occupation;Shop    Stability/Clinical Decision Making Evolving/Moderate complexity     Clinical Decision Making Moderate    Rehab Potential Good    PT Frequency 1x / week    PT Duration 4 weeks   4-6   PT Treatment/Interventions Cryotherapy;Electrical Stimulation;Iontophoresis 4mg /ml Dexamethasone;Moist Heat;Traction;Ultrasound;Therapeutic activities;Therapeutic exercise;Neuromuscular re-education;Manual techniques;Passive range of motion;Dry needling;Joint Manipulations;Spinal Manipulations;Taping    PT Next Visit Plan consider traction, how was HEP? how was TENS?    PT Home Exercise Plan PMH: OA,osteoporosis    Consulted and Agree with Plan of Care Patient             Patient will benefit from skilled therapeutic intervention in order to improve the following deficits and impairments:  Decreased activity tolerance, Decreased endurance, Decreased mobility, Decreased range of motion, Decreased strength, Difficulty walking, Increased muscle spasms, Postural dysfunction, Impaired flexibility, Pain  Visit Diagnosis: Chronic left-sided low back pain with left-sided sciatica  Pain in thoracic spine  Muscle weakness (generalized)  Difficulty in walking, not elsewhere classified     Problem List Patient Active Problem List   Diagnosis Date Noted   HLD (hyperlipidemia)     , PT,DPT 05/25/2021, 3:15 PM  [facility] [address] [place], [place], [place] Phone: [phone]   Fax:  [phone]  Name: Berenise Hunton MRN: Cheyenne Adas Date of Birth: 10/02/1955

## 2021-06-02 ENCOUNTER — Ambulatory Visit: Payer: Medicare HMO | Admitting: Physical Therapy

## 2021-06-02 ENCOUNTER — Other Ambulatory Visit: Payer: Self-pay

## 2021-06-02 DIAGNOSIS — G8929 Other chronic pain: Secondary | ICD-10-CM

## 2021-06-02 DIAGNOSIS — M546 Pain in thoracic spine: Secondary | ICD-10-CM

## 2021-06-02 DIAGNOSIS — R262 Difficulty in walking, not elsewhere classified: Secondary | ICD-10-CM | POA: Diagnosis not present

## 2021-06-02 DIAGNOSIS — M5442 Lumbago with sciatica, left side: Secondary | ICD-10-CM

## 2021-06-02 DIAGNOSIS — M6281 Muscle weakness (generalized): Secondary | ICD-10-CM

## 2021-06-02 NOTE — Therapy (Signed)
[facility] Physical Therapy [address] [place], [place], [address] Phone: [phone]   Fax:  [phone]  Physical Therapy Treatment  Patient Details  Name: Taylor Singleton MRN: [id number] Date of Birth: [birth year] Referring Provider (PT): Julieanne Cotton, [place]   Encounter Date: 06/02/2021   PT End of Session - 06/02/21 1221     Visit Number 2    Number of Visits 4    Date for PT Re-Evaluation 07/06/21    Authorization Type MCR    PT Start Time 1145    PT Stop Time 1230    PT Time Calculation (min) 45 min    Activity Tolerance Patient tolerated treatment well    Behavior During Therapy [facility] for tasks assessed/performed             Past Medical History:  Diagnosis Date   Arthritis    HLD (hyperlipidemia)    Osteoporosis     Past Surgical History:  Procedure Laterality Date   ABDOMINAL HYSTERECTOMY     due to bleeding TAH/BSO   BREAST CYST EXCISION Left     There were no vitals filed for this visit.   Subjective Assessment - 06/02/21 1156     Subjective She relays the pain is overall a little better since she was in here last. She has been doing HEP and walking. She has been using heat more. She feels the pain in her thoracic is aggravated by stress.    Diagnostic tests Patient's recent lumbar MRI exhibits multi-level facet arthropathy, most advanced at L4-L5 and L5-S1, slightly worse on the left. There is also mild to moderate spinal canal stenosis at L4-L5. Patient's recent thoracic MRI exhibits mild degenerative changes, trace anterolisthesis of T2 on T3, T3 on T4 and T4 on T5. No high grade spinal canal stenosis noted    Patient Stated Goals reduce pain    Pain Onset More than a month ago                               [facility] Adult PT Treatment/Exercise - 06/02/21 0001       Exercises   Exercises Lumbar      Lumbar Exercises: Stretches   Double Knee to Chest Stretch Limitations 10 reps holding 5 sec with feet  on pball    Other Lumbar Stretch Exercise seated lumbar flexion pball roll outs 10 X 5 sec    Other Lumbar Stretch Exercise SL thoracic rotation stretch 10 X bilat      Lumbar Exercises: Aerobic   Nustep L5 X 10 min UE/LE with heat      Lumbar Exercises: Supine   Bridge 10 reps;5 seconds      Modalities   Modalities Traction      Traction   Type of Traction Lumbar    Min (lbs) 45    Max (lbs) 60    Rest Time intermitt pre set program    Time 15 min                       PT Short Term Goals - 05/25/21 1505       PT SHORT TERM GOAL #1   Title Pt will be I and compliant with HEP    Time 4    Period Weeks    Status New    Target Date 06/22/21      PT SHORT TERM GOAL #2   Title  Pt will reduce pain by overall 25%    Time 4    Period Weeks    Status New               PT Long Term Goals - 05/25/21 1507       PT LONG TERM GOAL #1   Title She will reduce pain by overall 50%    Time 6    Period Weeks    Status New    Target Date 07/06/21      PT LONG TERM GOAL #2   Title Pt will improve FOTO to >50% funcitonal    Time 6    Period Weeks    Status New    Target Date 07/06/21      PT LONG TERM GOAL #3   Title Pt will improve bilat LE strength to 5/5 MMT tested in sitting to show improved functional strength.    Time 6    Period Weeks    Status New      PT LONG TERM GOAL #4   Title She will improve lumbar ROM to [facility].    Time 6    Period Weeks    Status New                   Plan - 06/02/21 1222     Clinical Impression Statement Pain was more in thoracic today and there is maybe some early relief in lumbar pain overall as she as been doing her HEP. She did get pain with dead bug exercise laying down so we will have her try this in standing. I added thoracic rotation stretch to see if this will help and we trialed mechanical traction today for lumbar decompression and we will assess next time if this provided her with any additional  pain relief    Personal Factors and Comorbidities Comorbidity 2    Comorbidities PMH: OA,osteoporosis    Examination-Activity Limitations Bend;Carry;Lift;Stand;Squat;Sleep;Locomotion Level    Examination-Participation Restrictions Cleaning;Community Activity;Driving;Laundry;Volunteer;Occupation;Shop    Stability/Clinical Decision Making Evolving/Moderate complexity    Rehab Potential Good    PT Frequency 1x / week    PT Duration 4 weeks   4-6   PT Treatment/Interventions Cryotherapy;Electrical Stimulation;Iontophoresis 4mg /ml Dexamethasone;Moist Heat;Traction;Ultrasound;Therapeutic activities;Therapeutic exercise;Neuromuscular re-education;Manual techniques;Passive range of motion;Dry needling;Joint Manipulations;Spinal Manipulations;Taping    PT Next Visit Plan how was traction.    PT Home Exercise Plan PMH: OA,osteoporosis    Consulted and Agree with Plan of Care Patient             Patient will benefit from skilled therapeutic intervention in order to improve the following deficits and impairments:  Decreased activity tolerance, Decreased endurance, Decreased mobility, Decreased range of motion, Decreased strength, Difficulty walking, Increased muscle spasms, Postural dysfunction, Impaired flexibility, Pain  Visit Diagnosis: Chronic left-sided low back pain with left-sided sciatica  Pain in thoracic spine  Muscle weakness (generalized)  Difficulty in walking, not elsewhere classified     Problem List Patient Active Problem List   Diagnosis Date Noted   HLD (hyperlipidemia)     , PT,DPT 06/02/2021, 12:25 PM  [facility] Physical Therapy [address] [place], [place], [place] Phone: [phone]   Fax:  [phone]  Name: Taylor Singleton MRN: Cheyenne Adas Date of Birth: [birth year]

## 2021-06-11 ENCOUNTER — Other Ambulatory Visit: Payer: Self-pay | Admitting: Family Medicine

## 2021-06-16 ENCOUNTER — Other Ambulatory Visit: Payer: Self-pay

## 2021-06-16 ENCOUNTER — Encounter: Payer: Self-pay | Admitting: Physical Therapy

## 2021-06-16 ENCOUNTER — Ambulatory Visit: Payer: Medicare HMO | Admitting: Physical Therapy

## 2021-06-16 DIAGNOSIS — M546 Pain in thoracic spine: Secondary | ICD-10-CM

## 2021-06-16 DIAGNOSIS — M5442 Lumbago with sciatica, left side: Secondary | ICD-10-CM | POA: Diagnosis not present

## 2021-06-16 DIAGNOSIS — M6281 Muscle weakness (generalized): Secondary | ICD-10-CM

## 2021-06-16 DIAGNOSIS — G8929 Other chronic pain: Secondary | ICD-10-CM | POA: Diagnosis not present

## 2021-06-16 DIAGNOSIS — R262 Difficulty in walking, not elsewhere classified: Secondary | ICD-10-CM

## 2021-06-16 NOTE — Therapy (Signed)
[facility] Physical Therapy [address] [place], [place], [address] Phone: [phone]   Fax:  [phone]  Physical Therapy Treatment  Patient Details  Name: Taylor Singleton MRN: [id number] Date of Birth: 1956/03/31 Referring Provider (PT): Taylor Singleton, [place]   Encounter Date: 06/16/2021   PT End of Session - 06/16/21 1101     Visit Number 3    Number of Visits 4    Date for PT Re-Evaluation 07/06/21    Authorization Type MCR    PT Start Time 1010    PT Stop Time 1100    PT Time Calculation (min) 50 min    Activity Tolerance Patient tolerated treatment well    Behavior During Therapy [facility] for tasks assessed/performed             Past Medical History:  Diagnosis Date   Arthritis    HLD (hyperlipidemia)    Osteoporosis     Past Surgical History:  Procedure Laterality Date   ABDOMINAL HYSTERECTOMY     due to bleeding TAH/BSO   BREAST CYST EXCISION Left     There were no vitals filed for this visit.   Subjective Assessment - 06/16/21 1033     Subjective she relays her thoracic pain is about the same and still feels it is somewhat stress related. She does feel the radicular pain down her leg has improved some but she still gets it and times.    Diagnostic tests Patient's recent lumbar MRI exhibits multi-level facet arthropathy, most advanced at L4-L5 and L5-S1, slightly worse on the left. There is also mild to moderate spinal canal stenosis at L4-L5. Patient's recent thoracic MRI exhibits mild degenerative changes, trace anterolisthesis of T2 on T3, T3 on T4 and T4 on T5. No high grade spinal canal stenosis noted    Patient Stated Goals reduce pain    Pain Onset More than a month ago                               [facility] Adult PT Treatment/Exercise - 06/16/21 0001       Lumbar Exercises: Stretches   Single Knee to Chest Stretch Right;Left;2 reps;30 seconds    Lower Trunk Rotation 5 reps;10 seconds    Piriformis Stretch  Right;Left;2 reps;30 seconds    Other Lumbar Stretch Exercise SL thoracic rotation stretch 10 X bilat      Lumbar Exercises: Aerobic   Nustep L6 X8 min UE/LE with heat      Lumbar Exercises: Supine   Bridge 15 reps      Lumbar Exercises: Sidelying   Other Sidelying Lumbar Exercises SL thoracic rotation 5 sec X10 bilat    Other Sidelying Lumbar Exercises SL hip abd with bent knee X10 bilat      Manual Therapy   Manual therapy comments skilled palpation and active compression with DN              Trigger Point Dry Needling - 06/16/21 0001     Consent Given? Yes    Education Handout Provided Yes    Muscles Treated Back/Hip Thoracic multifidi;Quadratus lumborum    Dry Needling Comments good overall tolerance to this    Electrical Stimulation Performed with Dry Needling Yes    E-stim with Dry Needling Details micro current at 100 frequency and intensity to her tolerance.  PT Short Term Goals - 05/25/21 1505       PT SHORT TERM GOAL #1   Title Pt will be I and compliant with HEP    Time 4    Period Weeks    Status New    Target Date 06/22/21      PT SHORT TERM GOAL #2   Title Pt will reduce pain by overall 25%    Time 4    Period Weeks    Status New               PT Long Term Goals - 05/25/21 1507       PT LONG TERM GOAL #1   Title She will reduce pain by overall 50%    Time 6    Period Weeks    Status New    Target Date 07/06/21      PT LONG TERM GOAL #2   Title Pt will improve FOTO to >50% funcitonal    Time 6    Period Weeks    Status New    Target Date 07/06/21      PT LONG TERM GOAL #3   Title Pt will improve bilat LE strength to 5/5 MMT tested in sitting to show improved functional strength.    Time 6    Period Weeks    Status New      PT LONG TERM GOAL #4   Title She will improve lumbar ROM to [facility].    Time 6    Period Weeks    Status New                   Plan - 06/16/21 1102     Clinical  Impression Statement She did not like mechanical traction from last time so we held off on this and instead trialed DN today combined with E stim to treat her myofascial pain in Lt lumbar-thoracic. She had good overall tolerance to this and we will assess her longer term response to this next visit.    Personal Factors and Comorbidities Comorbidity 2    Comorbidities PMH: OA,osteoporosis    Examination-Activity Limitations Bend;Carry;Lift;Stand;Squat;Sleep;Locomotion Level    Examination-Participation Restrictions Cleaning;Community Activity;Driving;Laundry;Volunteer;Occupation;Shop    Stability/Clinical Decision Making Evolving/Moderate complexity    Rehab Potential Good    PT Frequency 1x / week    PT Duration 4 weeks   4-6   PT Treatment/Interventions Cryotherapy;Electrical Stimulation;Iontophoresis 4mg /ml Dexamethasone;Moist Heat;Traction;Ultrasound;Therapeutic activities;Therapeutic exercise;Neuromuscular re-education;Manual techniques;Passive range of motion;Dry needling;Joint Manipulations;Spinal Manipulations;Taping    PT Next Visit Plan how was DN?    PT Home Exercise Plan PMH: OA,osteoporosis    Consulted and Agree with Plan of Care Patient             Patient will benefit from skilled therapeutic intervention in order to improve the following deficits and impairments:  Decreased activity tolerance, Decreased endurance, Decreased mobility, Decreased range of motion, Decreased strength, Difficulty walking, Increased muscle spasms, Postural dysfunction, Impaired flexibility, Pain  Visit Diagnosis: Chronic left-sided low back pain with left-sided sciatica  Pain in thoracic spine  Muscle weakness (generalized)  Difficulty in walking, not elsewhere classified     Problem List Patient Active Problem List   Diagnosis Date Noted   HLD (hyperlipidemia)     Taylor Singleton, PT,DPT 06/16/2021, 11:03 AM  [facility] Physical Therapy [address] [place],  [place], [address] Phone: [phone]   Fax:  [phone]  Name: Taylor Singleton MRN: [id number] Date of Birth: 1956-06-09

## 2021-06-23 ENCOUNTER — Other Ambulatory Visit: Payer: Self-pay

## 2021-06-23 ENCOUNTER — Ambulatory Visit: Payer: Medicare HMO | Admitting: Physical Therapy

## 2021-06-23 DIAGNOSIS — G8929 Other chronic pain: Secondary | ICD-10-CM

## 2021-06-23 DIAGNOSIS — M6281 Muscle weakness (generalized): Secondary | ICD-10-CM

## 2021-06-23 DIAGNOSIS — R262 Difficulty in walking, not elsewhere classified: Secondary | ICD-10-CM | POA: Diagnosis not present

## 2021-06-23 DIAGNOSIS — M5442 Lumbago with sciatica, left side: Secondary | ICD-10-CM

## 2021-06-23 DIAGNOSIS — M546 Pain in thoracic spine: Secondary | ICD-10-CM | POA: Diagnosis not present

## 2021-06-23 NOTE — Therapy (Signed)
[facility] Physical Therapy [address] [place], [place], [address] Phone: [phone]   Fax:  [phone]  Physical Therapy Treatment  Patient Details  Name: Taylor Singleton MRN: [id number] Date of Birth: 02/18/1956 Referring Provider (PT): Julieanne Cotton, [place]   Encounter Date: 06/23/2021   PT End of Session - 06/23/21 1115     Visit Number 4    Number of Visits 4    Date for PT Re-Evaluation 07/06/21    Authorization Type MCR    PT Start Time 1012    PT Stop Time 1102    PT Time Calculation (min) 50 min    Activity Tolerance Patient tolerated treatment well    Behavior During Therapy [facility] for tasks assessed/performed             Past Medical History:  Diagnosis Date   Arthritis    HLD (hyperlipidemia)    Osteoporosis     Past Surgical History:  Procedure Laterality Date   ABDOMINAL HYSTERECTOMY     due to bleeding TAH/BSO   BREAST CYST EXCISION Left     There were no vitals filed for this visit.   Subjective Assessment - 06/23/21 1034     Subjective Stress pain of pt has improved since last visit. DN helped localize pain closer to the spine instead of widespread. Radicular symptoms have improved no numbness in the foot. She still has pain if walking greater than 10 min.    Diagnostic tests Patient's recent lumbar MRI exhibits multi-level facet arthropathy, most advanced at L4-L5 and L5-S1, slightly worse on the left. There is also mild to moderate spinal canal stenosis at L4-L5. Patient's recent thoracic MRI exhibits mild degenerative changes, trace anterolisthesis of T2 on T3, T3 on T4 and T4 on T5. No high grade spinal canal stenosis noted    Patient Stated Goals reduce pain    Pain Onset More than a month ago                               [facility] Adult PT Treatment/Exercise - 06/23/21 0001       Lumbar Exercises: Stretches   Other Lumbar Stretch Exercise standing thoraciclumbar L stretch holding on at counter  top 5sec X10      Lumbar Exercises: Standing   Row 10 reps    Theraband Level (Row) Level 2 (Red)    Row Limitations unilateral for more thoracic rotation, performed on both sides      Lumbar Exercises: Sidelying   Other Sidelying Lumbar Exercises SL thoracic rotation 10 sec X5 bilat      Lumbar Exercises: Prone   Other Prone Lumbar Exercises thoracic prone extension 2 sec X10      Manual Therapy   Manual therapy comments skilled palpation and active compression with DN           Bridges 5 sec X10   Trigger Point Dry Needling - 06/23/21 0001     Consent Given? Yes    Education Handout Provided Yes    Muscles Treated Back/Hip Thoracic multifidi;Erector spinae    Dry Needling Comments good overall tolerance to this    Electrical Stimulation Performed with Dry Needling Yes    E-stim with Dry Needling Details micro current at 100 frequency and intensity to her tolerance.                     PT Short Term Goals - 06/23/21  1117       PT SHORT TERM GOAL #1   Title Pt will be I and compliant with HEP    Time 4    Period Weeks    Status Achieved    Target Date 06/22/21      PT SHORT TERM GOAL #2   Title Pt will reduce pain by overall 25%    Time 4    Period Weeks    Status Achieved               PT Long Term Goals - 06/23/21 1117       PT LONG TERM GOAL #1   Title She will reduce pain by overall 50%    Time 6    Period Weeks    Status On-going    Target Date 07/06/21      PT LONG TERM GOAL #2   Title Pt will improve FOTO to >50% funcitonal    Time 6    Period Weeks    Status On-going    Target Date 07/06/21      PT LONG TERM GOAL #3   Title Pt will improve bilat LE strength to 5/5 MMT tested in sitting to show improved functional strength.    Time 6    Period Weeks    Status On-going      PT LONG TERM GOAL #4   Title She will improve lumbar ROM to [facility].    Time 6    Period Weeks    Status On-going                   Plan  - 06/23/21 1043     Clinical Impression Statement We continued DN as she feels this was helpful. Radicular pain has made progress overall. We did update her HEP today and reviewed this with her with good understanding. We will reassess her next visit for the need to continue with PT.    Personal Factors and Comorbidities Comorbidity 2    Comorbidities PMH: OA,osteoporosis    Examination-Activity Limitations Bend;Carry;Lift;Stand;Squat;Sleep;Locomotion Level    Examination-Participation Restrictions Cleaning;Community Activity;Driving;Laundry;Volunteer;Occupation;Shop    Stability/Clinical Decision Making Evolving/Moderate complexity    Rehab Potential Good    PT Frequency 1x / week    PT Duration 4 weeks   4-6   PT Treatment/Interventions Cryotherapy;Electrical Stimulation;Iontophoresis 4mg /ml Dexamethasone;Moist Heat;Traction;Ultrasound;Therapeutic activities;Therapeutic exercise;Neuromuscular re-education;Manual techniques;Passive range of motion;Dry needling;Joint Manipulations;Spinal Manipulations;Taping    PT Next Visit Plan reassessment    PT Home Exercise Plan [email or web address]    Consulted and Agree with Plan of Care Patient             Patient will benefit from skilled therapeutic intervention in order to improve the following deficits and impairments:  Decreased activity tolerance, Decreased endurance, Decreased mobility, Decreased range of motion, Decreased strength, Difficulty walking, Increased muscle spasms, Postural dysfunction, Impaired flexibility, Pain  Visit Diagnosis: Chronic left-sided low back pain with left-sided sciatica  Pain in thoracic spine  Muscle weakness (generalized)  Difficulty in walking, not elsewhere classified     Problem List Patient Active Problem List   Diagnosis Date Noted   HLD (hyperlipidemia)     , PT,DPT 06/23/2021, 11:17 AM  [facility] Physical Therapy [address] [place], [place], [place] Phone:  [phone]   Fax:  [phone]  Name: Shannel Zahm MRN: Cheyenne Adas Date of Birth: 08/26/1955

## 2021-06-23 NOTE — Patient Instructions (Signed)
Access Code: [email or web address] URL: [email or web address] Date: 06/23/2021 Prepared by: Ivery Quale  Exercises Cobra with Emphasis on Upward Gaze - 2 x daily - 6 x weekly - 1 sets - 10 reps - 3 sec hold Supine Bridge - 2 x daily - 6 x weekly - 1-2 sets - 10 reps - 5 hold Dead Bug - 2 x daily - 6 x weekly - 1-2 sets - 10 reps Sidelying Thoracic Lumbar Rotation - 2 x daily - 6 x weekly - 5 reps - 1 sets - 10 hold Sit to Stand Without Arm Support - 2 x daily - 6 x weekly - 1-2 sets - 10 reps Standing Single Arm Row with Resistance Thumb Up - 2 x daily - 6 x weekly - 2 sets - 10-15 reps Standing 'L' Stretch at Counter - 2 x daily - 6 x weekly - 10 reps - 1 sets - 5 hold

## 2021-06-30 ENCOUNTER — Encounter: Payer: Medicare HMO | Admitting: Physical Therapy

## 2021-07-10 ENCOUNTER — Other Ambulatory Visit: Payer: Self-pay

## 2021-07-10 ENCOUNTER — Ambulatory Visit: Payer: Medicare HMO | Admitting: Physical Therapy

## 2021-07-10 DIAGNOSIS — M5442 Lumbago with sciatica, left side: Secondary | ICD-10-CM

## 2021-07-10 DIAGNOSIS — G8929 Other chronic pain: Secondary | ICD-10-CM | POA: Diagnosis not present

## 2021-07-10 DIAGNOSIS — M546 Pain in thoracic spine: Secondary | ICD-10-CM

## 2021-07-10 DIAGNOSIS — R262 Difficulty in walking, not elsewhere classified: Secondary | ICD-10-CM | POA: Diagnosis not present

## 2021-07-10 DIAGNOSIS — M6281 Muscle weakness (generalized): Secondary | ICD-10-CM | POA: Diagnosis not present

## 2021-07-10 NOTE — Therapy (Addendum)
[facility] Physical Therapy [address] [place], [place], [address] Phone: [phone]   Fax:  [phone]        Discharge addendum PHYSICAL THERAPY DISCHARGE SUMMARY  Visits from Start of Care: 5  Current functional level related to goals / functional outcomes: See below   Remaining deficits: See below   Education / Equipment: HEP  Plan: Patient agrees to discharge.  Patient goals were all but one met. Patient is being discharged due transition to independent program.       Physical Therapy Treatment/Recert  Patient Details  Name: Taylor Singleton MRN: [id number] Date of Birth: 08/28/1955 Referring Provider (PT): Julieanne Cotton, [place]   Encounter Date: 07/10/2021   PT End of Session - 07/10/21 1111     Visit Number 5    Number of Visits 5    Date for PT Re-Evaluation 09/04/21    Authorization Type MCR    PT Start Time 1015    PT Stop Time 1053    PT Time Calculation (min) 38 min    Activity Tolerance Patient tolerated treatment well    Behavior During Therapy WFL for tasks assessed/performed             Past Medical History:  Diagnosis Date   Arthritis    HLD (hyperlipidemia)    Osteoporosis     Past Surgical History:  Procedure Laterality Date   ABDOMINAL HYSTERECTOMY     due to bleeding TAH/BSO   BREAST CYST EXCISION Left     There were no vitals filed for this visit.   Subjective Assessment - 07/10/21 1108     Subjective relays she is doing much better and likes the exercises. She feels ready to trial independent program and hold PT for now    Diagnostic tests Patient's recent lumbar MRI exhibits multi-level facet arthropathy, most advanced at L4-L5 and L5-S1, slightly worse on the left. There is also mild to moderate spinal canal stenosis at L4-L5. Patient's recent thoracic MRI exhibits mild degenerative changes, trace anterolisthesis of T2 on T3, T3 on T4 and T4 on T5. No high grade spinal canal stenosis noted     Patient Stated Goals reduce pain    Pain Onset More than a month ago                [facility] PT Assessment - 07/10/21 0001       Assessment   Medical Diagnosis Chronic lumbar-thoracic pain    Referring Provider (PT) Magnant, Joycie Peek, PA-C      Observation/Other Assessments   Focus on Therapeutic Outcomes (FOTO)  69% functional and met goal for this      AROM   Overall AROM Comments WFL lumbar ROM now      Strength   Overall Strength Comments bilat leg strength grossly 4+                           [facility] Adult PT Treatment/Exercise - 07/10/21 0001       Exercises   Exercises Other Exercises    Other Exercises  stnading lumbar extensions 3 sec x10, standing hip extensions red X15 bilat, standing lumbar L stretch 10 sec X5, standing unilateral rows red X20 bilat, sidelying thoracic rotations X10 holding 5 sec                       PT Short Term Goals - 06/23/21 1117  PT SHORT TERM GOAL #1   Title Pt will be I and compliant with HEP    Time 4    Period Weeks    Status Achieved    Target Date 06/22/21      PT SHORT TERM GOAL #2   Title Pt will reduce pain by overall 25%    Time 4    Period Weeks    Status Achieved               PT Long Term Goals - 07/10/21 1116       PT LONG TERM GOAL #1   Title She will reduce pain by overall 50%    Time 6    Period Weeks    Status Achieved    Target Date 07/06/21      PT LONG TERM GOAL #2   Title Pt will improve FOTO to >50% funcitonal    Time 6    Period Weeks    Status Achieved    Target Date 07/06/21      PT LONG TERM GOAL #3   Title Pt will improve bilat LE strength to 5/5 MMT tested in sitting to show improved functional strength.    Baseline 4+    Time 6    Period Weeks    Status Partially Met      PT LONG TERM GOAL #4   Title She will improve lumbar ROM to [facility].    Time 6    Period Weeks    Status Achieved                   Plan - 07/10/21 1113      Clinical Impression Statement Recert today as her POC date has expired. Upon assessment and discussion with her she is pleased with her functional level and has much less overall pain. She wants to trial her HEP and indpependent program and will hold PT for now. If we do not hear from her in 60 days we will close out her episode then.    Personal Factors and Comorbidities Comorbidity 2    Comorbidities PMH: OA,osteoporosis    Examination-Activity Limitations Bend;Carry;Lift;Stand;Squat;Sleep;Locomotion Level    Examination-Participation Restrictions Cleaning;Community Activity;Driving;Laundry;Volunteer;Occupation;Shop    Stability/Clinical Decision Making Evolving/Moderate complexity    Rehab Potential Good    PT Frequency 1x / week    PT Duration 4 weeks   4-6   PT Treatment/Interventions Cryotherapy;Electrical Stimulation;Iontophoresis 4mg /ml Dexamethasone;Moist Heat;Traction;Ultrasound;Therapeutic activities;Therapeutic exercise;Neuromuscular re-education;Manual techniques;Passive range of motion;Dry needling;Joint Manipulations;Spinal Manipulations;Taping    PT Next Visit Plan hold PT 60 days    PT Home Exercise Plan [email or web address]    Consulted and Agree with Plan of Care Patient             Patient will benefit from skilled therapeutic intervention in order to improve the following deficits and impairments:  Decreased activity tolerance, Decreased endurance, Decreased mobility, Decreased range of motion, Decreased strength, Difficulty walking, Increased muscle spasms, Postural dysfunction, Impaired flexibility, Pain  Visit Diagnosis: Chronic left-sided low back pain with left-sided sciatica  Pain in thoracic spine  Muscle weakness (generalized)  Difficulty in walking, not elsewhere classified     Problem List Patient Active Problem List   Diagnosis Date Noted   HLD (hyperlipidemia)     April Manson, PT,DPT 07/10/2021, 11:17 AM  [facility] Physical  Therapy [address] [place], [place], [address] Phone: [phone]   Fax:  [phone]  Name: Retia Bowlen MRN: [id number] Date of Birth: [birth year]

## 2021-07-16 ENCOUNTER — Telehealth: Payer: Self-pay | Admitting: Family Medicine

## 2021-07-16 NOTE — Telephone Encounter (Signed)
Patient will soon be due for a 6 month follow up to repeat complete metabolic panel and lipid panel. Requesting to have blood glucose level checked as well.   Patient also requesting refill of atorvastatin (LIPITOR) 20 MG tablet [[id number]] before end of the month.   Pharmacy confirmed as   [id number] [place], [place] - [address]  [address] [place] [address]  Phone:  [phone]  Fax:  [phone]  DEA #:  [id number]  Patient taking 500mg  of tumeric each day; wants to know if it's ok to increase it to 1,000mg  daily.   Please advise at [phone].

## 2021-07-16 NOTE — Telephone Encounter (Signed)
Patient aware she should have refills.

## 2021-07-17 DIAGNOSIS — Z0100 Encounter for examination of eyes and vision without abnormal findings: Secondary | ICD-10-CM | POA: Diagnosis not present

## 2021-07-21 ENCOUNTER — Ambulatory Visit (INDEPENDENT_AMBULATORY_CARE_PROVIDER_SITE_OTHER): Payer: Medicare HMO | Admitting: Family Medicine

## 2021-07-21 ENCOUNTER — Encounter: Payer: Self-pay | Admitting: Family Medicine

## 2021-07-21 ENCOUNTER — Other Ambulatory Visit: Payer: Self-pay

## 2021-07-21 VITALS — BP 122/68 | HR 85 | Temp 97.9°F | Resp 18 | Ht 61.0 in | Wt 165.0 lb

## 2021-07-21 DIAGNOSIS — L821 Other seborrheic keratosis: Secondary | ICD-10-CM

## 2021-07-21 DIAGNOSIS — L820 Inflamed seborrheic keratosis: Secondary | ICD-10-CM | POA: Diagnosis not present

## 2021-07-21 NOTE — Progress Notes (Signed)
Subjective:    Patient ID: Taylor Singleton, female    DOB: 12/26/1955, 66 y.o.   MRN: [id number]   Picture shown above shows 4 lesions on the patient's back.  The picture is rotated 90 degrees to the left.  The patient's head and neck is located on the left side of the pitcher.  Her legs will be to the right side of the patch or.  There are 4 wartlike brown papules each about 1 cm in diameter and a vertical line running down the center of her back.  They each itch.  They appear to be seborrheic keratoses. Past Medical History:  Diagnosis Date   Arthritis    HLD (hyperlipidemia)    Osteoporosis    Past Surgical History:  Procedure Laterality Date   ABDOMINAL HYSTERECTOMY     due to bleeding TAH/BSO   BREAST CYST EXCISION Left    Current Outpatient Medications on File Prior to Visit  Medication Sig Dispense Refill   alendronate (FOSAMAX) 70 MG tablet Take 1 tablet (70 mg total) by mouth every 7 (seven) days. Take with a full glass of water on an empty stomach. 4 tablet 11   atorvastatin (LIPITOR) 20 MG tablet TAKE 1 TABLET BY MOUTH EVERY DAY 90 tablet 3   Calcium Carb-Cholecalciferol (CALCIUM/VITAMIN D) 600-400 MG-UNIT TABS Take 2 tablets by mouth daily.     HORSE CHESTNUT PO Take by mouth.     meclizine (ANTIVERT) 25 MG tablet Take 1 tablet (25 mg total) by mouth 3 (three) times daily as needed for dizziness. 30 tablet 0   meloxicam (MOBIC) 15 MG tablet TAKE 1 TABLET (15 MG TOTAL) BY MOUTH DAILY. 30 tablet 6   ondansetron (ZOFRAN) 4 MG tablet Take 1 tablet (4 mg total) by mouth every 8 (eight) hours as needed for nausea or vomiting. 20 tablet 0   scopolamine (TRANSDERM-SCOP, 1.5 MG,) 1 MG/3DAYS Place 1 patch (1.5 mg total) onto the skin every 3 (three) days. 10 patch 12   No current facility-administered medications on file prior to visit.   No Known Allergies Social History   Socioeconomic History   Marital status: Married    Spouse name: Not on file   Number of children: 2    Years of education: Not on file   Highest education level: Not on file  Occupational History   Not on file  Tobacco Use   Smoking status: Never   Smokeless tobacco: Never  Vaping Use   Vaping Use: Never used  Substance and Sexual Activity   Alcohol use: No   Drug use: No   Sexual activity: Not Currently    Birth control/protection: Surgical  Other Topics Concern   Not on file  Social History Narrative   Not on file   Social Determinants of Health   Financial Resource Strain: Low Risk    Difficulty of Paying Living Expenses: Not hard at all  Food Insecurity: No Food Insecurity   Worried About Programme researcher, broadcasting/film/video in the Last Year: Never true   Ran Out of Food in the Last Year: Never true  Transportation Needs: No Transportation Needs   Lack of Transportation (Medical): No   Lack of Transportation (Non-Medical): No  Physical Activity: Sufficiently Active   Days of Exercise per Week: 7 days   Minutes of Exercise per Session: 30 min  Stress: No Stress Concern Present   Feeling of Stress : Not at all  Social Connections: Moderately Isolated   Frequency of Communication  with Friends and Family: More than three times a week   Frequency of Social Gatherings with Friends and Family: More than three times a week   Attends Religious Services: Never   Database administrator or Organizations: No   Attends Engineer, structural: Never   Marital Status: Married  Catering manager Violence: Not At Risk   Fear of Current or Ex-Partner: No   Emotionally Abused: No   Physically Abused: No   Sexually Abused: No     Review of Systems     Objective:   Physical Exam Constitutional:      Appearance: She is well-developed.  Cardiovascular:     Rate and Rhythm: Normal rate and regular rhythm.     Heart sounds: Normal heart sounds.  Pulmonary:     Effort: Pulmonary effort is normal. No respiratory distress.     Breath sounds: No wheezing or rales.  Musculoskeletal:        Back:  Skin:    Findings: Lesion present.          Assessment & Plan:  Seborrheic keratoses Patient would like them treated.  We discussed excisions versus cryotherapy.  She elects to try cryotherapy.  Each of the lesions was treated for a total of 30 seconds with liquid nitrogen.  Return for a shave biopsy if persistent.

## 2021-08-02 IMAGING — MG DIGITAL SCREENING BILAT W/ CAD
1 series · 1 of 1 positions shown · non-contrast
Comparison: Previous exam(s).

CLINICAL DATA: Screening.

EXAM:
DIGITAL SCREENING BILATERAL MAMMOGRAM WITH CAD

[L CC]
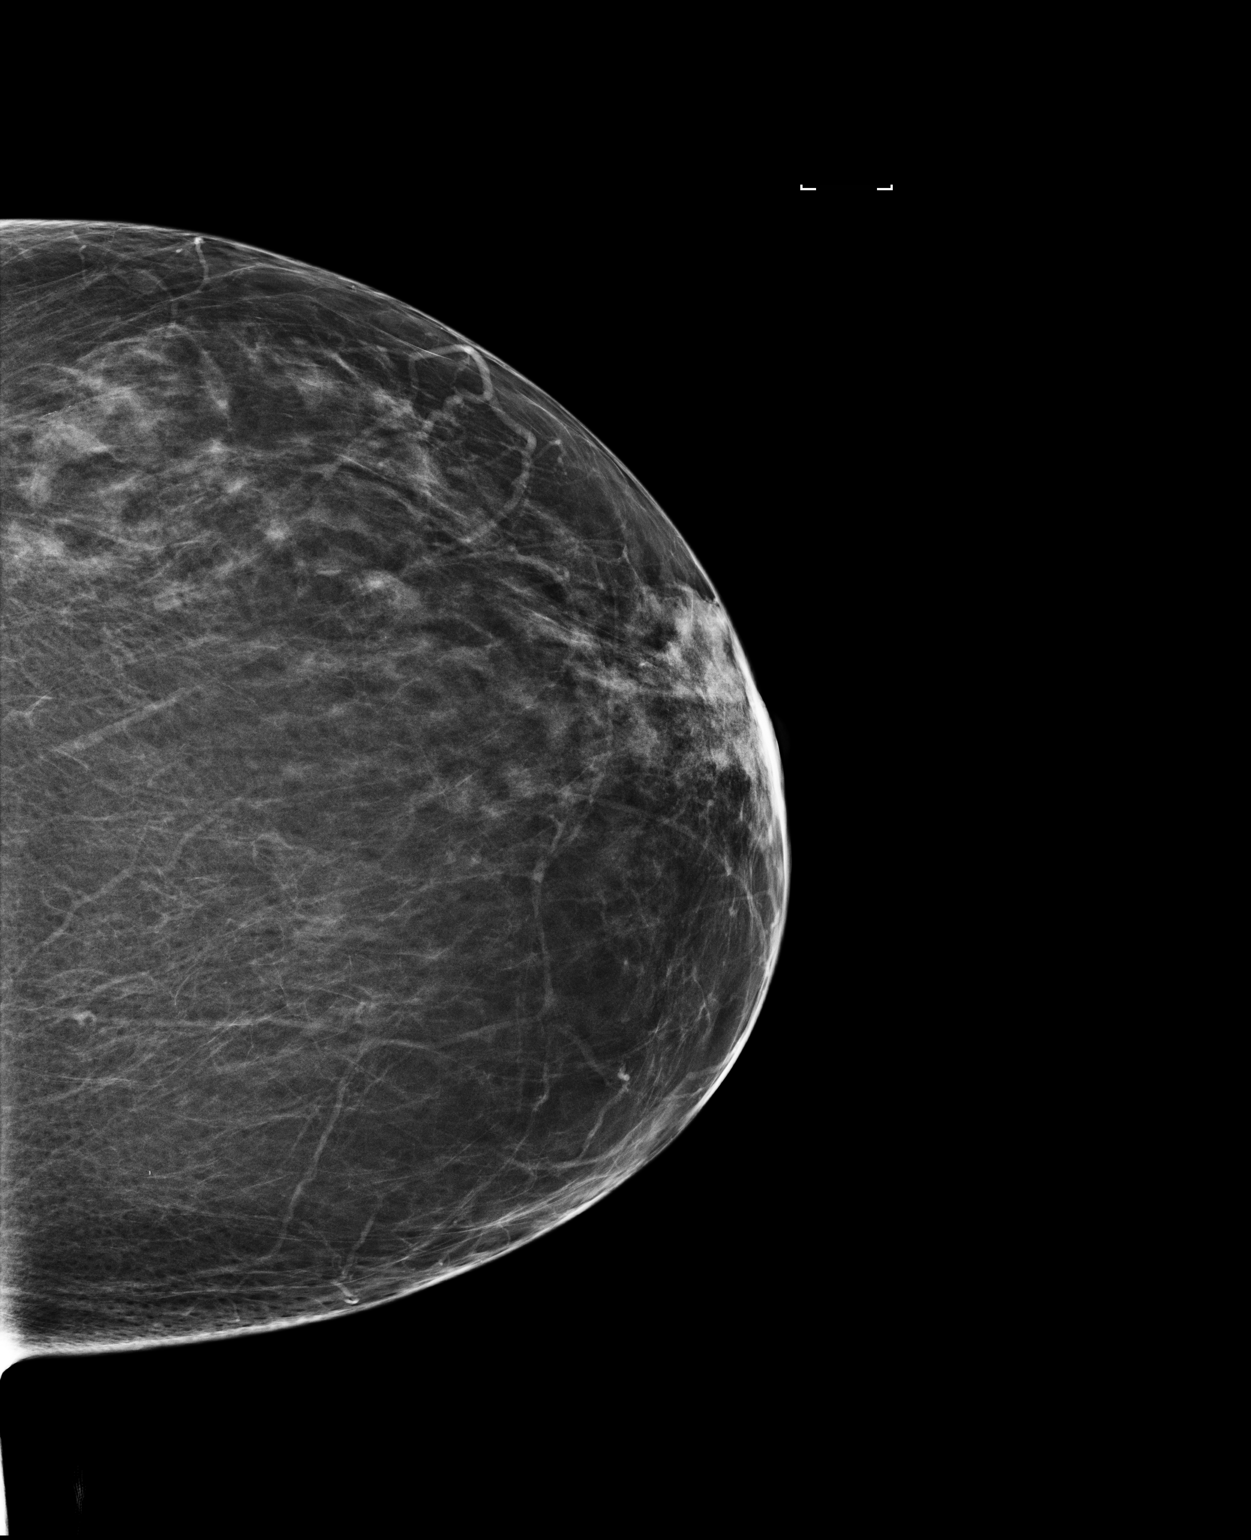

[1 of 1 positions shown; findings below may reference images not displayed]

ACR Breast Density Category b: There are scattered areas of
fibroglandular density.
FINDINGS: There are no findings suspicious for malignancy. Images were
processed with CAD.
IMPRESSION: No mammographic evidence of malignancy. A result letter of this
screening mammogram will be mailed directly to the patient.

RECOMMENDATION:
Screening mammogram in one year. (Code:AS-G-LCT)

BI-RADS CATEGORY  1: Negative.

## 2021-08-03 ENCOUNTER — Other Ambulatory Visit: Payer: Medicare HMO

## 2021-08-03 ENCOUNTER — Other Ambulatory Visit: Payer: Self-pay

## 2021-08-03 DIAGNOSIS — R739 Hyperglycemia, unspecified: Secondary | ICD-10-CM | POA: Diagnosis not present

## 2021-08-04 LAB — HEMOGLOBIN A1C
Hgb A1c MFr Bld: 5.4 % of total Hgb (ref <5.7)
Mean Plasma Glucose: 108 mg/dL
eAG (mmol/L): 6 mmol/L

## 2021-08-07 ENCOUNTER — Other Ambulatory Visit: Payer: Self-pay

## 2021-08-07 ENCOUNTER — Ambulatory Visit (INDEPENDENT_AMBULATORY_CARE_PROVIDER_SITE_OTHER): Payer: Medicare HMO | Admitting: Family Medicine

## 2021-08-07 ENCOUNTER — Encounter: Payer: Self-pay | Admitting: Family Medicine

## 2021-08-07 VITALS — BP 118/62 | HR 77 | Temp 97.0°F | Resp 18 | Ht 61.0 in | Wt 165.0 lb

## 2021-08-07 DIAGNOSIS — E7800 Pure hypercholesterolemia, unspecified: Secondary | ICD-10-CM | POA: Diagnosis not present

## 2021-08-07 MED ORDER — CELECOXIB 200 MG PO CAPS: 200.0000 mg | ORAL_CAPSULE | Freq: Every day | ORAL | 2 refills | Status: DC

## 2021-08-07 NOTE — Progress Notes (Signed)
Subjective:    Patient ID: Taylor Singleton, female    DOB: 03-Dec-1955, 66 y.o.   MRN: [id number]  DEXA in 9/22 showed osteoporosis. Patient is on fosamax.  Mammogram was normal in 7/22. On lab work in August, her cholesterol was slightly elevated.  Her brother died at 40 artifact however he was very unhealthy and smoked.  Her mother also smoked but she also had 4 heart attacks.  We discussed the risk of heart disease with hyperlipidemia.  Patient is currently on atorvastatin.  However she admits that she could do better with her diet Past Medical History:  Diagnosis Date   Arthritis    HLD (hyperlipidemia)    Osteoporosis    Past Surgical History:  Procedure Laterality Date   ABDOMINAL HYSTERECTOMY     due to bleeding TAH/BSO   BREAST CYST EXCISION Left    Current Outpatient Medications on File Prior to Visit  Medication Sig Dispense Refill   alendronate (FOSAMAX) 70 MG tablet Take 1 tablet (70 mg total) by mouth every 7 (seven) days. Take with a full glass of water on an empty stomach. 4 tablet 11   atorvastatin (LIPITOR) 20 MG tablet TAKE 1 TABLET BY MOUTH EVERY DAY 90 tablet 3   Calcium Carb-Cholecalciferol (CALCIUM/VITAMIN D) 600-400 MG-UNIT TABS Take 2 tablets by mouth daily.     TURMERIC PO Take by mouth.     No current facility-administered medications on file prior to visit.   No Known Allergies Social History   Socioeconomic History   Marital status: Married    Spouse name: Not on file   Number of children: 2   Years of education: Not on file   Highest education level: Not on file  Occupational History   Not on file  Tobacco Use   Smoking status: Never   Smokeless tobacco: Never  Vaping Use   Vaping Use: Never used  Substance and Sexual Activity   Alcohol use: No   Drug use: No   Sexual activity: Not Currently    Birth control/protection: Surgical  Other Topics Concern   Not on file  Social History Narrative   Not on file   Social Determinants of Health    Financial Resource Strain: Low Risk    Difficulty of Paying Living Expenses: Not hard at all  Food Insecurity: No Food Insecurity   Worried About Programme researcher, broadcasting/film/video in the Last Year: Never true   Ran Out of Food in the Last Year: Never true  Transportation Needs: No Transportation Needs   Lack of Transportation (Medical): No   Lack of Transportation (Non-Medical): No  Physical Activity: Sufficiently Active   Days of Exercise per Week: 7 days   Minutes of Exercise per Session: 30 min  Stress: No Stress Concern Present   Feeling of Stress : Not at all  Social Connections: Moderately Isolated   Frequency of Communication with Friends and Family: More than three times a week   Frequency of Social Gatherings with Friends and Family: More than three times a week   Attends Religious Services: Never   Database administrator or Organizations: No   Attends Engineer, structural: Never   Marital Status: Married  Catering manager Violence: Not At Risk   Fear of Current or Ex-Partner: No   Emotionally Abused: No   Physically Abused: No   Sexually Abused: No     Review of Systems  All other systems reviewed and are negative.  Objective:   Physical Exam Constitutional:      General: She is not in acute distress.    Appearance: Normal appearance. She is well-developed and normal weight. She is not ill-appearing or toxic-appearing.  HENT:     Right Ear: Tympanic membrane and ear canal normal.     Left Ear: Tympanic membrane and ear canal normal.     Mouth/Throat:     Pharynx: No oropharyngeal exudate or posterior oropharyngeal erythema.  Cardiovascular:     Rate and Rhythm: Normal rate and regular rhythm.     Heart sounds: Normal heart sounds.  Pulmonary:     Effort: Pulmonary effort is normal. No respiratory distress.     Breath sounds: No wheezing or rales.  Musculoskeletal:     Cervical back: Neck supple.  Lymphadenopathy:     Cervical: No cervical adenopathy.   Neurological:     Mental Status: She is alert and oriented to person, place, and time.     Cranial Nerves: No cranial nerve deficit.     Sensory: No sensory deficit.     Motor: No weakness.     Gait: Gait abnormal.  Psychiatric:        Mood and Affect: Mood normal.        Behavior: Behavior normal.        Thought Content: Thought content normal.        Judgment: Judgment normal.          Assessment & Plan:  Pure hypercholesterolemia - Plan: COMPLETE METABOLIC PANEL WITH GFR, Lipid panel Recheck cholesterol in 3 months.  Work on lowering saturated fat in the diet including decreasing consumption of red meat and pork and increasing consumption of fruits and vegetables.  Try to increase aerobic exercise.  Patient has a slightly antalgic gait and reports pain in her knees and joints so we will try her on Celebrex 200 mg a day to see if this will help with inflammation and pain

## 2021-08-12 ENCOUNTER — Telehealth: Payer: Self-pay | Admitting: Physical Medicine and Rehabilitation

## 2021-08-12 NOTE — Telephone Encounter (Signed)
Pt called and said she is still having problems with sciatica and she is done with physical therapy. She wants to come see [place]  ? ?CB [phone]  ?

## 2021-08-21 ENCOUNTER — Other Ambulatory Visit: Payer: Self-pay

## 2021-08-21 ENCOUNTER — Encounter: Payer: Self-pay | Admitting: Physical Medicine and Rehabilitation

## 2021-08-21 ENCOUNTER — Ambulatory Visit: Payer: Medicare HMO | Admitting: Physical Medicine and Rehabilitation

## 2021-08-21 VITALS — BP 131/81 | HR 90

## 2021-08-21 DIAGNOSIS — M48062 Spinal stenosis, lumbar region with neurogenic claudication: Secondary | ICD-10-CM | POA: Diagnosis not present

## 2021-08-21 DIAGNOSIS — M4726 Other spondylosis with radiculopathy, lumbar region: Secondary | ICD-10-CM | POA: Diagnosis not present

## 2021-08-21 DIAGNOSIS — M7918 Myalgia, other site: Secondary | ICD-10-CM | POA: Diagnosis not present

## 2021-08-21 DIAGNOSIS — M5416 Radiculopathy, lumbar region: Secondary | ICD-10-CM | POA: Diagnosis not present

## 2021-08-21 DIAGNOSIS — M47816 Spondylosis without myelopathy or radiculopathy, lumbar region: Secondary | ICD-10-CM | POA: Diagnosis not present

## 2021-08-21 NOTE — Progress Notes (Signed)
? ?Taylor Singleton - 66 y.o. female MRN [id number]  Date of birth: [birth year] ? ?Office Visit Note: ?Visit Date: 08/21/2021 ?PCP: Donita Brooks, MD ?Referred by: Donita Brooks, MD ? ?Subjective: ?Chief Complaint  ?Patient presents with  ? Lower Back - Pain  ? Left Leg - Pain  ? Left Knee - Pain  ? ?HPI: Taylor Singleton is a 66 y.o. female who comes in today for evaluation of chronic, worsening and severe left sided lower back pain radiating to buttock, down to lateral leg and foot. Patient reports pain has been ongoing for several years, states pain is exacerbated by prolonged walking and standing, describes as a tingling and sore sensation, currently rates as 7 out of 10. Patient reports some relief of pain with home exercise regimen, ice/heat and OTC medications. Patient recently completed formal physical therapy with our in house team and reports some relief of pain, especially with diffuse thoracic myofascial pain. Patients lumbar MRI exhibits multi-level facet arthropathy, most advanced at L4-L5 and L5-S1, slightly worse on the left. There is also mild to moderate spinal canal stenosis at L4-L5. Patient states she is a very active person and has been performing PT exercises at home daily, also reports walking and playing gold with her husband. Patient states she continues to work part time at [employer], however has plans to fully retire soon. Patient reports lower back issues continues to limit physical activity and makes it difficult to keep up with others when walking for prolonged periods. Patient states she has family trip planned in April and would like to have lumbar epidural steroid injection before her trip. Patient denies focal weakness, numbness and tingling. Patient denies recent trauma or falls.  ?Pati ?Review of Systems  ?Musculoskeletal:  Positive for back pain and myalgias.  ?Neurological:  Positive for tingling. Negative for sensory change, focal weakness and weakness.  ?All other systems  reviewed and are negative. Otherwise per HPI. ? ?Assessment & Plan: ?Visit Diagnoses:  ?  ICD-10-CM   ?1. Lumbar radiculopathy  M54.16 Ambulatory referral to Physical Medicine Rehab  ?  ?2. Spinal stenosis of lumbar region with neurogenic claudication  M48.062 Ambulatory referral to Physical Medicine Rehab  ?  ?3. Other spondylosis with radiculopathy, lumbar region  M47.26 Ambulatory referral to Physical Medicine Rehab  ?  ?4. Facet hypertrophy of lumbar region  M47.816   ?  ?5. Myofascial pain syndrome  M79.18   ?  ?   ?Plan: Findings:  ?Chronic, worsening and severe left sided lower back pain radiating to buttock, down to lateral leg and foot. Improved diffuse thoracic myofascial pain with recent physical therapy. Patient continues to have debilitating lower back pain pain despite good conservative therapies such as formal physical therapy, home exercise regimen, use of ice/heat and OTC medications. Patients clinical presentation and exam remain consistent with neurogenic claudication as a result of spinal canal stenosis. We feel the next step is to perform a diagnostic and hopefully therapeutic left L4-L5 interlaminar epidural steroid injection under fluoroscopic guidance. Patient is not currently undergoing long term anticoagulant therapy. Patient encouraged to remain active and to continue home exercise regimen. We feel that we can get patient in for epidural injection before she leaves for her trip in April. No red flag symptoms noted upon exam today.  ? ?Patient has completed formal physical therapy and continues with physician directed home exercise program. Current medication management is not beneficial in increasing her functional status. Please note that procedures are done as part  of a comprehensive orthopedic and pain management program with access to in-house orthopedics, spine surgery and physical therapy as well as access to [facility] biopsychosocial counseling if needed.  ? ?Meds &  Orders: No orders of the defined types were placed in this encounter. ?  ?Orders Placed This Encounter  ?Procedures  ? Ambulatory referral to Physical Medicine Rehab  ?  ?Follow-up: Return for Left L4-L5 interlaminar epidural steroid injection.  ? ?Procedures: ?No procedures performed  ?   ? ?Clinical History: ?No specialty comments available.  ? ?She reports that she has never smoked. She has never used smokeless tobacco.  ?Recent Labs  ?  12/26/20 ?1600 08/03/21 ?0832  ?HGBA1C 5.3 5.4  ? ? ?Objective:  VS:  HT:    WT:   BMI:     BP:131/81  HR:90bpm  TEMP: ( )  RESP:  ?Physical Exam ?Vitals and nursing note reviewed.  ?HENT:  ?   Head: Normocephalic and atraumatic.  ?   Right Ear: External ear normal.  ?   Left Ear: External ear normal.  ?   Nose: Nose normal.  ?   Mouth/Throat:  ?   Mouth: Mucous membranes are moist.  ?Eyes:  ?   Extraocular Movements: Extraocular movements intact.  ?Cardiovascular:  ?   Rate and Rhythm: Normal rate.  ?   Pulses: Normal pulses.  ?Pulmonary:  ?   Effort: Pulmonary effort is normal.  ?Abdominal:  ?   General: Abdomen is flat. There is no distension.  ?Musculoskeletal:     ?   General: Tenderness present.  ?   Cervical back: Normal range of motion.  ?   Comments: Pt rises from seated position to standing without difficulty. Good lumbar range of motion. Strong distal strength without clonus, no pain upon palpation of greater trochanters. Sensation intact bilaterally. Walks independently, gait steady.   ?Skin: ?   General: Skin is warm and dry.  ?   Capillary Refill: Capillary refill takes less than 2 seconds.  ?Neurological:  ?   General: No focal deficit present.  ?   Mental Status: She is alert and oriented to person, place, and time.  ?Psychiatric:     ?   Mood and Affect: Mood normal.     ?   Behavior: Behavior normal.  ?  ?Ortho Exam ? ?Imaging: ?No results found. ? ?Past Medical/Family/Surgical/Social History: ?Medications & Allergies reviewed per EMR, new medications  updated. ?Patient Active Problem List  ? Diagnosis Date Noted  ? HLD (hyperlipidemia)   ? ?Past Medical History:  ?Diagnosis Date  ? Arthritis   ? HLD (hyperlipidemia)   ? Osteoporosis   ? ?Family History  ?Problem Relation Age of Onset  ? Diabetes Mother   ? Heart attack Brother   ? ?Past Surgical History:  ?Procedure Laterality Date  ? ABDOMINAL HYSTERECTOMY    ? due to bleeding TAH/BSO  ? BREAST CYST EXCISION Left   ? ?Social History  ? ?Occupational History  ? Not on file  ?Tobacco Use  ? Smoking status: Never  ? Smokeless tobacco: Never  ?Vaping Use  ? Vaping Use: Never used  ?Substance and Sexual Activity  ? Alcohol use: No  ? Drug use: No  ? Sexual activity: Not Currently  ?  Birth control/protection: Surgical  ? ? ?

## 2021-08-21 NOTE — Progress Notes (Signed)
Follow Up for lower back pain. Pt has hx of PT, patient state it helped. Pt state lower back pain the travels down her left leg and ankle. Pt state walking and standing makes the pain worse. Pt state she takes pain meds to help ease her pain. ? ?Numeric Pain Rating Scale and Functional Assessment ?Average Pain 9 ?Pain Right Now 2 ?My pain is intermittent, sharp, stabbing, tingling, and aching ?Pain is worse with: walking, standing, and some activites ?Pain improves with: heat/ice and medication ? ? ?In the last MONTH (on 0-10 scale) has pain interfered with the following? ? ?1. General activity like being  able to carry out your everyday physical activities such as walking, climbing stairs, carrying groceries, or moving a chair?  ?Rating(6) ? ?2. Relation with others like being able to carry out your usual social activities and roles such as  activities at home, at work and in your community. ?Rating(7) ? ?3. Enjoyment of life such that you have  been bothered by emotional problems such as feeling anxious, depressed or irritable?  ?Rating(8) ? ?

## 2021-09-09 ENCOUNTER — Ambulatory Visit: Payer: Medicare HMO | Admitting: Physical Medicine and Rehabilitation

## 2021-09-09 ENCOUNTER — Ambulatory Visit: Payer: Self-pay

## 2021-09-09 ENCOUNTER — Encounter: Payer: Self-pay | Admitting: Physical Medicine and Rehabilitation

## 2021-09-09 VITALS — BP 145/87 | HR 84

## 2021-09-09 DIAGNOSIS — M5416 Radiculopathy, lumbar region: Secondary | ICD-10-CM

## 2021-09-09 MED ORDER — METHYLPREDNISOLONE ACETATE 80 MG/ML IJ SUSP
80.0000 mg | Freq: Once | INTRAMUSCULAR | Status: AC
  Administered 2021-09-09: 80 mg

## 2021-09-09 NOTE — Progress Notes (Signed)
Pt state lower back pain the travels down her left leg and ankle. Pt state walking and standing makes the pain worse. Pt state she takes pain meds to help ease her pain. ? ?Numeric Pain Rating Scale and Functional Assessment ?Average Pain 7 ? ? ?In the last MONTH (on 0-10 scale) has pain interfered with the following? ? ?1. General activity like being  able to carry out your everyday physical activities such as walking, climbing stairs, carrying groceries, or moving a chair?  ?Rating(9) ? ? ?+Driver, -BT, -Dye Allergies. ? ?

## 2021-09-09 NOTE — Patient Instructions (Signed)

## 2021-09-13 NOTE — Procedures (Signed)
Lumbar Epidural Steroid Injection - Interlaminar Approach with Fluoroscopic Guidance ? ?Patient: Taylor Singleton      ?Date of Birth: 03/06/1956 ?MRN: [id number] ?PCP: Donita Brooks, MD      ?Visit Date: 09/09/2021 ?  ?Universal Protocol:    ? ?Consent Given By: the patient ? ?Position: PRONE ? ?Additional Comments: ?Vital signs were monitored before and after the procedure. ?Patient was prepped and draped in the usual sterile fashion. ?The correct patient, procedure, and site was verified. ? ? ?Injection Procedure Details:  ? ?Procedure diagnoses: Lumbar radiculopathy [M54.16]  ? ?Meds Administered:  ?Meds ordered this encounter  ?Medications  ? methylPREDNISolone acetate (DEPO-MEDROL) injection 80 mg  ?  ? ?Laterality: Left ? ?Location/Site:  L4-5 ? ?Needle: 3.5 in., 20 ga. Tuohy ? ?Needle Placement: Paramedian epidural ? ?Findings:  ? -Comments: Excellent flow of contrast into the epidural space. ? ?Procedure Details: ?Using a paramedian approach from the side mentioned above, the region overlying the inferior lamina was localized under fluoroscopic visualization and the soft tissues overlying this structure were infiltrated with 4 ml. of 1% Lidocaine without Epinephrine. The Tuohy needle was inserted into the epidural space using a paramedian approach.  ? ?The epidural space was localized using loss of resistance along with counter oblique bi-planar fluoroscopic views.  After negative aspirate for air, blood, and CSF, a 2 ml. volume of Isovue-250 was injected into the epidural space and the flow of contrast was observed. Radiographs were obtained for documentation purposes.   ? ?The injectate was administered into the level noted above. ? ? ?Additional Comments:  ?The patient tolerated the procedure well ?Dressing: 2 x 2 sterile gauze and Band-Aid ?  ? ?Post-procedure details: ?Patient was observed during the procedure. ?Post-procedure instructions were reviewed. ? ?Patient left the clinic in stable condition. ?

## 2021-09-13 NOTE — Progress Notes (Signed)
? ?Taylor Singleton - 65 y.o. female MRN [id number]  Date of birth: Nov 06, 1955 ? ?Office Visit Note: ?Visit Date: 09/09/2021 ?PCP: Donita Brooks, MD ?Referred by: Donita Brooks, MD ? ?Subjective: ?Chief Complaint  ?Patient presents with  ? Lower Back - Pain  ? Left Leg - Pain  ? ?HPI:  Taylor Singleton is a 66 y.o. female who comes in today at the request of Ellin Goodie, FNP for planned Left L4-5 Lumbar Interlaminar epidural steroid injection with fluoroscopic guidance.  The patient has failed conservative care including home exercise, medications, time and activity modification.  This injection will be diagnostic and hopefully therapeutic.  Please see requesting physician notes for further details and justification. ? ?ROS Otherwise per HPI. ? ?Assessment & Plan: ?Visit Diagnoses:  ?  ICD-10-CM   ?1. Lumbar radiculopathy  M54.16 XR C-ARM NO REPORT  ?  Epidural Steroid injection  ?  methylPREDNISolone acetate (DEPO-MEDROL) injection 80 mg  ?  ?  ?Plan: No additional findings.  ? ?Meds & Orders:  ?Meds ordered this encounter  ?Medications  ? methylPREDNISolone acetate (DEPO-MEDROL) injection 80 mg  ?  ?Orders Placed This Encounter  ?Procedures  ? XR C-ARM NO REPORT  ? Epidural Steroid injection  ?  ?Follow-up: Return if symptoms worsen or fail to improve.  ? ?Procedures: ?No procedures performed  ?Lumbar Epidural Steroid Injection - Interlaminar Approach with Fluoroscopic Guidance ? ?Patient: Taylor Singleton      ?Date of Birth: 1956/04/25 ?MRN: [id number] ?PCP: Donita Brooks, MD      ?Visit Date: 09/09/2021 ?  ?Universal Protocol:    ? ?Consent Given By: the patient ? ?Position: PRONE ? ?Additional Comments: ?Vital signs were monitored before and after the procedure. ?Patient was prepped and draped in the usual sterile fashion. ?The correct patient, procedure, and site was verified. ? ? ?Injection Procedure Details:  ? ?Procedure diagnoses: Lumbar radiculopathy [M54.16]  ? ?Meds Administered:  ?Meds ordered this  encounter  ?Medications  ? methylPREDNISolone acetate (DEPO-MEDROL) injection 80 mg  ?  ? ?Laterality: Left ? ?Location/Site:  L4-5 ? ?Needle: 3.5 in., 20 ga. Tuohy ? ?Needle Placement: Paramedian epidural ? ?Findings:  ? -Comments: Excellent flow of contrast into the epidural space. ? ?Procedure Details: ?Using a paramedian approach from the side mentioned above, the region overlying the inferior lamina was localized under fluoroscopic visualization and the soft tissues overlying this structure were infiltrated with 4 ml. of 1% Lidocaine without Epinephrine. The Tuohy needle was inserted into the epidural space using a paramedian approach.  ? ?The epidural space was localized using loss of resistance along with counter oblique bi-planar fluoroscopic views.  After negative aspirate for air, blood, and CSF, a 2 ml. volume of Isovue-250 was injected into the epidural space and the flow of contrast was observed. Radiographs were obtained for documentation purposes.   ? ?The injectate was administered into the level noted above. ? ? ?Additional Comments:  ?The patient tolerated the procedure well ?Dressing: 2 x 2 sterile gauze and Band-Aid ?  ? ?Post-procedure details: ?Patient was observed during the procedure. ?Post-procedure instructions were reviewed. ? ?Patient left the clinic in stable condition.  ? ?Clinical History: ?IMPRESSION: ?MR THORACIC SPINE IMPRESSION ?  ?1. Flowing anterior osteophytes with partial fusion of the T5 ?through T7 vertebral bodies. ?2. Trace anterolisthesis of T2 on T3, T3 on T4, and T4 on T5. ?3. Otherwise, mild degenerative changes as detailed above without ?high-grade spinal canal or neural foraminal stenosis. ?  ?MR LUMBAR SPINE  IMPRESSION ?  ?1. Degenerative changes in the lumbar spine are most advanced at ?L4-L5 where there is mild-to-moderate spinal canal stenosis with ?crowding of the subarticular zones with possible mass effect on the ?traversing right L5 nerve root, and moderate to  severe right and ?mild left neural foraminal stenosis. ?2. Mild crowding of the left subarticular zone and mild-to-moderate ?left neural foraminal stenosis at T12-L1. No evidence of nerve root ?impingement ?3. Crowding of the left subarticular zone without definite evidence ?of nerve root impingement and mild left worse than right neural ?foraminal stenosis at L3-L4. ?4. Multilevel facet arthropathy, most advanced at L4-L5 and L5-S1, ?slightly worse on the left. Mild enlargement of the common bile duct ?measuring up to 1.1 cm. Correlate with LFTs, and consider dedicated ?MRCP as indicated. ?  ?  ?Electronically Signed ?  By: Lesia Hausen M.D. ?  On: 04/28/2021 14:53  ? ? ? ?Objective:  VS:  HT:    WT:   BMI:     BP:(!) 145/87  HR:84bpm  TEMP: ( )  RESP:  ?Physical Exam ?Vitals and nursing note reviewed.  ?Constitutional:   ?   General: She is not in acute distress. ?   Appearance: Normal appearance. She is not ill-appearing.  ?HENT:  ?   Head: Normocephalic and atraumatic.  ?   Right Ear: External ear normal.  ?   Left Ear: External ear normal.  ?Eyes:  ?   Extraocular Movements: Extraocular movements intact.  ?Cardiovascular:  ?   Rate and Rhythm: Normal rate.  ?   Pulses: Normal pulses.  ?Pulmonary:  ?   Effort: Pulmonary effort is normal. No respiratory distress.  ?Abdominal:  ?   General: There is no distension.  ?   Palpations: Abdomen is soft.  ?Musculoskeletal:     ?   General: Tenderness present.  ?   Cervical back: Neck supple.  ?   Right lower leg: No edema.  ?   Left lower leg: No edema.  ?   Comments: Patient has good distal strength with no pain over the greater trochanters.  No clonus or focal weakness.  ?Skin: ?   Findings: No erythema, lesion or rash.  ?Neurological:  ?   General: No focal deficit present.  ?   Mental Status: She is alert and oriented to person, place, and time.  ?   Sensory: No sensory deficit.  ?   Motor: No weakness or abnormal muscle tone.  ?   Coordination: Coordination  normal.  ?Psychiatric:     ?   Mood and Affect: Mood normal.     ?   Behavior: Behavior normal.  ?  ? ?Imaging: ?No results found. ?

## 2021-09-29 ENCOUNTER — Encounter: Payer: Self-pay | Admitting: Family Medicine

## 2021-09-29 ENCOUNTER — Ambulatory Visit (INDEPENDENT_AMBULATORY_CARE_PROVIDER_SITE_OTHER): Payer: Medicare HMO | Admitting: Family Medicine

## 2021-09-29 VITALS — BP 108/72 | HR 85 | Temp 96.8°F | Resp 18 | Ht 61.0 in | Wt 165.0 lb

## 2021-09-29 DIAGNOSIS — H9201 Otalgia, right ear: Secondary | ICD-10-CM

## 2021-09-29 MED ORDER — AMOXICILLIN 875 MG PO TABS: 875.0000 mg | ORAL_TABLET | Freq: Two times a day (BID) | ORAL | 0 refills | Status: AC

## 2021-09-29 MED ORDER — DICLOFENAC SODIUM 75 MG PO TBEC: 75.0000 mg | DELAYED_RELEASE_TABLET | Freq: Two times a day (BID) | ORAL | 0 refills | Status: DC

## 2021-09-29 NOTE — Progress Notes (Signed)
? ?Subjective:  ? ? Patient ID: Taylor Singleton, female    DOB: February 14, 1956, 66 y.o.   MRN: [id number] ?Patient reports right-sided otalgia.  She states it started yesterday.  She has sharp sudden pains in her right ear that are unprovoked.  They last seconds and then resolved.  She denies any fevers or chills.  She denies any rhinorrhea or head congestion.  She denies any sinus pain or sore throat.  On examination today, the right tympanic membrane is clear.  There is no erythema.  There is no middle ear effusion.  There is no exudate in the auditory canal.  Therefore I feel the pain is likely neuropathic or referred pain potentially due to arthritis or maybe even early shingles.  There is no rash on the scalp although she does report some soreness in the scalp around the ear.  She denies any neck pain. ?Past Medical History:  ?Diagnosis Date  ? Arthritis   ? HLD (hyperlipidemia)   ? Osteoporosis   ? ?Past Surgical History:  ?Procedure Laterality Date  ? ABDOMINAL HYSTERECTOMY    ? due to bleeding TAH/BSO  ? BREAST CYST EXCISION Left   ? ?Current Outpatient Medications on File Prior to Visit  ?Medication Sig Dispense Refill  ? alendronate (FOSAMAX) 70 MG tablet Take 1 tablet (70 mg total) by mouth every 7 (seven) days. Take with a full glass of water on an empty stomach. 4 tablet 11  ? atorvastatin (LIPITOR) 20 MG tablet TAKE 1 TABLET BY MOUTH EVERY DAY 90 tablet 3  ? Calcium Carb-Cholecalciferol (CALCIUM/VITAMIN D) 600-400 MG-UNIT TABS Take 2 tablets by mouth daily.    ? Omega-3 Fatty Acids (FISH OIL OMEGA-3 PO) Take by mouth.    ? celecoxib (CELEBREX) 200 MG capsule Take 1 capsule (200 mg total) by mouth daily. (Patient not taking: Reported on 09/29/2021) 30 capsule 2  ? TURMERIC PO Take by mouth. (Patient not taking: Reported on 09/29/2021)    ? ?No current facility-administered medications on file prior to visit.  ? ?No Known Allergies ?Social History  ? ?Socioeconomic History  ? Marital status: Married  ?  Spouse  name: Not on file  ? Number of children: 2  ? Years of education: Not on file  ? Highest education level: Not on file  ?Occupational History  ? Not on file  ?Tobacco Use  ? Smoking status: Never  ? Smokeless tobacco: Never  ?Vaping Use  ? Vaping Use: Never used  ?Substance and Sexual Activity  ? Alcohol use: No  ? Drug use: No  ? Sexual activity: Not Currently  ?  Birth control/protection: Surgical  ?Other Topics Concern  ? Not on file  ?Social History Narrative  ? Not on file  ? ?Social Determinants of Health  ? ?Financial Resource Strain: Low Risk   ? Difficulty of Paying Living Expenses: Not hard at all  ?Food Insecurity: No Food Insecurity  ? Worried About Programme researcher, broadcasting/film/video in the Last Year: Never true  ? Ran Out of Food in the Last Year: Never true  ?Transportation Needs: No Transportation Needs  ? Lack of Transportation (Medical): No  ? Lack of Transportation (Non-Medical): No  ?Physical Activity: Sufficiently Active  ? Days of Exercise per Week: 7 days  ? Minutes of Exercise per Session: 30 min  ?Stress: No Stress Concern Present  ? Feeling of Stress : Not at all  ?Social Connections: Moderately Isolated  ? Frequency of Communication with Friends and Family: More than  three times a week  ? Frequency of Social Gatherings with Friends and Family: More than three times a week  ? Attends Religious Services: Never  ? Active Member of Clubs or Organizations: No  ? Attends Banker Meetings: Never  ? Marital Status: Married  ?Intimate Partner Violence: Not At Risk  ? Fear of Current or Ex-Partner: No  ? Emotionally Abused: No  ? Physically Abused: No  ? Sexually Abused: No  ? ? ? ?Review of Systems ? ?   ?Objective:  ? Physical Exam ?Constitutional:   ?   Appearance: Normal appearance. She is well-developed and normal weight.  ?HENT:  ?   Head:  ? ?   Right Ear: Tympanic membrane and ear canal normal.  ?   Left Ear: Tympanic membrane and ear canal normal.  ?   Nose: No congestion or rhinorrhea.  ?    Mouth/Throat:  ?   Pharynx: No oropharyngeal exudate or posterior oropharyngeal erythema.  ?Cardiovascular:  ?   Rate and Rhythm: Normal rate and regular rhythm.  ?   Heart sounds: Normal heart sounds.  ?Pulmonary:  ?   Effort: Pulmonary effort is normal. No respiratory distress.  ?   Breath sounds: No wheezing or rales.  ?Musculoskeletal:  ?   Cervical back: Neck supple.  ?Lymphadenopathy:  ?   Cervical: No cervical adenopathy.  ?Neurological:  ?   Mental Status: She is alert.  ? ? ? ? ? ?   ?Assessment & Plan:  ?Acute otalgia, right ?I am uncertain as to the cause of the otalgia.  He could be referred pain or neuropathic pain.  I asked the patient to monitor for any evidence of shingles developing on the scalp.  If a rash develops I will treat with Valtrex.  I will start her on diclofenac 75 mg twice daily for the pain.  If gradually improves it may be referred pain due to arthritis or nerve impingement in the neck.  If she develops fevers or chills or sinus pain I will give her amoxicillin for possible otitis media that I just misdiagnosed however her ear appears normal today on exam.  Monitor over the next 48 hours for any changes or progression.  I did treat a lesion on her forehead with liquid nitrogen cryotherapy for 30 seconds.  It is a 3 mm hard red scab with scale just above her left eye.  If the lesion persist I would recommend dermatology consultation for biopsy ?

## 2021-10-27 DIAGNOSIS — R69 Illness, unspecified: Secondary | ICD-10-CM | POA: Diagnosis not present

## 2021-10-27 DIAGNOSIS — M79661 Pain in right lower leg: Secondary | ICD-10-CM | POA: Diagnosis not present

## 2021-10-27 DIAGNOSIS — R600 Localized edema: Secondary | ICD-10-CM | POA: Diagnosis not present

## 2021-10-27 DIAGNOSIS — M79662 Pain in left lower leg: Secondary | ICD-10-CM | POA: Diagnosis not present

## 2021-10-27 DIAGNOSIS — G2581 Restless legs syndrome: Secondary | ICD-10-CM | POA: Diagnosis not present

## 2021-11-03 ENCOUNTER — Ambulatory Visit (INDEPENDENT_AMBULATORY_CARE_PROVIDER_SITE_OTHER): Payer: Medicare HMO | Admitting: Family Medicine

## 2021-11-03 ENCOUNTER — Encounter: Payer: Self-pay | Admitting: Family Medicine

## 2021-11-03 VITALS — BP 112/70 | HR 91 | Ht 61.0 in | Wt 168.0 lb

## 2021-11-03 DIAGNOSIS — R42 Dizziness and giddiness: Secondary | ICD-10-CM

## 2021-11-03 DIAGNOSIS — I872 Venous insufficiency (chronic) (peripheral): Secondary | ICD-10-CM | POA: Diagnosis not present

## 2021-11-03 MED ORDER — SCOPOLAMINE 1 MG/3DAYS TD PT72: 1.0000 | MEDICATED_PATCH | TRANSDERMAL | 12 refills | Status: DC

## 2021-11-03 NOTE — Progress Notes (Signed)
Subjective:    Patient ID: Taylor Singleton, female    DOB: [birth year], 66 y.o.   MRN: [id number] 12/26/20 Over the weekend, the patient developed severe vertigo.  She states that if she rolled over to the left side in bed, the room would start spinning.  She was having such severe vertigo that she could not keep anything down.  She was vomiting.  At 1 point she was sitting on the toilet holding a bucket.  She got extremely dehydrated and had to go to the hospital.  They gave her meclizine and IV fluids at the hospital.  She has been performing Epley maneuvers at home and the symptoms are gradually improving.  She states that she is approximately 90% better today.  She is concerned because her blood sugar at the hospital was elevated at 125.  She has a family history of diabetes and she states that she had not eaten anything for quite some time prior to having that blood drawn although she may have been on IV fluid.  At that time, my plan was:  We discussed vertigo and its natural cause and how to perform Epley maneuvers.  Also gave her prescription for scopolamine patches if she needs them in the future as well as Zofran if she needs it in the future.  This time she seems to be much better and gradually improving.  Continue Epley maneuvers until resolved.  I will check an A1c given her random sugar that was elevated  01/19/21 Patient states the symptoms of gotten worse.  She is no longer having classic vertigo.  She states that she constantly feels off balance and has disequilibrium.  Certainly if she turns her head in certain directions it seems to be worse.  However she is no longer having the sensation of the room spinning however she is having a constant feeling of being out of balance.  She is staggering into walls at home.  Even today in the office, she is having to walk down the hallway with her arm extended to help balance her self against the wall.  Therefore she is demonstrating ataxia.  She is also  having episodes of hearing loss and occasional tinnitus.  She does have a family history of Mnire's disease.  Her father had Mnire's disease.  She denies any permanent hearing loss but she has noticed episodes of tinnitus and perhaps some mild hearing loss when she has episodes of vertigo.  I am very concerned about the ataxia however that she demonstrates today.  AT that time, my plan was: Patient was having vertigo.  However now she is having symptoms of ataxia.  She is having to hold onto walls at home to walk so that she does not lose her balance and fall.  Even here today she is having to walk with her fingers extended against the wall to help maintain her balance.  Otherwise she has a unsteady gait and staggers from side to side.  Furthermore the "vertigo" has become constant and unrelated to position changes.  Therefore I am going to obtain an MRI of the brain to evaluate further.  If the MRI of the brain is abnormal we will certainly consult neurology.  If the MRI is normal, I am also going to try the patient on hydrochlorothiazide empirically to see if there may be an element of Mnire's disease.  Consider referral to ENT if patient improves on hydrochlorothiazide.  If none of the above apply, will refer the patient for  formal Epley maneuvers  11/03/21 Insurance denied the MRI.  However the patient's symptoms subsided and has been symptom-free ever since September of last year.  About 2 weeks ago the symptoms returned.  She now reports dizziness and lightheadedness every time she lies down on the left side.  Today she has a positive Dix-Hallpike maneuver to the left.  It is always associated with motion and lying down on her left side in this case.  She states that it feels slightly different than it did last time.  There is not as much of a rotational component but there is a feeling of movement and unsteadiness.  She also has developed tinnitus this time.  Her father had Mnire's disease and she  is concerned it could be Mnire's disease.  There is no other neurologic deficits. Past Medical History:  Diagnosis Date   Arthritis    HLD (hyperlipidemia)    Osteoporosis    Past Surgical History:  Procedure Laterality Date   ABDOMINAL HYSTERECTOMY     due to bleeding TAH/BSO   BREAST CYST EXCISION Left    Current Outpatient Medications on File Prior to Visit  Medication Sig Dispense Refill   alendronate (FOSAMAX) 70 MG tablet Take 1 tablet (70 mg total) by mouth every 7 (seven) days. Take with a full glass of water on an empty stomach. 4 tablet 11   atorvastatin (LIPITOR) 20 MG tablet TAKE 1 TABLET BY MOUTH EVERY DAY 90 tablet 3   Calcium Carb-Cholecalciferol (CALCIUM/VITAMIN D) 600-400 MG-UNIT TABS Take 2 tablets by mouth daily.     Omega-3 Fatty Acids (FISH OIL OMEGA-3 PO) Take by mouth.     TURMERIC PO Take by mouth.     celecoxib (CELEBREX) 200 MG capsule Take 1 capsule (200 mg total) by mouth daily. (Patient not taking: Reported on 11/03/2021) 30 capsule 2   diclofenac (VOLTAREN) 75 MG EC tablet Take 1 tablet (75 mg total) by mouth 2 (two) times daily. (Patient not taking: Reported on 11/03/2021) 30 tablet 0   No current facility-administered medications on file prior to visit.   No Known Allergies Social History   Socioeconomic History   Marital status: Married    Spouse name: Not on file   Number of children: 2   Years of education: Not on file   Highest education level: Not on file  Occupational History   Not on file  Tobacco Use   Smoking status: Never   Smokeless tobacco: Never  Vaping Use   Vaping Use: Never used  Substance and Sexual Activity   Alcohol use: No   Drug use: No   Sexual activity: Not Currently    Birth control/protection: Surgical  Other Topics Concern   Not on file  Social History Narrative   Not on file   Social Determinants of Health   Financial Resource Strain: Low Risk    Difficulty of Paying Living Expenses: Not hard at all   Food Insecurity: No Food Insecurity   Worried About Programme researcher, broadcasting/film/video in the Last Year: Never true   Ran Out of Food in the Last Year: Never true  Transportation Needs: No Transportation Needs   Lack of Transportation (Medical): No   Lack of Transportation (Non-Medical): No  Physical Activity: Sufficiently Active   Days of Exercise per Week: 7 days   Minutes of Exercise per Session: 30 min  Stress: No Stress Concern Present   Feeling of Stress : Not at all  Social Connections: Moderately Isolated  Frequency of Communication with Friends and Family: More than three times a week   Frequency of Social Gatherings with Friends and Family: More than three times a week   Attends Religious Services: Never   Database administrator or Organizations: No   Attends Engineer, structural: Never   Marital Status: Married  Catering manager Violence: Not At Risk   Fear of Current or Ex-Partner: No   Emotionally Abused: No   Physically Abused: No   Sexually Abused: No     Review of Systems  All other systems reviewed and are negative.     Objective:   Physical Exam Constitutional:      General: She is not in acute distress.    Appearance: Normal appearance. She is well-developed and normal weight. She is not ill-appearing or toxic-appearing.  HENT:     Right Ear: Tympanic membrane and ear canal normal.     Left Ear: Tympanic membrane and ear canal normal.     Mouth/Throat:     Pharynx: No oropharyngeal exudate or posterior oropharyngeal erythema.  Cardiovascular:     Rate and Rhythm: Normal rate and regular rhythm.     Heart sounds: Normal heart sounds.  Pulmonary:     Effort: Pulmonary effort is normal. No respiratory distress.     Breath sounds: No wheezing or rales.  Musculoskeletal:     Cervical back: Neck supple.  Lymphadenopathy:     Cervical: No cervical adenopathy.  Neurological:     Mental Status: She is alert and oriented to person, place, and time.      Cranial Nerves: No cranial nerve deficit.     Sensory: No sensory deficit.     Motor: No weakness.     Gait: Gait normal.  Psychiatric:        Mood and Affect: Mood normal.        Behavior: Behavior normal.        Thought Content: Thought content normal.        Judgment: Judgment normal.          Assessment & Plan:  Vertigo - Plan: Ambulatory referral to ENT, scopolamine (TRANSDERM-SCOP) 1 MG/3DAYS I believe the patient is dealing with BPPV similar to last year.  I recommended trying scopolamine patches every 72 hours to dampen symptoms.  I tried to reassure the patient that symptoms will likely subside over the next 2 weeks on her iron.  She would like a second opinion with an ENT given her history of Mnire's disease and I am fine scheduling that for her.

## 2021-11-10 DIAGNOSIS — G2581 Restless legs syndrome: Secondary | ICD-10-CM | POA: Diagnosis not present

## 2021-11-10 DIAGNOSIS — R600 Localized edema: Secondary | ICD-10-CM | POA: Diagnosis not present

## 2021-11-10 DIAGNOSIS — I872 Venous insufficiency (chronic) (peripheral): Secondary | ICD-10-CM | POA: Diagnosis not present

## 2021-11-27 ENCOUNTER — Telehealth: Payer: Self-pay

## 2021-11-27 NOTE — Telephone Encounter (Signed)
Pt called in stating that she is still having issues with headaches. Pt states that she was seen pcp recently about this. Pt wanted to know if its possible to get a referral for an MRI. Please advise.  Cb#: [phone]

## 2021-11-30 ENCOUNTER — Ambulatory Visit (INDEPENDENT_AMBULATORY_CARE_PROVIDER_SITE_OTHER): Payer: Medicare HMO | Admitting: Family Medicine

## 2021-11-30 VITALS — BP 118/68 | HR 91 | Temp 98.1°F | Ht 61.0 in | Wt 168.4 lb

## 2021-11-30 DIAGNOSIS — R42 Dizziness and giddiness: Secondary | ICD-10-CM | POA: Diagnosis not present

## 2021-11-30 DIAGNOSIS — G4452 New daily persistent headache (NDPH): Secondary | ICD-10-CM | POA: Diagnosis not present

## 2021-11-30 DIAGNOSIS — R270 Ataxia, unspecified: Secondary | ICD-10-CM | POA: Diagnosis not present

## 2021-11-30 NOTE — Progress Notes (Signed)
Subjective:    Patient ID: Taylor Singleton, female    DOB: [birth year], 66 y.o.   MRN: [id number] 12/26/20 Over the weekend, the patient developed severe vertigo.  She states that if she rolled over to the left side in bed, the room would start spinning.  She was having such severe vertigo that she could not keep anything down.  She was vomiting.  At 1 point she was sitting on the toilet holding a bucket.  She got extremely dehydrated and had to go to the hospital.  They gave her meclizine and IV fluids at the hospital.  She has been performing Epley maneuvers at home and the symptoms are gradually improving.  She states that she is approximately 90% better today.  She is concerned because her blood sugar at the hospital was elevated at 125.  She has a family history of diabetes and she states that she had not eaten anything for quite some time prior to having that blood drawn although she may have been on IV fluid.  At that time, my plan was:  We discussed vertigo and its natural cause and how to perform Epley maneuvers.  Also gave her prescription for scopolamine patches if she needs them in the future as well as Zofran if she needs it in the future.  This time she seems to be much better and gradually improving.  Continue Epley maneuvers until resolved.  I will check an A1c given her random sugar that was elevated  01/19/21 Patient states the symptoms of gotten worse.  She is no longer having classic vertigo.  She states that she constantly feels off balance and has disequilibrium.  Certainly if she turns her head in certain directions it seems to be worse.  However she is no longer having the sensation of the room spinning however she is having a constant feeling of being out of balance.  She is staggering into walls at home.  Even today in the office, she is having to walk down the hallway with her arm extended to help balance her self against the wall.  Therefore she is demonstrating ataxia.  She is also  having episodes of hearing loss and occasional tinnitus.  She does have a family history of Mnire's disease.  Her father had Mnire's disease.  She denies any permanent hearing loss but she has noticed episodes of tinnitus and perhaps some mild hearing loss when she has episodes of vertigo.  I am very concerned about the ataxia however that she demonstrates today.  AT that time, my plan was: Patient was having vertigo.  However now she is having symptoms of ataxia.  She is having to hold onto walls at home to walk so that she does not lose her balance and fall.  Even here today she is having to walk with her fingers extended against the wall to help maintain her balance.  Otherwise she has a unsteady gait and staggers from side to side.  Furthermore the "vertigo" has become constant and unrelated to position changes.  Therefore I am going to obtain an MRI of the brain to evaluate further.  If the MRI of the brain is abnormal we will certainly consult neurology.  If the MRI is normal, I am also going to try the patient on hydrochlorothiazide empirically to see if there may be an element of Mnire's disease.  Consider referral to ENT if patient improves on hydrochlorothiazide.  If none of the above apply, will refer the patient for  formal Epley maneuvers  11/03/21 Insurance denied the MRI.  However the patient's symptoms subsided and has been symptom-free ever since September of last year.  About 2 weeks ago the symptoms returned.  She now reports dizziness and lightheadedness every time she lies down on the left side.  Today she has a positive Dix-Hallpike maneuver to the left.  It is always associated with motion and lying down on her left side in this case.  She states that it feels slightly different than it did last time.  There is not as much of a rotational component but there is a feeling of movement and unsteadiness.  She also has developed tinnitus this time.  Her father had Mnire's disease and she  is concerned it could be Mnire's disease.  There is no other neurologic deficits.  At that time, my plan was:  I believe the patient is dealing with BPPV similar to last year.  I recommended trying scopolamine patches every 72 hours to dampen symptoms.  I tried to reassure the patient that symptoms will likely subside over the next 2 weeks on her iron.  She would like a second opinion with an ENT given her history of Mnire's disease and I am fine scheduling that for her.  11/30/21 Patient states that the vertigo is getting worse.  She states that she can now have the sensations of vertigo even if she is lying still or sitting still.  She hesitates to stand up due to the fact she is afraid that the room will begin spinning.  She feels off balance.  She states that she is losing her balance at home and has occasionally stumbled in the walls.  Her symptoms sound like ataxia.  Today finger-to-nose testing is normal.  Romberg testing is normal.  However she does have an unsteady gait when she first stands up.  She also states that she started to get daily constant headaches in both temples and in her forehead.  This has been going on now every day for the last 3 weeks.  She denies any hearing loss or double vision or blurry vision or nausea or vomiting Past Medical History:  Diagnosis Date   Arthritis    HLD (hyperlipidemia)    Osteoporosis    Past Surgical History:  Procedure Laterality Date   ABDOMINAL HYSTERECTOMY     due to bleeding TAH/BSO   BREAST CYST EXCISION Left    Current Outpatient Medications on File Prior to Visit  Medication Sig Dispense Refill   alendronate (FOSAMAX) 70 MG tablet Take 1 tablet (70 mg total) by mouth every 7 (seven) days. Take with a full glass of water on an empty stomach. 4 tablet 11   atorvastatin (LIPITOR) 20 MG tablet TAKE 1 TABLET BY MOUTH EVERY DAY 90 tablet 3   Calcium Carb-Cholecalciferol (CALCIUM/VITAMIN D) 600-400 MG-UNIT TABS Take 2 tablets by mouth  daily.     celecoxib (CELEBREX) 200 MG capsule Take 1 capsule (200 mg total) by mouth daily. (Patient not taking: Reported on 11/03/2021) 30 capsule 2   diclofenac (VOLTAREN) 75 MG EC tablet Take 1 tablet (75 mg total) by mouth 2 (two) times daily. (Patient not taking: Reported on 11/03/2021) 30 tablet 0   Omega-3 Fatty Acids (FISH OIL OMEGA-3 PO) Take by mouth.     scopolamine (TRANSDERM-SCOP) 1 MG/3DAYS Place 1 patch (1.5 mg total) onto the skin every 3 (three) days. 10 patch 12   TURMERIC PO Take by mouth.     No  current facility-administered medications on file prior to visit.   No Known Allergies Social History   Socioeconomic History   Marital status: Married    Spouse name: Not on file   Number of children: 2   Years of education: Not on file   Highest education level: Not on file  Occupational History   Not on file  Tobacco Use   Smoking status: Never   Smokeless tobacco: Never  Vaping Use   Vaping Use: Never used  Substance and Sexual Activity   Alcohol use: No   Drug use: No   Sexual activity: Not Currently    Birth control/protection: Surgical  Other Topics Concern   Not on file  Social History Narrative   Not on file   Social Determinants of Health   Financial Resource Strain: Low Risk  (11/28/2020)   Overall Financial Resource Strain (CARDIA)    Difficulty of Paying Living Expenses: Not hard at all  Food Insecurity: No Food Insecurity (11/28/2020)   Hunger Vital Sign    Worried About Running Out of Food in the Last Year: Never true    Ran Out of Food in the Last Year: Never true  Transportation Needs: No Transportation Needs (11/28/2020)   PRAPARE - Administrator, Civil Service (Medical): No    Lack of Transportation (Non-Medical): No  Physical Activity: Sufficiently Active (11/28/2020)   Exercise Vital Sign    Days of Exercise per Week: 7 days    Minutes of Exercise per Session: 30 min  Stress: No Stress Concern Present (11/28/2020)   [employer] of [employer]ational [employer] - Occupational Stress Questionnaire    Feeling of Stress : Not at all  Social Connections: Moderately Isolated (11/28/2020)   Social Connection and Isolation Panel [NHANES]    Frequency of Communication with Friends and Family: More than three times a week    Frequency of Social Gatherings with Friends and Family: More than three times a week    Attends Religious Services: Never    Database administrator or Organizations: No    Attends Banker Meetings: Never    Marital Status: Married  Catering manager Violence: Not At Risk (11/28/2020)   Humiliation, Afraid, Rape, and Kick questionnaire    Fear of Current or Ex-Partner: No    Emotionally Abused: No    Physically Abused: No    Sexually Abused: No     Review of Systems  All other systems reviewed and are negative.      Objective:   Physical Exam Constitutional:      General: She is not in acute distress.    Appearance: Normal appearance. She is well-developed and normal weight. She is not ill-appearing or toxic-appearing.  HENT:     Right Ear: Tympanic membrane and ear canal normal.     Left Ear: Tympanic membrane and ear canal normal.     Mouth/Throat:     Pharynx: No oropharyngeal exudate or posterior oropharyngeal erythema.  Cardiovascular:     Rate and Rhythm: Normal rate and regular rhythm.     Heart sounds: Normal heart sounds.  Pulmonary:     Effort: Pulmonary effort is normal. No respiratory distress.     Breath sounds: No wheezing or rales.  Musculoskeletal:     Cervical back: Neck supple.  Lymphadenopathy:     Cervical: No cervical adenopathy.  Neurological:     Mental Status: She is alert and oriented to person, place, and time.  Cranial Nerves: No cranial nerve deficit, dysarthria or facial asymmetry.     Sensory: No sensory deficit.     Motor: No weakness, tremor, atrophy, abnormal muscle tone or seizure activity.     Coordination: Romberg sign negative.  Coordination normal. Finger-Nose-Finger Test normal.     Gait: Gait abnormal.     Deep Tendon Reflexes: Reflexes normal.  Psychiatric:        Mood and Affect: Mood normal.        Behavior: Behavior normal.        Thought Content: Thought content normal.        Judgment: Judgment normal.           Assessment & Plan:  Ataxia - Plan: MR Brain W Wo Contrast  Vertigo - Plan: MR Brain W Wo Contrast  New persistent daily headache - Plan: MR Brain W Wo Contrast At this point, I am not comfortable with the diagnosis that this is simply vertigo.  Vertigo would not explain the new daily persistent headache.  Furthermore the patient is occasionally staggering in the walls at home and losing her balance.  I am again not comfortable restarting vertigo as a potential cause for this.  Therefore I feel that we need to proceed with an MRI of the brain without order for the patient.  If insurance refuses the MRI, I will proceed with a neurology consultation.

## 2021-12-01 DIAGNOSIS — I83892 Varicose veins of left lower extremities with other complications: Secondary | ICD-10-CM | POA: Diagnosis not present

## 2021-12-03 DIAGNOSIS — I83891 Varicose veins of right lower extremities with other complications: Secondary | ICD-10-CM | POA: Diagnosis not present

## 2021-12-13 ENCOUNTER — Ambulatory Visit
Admission: RE | Admit: 2021-12-13 | Discharge: 2021-12-13 | Disposition: A | Discharge status: Home / Self Care | Payer: Medicare HMO | Source: Ambulatory Visit | Attending: Family Medicine | Admitting: Family Medicine

## 2021-12-13 DIAGNOSIS — R42 Dizziness and giddiness: Secondary | ICD-10-CM

## 2021-12-13 DIAGNOSIS — I6782 Cerebral ischemia: Secondary | ICD-10-CM | POA: Diagnosis not present

## 2021-12-13 DIAGNOSIS — G4452 New daily persistent headache (NDPH): Secondary | ICD-10-CM | POA: Diagnosis not present

## 2021-12-13 DIAGNOSIS — R270 Ataxia, unspecified: Secondary | ICD-10-CM

## 2021-12-13 MED ORDER — GADOBENATE DIMEGLUMINE 529 MG/ML IV SOLN
18.0000 mL | Freq: Once | INTRAVENOUS | Status: AC | PRN
  Administered 2021-12-13: 18 mL via INTRAVENOUS

## 2021-12-22 ENCOUNTER — Ambulatory Visit (INDEPENDENT_AMBULATORY_CARE_PROVIDER_SITE_OTHER): Payer: Medicare HMO | Admitting: Family Medicine

## 2021-12-22 VITALS — BP 115/80 | HR 77 | Temp 98.3°F | Ht 61.0 in | Wt 166.0 lb

## 2021-12-22 DIAGNOSIS — R9089 Other abnormal findings on diagnostic imaging of central nervous system: Secondary | ICD-10-CM | POA: Diagnosis not present

## 2021-12-22 MED ORDER — ATORVASTATIN CALCIUM 40 MG PO TABS: 40.0000 mg | ORAL_TABLET | Freq: Every day | ORAL | 3 refills | Status: DC

## 2021-12-22 NOTE — Progress Notes (Signed)
Subjective:    Patient ID: Taylor Singleton, female    DOB: [birth year], 66 y.o.   MRN: [id number] 12/26/20 Over the weekend, the patient developed severe vertigo.  She states that if she rolled over to the left side in bed, the room would start spinning.  She was having such severe vertigo that she could not keep anything down.  She was vomiting.  At 1 point she was sitting on the toilet holding a bucket.  She got extremely dehydrated and had to go to the hospital.  They gave her meclizine and IV fluids at the hospital.  She has been performing Epley maneuvers at home and the symptoms are gradually improving.  She states that she is approximately 90% better today.  She is concerned because her blood sugar at the hospital was elevated at 125.  She has a family history of diabetes and she states that she had not eaten anything for quite some time prior to having that blood drawn although she may have been on IV fluid.  At that time, my plan was:  We discussed vertigo and its natural cause and how to perform Epley maneuvers.  Also gave her prescription for scopolamine patches if she needs them in the future as well as Zofran if she needs it in the future.  This time she seems to be much better and gradually improving.  Continue Epley maneuvers until resolved.  I will check an A1c given her random sugar that was elevated  01/19/21 Patient states the symptoms of gotten worse.  She is no longer having classic vertigo.  She states that she constantly feels off balance and has disequilibrium.  Certainly if she turns her head in certain directions it seems to be worse.  However she is no longer having the sensation of the room spinning however she is having a constant feeling of being out of balance.  She is staggering into walls at home.  Even today in the office, she is having to walk down the hallway with her arm extended to help balance her self against the wall.  Therefore she is demonstrating ataxia.  She is also  having episodes of hearing loss and occasional tinnitus.  She does have a family history of Mnire's disease.  Her father had Mnire's disease.  She denies any permanent hearing loss but she has noticed episodes of tinnitus and perhaps some mild hearing loss when she has episodes of vertigo.  I am very concerned about the ataxia however that she demonstrates today.  AT that time, my plan was: Patient was having vertigo.  However now she is having symptoms of ataxia.  She is having to hold onto walls at home to walk so that she does not lose her balance and fall.  Even here today she is having to walk with her fingers extended against the wall to help maintain her balance.  Otherwise she has a unsteady gait and staggers from side to side.  Furthermore the "vertigo" has become constant and unrelated to position changes.  Therefore I am going to obtain an MRI of the brain to evaluate further.  If the MRI of the brain is abnormal we will certainly consult neurology.  If the MRI is normal, I am also going to try the patient on hydrochlorothiazide empirically to see if there may be an element of Mnire's disease.  Consider referral to ENT if patient improves on hydrochlorothiazide.  If none of the above apply, will refer the patient for  formal Epley maneuvers  11/03/21 Insurance denied the MRI.  However the patient's symptoms subsided and has been symptom-free ever since September of last year.  About 2 weeks ago the symptoms returned.  She now reports dizziness and lightheadedness every time she lies down on the left side.  Today she has a positive Dix-Hallpike maneuver to the left.  It is always associated with motion and lying down on her left side in this case.  She states that it feels slightly different than it did last time.  There is not as much of a rotational component but there is a feeling of movement and unsteadiness.  She also has developed tinnitus this time.  Her father had Mnire's disease and she  is concerned it could be Mnire's disease.  There is no other neurologic deficits.  At that time, my plan was:  I believe the patient is dealing with BPPV similar to last year.  I recommended trying scopolamine patches every 72 hours to dampen symptoms.  I tried to reassure the patient that symptoms will likely subside over the next 2 weeks on her iron.  She would like a second opinion with an ENT given her history of Mnire's disease and I am fine scheduling that for her.  11/30/21 Patient states that the vertigo is getting worse.  She states that she can now have the sensations of vertigo even if she is lying still or sitting still.  She hesitates to stand up due to the fact she is afraid that the room will begin spinning.  She feels off balance.  She states that she is losing her balance at home and has occasionally stumbled in the walls.  Her symptoms sound like ataxia.  Today finger-to-nose testing is normal.  Romberg testing is normal.  However she does have an unsteady gait when she first stands up.  She also states that she started to get daily constant headaches in both temples and in her forehead.  This has been going on now every day for the last 3 weeks.  She denies any hearing loss or double vision or blurry vision or nausea or vomiting.  At that time, my plan was:  At this point, I am not comfortable with the diagnosis that this is simply vertigo.  Vertigo would not explain the new daily persistent headache.  Furthermore the patient is occasionally staggering in the walls at home and losing her balance.  I am again not comfortable restarting vertigo as a potential cause for this.  Therefore I feel that we need to proceed with an MRI of the brain without order for the patient.  If insurance refuses the MRI, I will proceed with a neurology consultation.  12/22/21 MRI showed no acute findings.  MRI showed chronic microvascular ischemic changes throughout the cerebral white matter but otherwise  was normal and unremarkable.  She has an appointment to see ENT to discuss possible Mnire's disease however her symptoms are more consistent with benign paroxysmal positional vertigo.  She states that she is feeling better.  The headaches have improved Past Medical History:  Diagnosis Date   Arthritis    HLD (hyperlipidemia)    Osteoporosis    Past Surgical History:  Procedure Laterality Date   ABDOMINAL HYSTERECTOMY     due to bleeding TAH/BSO   BREAST CYST EXCISION Left    Current Outpatient Medications on File Prior to Visit  Medication Sig Dispense Refill   alendronate (FOSAMAX) 70 MG tablet Take 1 tablet (70  mg total) by mouth every 7 (seven) days. Take with a full glass of water on an empty stomach. 4 tablet 11   Calcium Carb-Cholecalciferol (CALCIUM/VITAMIN D) 600-400 MG-UNIT TABS Take 2 tablets by mouth daily.     Omega-3 Fatty Acids (FISH OIL OMEGA-3 PO) Take by mouth.     celecoxib (CELEBREX) 200 MG capsule Take 1 capsule (200 mg total) by mouth daily. (Patient not taking: Reported on 12/22/2021) 30 capsule 2   diclofenac (VOLTAREN) 75 MG EC tablet Take 1 tablet (75 mg total) by mouth 2 (two) times daily. (Patient not taking: Reported on 12/22/2021) 30 tablet 0   scopolamine (TRANSDERM-SCOP) 1 MG/3DAYS Place 1 patch (1.5 mg total) onto the skin every 3 (three) days. (Patient not taking: Reported on 12/22/2021) 10 patch 12   TURMERIC PO Take by mouth. (Patient not taking: Reported on 12/22/2021)     No current facility-administered medications on file prior to visit.   No Known Allergies Social History   Socioeconomic History   Marital status: Married    Spouse name: Not on file   Number of children: 2   Years of education: Not on file   Highest education level: Not on file  Occupational History   Not on file  Tobacco Use   Smoking status: Never   Smokeless tobacco: Never  Vaping Use   Vaping Use: Never used  Substance and Sexual Activity   Alcohol use: No   Drug  use: No   Sexual activity: Not Currently    Birth control/protection: Surgical  Other Topics Concern   Not on file  Social History Narrative   Not on file   Social Determinants of Health   Financial Resource Strain: Low Risk  (11/28/2020)   Overall Financial Resource Strain (CARDIA)    Difficulty of Paying Living Expenses: Not hard at all  Food Insecurity: No Food Insecurity (11/28/2020)   Hunger Vital Sign    Worried About Running Out of Food in the Last Year: Never true    Ran Out of Food in the Last Year: Never true  Transportation Needs: No Transportation Needs (11/28/2020)   PRAPARE - Administrator, Civil Service (Medical): No    Lack of Transportation (Non-Medical): No  Physical Activity: Sufficiently Active (11/28/2020)   Exercise Vital Sign    Days of Exercise per Week: 7 days    Minutes of Exercise per Session: 30 min  Stress: No Stress Concern Present (11/28/2020)   [employer] of Occupational Health - Occupational Stress Questionnaire    Feeling of Stress : Not at all  Social Connections: Moderately Isolated (11/28/2020)   Social Connection and Isolation Panel [NHANES]    Frequency of Communication with Friends and Family: More than three times a week    Frequency of Social Gatherings with Friends and Family: More than three times a week    Attends Religious Services: Never    Database administrator or Organizations: No    Attends Banker Meetings: Never    Marital Status: Married  Catering manager Violence: Not At Risk (11/28/2020)   Humiliation, Afraid, Rape, and Kick questionnaire    Fear of Current or Ex-Partner: No    Emotionally Abused: No    Physically Abused: No    Sexually Abused: No     Review of Systems  All other systems reviewed and are negative.      Objective:   Physical Exam Constitutional:      General: She is  not in acute distress.    Appearance: Normal appearance. She is well-developed and normal weight. She  is not ill-appearing or toxic-appearing.  HENT:     Right Ear: Tympanic membrane and ear canal normal.     Left Ear: Tympanic membrane and ear canal normal.     Mouth/Throat:     Pharynx: No oropharyngeal exudate or posterior oropharyngeal erythema.  Cardiovascular:     Rate and Rhythm: Normal rate and regular rhythm.     Heart sounds: Normal heart sounds.  Pulmonary:     Effort: Pulmonary effort is normal. No respiratory distress.     Breath sounds: No wheezing or rales.  Musculoskeletal:     Cervical back: Neck supple.  Lymphadenopathy:     Cervical: No cervical adenopathy.  Neurological:     Mental Status: She is alert and oriented to person, place, and time.     Cranial Nerves: No cranial nerve deficit, dysarthria or facial asymmetry.     Sensory: No sensory deficit.     Motor: No weakness, tremor, atrophy, abnormal muscle tone or seizure activity.     Coordination: Romberg sign negative. Coordination normal. Finger-Nose-Finger Test normal.     Gait: Gait normal.     Deep Tendon Reflexes: Reflexes normal.  Psychiatric:        Mood and Affect: Mood normal.        Behavior: Behavior normal.        Thought Content: Thought content normal.        Judgment: Judgment normal.           Assessment & Plan:  Abnormal brain MRI Patient presented today to discuss the MRI findings.  I reassured the patient that I still feel her symptoms are due to vertigo.  I reassured her that her MRI was relatively normal for age.  I would recommend increasing her Lipitor to 40 mg a day in an effort to try to drop her LDL cholesterol below 100 to reduce the potential progression of microvascular ischemic changes in the brain.  Patient is willing to do this and we will recheck her cholesterol in 3 months.

## 2021-12-30 DIAGNOSIS — I87392 Chronic venous hypertension (idiopathic) with other complications of left lower extremity: Secondary | ICD-10-CM | POA: Diagnosis not present

## 2021-12-30 DIAGNOSIS — I83892 Varicose veins of left lower extremities with other complications: Secondary | ICD-10-CM | POA: Diagnosis not present

## 2022-01-14 DIAGNOSIS — I83891 Varicose veins of right lower extremities with other complications: Secondary | ICD-10-CM | POA: Diagnosis not present

## 2022-01-21 ENCOUNTER — Other Ambulatory Visit: Payer: Self-pay | Admitting: Family Medicine

## 2022-01-21 NOTE — Telephone Encounter (Signed)
Requested medication (s) are due for refill today: yes  Requested medication (s) are on the active medication list: yes  Last refill:  02/27/21 #4 with 11 RF  Future visit scheduled: no  Notes to clinic:  Failed protocol of labs within 12 months, No vitamin D, Mg or Phos on record, labs ordered 11/21/21 but not drawn, no upcoming appt, please assess.       Requested Prescriptions  Pending Prescriptions Disp Refills   alendronate (FOSAMAX) 70 MG tablet [Pharmacy Med Name: ALENDRONATE SODIUM 70 MG TAB] 12 tablet 3    Sig: TAKE 1 TABLET BY MOUTH EVERY 7 DAYS. TAKE WITH A FULL GLASS OF WATER ON AN EMPTY STOMACH.     Endocrinology:  Bisphosphonates Failed - 01/21/2022  2:23 AM      Failed - Vitamin D in normal range and within 360 days    No results found for: "YZ7096KR8", "VK1840RF5", "VD125OH2TOT", "25OHVITD3", "25OHVITD2", "25OHVITD1", "VD25OH"       Failed - Mg Level in normal range and within 360 days    No results found for: "MG"       Failed - Phosphate in normal range and within 360 days    No results found for: "PHOS"       Passed - Ca in normal range and within 360 days    Calcium  Date Value Ref Range Status  02/03/2021 9.8 8.6 - 10.4 mg/dL Final         Passed - Cr in normal range and within 360 days    Creat  Date Value Ref Range Status  02/03/2021 0.72 0.50 - 1.05 mg/dL Final         Passed - eGFR is 30 or above and within 360 days    GFR, Est African American  Date Value Ref Range Status  06/17/2020 93 > OR = 60 mL/min/1.62m Final   GFR, Est Non African American  Date Value Ref Range Status  06/17/2020 80 > OR = 60 mL/min/1.732mFinal   GFR, Estimated  Date Value Ref Range Status  12/21/2020 >60 >60 mL/min Final    Comment:    (NOTE) Calculated using the CKD-EPI Creatinine Equation (2021)    eGFR  Date Value Ref Range Status  02/03/2021 92 > OR = 60 mL/min/1.7323minal    Comment:    The eGFR is based on the CKD-EPI 2021 equation. To calculate   the new eGFR from a previous Creatinine or Cystatin C result, go to https://www.kidney.org/professionals/ kdoqi/gfr%5Fcalculator          Passed - Valid encounter within last 12 months    Recent Outpatient Visits           2 months ago [facility]   3 months ago Acute otalgia, right   [facility]   5 months ago Pure hypercholesterolemia   [facility]   6 months ago Seborrheic keratoses   [facility]   10 months ago Chronic left-sided low back pain with left-sided sciatica   [facility]              Passed - Bone Mineral Density or Dexa Scan completed in the last 2 years

## 2022-01-25 ENCOUNTER — Telehealth: Payer: Self-pay | Admitting: Physical Medicine and Rehabilitation

## 2022-01-25 NOTE — Telephone Encounter (Signed)
Pt called and states that she would like to repeat Left L4-L5 interlaminar epidural steroid injection. Injection took pain completely away.   Cb [phone]

## 2022-01-29 DIAGNOSIS — I87391 Chronic venous hypertension (idiopathic) with other complications of right lower extremity: Secondary | ICD-10-CM | POA: Diagnosis not present

## 2022-01-29 DIAGNOSIS — H8112 Benign paroxysmal vertigo, left ear: Secondary | ICD-10-CM | POA: Diagnosis not present

## 2022-01-29 DIAGNOSIS — R1314 Dysphagia, pharyngoesophageal phase: Secondary | ICD-10-CM | POA: Insufficient documentation

## 2022-01-29 DIAGNOSIS — K219 Gastro-esophageal reflux disease without esophagitis: Secondary | ICD-10-CM | POA: Insufficient documentation

## 2022-01-29 DIAGNOSIS — I83891 Varicose veins of right lower extremities with other complications: Secondary | ICD-10-CM | POA: Diagnosis not present

## 2022-02-02 ENCOUNTER — Other Ambulatory Visit (HOSPITAL_COMMUNITY): Payer: Self-pay | Admitting: Otolaryngology

## 2022-02-02 ENCOUNTER — Other Ambulatory Visit: Payer: Self-pay | Admitting: Otolaryngology

## 2022-02-02 DIAGNOSIS — R1314 Dysphagia, pharyngoesophageal phase: Secondary | ICD-10-CM

## 2022-02-02 DIAGNOSIS — K219 Gastro-esophageal reflux disease without esophagitis: Secondary | ICD-10-CM

## 2022-02-02 DIAGNOSIS — H8112 Benign paroxysmal vertigo, left ear: Secondary | ICD-10-CM | POA: Diagnosis not present

## 2022-02-10 ENCOUNTER — Encounter: Payer: Self-pay | Admitting: Physical Medicine and Rehabilitation

## 2022-02-10 ENCOUNTER — Ambulatory Visit: Payer: Medicare HMO | Admitting: Physical Medicine and Rehabilitation

## 2022-02-10 ENCOUNTER — Ambulatory Visit: Payer: Self-pay

## 2022-02-10 VITALS — BP 128/74 | HR 88

## 2022-02-10 DIAGNOSIS — M5416 Radiculopathy, lumbar region: Secondary | ICD-10-CM

## 2022-02-10 MED ORDER — METHYLPREDNISOLONE ACETATE 80 MG/ML IJ SUSP
40.0000 mg | Freq: Once | INTRAMUSCULAR | Status: AC
  Administered 2022-02-10: 40 mg

## 2022-02-10 NOTE — Patient Instructions (Signed)

## 2022-02-10 NOTE — Progress Notes (Signed)
Pt state lower back pain the travels down her left leg and ankle. Pt state walking and standing makes the pain worse. Pt state laying down and sitting makes th epain worse. Pt state she takes pain meds to help ease her pain.  Numeric Pain Rating Scale and Functional Assessment Average Pain 7   In the last MONTH (on 0-10 scale) has pain interfered with the following?  1. General activity like being  able to carry out your everyday physical activities such as walking, climbing stairs, carrying groceries, or moving a chair?  Rating(9)   +Driver, -BT, -Dye Allergies.

## 2022-02-12 NOTE — Progress Notes (Signed)
Taylor Singleton - 66 y.o. female MRN [id number]  Date of birth: 03-15-1956  Office Visit Note: Visit Date: 02/10/2022 PCP: Donita Brooks, MD Referred by: Donita Brooks, MD  Subjective: Chief Complaint  Patient presents with   Lower Back - Pain   Left Leg - Pain   HPI:  Taylor Singleton is a 66 y.o. female who comes in today for planned repeat Left L4-5  Lumbar Interlaminar epidural steroid injection with fluoroscopic guidance.  The patient has failed conservative care including home exercise, medications, time and activity modification.  This injection will be diagnostic and hopefully therapeutic.  Please see requesting physician notes for further details and justification. Patient received more than 50% pain relief from prior injection.   Referring: Ellin Goodie, FNP   ROS Otherwise per HPI.  Assessment & Plan: Visit Diagnoses:    ICD-10-CM   1. Lumbar radiculopathy  M54.16 XR C-ARM NO REPORT    Epidural Steroid injection    methylPREDNISolone acetate (DEPO-MEDROL) injection 40 mg      Plan: No additional findings.   Meds & Orders:  Meds ordered this encounter  Medications   methylPREDNISolone acetate (DEPO-MEDROL) injection 40 mg    Orders Placed This Encounter  Procedures   XR C-ARM NO REPORT   Epidural Steroid injection    Follow-up: Return for visit to requesting provider as needed.   Procedures: No procedures performed  Lumbar Epidural Steroid Injection - Interlaminar Approach with Fluoroscopic Guidance  Patient: Taylor Singleton      Date of Birth: 12/16/1955 MRN: [id number] PCP: Donita Brooks, MD      Visit Date: 02/10/2022   Universal Protocol:     Consent Given By: the patient  Position: PRONE  Additional Comments: Vital signs were monitored before and after the procedure. Patient was prepped and draped in the usual sterile fashion. The correct patient, procedure, and site was verified.   Injection Procedure Details:   Procedure  diagnoses: Lumbar radiculopathy [M54.16]   Meds Administered:  Meds ordered this encounter  Medications   methylPREDNISolone acetate (DEPO-MEDROL) injection 40 mg     Laterality: Left  Location/Site:  L4-5  Needle: 3.5 in., 20 ga. Tuohy  Needle Placement: Paramedian epidural  Findings:   -Comments: Excellent flow of contrast into the epidural space.  Procedure Details: Using a paramedian approach from the side mentioned above, the region overlying the inferior lamina was localized under fluoroscopic visualization and the soft tissues overlying this structure were infiltrated with 4 ml. of 1% Lidocaine without Epinephrine. The Tuohy needle was inserted into the epidural space using a paramedian approach.   The epidural space was localized using loss of resistance along with counter oblique bi-planar fluoroscopic views.  After negative aspirate for air, blood, and CSF, a 2 ml. volume of Isovue-250 was injected into the epidural space and the flow of contrast was observed. Radiographs were obtained for documentation purposes.    The injectate was administered into the level noted above.   Additional Comments:  The patient tolerated the procedure well Dressing: 2 x 2 sterile gauze and Band-Aid    Post-procedure details: Patient was observed during the procedure. Post-procedure instructions were reviewed.  Patient left the clinic in stable condition.   Clinical History: IMPRESSION: MR THORACIC SPINE IMPRESSION   1. Flowing anterior osteophytes with partial fusion of the T5 through T7 vertebral bodies. 2. Trace anterolisthesis of T2 on T3, T3 on T4, and T4 on T5. 3. Otherwise, mild degenerative changes as detailed above without  high-grade spinal canal or neural foraminal stenosis.   MR LUMBAR SPINE IMPRESSION   1. Degenerative changes in the lumbar spine are most advanced at L4-L5 where there is mild-to-moderate spinal canal stenosis with crowding of the subarticular  zones with possible mass effect on the traversing right L5 nerve root, and moderate to severe right and mild left neural foraminal stenosis. 2. Mild crowding of the left subarticular zone and mild-to-moderate left neural foraminal stenosis at T12-L1. No evidence of nerve root impingement 3. Crowding of the left subarticular zone without definite evidence of nerve root impingement and mild left worse than right neural foraminal stenosis at L3-L4. 4. Multilevel facet arthropathy, most advanced at L4-L5 and L5-S1, slightly worse on the left. Mild enlargement of the common bile duct measuring up to 1.1 cm. Correlate with LFTs, and consider dedicated MRCP as indicated.     Electronically Signed   By: Lesia Hausen M.D.   On: 04/28/2021 14:53     Objective:  VS:  HT:    WT:   BMI:     BP:128/74  HR:88bpm  TEMP: ( )  RESP:  Physical Exam Vitals and nursing note reviewed.  Constitutional:      General: She is not in acute distress.    Appearance: Normal appearance. She is not ill-appearing.  HENT:     Head: Normocephalic and atraumatic.     Right Ear: External ear normal.     Left Ear: External ear normal.  Eyes:     Extraocular Movements: Extraocular movements intact.  Cardiovascular:     Rate and Rhythm: Normal rate.     Pulses: Normal pulses.  Pulmonary:     Effort: Pulmonary effort is normal. No respiratory distress.  Abdominal:     General: There is no distension.     Palpations: Abdomen is soft.  Musculoskeletal:        General: Tenderness present.     Cervical back: Neck supple.     Right lower leg: No edema.     Left lower leg: No edema.     Comments: Patient has good distal strength with no pain over the greater trochanters.  No clonus or focal weakness.  Skin:    Findings: No erythema, lesion or rash.  Neurological:     General: No focal deficit present.     Mental Status: She is alert and oriented to person, place, and time.     Sensory: No sensory deficit.      Motor: No weakness or abnormal muscle tone.     Coordination: Coordination normal.  Psychiatric:        Mood and Affect: Mood normal.        Behavior: Behavior normal.      Imaging: No results found.

## 2022-02-12 NOTE — Procedures (Signed)
Lumbar Epidural Steroid Injection - Interlaminar Approach with Fluoroscopic Guidance  Patient: Taylor Singleton      Date of Birth: Apr 25, 1956 MRN: [id number] PCP: Donita Brooks, MD      Visit Date: 02/10/2022   Universal Protocol:     Consent Given By: the patient  Position: PRONE  Additional Comments: Vital signs were monitored before and after the procedure. Patient was prepped and draped in the usual sterile fashion. The correct patient, procedure, and site was verified.   Injection Procedure Details:   Procedure diagnoses: Lumbar radiculopathy [M54.16]   Meds Administered:  Meds ordered this encounter  Medications   methylPREDNISolone acetate (DEPO-MEDROL) injection 40 mg     Laterality: Left  Location/Site:  L4-5  Needle: 3.5 in., 20 ga. Tuohy  Needle Placement: Paramedian epidural  Findings:   -Comments: Excellent flow of contrast into the epidural space.  Procedure Details: Using a paramedian approach from the side mentioned above, the region overlying the inferior lamina was localized under fluoroscopic visualization and the soft tissues overlying this structure were infiltrated with 4 ml. of 1% Lidocaine without Epinephrine. The Tuohy needle was inserted into the epidural space using a paramedian approach.   The epidural space was localized using loss of resistance along with counter oblique bi-planar fluoroscopic views.  After negative aspirate for air, blood, and CSF, a 2 ml. volume of Isovue-250 was injected into the epidural space and the flow of contrast was observed. Radiographs were obtained for documentation purposes.    The injectate was administered into the level noted above.   Additional Comments:  The patient tolerated the procedure well Dressing: 2 x 2 sterile gauze and Band-Aid    Post-procedure details: Patient was observed during the procedure. Post-procedure instructions were reviewed.  Patient left the clinic in stable condition.

## 2022-02-16 ENCOUNTER — Other Ambulatory Visit (HOSPITAL_COMMUNITY): Payer: Medicare HMO

## 2022-02-17 ENCOUNTER — Other Ambulatory Visit: Payer: Self-pay

## 2022-03-09 ENCOUNTER — Ambulatory Visit (HOSPITAL_COMMUNITY)
Admission: RE | Admit: 2022-03-09 | Discharge: 2022-03-09 | Disposition: A | Discharge status: Home / Self Care | Payer: Medicare HMO | Source: Ambulatory Visit | Attending: Otolaryngology | Admitting: Otolaryngology

## 2022-03-09 DIAGNOSIS — K2289 Other specified disease of esophagus: Secondary | ICD-10-CM | POA: Diagnosis not present

## 2022-03-09 DIAGNOSIS — R1314 Dysphagia, pharyngoesophageal phase: Secondary | ICD-10-CM | POA: Insufficient documentation

## 2022-03-09 DIAGNOSIS — K219 Gastro-esophageal reflux disease without esophagitis: Secondary | ICD-10-CM | POA: Diagnosis not present

## 2022-03-09 DIAGNOSIS — H8112 Benign paroxysmal vertigo, left ear: Secondary | ICD-10-CM | POA: Insufficient documentation

## 2022-03-29 ENCOUNTER — Ambulatory Visit (INDEPENDENT_AMBULATORY_CARE_PROVIDER_SITE_OTHER): Payer: Medicare HMO | Admitting: Family Medicine

## 2022-03-29 VITALS — BP 148/82 | HR 88 | Temp 97.8°F | Resp 16 | Wt 166.0 lb

## 2022-03-29 DIAGNOSIS — K224 Dyskinesia of esophagus: Secondary | ICD-10-CM | POA: Diagnosis not present

## 2022-03-29 DIAGNOSIS — K219 Gastro-esophageal reflux disease without esophagitis: Secondary | ICD-10-CM

## 2022-03-29 MED ORDER — PANTOPRAZOLE SODIUM 40 MG PO TBEC: 40.0000 mg | DELAYED_RELEASE_TABLET | Freq: Every day | ORAL | 3 refills | Status: DC

## 2022-03-29 NOTE — Progress Notes (Signed)
Subjective:    Patient ID: Taylor Singleton, female    DOB: 03-30-1956, 66 y.o.   MRN: [id number] Patient recently had a barium swallow with ENT due to dysphagia: IMPRESSION: 1.  Moderate esophageal dysmotility.   2.  Mild presbyesophagus.   3.  No definite evidence of esophageal mass or stricture.   She is here today reporting nausea in the morning.  She occasionally feels like she has to throw up.  In the morning she has indigestion and heartburn.  She continues to have trouble swallowing.  She denies any melena or hematochezia.  She does have some epigastric abdominal pain Past Medical History:  Diagnosis Date   Arthritis    HLD (hyperlipidemia)    Osteoporosis    Past Surgical History:  Procedure Laterality Date   ABDOMINAL HYSTERECTOMY     due to bleeding TAH/BSO   BREAST CYST EXCISION Left    Current Outpatient Medications on File Prior to Visit  Medication Sig Dispense Refill   alendronate (FOSAMAX) 70 MG tablet TAKE 1 TABLET BY MOUTH EVERY 7 DAYS. TAKE WITH A FULL GLASS OF WATER ON AN EMPTY STOMACH. 12 tablet 3   atorvastatin (LIPITOR) 40 MG tablet Take 1 tablet (40 mg total) by mouth daily. 90 tablet 3   Calcium Carb-Cholecalciferol (CALCIUM/VITAMIN D) 600-400 MG-UNIT TABS Take 2 tablets by mouth daily.     Omega-3 Fatty Acids (FISH OIL OMEGA-3 PO) Take by mouth.     No current facility-administered medications on file prior to visit.   No Known Allergies Social History   Socioeconomic History   Marital status: Married    Spouse name: Not on file   Number of children: 2   Years of education: Not on file   Highest education level: Not on file  Occupational History   Not on file  Tobacco Use   Smoking status: Never   Smokeless tobacco: Never  Vaping Use   Vaping Use: Never used  Substance and Sexual Activity   Alcohol use: No   Drug use: No   Sexual activity: Not Currently    Birth control/protection: Surgical  Other Topics Concern   Not on file  Social  History Narrative   Not on file   Social Determinants of Health   Financial Resource Strain: Low Risk  (11/28/2020)   Overall Financial Resource Strain (CARDIA)    Difficulty of Paying Living Expenses: Not hard at all  Food Insecurity: No Food Insecurity (11/28/2020)   Hunger Vital Sign    Worried About Running Out of Food in the Last Year: Never true    Ran Out of Food in the Last Year: Never true  Transportation Needs: No Transportation Needs (11/28/2020)   PRAPARE - Administrator, Civil Service (Medical): No    Lack of Transportation (Non-Medical): No  Physical Activity: Sufficiently Active (11/28/2020)   Exercise Vital Sign    Days of Exercise per Week: 7 days    Minutes of Exercise per Session: 30 min  Stress: No Stress Concern Present (11/28/2020)   [employer] of Occupational Health - Occupational Stress Questionnaire    Feeling of Stress : Not at all  Social Connections: Moderately Isolated (11/28/2020)   Social Connection and Isolation Panel [NHANES]    Frequency of Communication with Friends and Family: More than three times a week    Frequency of Social Gatherings with Friends and Family: More than three times a week    Attends Religious Services: Never  Active Member of Clubs or Organizations: No    Attends Archivist Meetings: Never    Marital Status: Married  Human resources officer Violence: Not At Risk (11/28/2020)   Humiliation, Afraid, Rape, and Kick questionnaire    Fear of Current or Ex-Partner: No    Emotionally Abused: No    Physically Abused: No    Sexually Abused: No     Review of Systems  All other systems reviewed and are negative.      Objective:   Physical Exam Constitutional:      General: She is not in acute distress.    Appearance: Normal appearance. She is well-developed and normal weight. She is not ill-appearing or toxic-appearing.  HENT:     Right Ear: Tympanic membrane and ear canal normal.     Left Ear: Tympanic  membrane and ear canal normal.     Mouth/Throat:     Pharynx: No oropharyngeal exudate or posterior oropharyngeal erythema.  Cardiovascular:     Rate and Rhythm: Normal rate and regular rhythm.     Heart sounds: Normal heart sounds.  Pulmonary:     Effort: Pulmonary effort is normal. No respiratory distress.     Breath sounds: No wheezing or rales.  Musculoskeletal:     Cervical back: Neck supple.  Lymphadenopathy:     Cervical: No cervical adenopathy.  Neurological:     Mental Status: She is alert and oriented to person, place, and time.     Cranial Nerves: No cranial nerve deficit, dysarthria or facial asymmetry.     Sensory: No sensory deficit.     Motor: No weakness, tremor, atrophy, abnormal muscle tone or seizure activity.     Coordination: Romberg sign negative. Coordination normal. Finger-Nose-Finger Test normal.     Gait: Gait normal.     Deep Tendon Reflexes: Reflexes normal.  Psychiatric:        Mood and Affect: Mood normal.        Behavior: Behavior normal.        Thought Content: Thought content normal.        Judgment: Judgment normal.           Assessment & Plan:  Esophageal dysmotilities  Gastroesophageal reflux disease, unspecified whether esophagitis present I believe this may be related to irritation secondary to acid reflux.  I recommended starting the patient on Protonix 40 mg daily then reassessing in 3 to 4 weeks.  If improving I would recommend taking it for 2 months and then temporarily discontinuing the medication to see if the symptoms return.  Also recommended temporarily discontinuing Fosamax until we have a handle on what is causing her problem

## 2022-04-16 ENCOUNTER — Ambulatory Visit: Payer: Medicare HMO | Admitting: Obstetrics & Gynecology

## 2022-04-16 ENCOUNTER — Encounter: Payer: Self-pay | Admitting: Obstetrics & Gynecology

## 2022-04-16 VITALS — BP 123/71 | HR 82 | Ht 61.0 in | Wt 169.0 lb

## 2022-04-16 DIAGNOSIS — N816 Rectocele: Secondary | ICD-10-CM

## 2022-04-16 NOTE — Progress Notes (Signed)
   GYN VISIT Patient name: Taylor Singleton MRN [id number]  Date of birth: [birth year] Chief Complaint:   Follow-up (Follow up prolapse)  History of Present Illness:   Taylor Singleton is a 66 y.o. G2P2002 PM, [place] female being seen today for the following concerns:   POP: Pt last seen March 2022 for a similar issue.  At that time, she desired to monitor her symptoms.  Today she notes that this continues to be an ongoing issue and desires to proceed with management.   To review, notes frequency of bathroom use both urinating and BMs.  Will have a "normal/large BM" feel better for 2 hours or so then back to frequency bathroom usage.  Denies fecal incontinence.  Denies urinary incontinence.  +Splinting to have a regular BM  Not sexually active.  Denies vaginal itching, discharge or irritation.  No LMP recorded. Patient has had a hysterectomy.     02/03/2021    9:20 AM 11/28/2020    8:30 AM 08/25/2020   10:39 AM 07/10/2020    2:19 PM 09/26/2017    9:30 AM  Depression screen PHQ 2/9  Decreased Interest 0 0 0 0 0  Down, Depressed, Hopeless 0 0 0 0 0  PHQ - 2 Score 0 0 0 0 0  Altered sleeping   0    Tired, decreased energy   0    Change in appetite   0    Feeling bad or failure about yourself    0    Trouble concentrating   0    Moving slowly or fidgety/restless   0    Suicidal thoughts   0    PHQ-9 Score   0       Review of Systems:   Pertinent items are noted in HPI Denies fever/chills, dizziness, headaches, visual disturbances, fatigue, shortness of breath, chest pain, abdominal pain, vomiting.  Pertinent History Reviewed:  Reviewed past medical,surgical, social, obstetrical and family history.  Reviewed problem list, medications and allergies. Physical Assessment:   Vitals:   04/16/22 0847  BP: 123/71  Pulse: 82  Weight: 169 lb (76.7 kg)  Height: 5\' 1"  (1.549 m)  Body mass index is 31.93 kg/m.       Physical Examination:   General appearance: alert, well appearing, and in no  distress  Psych: mood appropriate, normal affect  Skin: warm & dry   Cardiovascular: normal heart rate noted  Respiratory: normal respiratory effort, no distress  Abdomen: soft, non-tender, no rebound, no guarding  Pelvic: visibile rectocele at introitus, normal external genital, normal pink vaginal mucosa, uteurs and cervix surgically absent, apex well supported, cystocele als noted Extremities: no edema   Chaperone:  pt decline     Pessary fitting attempted- pt noted considerable discomfort.  Fitting for smaller ring, which fell out with void.  Assessment & Plan:  1) Pelvic organ prolapse- rectocele -reviewed conservative options, failed initial pessary trial -pt desires surgical intervention -referral created to Dr. Wannetta Sender  Orders Placed This Encounter  Procedures   Ambulatory referral to Urogynecology    Return for TBD.   Janyth Pupa, DO Attending [place], [facility] for [employer], [place]

## 2022-04-20 ENCOUNTER — Encounter: Payer: Self-pay | Admitting: Podiatry

## 2022-04-20 ENCOUNTER — Ambulatory Visit: Payer: Medicare HMO | Admitting: Podiatry

## 2022-04-20 DIAGNOSIS — L600 Ingrowing nail: Secondary | ICD-10-CM

## 2022-04-20 MED ORDER — NEOMYCIN-POLYMYXIN-HC 1 % OT SOLN: OTIC | 1 refills | Status: DC

## 2022-04-20 NOTE — Progress Notes (Signed)
  Subjective:  Patient ID: Taylor Singleton, female    DOB: [birth year],  MRN: [id number] HPI Chief Complaint  Patient presents with   Toe Pain    Hallux right - lateral border, tender x few weeks, been picking at it, going on a trip and didn't want a problem, would like to ask about toenail trimmings   New Patient (Initial Visit)    Est pt 2019    66 y.o. female presents with the above complaint.   ROS: Denies fever chills nausea vomiting muscle aches pains calf pain back pain chest pain shortness of breath.  Past Medical History:  Diagnosis Date   Arthritis    HLD (hyperlipidemia)    Osteoporosis    Past Surgical History:  Procedure Laterality Date   ABDOMINAL HYSTERECTOMY     due to bleeding TAH/BSO   BREAST CYST EXCISION Left     Current Outpatient Medications:    NEOMYCIN-POLYMYXIN-HYDROCORTISONE (CORTISPORIN) 1 % SOLN OTIC solution, Apply 1-2 drops to toe BID after soaking, Disp: 10 mL, Rfl: 1   alendronate (FOSAMAX) 70 MG tablet, TAKE 1 TABLET BY MOUTH EVERY 7 DAYS. TAKE WITH A FULL GLASS OF WATER ON AN EMPTY STOMACH. (Patient not taking: Reported on 04/16/2022), Disp: 12 tablet, Rfl: 3   atorvastatin (LIPITOR) 40 MG tablet, Take 1 tablet (40 mg total) by mouth daily., Disp: 90 tablet, Rfl: 3   Calcium Carb-Cholecalciferol (CALCIUM/VITAMIN D) 600-400 MG-UNIT TABS, Take 2 tablets by mouth daily., Disp: , Rfl:    Omega-3 Fatty Acids (FISH OIL OMEGA-3 PO), Take by mouth., Disp: , Rfl:    pantoprazole (PROTONIX) 40 MG tablet, Take 1 tablet (40 mg total) by mouth daily., Disp: 30 tablet, Rfl: 3  No Known Allergies Review of Systems Objective:  There were no vitals filed for this visit.  General: Well developed, nourished, in no acute distress, alert and oriented x3   Dermatological: Skin is warm, dry and supple bilateral. Nails x 10 are well maintained; remaining integument appears unremarkable at this time. There are no open sores, no preulcerative lesions, no rash or signs of  infection present.  Ingrown toenail fibular border hallux right sharp incurvated nail margin painful on palpation with mild to moderate erythema no purulence no malodor  Vascular: Dorsalis Pedis artery and Posterior Tibial artery pedal pulses are 2/4 bilateral with immedate capillary fill time. Pedal hair growth present. No varicosities and no lower extremity edema present bilateral.   Neruologic: Grossly intact via light touch bilateral. Vibratory intact via tuning fork bilateral. Protective threshold with Semmes Wienstein monofilament intact to all pedal sites bilateral. Patellar and Achilles deep tendon reflexes 2+ bilateral. No Babinski or clonus noted bilateral.   Musculoskeletal: No gross boney pedal deformities bilateral. No pain, crepitus, or limitation noted with foot and ankle range of motion bilateral. Muscular strength 5/5 in all groups tested bilateral.  Gait: Unassisted, Nonantalgic.    Radiographs:  None taken  Assessment & Plan:   Assessment: Ingrown nail fibular border hallux right  Plan: Chemical matricectomy fibular border hallux right tolerated procedure well after local anesthetic was administered.  Was given both oral and written home-going structure for the care and soaking of the toe as well as a prescription for Cortisporin Otic to be applied twice daily after soaking.  We will follow-up with her in 2 to 3 weeks to make sure she is healing well.  Questions or concerns she will notify us immediately.     Jidenna Figgs T. [place], [place]

## 2022-04-20 NOTE — Patient Instructions (Signed)

## 2022-05-04 ENCOUNTER — Ambulatory Visit: Payer: Medicare HMO | Admitting: Podiatry

## 2022-05-25 ENCOUNTER — Ambulatory Visit (INDEPENDENT_AMBULATORY_CARE_PROVIDER_SITE_OTHER): Payer: Medicare HMO

## 2022-05-25 VITALS — Ht 61.0 in | Wt 166.0 lb

## 2022-05-25 DIAGNOSIS — Z0000 Encounter for general adult medical examination without abnormal findings: Secondary | ICD-10-CM

## 2022-05-25 NOTE — Patient Instructions (Addendum)
Taylor Singleton , Thank you for taking time to come for your Medicare Wellness Visit. I appreciate your ongoing commitment to your health goals. Please review the following plan we discussed and let me know if I can assist you in the future.   These are the goals we discussed:  Goals      Patient Stated     I would like to continue to walk daily     Patient Stated     Maintain activity level         This is a list of the screening recommended for you and due dates:  Health Maintenance  Topic Date Due   Colon Cancer Screening  06/14/2021   Mammogram  01/10/2022   COVID-19 Vaccine (3 - Pfizer risk series) 06/10/2022*   Zoster (Shingles) Vaccine (1 of 2) 08/24/2022*   Flu Shot  09/12/2022*   Pneumonia Vaccine (1 - PCV) 05/26/2023*   Medicare Annual Wellness Visit  05/26/2023   DEXA scan (bone density measurement)  Completed   Hepatitis C Screening: USPSTF Recommendation to screen - Ages 51-79 yo.  Completed   HPV Vaccine  Aged Out   DTaP/Tdap/Td vaccine  Discontinued  *Topic was postponed. The date shown is not the original due date.    Advanced directives: Please bring a copy of your health care power of attorney and living will to the office to be added to your chart at your convenience.   Conditions/risks identified: Aim for 30 minutes of exercise or brisk walking, 6-8 glasses of water, and 5 servings of fruits and vegetables each day.   Next appointment: Follow up in one year for your annual wellness visit    Preventive Care 65 Years and Older, Female Preventive care refers to lifestyle choices and visits with your health care provider that can promote health and wellness. What does preventive care include? A yearly physical exam. This is also called an annual well check. Dental exams once or twice a year. Routine eye exams. Ask your health care provider how often you should have your eyes checked. Personal lifestyle choices, including: Daily care of your teeth and  gums. Regular physical activity. Eating a healthy diet. Avoiding tobacco and drug use. Limiting alcohol use. Practicing safe sex. Taking low-dose aspirin every day. Taking vitamin and mineral supplements as recommended by your health care provider. What happens during an annual well check? The services and screenings done by your health care provider during your annual well check will depend on your age, overall health, lifestyle risk factors, and family history of disease. Counseling  Your health care provider may ask you questions about your: Alcohol use. Tobacco use. Drug use. Emotional well-being. Home and relationship well-being. Sexual activity. Eating habits. History of falls. Memory and ability to understand (cognition). Work and work Astronomer. Reproductive health. Screening  You may have the following tests or measurements: Height, weight, and BMI. Blood pressure. Lipid and cholesterol levels. These may be checked every 5 years, or more frequently if you are over 21 years old. Skin check. Lung cancer screening. You may have this screening every year starting at age 27 if you have a 30-pack-year history of smoking and currently smoke or have quit within the past 15 years. Fecal occult blood test (FOBT) of the stool. You may have this test every year starting at age 28. Flexible sigmoidoscopy or colonoscopy. You may have a sigmoidoscopy every 5 years or a colonoscopy every 10 years starting at age 35. Hepatitis C blood test.  Hepatitis B blood test. Sexually transmitted disease (STD) testing. Diabetes screening. This is done by checking your blood sugar (glucose) after you have not eaten for a while (fasting). You may have this done every 1-3 years. Bone density scan. This is done to screen for osteoporosis. You may have this done starting at age 38. Mammogram. This may be done every 1-2 years. Talk to your health care provider about how often you should have regular  mammograms. Talk with your health care provider about your test results, treatment options, and if necessary, the need for more tests. Vaccines  Your health care provider may recommend certain vaccines, such as: Influenza vaccine. This is recommended every year. Tetanus, diphtheria, and acellular pertussis (Tdap, Td) vaccine. You may need a Td booster every 10 years. Zoster vaccine. You may need this after age 87. Pneumococcal 13-valent conjugate (PCV13) vaccine. One dose is recommended after age 36. Pneumococcal polysaccharide (PPSV23) vaccine. One dose is recommended after age 63. Talk to your health care provider about which screenings and vaccines you need and how often you need them. This information is not intended to replace advice given to you by your health care provider. Make sure you discuss any questions you have with your health care provider. Document Released: 06/27/2015 Document Revised: 02/18/2016 Document Reviewed: 04/01/2015 Elsevier Interactive Patient Education  2017 [employer] Prevention in the Home Falls can cause injuries. They can happen to people of all ages. There are many things you can do to make your home safe and to help prevent falls. What can I do on the outside of my home? Regularly fix the edges of walkways and driveways and fix any cracks. Remove anything that might make you trip as you walk through a door, such as a raised step or threshold. Trim any bushes or trees on the path to your home. Use bright outdoor lighting. Clear any walking paths of anything that might make someone trip, such as rocks or tools. Regularly check to see if handrails are loose or broken. Make sure that both sides of any steps have handrails. Any raised decks and porches should have guardrails on the edges. Have any leaves, snow, or ice cleared regularly. Use sand or salt on walking paths during winter. Clean up any spills in your garage right away. This includes oil  or grease spills. What can I do in the bathroom? Use night lights. Install grab bars by the toilet and in the tub and shower. Do not use towel bars as grab bars. Use non-skid mats or decals in the tub or shower. If you need to sit down in the shower, use a plastic, non-slip stool. Keep the floor dry. Clean up any water that spills on the floor as soon as it happens. Remove soap buildup in the tub or shower regularly. Attach bath mats securely with double-sided non-slip rug tape. Do not have throw rugs and other things on the floor that can make you trip. What can I do in the bedroom? Use night lights. Make sure that you have a light by your bed that is easy to reach. Do not use any sheets or blankets that are too big for your bed. They should not hang down onto the floor. Have a firm chair that has side arms. You can use this for support while you get dressed. Do not have throw rugs and other things on the floor that can make you trip. What can I do in the kitchen? Clean up  any spills right away. Avoid walking on wet floors. Keep items that you use a lot in easy-to-reach places. If you need to reach something above you, use a strong step stool that has a grab bar. Keep electrical cords out of the way. Do not use floor polish or wax that makes floors slippery. If you must use wax, use non-skid floor wax. Do not have throw rugs and other things on the floor that can make you trip. What can I do with my stairs? Do not leave any items on the stairs. Make sure that there are handrails on both sides of the stairs and use them. Fix handrails that are broken or loose. Make sure that handrails are as long as the stairways. Check any carpeting to make sure that it is firmly attached to the stairs. Fix any carpet that is loose or worn. Avoid having throw rugs at the top or bottom of the stairs. If you do have throw rugs, attach them to the floor with carpet tape. Make sure that you have a light  switch at the top of the stairs and the bottom of the stairs. If you do not have them, ask someone to add them for you. What else can I do to help prevent falls? Wear shoes that: Do not have high heels. Have rubber bottoms. Are comfortable and fit you well. Are closed at the toe. Do not wear sandals. If you use a stepladder: Make sure that it is fully opened. Do not climb a closed stepladder. Make sure that both sides of the stepladder are locked into place. Ask someone to hold it for you, if possible. Clearly mark and make sure that you can see: Any grab bars or handrails. First and last steps. Where the edge of each step is. Use tools that help you move around (mobility aids) if they are needed. These include: Canes. Walkers. Scooters. Crutches. Turn on the lights when you go into a dark area. Replace any light bulbs as soon as they burn out. Set up your furniture so you have a clear path. Avoid moving your furniture around. If any of your floors are uneven, fix them. If there are any pets around you, be aware of where they are. Review your medicines with your doctor. Some medicines can make you feel dizzy. This can increase your chance of falling. Ask your doctor what other things that you can do to help prevent falls. This information is not intended to replace advice given to you by your health care provider. Make sure you discuss any questions you have with your health care provider. Document Released: 03/27/2009 Document Revised: 11/06/2015 Document Reviewed: 07/05/2014 Elsevier Interactive Patient Education  2017 [employer].

## 2022-05-25 NOTE — Progress Notes (Signed)
Subjective:   Taylor Singleton is a 66 y.o. female who presents for Medicare Annual (Subsequent) preventive examination.  I connected with  Taylor Singleton on 05/25/22 by a audio enabled telemedicine application and verified that I am speaking with the correct person using two identifiers.  Patient Location: Home  Provider Location: Office/Clinic  I discussed the limitations of evaluation and management by telemedicine. The patient expressed understanding and agreed to proceed.   Review of Systems     Cardiac Risk Factors include: advanced age (>74men, >53 women);sedentary lifestyle;dyslipidemia     Objective:    Today's Vitals   05/25/22 0900  Weight: 166 lb (75.3 kg)  Height: 5\' 1"  (1.549 m)   Body mass index is 31.37 kg/m.     05/25/2022    9:04 AM 12/21/2020    9:36 PM 11/28/2020    8:27 AM 06/05/2016    5:01 PM  Advanced Directives  Does Patient Have a Medical Advance Directive? Yes No Yes No  Type of Advance Directive   Healthcare Power of Attorney   Does patient want to make changes to medical advance directive? No - Patient declined  No - Patient declined   Copy of Healthcare Power of Attorney in Chart?   No - copy requested   Would patient like information on creating a medical advance directive?  No - Patient declined  No - Patient declined    Current Medications (verified) Outpatient Encounter Medications as of 05/25/2022  Medication Sig   atorvastatin (LIPITOR) 40 MG tablet Take 1 tablet (40 mg total) by mouth daily.   Calcium Carb-Cholecalciferol (CALCIUM/VITAMIN D) 600-400 MG-UNIT TABS Take 2 tablets by mouth daily.   Omega-3 Fatty Acids (FISH OIL OMEGA-3 PO) Take by mouth.   alendronate (FOSAMAX) 70 MG tablet TAKE 1 TABLET BY MOUTH EVERY 7 DAYS. TAKE WITH A FULL GLASS OF WATER ON AN EMPTY STOMACH.   [DISCONTINUED] NEOMYCIN-POLYMYXIN-HYDROCORTISONE (CORTISPORIN) 1 % SOLN OTIC solution Apply 1-2 drops to toe BID after soaking   [DISCONTINUED] pantoprazole  (PROTONIX) 40 MG tablet Take 1 tablet (40 mg total) by mouth daily.   No facility-administered encounter medications on file as of 05/25/2022.    Allergies (verified) Patient has no known allergies.   History: Past Medical History:  Diagnosis Date   Arthritis    HLD (hyperlipidemia)    Osteoporosis    Past Surgical History:  Procedure Laterality Date   ABDOMINAL HYSTERECTOMY     due to bleeding TAH/BSO   BREAST CYST EXCISION Left    Family History  Problem Relation Age of Onset   Diabetes Mother    Heart attack Brother    Social History   Socioeconomic History   Marital status: Married    Spouse name: Not on file   Number of children: 2   Years of education: Not on file   Highest education level: Not on file  Occupational History   Not on file  Tobacco Use   Smoking status: Never   Smokeless tobacco: Never  Vaping Use   Vaping Use: Never used  Substance and Sexual Activity   Alcohol use: No   Drug use: No   Sexual activity: Not Currently    Birth control/protection: Surgical  Other Topics Concern   Not on file  Social History Narrative   Not on file   Social Determinants of Health   Financial Resource Strain: Low Risk  (05/25/2022)   Overall Financial Resource Strain (CARDIA)    Difficulty of Paying Living Expenses:  Not hard at all  Food Insecurity: No Food Insecurity (05/25/2022)   Hunger Vital Sign    Worried About Running Out of Food in the Last Year: Never true    Ran Out of Food in the Last Year: Never true  Transportation Needs: No Transportation Needs (05/25/2022)   PRAPARE - Administrator, Civil Service (Medical): No    Lack of Transportation (Non-Medical): No  Physical Activity: Insufficiently Active (05/25/2022)   Exercise Vital Sign    Days of Exercise per Week: 3 days    Minutes of Exercise per Session: 30 min  Stress: No Stress Concern Present (05/25/2022)   [employer] of Occupational Health - Occupational Stress  Questionnaire    Feeling of Stress : Not at all  Social Connections: Moderately Isolated (05/25/2022)   Social Connection and Isolation Panel [NHANES]    Frequency of Communication with Friends and Family: Twice a week    Frequency of Social Gatherings with Friends and Family: Three times a week    Attends Religious Services: Never    Active Member of Clubs or Organizations: No    Attends Engineer, structural: Never    Marital Status: Married    Tobacco Counseling Counseling given: Not Answered   Clinical Intake:  Pre-visit preparation completed: Yes  Pain : No/denies pain  Diabetes: No  How often do you need to have someone help you when you read instructions, pamphlets, or other written materials from your doctor or pharmacy?: 1 - Never  Diabetic?No   Interpreter Needed?: No  Information entered by :: Kandis Fantasia LPN   Activities of Daily Living    05/25/2022    9:13 AM  In your present state of health, do you have any difficulty performing the following activities:  Hearing? 0  Vision? 0  Difficulty concentrating or making decisions? 0  Walking or climbing stairs? 0  Dressing or bathing? 0  Doing errands, shopping? 0  Preparing Food and eating ? N  Using the Toilet? N  In the past six months, have you accidently leaked urine? N  Do you have problems with loss of bowel control? N  Managing your Medications? N  Managing your Finances? N  Housekeeping or managing your Housekeeping? N    Patient Care Team: Donita Brooks, MD as PCP - General (Family Medicine)  Indicate any recent Medical Services you may have received from other than [facility] providers in the past year (date may be approximate).     Assessment:   This is a routine wellness examination for Taylor Singleton.  Hearing/Vision screen Hearing Screening - Comments:: No concerns noted  Vision Screening - Comments:: Wears rx glasses - up to date with routine eye exams   Dietary issues and  exercise activities discussed: Current Exercise Habits: The patient has a physically strenuous job, but has no regular exercise apart from work.   Goals Addressed             This Visit's Progress    Patient Stated       Maintain activity level       Depression Screen    05/25/2022    9:05 AM 02/03/2021    9:20 AM 11/28/2020    8:30 AM 08/25/2020   10:39 AM 07/10/2020    2:19 PM 09/26/2017    9:30 AM 05/26/2017    3:11 PM  PHQ 2/9 Scores  PHQ - 2 Score 0 0 0 0 0 0 0  PHQ- 9 Score  0       Fall Risk    05/25/2022    9:01 AM 02/03/2021    9:20 AM 11/28/2020    8:29 AM 08/25/2020   10:39 AM 07/10/2020    2:19 PM  Fall Risk   Falls in the past year? 0 0 0 0 0  Number falls in past yr: 0 0 0 0 0  Injury with Fall? 0 0 0 0 0  Risk for fall due to : No Fall Risks No Fall Risks No Fall Risks No Fall Risks   Follow up Falls evaluation completed;Education provided;Falls prevention discussed Falls evaluation completed Falls evaluation completed;Falls prevention discussed  Falls evaluation completed    FALL RISK PREVENTION PERTAINING TO THE HOME:  Any stairs in or around the home? Yes  If so, are there any without handrails? No  Home free of loose throw rugs in walkways, pet beds, electrical cords, etc? Yes  Adequate lighting in your home to reduce risk of falls? Yes   ASSISTIVE DEVICES UTILIZED TO PREVENT FALLS:  Life alert? No  Use of a cane, walker or w/c? No  Grab bars in the bathroom? No  Shower chair or bench in shower? No  Elevated toilet seat or a handicapped toilet? No   TIMED UP AND GO:  Was the test performed? No .  Length of time to ambulate 10 feet: telephonic visit    Cognitive Function:        05/25/2022    9:13 AM  6CIT Screen  What Year? 0 points  What month? 0 points  What time? 0 points  Count back from 20 0 points  Months in reverse 0 points  Repeat phrase 0 points  Total Score 0 points    Immunizations Immunization History   Administered Date(s) Administered   PFIZER(Purple Top)SARS-COV-2 Vaccination 08/07/2019, 08/28/2019    TDAP status: Due, Education has been provided regarding the importance of this vaccine. Advised may receive this vaccine at local pharmacy or Health Dept. Aware to provide a copy of the vaccination record if obtained from local pharmacy or Health Dept. Verbalized acceptance and understanding.  Flu Vaccine status: Declined, Education has been provided regarding the importance of this vaccine but patient still declined. Advised may receive this vaccine at local pharmacy or Health Dept. Aware to provide a copy of the vaccination record if obtained from local pharmacy or Health Dept. Verbalized acceptance and understanding.  Pneumococcal vaccine status: Declined,  Education has been provided regarding the importance of this vaccine but patient still declined. Advised may receive this vaccine at local pharmacy or Health Dept. Aware to provide a copy of the vaccination record if obtained from local pharmacy or Health Dept. Verbalized acceptance and understanding.   Covid-19 vaccine status: Information provided on how to obtain vaccines.   Qualifies for Shingles Vaccine? Yes   Zostavax completed No   Shingrix Completed?: No.    Education has been provided regarding the importance of this vaccine. Patient has been advised to call insurance company to determine out of pocket expense if they have not yet received this vaccine. Advised may also receive vaccine at local pharmacy or Health Dept. Verbalized acceptance and understanding.  Screening Tests Health Maintenance  Topic Date Due   COLONOSCOPY (Pts [id number] Insurance coverage will need to be confirmed)  06/14/2021   MAMMOGRAM  01/10/2022   COVID-19 Vaccine (3 - Pfizer risk series) 06/10/2022 (Originally 09/25/2019)   Zoster Vaccines- Shingrix (1 of 2) 08/24/2022 (Originally 07/31/1974)  INFLUENZA VACCINE  09/12/2022 (Originally 01/12/2022)    Pneumonia Vaccine 68+ Years old (1 - PCV) 05/26/2023 (Originally 07/31/2018)   Medicare Annual Wellness (AWV)  05/26/2023   DEXA SCAN  Completed   Hepatitis C Screening  Completed   HPV VACCINES  Aged Out   DTaP/Tdap/Td  Discontinued    Health Maintenance  Health Maintenance Due  Topic Date Due   COLONOSCOPY (Pts 45-22yrs Insurance coverage will need to be confirmed)  06/14/2021   MAMMOGRAM  01/10/2022    Colorectal cancer screening: Referral to GI placed after discussion with provider. Pt aware the office will call re: appt.  Mammogram status: Ordered (patient would like to hold on scheduling at this time) . Pt provided with contact info and advised to call to schedule appt.   Bone Density status: Completed 02/25/21. Results reflect: Bone density results: OSTEOPOROSIS. Repeat every 2 years.  Lung Cancer Screening: (Low Dose CT Chest recommended if Age 8-80 years, 30 pack-year currently smoking OR have quit w/in 15years.) does not qualify.   Lung Cancer Screening Referral: n/a  Additional Screening:  Hepatitis C Screening: does qualify; Completed 06/02/17  Vision Screening: Recommended annual ophthalmology exams for early detection of glaucoma and other disorders of the eye. Is the patient up to date with their annual eye exam?  Yes  Who is the provider or what is the name of the office in which the patient attends annual eye exams? Unable to provider name  If pt is not established with a provider, would they like to be referred to a provider to establish care? No .   Dental Screening: Recommended annual dental exams for proper oral hygiene  Community Resource Referral / Chronic Care Management: CRR required this visit?  No   CCM required this visit?  No      Plan:     I have personally reviewed and noted the following in the patient's chart:   Medical and social history Use of alcohol, tobacco or illicit drugs  Current medications and supplements including opioid  prescriptions. Patient is not currently taking opioid prescriptions. Functional ability and status Nutritional status Physical activity Advanced directives List of other physicians Hospitalizations, surgeries, and ER visits in previous 12 months Vitals Screenings to include cognitive, depression, and falls Referrals and appointments  In addition, I have reviewed and discussed with patient certain preventive protocols, quality metrics, and best practice recommendations. A written personalized care plan for preventive services as well as general preventive health recommendations were provided to patient.     Durwin Nora, [place]   04/10/2535   Due to this being a virtual visit, the after visit summary with patients personalized plan was offered to patient via mail or my-chart.  Patient would like to access on my-chart  Nurse Notes: No concerns

## 2022-06-22 ENCOUNTER — Other Ambulatory Visit: Payer: Self-pay | Admitting: Family Medicine

## 2022-06-22 DIAGNOSIS — Z1231 Encounter for screening mammogram for malignant neoplasm of breast: Secondary | ICD-10-CM

## 2022-07-02 ENCOUNTER — Encounter: Payer: Self-pay | Admitting: Obstetrics and Gynecology

## 2022-07-02 ENCOUNTER — Ambulatory Visit: Payer: Medicare HMO | Admitting: Obstetrics and Gynecology

## 2022-07-02 VITALS — BP 120/77 | HR 85 | Ht 60.0 in | Wt 163.8 lb

## 2022-07-02 DIAGNOSIS — N3941 Urge incontinence: Secondary | ICD-10-CM | POA: Diagnosis not present

## 2022-07-02 DIAGNOSIS — N393 Stress incontinence (female) (male): Secondary | ICD-10-CM

## 2022-07-02 DIAGNOSIS — N816 Rectocele: Secondary | ICD-10-CM

## 2022-07-02 DIAGNOSIS — K5904 Chronic idiopathic constipation: Secondary | ICD-10-CM | POA: Diagnosis not present

## 2022-07-02 DIAGNOSIS — R350 Frequency of micturition: Secondary | ICD-10-CM

## 2022-07-02 DIAGNOSIS — N993 Prolapse of vaginal vault after hysterectomy: Secondary | ICD-10-CM | POA: Diagnosis not present

## 2022-07-02 LAB — POCT URINALYSIS DIPSTICK
Bilirubin, UA: NEGATIVE
Blood, UA: NEGATIVE
Glucose, UA: NEGATIVE
Ketones, UA: NEGATIVE
Nitrite, UA: NEGATIVE
Protein, UA: POSITIVE — AB
Spec Grav, UA: >=1.03 — AB (ref 1.010–1.025)
Urobilinogen, UA: 1 E.U./dL
pH, UA: 6.5 (ref 5.0–8.0)

## 2022-07-02 NOTE — Progress Notes (Signed)
[facility] New Patient Evaluation and Consultation  Referring Provider: Janyth Pupa, DO PCP: Susy Frizzle, MD Date of Service: 07/02/2022  SUBJECTIVE Chief Complaint: New Patient (Initial Visit) Taylor Singleton is a 67 y.o. female is here for rectal prolapse. Also pt c/o urinary frequency.)  History of Present Illness: Taylor Singleton is a 67 y.o. White or Caucasian female seen in consultation at the request of Dr. Nelda Marseille for evaluation of prolapse.    Review of records significant for: Needs to splint to have a BM. Denies incontinence  Urinary Symptoms: Leaks urine with cough/ sneeze and with urgency Leaks rarely- not every day. Usually only if bladder is full and she waits too long.  Pad use: none She is not bothered by her UI symptoms.  Day time voids 8.  Nocturia: 2 times per night to void. Voiding dysfunction: she empties her bladder well.  does not use a catheter to empty bladder.  When urinating, she feels she has no difficulties  UTIs:  0  UTI's in the last year.   Denies history of blood in urine and kidney or bladder stones  Pelvic Organ Prolapse Symptoms:                  She Admits to a feeling of a bulge the vaginal area. It has been present for several years, has been worksening more recently.  She Admits to seeing a bulge.  This bulge is bothersome. Has tried a pessary with Dr Nelda Marseille and it was very uncomfortable.   Bowel Symptom: Bowel movements: some stool every day but small balls, rarely has a "good" bowel movement.  Stool consistency: hard- [place] 1-2 Straining: yes.  Splinting: yes- has to splint every time.  Incomplete evacuation: yes.  She Denies accidental bowel leakage / fecal incontinence Bowel regimen: none  Sexual Function Sexually active: no.   Pelvic Pain Denies pelvic pain    Past Medical History:  Past Medical History:  Diagnosis Date   Arthritis    HLD (hyperlipidemia)    Osteoporosis    Sciatica      Past  Surgical History:   Past Surgical History:  Procedure Laterality Date   ABDOMINAL HYSTERECTOMY     due to bleeding TAH/BSO   BREAST CYST EXCISION Left      Past OB/GYN History: OB History  Gravida Para Term Preterm AB Living  2 2 2     2   SAB IAB Ectopic Multiple Live Births          2    # Outcome Date GA Lbr Len/2nd Weight Sex Delivery Anes PTL Lv  2 Term           1 Term             Vaginal deliveries: 2,  Forceps/ Vacuum deliveries: 0, Cesarean section: 0 S/p hysterectomy   Medications: She has a current medication list which includes the following prescription(s): atorvastatin, calcium/vitamin d, omega-3 fatty acids, pantoprazole, and alendronate.   Allergies: Patient has No Known Allergies.   Social History:  Social History   Tobacco Use   Smoking status: Never   Smokeless tobacco: Never  Vaping Use   Vaping Use: Never used  Substance Use Topics   Alcohol use: No   Drug use: No    Relationship status: married She lives with husband.   She is employed as a Scientist, water quality. Regular exercise: No History of abuse: No  Family History:   Family History  Problem Relation Age  of Onset   Diabetes Mother    Heart attack Brother      Review of Systems: Review of Systems  Constitutional:  Negative for fever, malaise/fatigue and weight loss.  Respiratory:  Negative for cough, shortness of breath and wheezing.   Cardiovascular:  Positive for leg swelling. Negative for chest pain and palpitations.  Gastrointestinal:  Negative for abdominal pain and blood in stool.  Genitourinary:  Negative for dysuria.  Musculoskeletal:  Negative for myalgias.  Skin:  Negative for rash.  Neurological:  Negative for dizziness and headaches.  Endo/Heme/Allergies:  Does not bruise/bleed easily.       + hot flashes  Psychiatric/Behavioral:  Negative for depression. The patient is not nervous/anxious.      OBJECTIVE Physical Exam: Vitals:   07/02/22 0801  BP: 120/77  Pulse: 85   Weight: 163 lb 12.8 oz (74.3 kg)  Height: 5' (1.524 m)    Physical Exam Constitutional:      General: She is not in acute distress. Pulmonary:     Effort: Pulmonary effort is normal.  Abdominal:     General: There is no distension.     Palpations: Abdomen is soft.     Tenderness: There is no abdominal tenderness. There is no rebound.  Musculoskeletal:        General: No swelling. Normal range of motion.  Skin:    General: Skin is warm and dry.     Findings: No rash.  Neurological:     Mental Status: She is alert and oriented to person, place, and time.  Psychiatric:        Mood and Affect: Mood normal.        Behavior: Behavior normal.      GU / Detailed Urogynecologic Evaluation:  Pelvic Exam: Normal external female genitalia; Bartholin's and Skene's glands normal in appearance; urethral meatus normal in appearance, no urethral masses or discharge.   CST: negative  s/p hysterectomy: Speculum exam reveals normal vaginal mucosa with  atrophy and normal vaginal cuff.  Adnexa no mass, fullness, tenderness.    Pelvic floor strength III/V, puborectalis IV/V external anal sphincter IV/V  Pelvic floor musculature: Right levator non-tender, Right obturator non-tender, Left levator non-tender, Left obturator non-tender  POP-Q:   POP-Q  -2.5                                            Aa   -2.5                                           Ba  -5.5                                              C   5                                            Gh  3.5  Pb  7.5                                            tvl   1.5                                            Ap  1.5                                            Bp                                                 D      Rectal Exam:  Normal sphincter tone, large distal rectocele, enterocoele present, no rectal masses, no sign of dyssynergia when asking the patient to bear  down.  Post-Void Residual (PVR) by Bladder Scan: In order to evaluate bladder emptying, we discussed obtaining a postvoid residual and she agreed to this procedure.  Procedure: The ultrasound unit was placed on the patient's abdomen in the suprapubic region after the patient had voided. A PVR of 16 ml was obtained by bladder scan.  Laboratory Results: POC urine: trace protein, trace leukocytes   ASSESSMENT AND PLAN Ms. Santiago is a 67 y.o. with:  1. Prolapse of posterior vaginal wall   2. Vaginal vault 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   3. SUI (stress urinary incontinence, female)   4. Urge incontinence   5. Urinary frequency   6. Chronic idiopathic constipation    Stage I anterior, Stage III posterior, Stage I apical prolapse - For treatment of pelvic organ prolapse, we discussed options for management including expectant management, conservative management, and surgical management, such as Kegels, a pessary, pelvic floor physical therapy, and specific surgical procedures. - She is interested in surgery. Discussed option of vaginal repair. Will plan for: Exam under anesthesia, Posterior repair, perineorrhaphy, Sacrospinous ligament fixation  2. Incontinence - incontinence very rare, does not feel she needs treatment.   3. Constipation - For constipation, we reviewed the importance of a better bowel regimen.  We also discussed the importance of avoiding chronic straining, as it can exacerbate her pelvic floor symptoms; we discussed treating constipation and straining prior to surgery, as postoperative straining can lead to damage to the repair and recurrence of symptoms.  - We discussed initiating therapy with increasing fluid intake, fiber supplementation, stool softeners, and laxatives such as miralax daily.    Will request surgery scheduling and pt will return for pre op  [name], MD

## 2022-07-02 NOTE — Patient Instructions (Addendum)
Constipation: Our goal is to achieve formed bowel movements daily or every-other-day.  You may need to try different combinations of the following options to find what works best for you - everybody's body works differently so feel free to adjust the dosages as needed.  Some options to help maintain bowel health include:  Dietary changes (more leafy greens, vegetables and fruits; less processed foods) Fiber supplementation (Benefiber, FiberCon, Metamucil or Psyllium). Start slow and increase gradually to full dose. Over-the-counter agents such as: stool softeners (Docusate or Colace) and/or laxatives (Miralax, milk of magnesia)  "Power Pudding" is a natural mixture that may help your constipation.  To make blend 1 cup applesauce, 1 cup wheat bran, and 3/4 cup prune juice, refrigerate and then take 1 tablespoon daily with a large glass of water as needed.  You have a stage 3 (out of 4) prolapse.  We discussed the fact that it is not life threatening but there are several treatment options. For treatment of pelvic organ prolapse, we discussed options for management including expectant management, conservative management, and surgical management, such as Kegels, a pessary, pelvic floor physical therapy, and specific surgical procedures.    

## 2022-07-03 ENCOUNTER — Other Ambulatory Visit: Payer: Self-pay | Admitting: Family Medicine

## 2022-07-06 ENCOUNTER — Ambulatory Visit
Admission: RE | Admit: 2022-07-06 | Discharge: 2022-07-06 | Disposition: A | Discharge status: Home / Self Care | Payer: Medicare HMO | Source: Ambulatory Visit | Attending: Family Medicine | Admitting: Family Medicine

## 2022-07-06 DIAGNOSIS — Z1231 Encounter for screening mammogram for malignant neoplasm of breast: Secondary | ICD-10-CM

## 2022-07-13 ENCOUNTER — Other Ambulatory Visit: Payer: Self-pay

## 2022-07-13 DIAGNOSIS — K219 Gastro-esophageal reflux disease without esophagitis: Secondary | ICD-10-CM

## 2022-07-13 DIAGNOSIS — K224 Dyskinesia of esophagus: Secondary | ICD-10-CM

## 2022-07-13 MED ORDER — PANTOPRAZOLE SODIUM 40 MG PO TBEC: 40.0000 mg | DELAYED_RELEASE_TABLET | Freq: Every day | ORAL | 1 refills | Status: DC

## 2022-07-14 ENCOUNTER — Encounter (HOSPITAL_BASED_OUTPATIENT_CLINIC_OR_DEPARTMENT_OTHER): Payer: Self-pay | Admitting: Obstetrics and Gynecology

## 2022-07-14 NOTE — Progress Notes (Signed)
[facility] Pre-Operative H&P  Subjective Chief Complaint: Taylor Singleton presents for a preoperative encounter.   History of Present Illness: Taylor Singleton is a 67 y.o. female who presents for preoperative visit.  She is scheduled to undergo Exam under anesthesia, Posterior repair, perineorrhaphy, Sacrospinous ligament fixation  on 07/19/2022.  Her symptoms include Prolapse of posterior vaginal wall, prolapse of vaginal vault, chronic constipation and she was was found to have Stage I anterior, Stage III posterior, Stage I apical prolapse.   Urodynamics were deferred by patient  Past Medical History:  Diagnosis Date   Arthritis    HLD (hyperlipidemia)    Osteoporosis    Sciatica      Past Surgical History:  Procedure Laterality Date   ABDOMINAL HYSTERECTOMY     due to bleeding TAH/BSO   BREAST CYST EXCISION Left     has No Known Allergies.   Family History  Problem Relation Age of Onset   Diabetes Mother    Heart attack Brother     Social History   Tobacco Use   Smoking status: Never   Smokeless tobacco: Never  Vaping Use   Vaping Use: Never used  Substance Use Topics   Alcohol use: No   Drug use: No     Review of Systems was negative for a full 10 system review except as noted in the History of Present Illness.   Current Outpatient Medications:    alendronate (FOSAMAX) 70 MG tablet, TAKE 1 TABLET BY MOUTH EVERY 7 DAYS. TAKE WITH A FULL GLASS OF WATER ON AN EMPTY STOMACH. (Patient not taking: Reported on 07/02/2022), Disp: 12 tablet, Rfl: 3   atorvastatin (LIPITOR) 40 MG tablet, Take 1 tablet (40 mg total) by mouth daily., Disp: 90 tablet, Rfl: 3   Calcium Carb-Cholecalciferol (CALCIUM/VITAMIN D) 600-400 MG-UNIT TABS, Take 2 tablets by mouth daily., Disp: , Rfl:    Omega-3 Fatty Acids (FISH OIL OMEGA-3 PO), Take by mouth., Disp: , Rfl:    pantoprazole (PROTONIX) 40 MG tablet, Take 1 tablet (40 mg total) by mouth daily., Disp: 90 tablet, Rfl: 1    Objective There were no vitals filed for this visit.  Gen: NAD CV: S1 S2 RRR Lungs: Clear to auscultation bilaterally Abd: soft, nontender   Previous Pelvic Exam showed:  Normal external female genitalia; Bartholin's and Skene's glands normal in appearance; urethral meatus normal in appearance, no urethral masses or discharge.    CST: negative   s/p hysterectomy: Speculum exam reveals normal vaginal mucosa with  atrophy and normal vaginal cuff.  Adnexa no mass, fullness, tenderness.     Pelvic floor strength III/V, puborectalis IV/V external anal sphincter IV/V   Pelvic floor musculature: Right levator non-tender, Right obturator non-tender, Left levator non-tender, Left obturator non-tender   POP-Q:    POP-Q   -2.5                                            Aa   -2.5                                           Ba   -5.5  C    5                                            Gh   3.5                                            Pb   7.5                                            tvl    1.5                                            Ap   1.5                                            Bp                                                  D          Rectal Exam:  Normal sphincter tone, large distal rectocele, enterocoele present, no rectal masses, no sign of dyssynergia when asking the patient to bear down.    Assessment/ Plan  Assessment: The patient is a 67 y.o. year old scheduled to undergo Exam under anesthesia, Posterior repair, perineorrhaphy, Sacrospinous ligament fixation . Verbal consent was obtained for these procedures.  Plan: General Surgical Consent: The patient has previously been counseled on alternative treatments, and the decision by the patient and provider was to proceed with the procedure listed above.  For all procedures, there are risks of bleeding, infection, damage to surrounding organs including  but not limited to bowel, bladder, blood vessels, ureters and nerves, and need for further surgery if an injury were to occur. These risks are all low with minimally invasive surgery.   There are risks of numbness and weakness at any body site or buttock/rectal pain.  It is possible that baseline pain can be worsened by surgery, either with or without mesh. If surgery is vaginal, there is also a low risk of possible conversion to laparoscopy or open abdominal incision where indicated. Very rare risks include blood transfusion, blood clot, heart attack, pneumonia, or death.   There is also a risk of short-term postoperative urinary retention with need to use a catheter. About half of patients need to go home from surgery with a catheter, which is then later removed in the office. The risk of long-term need for a catheter is very low. There is also a risk of worsening of overactive bladder.     Prolapse (with or without mesh): Risk factors for surgical failure  include things that put pressure on your pelvis and the surgical repair, including obesity, chronic cough, and heavy lifting or straining (including lifting children  or adults, straining on the toilet, or lifting heavy objects such as furniture or anything weighing >25 lbs. Risks of recurrence is 20-30% with vaginal native tissue repair and a less than 10% with sacrocolpopexy with mesh.     We discussed consent for blood products. Risks for blood transfusion include allergic reactions, other reactions that can affect different body organs and managed accordingly, transmission of infectious diseases such as HIV or Hepatitis. However, the blood is screened. Patient consents for blood products.  Pre-operative instructions:  She was instructed to not take Aspirin/NSAIDs x 7days prior to surgery.   Catheter use: Patient will go home with foley if needed after post-operative voiding trial.  Post-operative instructions:  She was provided with specific  post-operative instructions, including precautions and signs/symptoms for which we would recommend contacting us, in addition to daytime and after-hours contact phone numbers. This was provided on a handout.   Post-operative medications: Prescriptions for motrin, tylenol, miralax, and Tramadol were sent to her pharmacy. Discussed using ibuprofen and tylenol on a schedule to limit use of narcotics.   Laboratory testing:  We will check labs as anesthesia prefers.   Preoperative clearance:  She does not require surgical clearance.    Post-operative follow-up:  A post-operative appointment will be made for 6 weeks from the date of surgery. If she needs a post-operative nurse visit for a voiding trial, that will be set up after she leaves the hospital.    Patient will call the clinic or use MyChart should anything change or any new issues arise.   Berton Mount, NP

## 2022-07-14 NOTE — Progress Notes (Signed)
Spoke w/ via phone for pre-op interview--- pt Lab needs dos----  no (per anes)            Lab results------ no COVID test -----patient states asymptomatic no test needed Arrive at -------  1100 on 07-19-2022 NPO after MN NO Solid Food.  Clear liquids from MN until--- 1000 Med rec completed Medications to take morning of surgery ----- protonix, lipitor Diabetic medication ----- n/a Patient instructed no nail polish to be worn day of surgery Patient instructed to bring photo id and insurance card day of surgery Patient aware to have Driver (ride ) / caregiver    for 24 hours after surgery -- husband, Taylor Singleton Patient Special Instructions ----- n/a Pre-Op special Istructions ----- case just added on today, orders pending Patient verbalized understanding of instructions that were given at this phone interview. Patient denies shortness of breath, chest pain, fever, cough at this phone interview.

## 2022-07-16 ENCOUNTER — Ambulatory Visit (INDEPENDENT_AMBULATORY_CARE_PROVIDER_SITE_OTHER): Payer: Medicare HMO | Admitting: Obstetrics and Gynecology

## 2022-07-16 ENCOUNTER — Encounter: Payer: Self-pay | Admitting: Obstetrics and Gynecology

## 2022-07-16 VITALS — BP 134/61 | HR 76 | Wt 164.2 lb

## 2022-07-16 DIAGNOSIS — Z01818 Encounter for other preprocedural examination: Secondary | ICD-10-CM

## 2022-07-16 MED ORDER — TRAMADOL HCL 50 MG PO TABS: 50.0000 mg | ORAL_TABLET | Freq: Three times a day (TID) | ORAL | 0 refills | Status: AC | PRN

## 2022-07-16 MED ORDER — ACETAMINOPHEN 500 MG PO TABS: 500.0000 mg | ORAL_TABLET | Freq: Four times a day (QID) | ORAL | 0 refills | Status: DC | PRN

## 2022-07-16 MED ORDER — IBUPROFEN 600 MG PO TABS: 600.0000 mg | ORAL_TABLET | Freq: Four times a day (QID) | ORAL | 0 refills | Status: DC | PRN

## 2022-07-16 NOTE — H&P (Signed)
[facility] H&P  Subjective Chief Complaint: Cleola Perryman presents for a preoperative encounter.   History of Present Illness: Kameran Lallier is a 67 y.o. female who presents for preoperative visit.  She is scheduled to undergo Exam under anesthesia, Posterior repair, perineorrhaphy, Sacrospinous ligament fixation  on 07/19/2022.  Her symptoms include Prolapse of posterior vaginal wall, prolapse of vaginal vault, chronic constipation and she was was found to have Stage I anterior, Stage III posterior, Stage I apical prolapse.   Urodynamics were deferred by patient  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    Varicose vein of leg    Wears glasses      Past Surgical History:  Procedure Laterality Date   BREAST CYST EXCISION Left    CHOLECYSTECTOMY, LAPAROSCOPIC  2010   TOTAL ABDOMINAL HYSTERECTOMY W/ BILATERAL SALPINGOOPHORECTOMY     1990s    has No Known Allergies.   Family History  Problem Relation Age of Onset   Diabetes Mother    Heart attack Brother     Social History   Tobacco Use   Smoking status: Never   Smokeless tobacco: Never  Vaping Use   Vaping Use: Never used  Substance Use Topics   Alcohol use: No   Drug use: Never     Review of Systems was negative for a full 10 system review except as noted in the History of Present Illness.  No current facility-administered medications for this encounter.  Current Outpatient Medications:    atorvastatin (LIPITOR) 40 MG tablet, Take 1 tablet (40 mg total) by mouth daily. (Patient taking differently: Take 40 mg by mouth daily.), Disp: 90 tablet, Rfl: 3   Calcium Carb-Cholecalciferol (CALCIUM/VITAMIN D) 600-400 MG-UNIT TABS, Take 2 tablets by mouth daily., Disp: ,  Rfl:    Omega-3 Fatty Acids (FISH OIL OMEGA-3 PO), Take by mouth daily., Disp: , Rfl:    pantoprazole (PROTONIX) 40 MG tablet, Take 1 tablet (40 mg total) by mouth daily. (Patient taking differently: Take 40 mg by mouth daily.), Disp: 90 tablet, Rfl: 1   Polyethylene Glycol 3350 (MIRALAX PO), Take by mouth daily., Disp: , Rfl:    acetaminophen (TYLENOL) 500 MG tablet, Take 1 tablet (500 mg total) by mouth every 6 (six) hours as needed (pain)., Disp: 30 tablet, Rfl: 0   alendronate (FOSAMAX) 70 MG tablet, TAKE 1 TABLET BY MOUTH EVERY 7 DAYS. TAKE WITH A FULL GLASS OF WATER ON AN EMPTY STOMACH., Disp: 12 tablet, Rfl: 3   ibuprofen (ADVIL) 600 MG tablet, Take 1 tablet (600 mg total) by mouth every 6 (six) hours as needed., Disp: 30 tablet, Rfl: 0   traMADol (ULTRAM) 50 MG tablet, Take 1 tablet (50 mg total) by mouth every 8 (eight) hours as needed for up to 5 days., Disp: 10 tablet, Rfl: 0   Objective There were no vitals filed for this visit.  Gen: NAD CV: S1 S2 RRR Lungs: Clear to auscultation bilaterally Abd: soft, nontender   Previous Pelvic Exam showed:  Normal external female genitalia; Bartholin's and Skene's glands normal in appearance; urethral meatus normal in appearance, no urethral masses or discharge.    CST: negative   s/p hysterectomy: Speculum exam reveals normal vaginal mucosa with  atrophy and normal vaginal cuff.  Adnexa no mass, fullness, tenderness.     Pelvic floor strength III/V, puborectalis IV/V external anal sphincter IV/V   Pelvic floor musculature: Right levator non-tender, Right obturator non-tender, Left levator non-tender, Left obturator non-tender   POP-Q:    POP-Q   -2.5                                            Aa   -2.5                                           Ba   -5.5                                              C    5                                            Gh   3.5                                            Pb   7.5                                             tvl    1.5                                            Ap   1.5                                            Bp                                                  D          Rectal Exam:  Normal sphincter tone, large distal rectocele, enterocoele present, no rectal masses, no sign of dyssynergia when asking the patient to bear down.    Assessment/ Plan  Assessment: The patient is a 67 y.o. year old scheduled to undergo Exam under anesthesia, Posterior repair, perineorrhaphy, Sacrospinous ligament fixation . Verbal consent was obtained for these procedures.

## 2022-07-16 NOTE — Patient Instructions (Signed)
Call with any concerns. We will see you for surgery!

## 2022-07-19 ENCOUNTER — Encounter (HOSPITAL_BASED_OUTPATIENT_CLINIC_OR_DEPARTMENT_OTHER): Payer: Self-pay | Admitting: Obstetrics and Gynecology

## 2022-07-19 ENCOUNTER — Ambulatory Visit (HOSPITAL_BASED_OUTPATIENT_CLINIC_OR_DEPARTMENT_OTHER): Payer: Medicare HMO | Admitting: Anesthesiology

## 2022-07-19 ENCOUNTER — Ambulatory Visit (HOSPITAL_BASED_OUTPATIENT_CLINIC_OR_DEPARTMENT_OTHER)
Admission: RE | Admit: 2022-07-19 | Discharge: 2022-07-19 | Disposition: A | Discharge status: Home / Self Care | Payer: Medicare HMO | Attending: Obstetrics and Gynecology | Admitting: Obstetrics and Gynecology

## 2022-07-19 ENCOUNTER — Encounter (HOSPITAL_BASED_OUTPATIENT_CLINIC_OR_DEPARTMENT_OTHER)
Admission: RE | Disposition: A | Discharge status: Home / Self Care | Payer: Self-pay | Source: Home / Self Care | Attending: Obstetrics and Gynecology

## 2022-07-19 ENCOUNTER — Other Ambulatory Visit: Payer: Self-pay

## 2022-07-19 DIAGNOSIS — N993 Prolapse of vaginal vault after hysterectomy: Secondary | ICD-10-CM | POA: Diagnosis not present

## 2022-07-19 DIAGNOSIS — N816 Rectocele: Secondary | ICD-10-CM | POA: Diagnosis not present

## 2022-07-19 DIAGNOSIS — K5909 Other constipation: Secondary | ICD-10-CM | POA: Diagnosis not present

## 2022-07-19 DIAGNOSIS — Z01818 Encounter for other preprocedural examination: Secondary | ICD-10-CM

## 2022-07-19 DIAGNOSIS — M1990 Unspecified osteoarthritis, unspecified site: Secondary | ICD-10-CM | POA: Diagnosis not present

## 2022-07-19 DIAGNOSIS — E785 Hyperlipidemia, unspecified: Secondary | ICD-10-CM | POA: Diagnosis not present

## 2022-07-19 DIAGNOSIS — K219 Gastro-esophageal reflux disease without esophagitis: Secondary | ICD-10-CM | POA: Insufficient documentation

## 2022-07-19 HISTORY — DX: Asymptomatic varicose veins of unspecified lower extremity: I83.90

## 2022-07-19 HISTORY — DX: Chronic idiopathic constipation: K59.04

## 2022-07-19 HISTORY — DX: Presence of spectacles and contact lenses: Z97.3

## 2022-07-19 HISTORY — DX: Prolapse of vaginal vault after hysterectomy: N99.3

## 2022-07-19 HISTORY — PX: ANTERIOR AND POSTERIOR REPAIR WITH SACROSPINOUS FIXATION: SHX6536

## 2022-07-19 HISTORY — DX: Gastro-esophageal reflux disease without esophagitis: K21.9

## 2022-07-19 HISTORY — DX: Hyperlipidemia, unspecified: E78.5

## 2022-07-19 HISTORY — DX: Rectocele: N81.6

## 2022-07-19 SURGERY — ANTERIOR AND POSTERIOR REPAIR WITH SACROSPINOUS FIXATION
Anesthesia: General

## 2022-07-19 MED ORDER — DEXAMETHASONE SODIUM PHOSPHATE 4 MG/ML IJ SOLN
INTRAMUSCULAR | Status: DC | PRN
  Administered 2022-07-19: 5 mg via INTRAVENOUS

## 2022-07-19 MED ORDER — FENTANYL CITRATE (PF) 100 MCG/2ML IJ SOLN: 25.0000 ug | INTRAMUSCULAR | Status: DC | PRN

## 2022-07-19 MED ORDER — LIDOCAINE HCL (CARDIAC) PF 100 MG/5ML IV SOSY
PREFILLED_SYRINGE | INTRAVENOUS | Status: DC | PRN
  Administered 2022-07-19: 60 mg via INTRAVENOUS

## 2022-07-19 MED ORDER — PROPOFOL 1000 MG/100ML IV EMUL
INTRAVENOUS | Status: AC
  Filled 2022-07-19: qty 100

## 2022-07-19 MED ORDER — FENTANYL CITRATE (PF) 100 MCG/2ML IJ SOLN
INTRAMUSCULAR | Status: AC
  Filled 2022-07-19: qty 2

## 2022-07-19 MED ORDER — LIDOCAINE-EPINEPHRINE 1 %-1:100000 IJ SOLN
INTRAMUSCULAR | Status: DC | PRN
  Administered 2022-07-19: 20 mL

## 2022-07-19 MED ORDER — CEFAZOLIN SODIUM-DEXTROSE 2-4 GM/100ML-% IV SOLN
INTRAVENOUS | Status: AC
  Filled 2022-07-19: qty 100

## 2022-07-19 MED ORDER — OXYCODONE HCL 5 MG PO TABS: 5.0000 mg | ORAL_TABLET | Freq: Once | ORAL | Status: DC | PRN

## 2022-07-19 MED ORDER — GABAPENTIN 300 MG PO CAPS
ORAL_CAPSULE | ORAL | Status: AC
  Filled 2022-07-19: qty 1

## 2022-07-19 MED ORDER — OXYCODONE HCL 5 MG/5ML PO SOLN: 5.0000 mg | Freq: Once | ORAL | Status: DC | PRN

## 2022-07-19 MED ORDER — ACETAMINOPHEN 500 MG PO TABS
ORAL_TABLET | ORAL | Status: AC
  Filled 2022-07-19: qty 2

## 2022-07-19 MED ORDER — LIDOCAINE HCL (PF) 2 % IJ SOLN
INTRAMUSCULAR | Status: AC
  Filled 2022-07-19: qty 10

## 2022-07-19 MED ORDER — EPHEDRINE 5 MG/ML INJ
INTRAVENOUS | Status: AC
  Filled 2022-07-19: qty 5

## 2022-07-19 MED ORDER — HEMOSTATIC AGENTS (NO CHARGE) OPTIME
TOPICAL | Status: DC | PRN
  Administered 2022-07-19: 1 via TOPICAL

## 2022-07-19 MED ORDER — ACETAMINOPHEN 500 MG PO TABS
1000.0000 mg | ORAL_TABLET | ORAL | Status: AC
  Administered 2022-07-19: 1000 mg via ORAL

## 2022-07-19 MED ORDER — FENTANYL CITRATE (PF) 100 MCG/2ML IJ SOLN
INTRAMUSCULAR | Status: DC | PRN
  Administered 2022-07-19 (×2): 25 ug via INTRAVENOUS

## 2022-07-19 MED ORDER — ONDANSETRON HCL 4 MG/2ML IJ SOLN
INTRAMUSCULAR | Status: DC | PRN
  Administered 2022-07-19: 4 mg via INTRAVENOUS

## 2022-07-19 MED ORDER — PROPOFOL 10 MG/ML IV BOLUS
INTRAVENOUS | Status: DC | PRN
  Administered 2022-07-19: 200 mg via INTRAVENOUS
  Administered 2022-07-19: 50 mg via INTRAVENOUS

## 2022-07-19 MED ORDER — GABAPENTIN 300 MG PO CAPS
300.0000 mg | ORAL_CAPSULE | ORAL | Status: AC
  Administered 2022-07-19: 300 mg via ORAL

## 2022-07-19 MED ORDER — EPHEDRINE SULFATE (PRESSORS) 50 MG/ML IJ SOLN
INTRAMUSCULAR | Status: DC | PRN
  Administered 2022-07-19 (×5): 5 mg via INTRAVENOUS

## 2022-07-19 MED ORDER — 0.9 % SODIUM CHLORIDE (POUR BTL) OPTIME
TOPICAL | Status: DC | PRN
  Administered 2022-07-19: 500 mL

## 2022-07-19 MED ORDER — LACTATED RINGERS IV SOLN
INTRAVENOUS | Status: DC
  Administered 2022-07-19: 1000 mL via INTRAVENOUS

## 2022-07-19 MED ORDER — AMISULPRIDE (ANTIEMETIC) 5 MG/2ML IV SOLN: 10.0000 mg | Freq: Once | INTRAVENOUS | Status: DC | PRN

## 2022-07-19 MED ORDER — CEFAZOLIN SODIUM-DEXTROSE 2-4 GM/100ML-% IV SOLN
2.0000 g | INTRAVENOUS | Status: AC
  Administered 2022-07-19: 2 g via INTRAVENOUS

## 2022-07-19 SURGICAL SUPPLY — 33 items
AGENT HMST KT MTR STRL THRMB (HEMOSTASIS) ×1
BLADE CLIPPER SENSICLIP SURGIC (BLADE) IMPLANT
BLADE SURG 15 STRL LF DISP TIS (BLADE) ×1 IMPLANT
BLADE SURG 15 STRL SS (BLADE) ×1
DEVICE CAPIO SLIM SINGLE (INSTRUMENTS) IMPLANT
GAUZE 4X4 16PLY ~~LOC~~+RFID DBL (SPONGE) IMPLANT
GLOVE BIOGEL PI IND STRL 6.5 (GLOVE) ×1 IMPLANT
GLOVE BIOGEL PI IND STRL 7.0 (GLOVE) ×1 IMPLANT
GLOVE ECLIPSE 6.0 STRL STRAW (GLOVE) ×1 IMPLANT
GOWN STRL REUS W/TWL LRG LVL3 (GOWN DISPOSABLE) ×1 IMPLANT
HIBICLENS CHG 4% 4OZ BTL (MISCELLANEOUS) ×1 IMPLANT
HOLDER FOLEY CATH W/STRAP (MISCELLANEOUS) ×1 IMPLANT
KIT TURNOVER CYSTO (KITS) ×1 IMPLANT
NDL MAYO 6 CRC TAPER PT (NEEDLE) IMPLANT
NEEDLE HYPO 22GX1.5 SAFETY (NEEDLE) ×1 IMPLANT
NEEDLE MAYO 6 CRC TAPER PT (NEEDLE) ×1 IMPLANT
NS IRRIG 1000ML POUR BTL (IV SOLUTION) ×1 IMPLANT
PACK VAGINAL WOMENS (CUSTOM PROCEDURE TRAY) ×1 IMPLANT
PAD OB MATERNITY 4.3X12.25 (PERSONAL CARE ITEMS) ×1 IMPLANT
RETRACTOR LONE STAR DISPOSABLE (INSTRUMENTS) ×1 IMPLANT
RETRACTOR STAY HOOK 5MM (MISCELLANEOUS) ×1 IMPLANT
SET IRRIG Y TYPE TUR BLADDER L (SET/KITS/TRAYS/PACK) ×1 IMPLANT
SPIKE FLUID TRANSFER (MISCELLANEOUS) IMPLANT
SURGIFLO W/THROMBIN 8M KIT (HEMOSTASIS) IMPLANT
SUT ABS MONO DBL WITH NDL 48IN (SUTURE) IMPLANT
SUT VIC AB 0 CT1 27 (SUTURE) ×1
SUT VIC AB 0 CT1 27XBRD ANBCTR (SUTURE) IMPLANT
SUT VIC AB 2-0 SH 27 (SUTURE)
SUT VIC AB 2-0 SH 27XBRD (SUTURE) IMPLANT
SUT VICRYL 2-0 SH 8X27 (SUTURE) ×1 IMPLANT
SYR BULB EAR ULCER 3OZ GRN STR (SYRINGE) ×1 IMPLANT
TOWEL OR 17X26 10 PK STRL BLUE (TOWEL DISPOSABLE) ×1 IMPLANT
TRAY FOLEY W/BAG SLVR 14FR LF (SET/KITS/TRAYS/PACK) ×1 IMPLANT

## 2022-07-19 NOTE — Anesthesia Preprocedure Evaluation (Signed)
Anesthesia Evaluation  Patient identified by MRN, date of birth, ID band Patient awake    Reviewed: Allergy & Precautions, NPO status , Patient's Chart, lab work & pertinent test results  Airway Mallampati: II  TM Distance: >3 FB Neck ROM: Full    Dental  (+) Dental Advisory Given   Pulmonary neg pulmonary ROS   breath sounds clear to auscultation       Cardiovascular negative cardio ROS  Rhythm:Regular Rate:Normal     Neuro/Psych negative neurological ROS     GI/Hepatic Neg liver ROS,GERD  ,,  Endo/Other  negative endocrine ROS    Renal/GU negative Renal ROS     Musculoskeletal  (+) Arthritis ,    Abdominal   Peds  Hematology negative hematology ROS (+)   Anesthesia Other Findings   Reproductive/Obstetrics                             Anesthesia Physical Anesthesia Plan  ASA: 2  Anesthesia Plan: General   Post-op Pain Management: Tylenol PO (pre-op)*, Gabapentin PO (pre-op)* and Toradol IV (intra-op)*   Induction: Intravenous  PONV Risk Score and Plan: 3 and Dexamethasone, Ondansetron and Treatment may vary due to age or medical condition  Airway Management Planned: LMA  Additional Equipment:   Intra-op Plan:   Post-operative Plan: Extubation in OR  Informed Consent: I have reviewed the patients History and Physical, chart, labs and discussed the procedure including the risks, benefits and alternatives for the proposed anesthesia with the patient or authorized representative who has indicated his/her understanding and acceptance.     Dental advisory given  Plan Discussed with: CRNA  Anesthesia Plan Comments:        Anesthesia Quick Evaluation

## 2022-07-19 NOTE — Anesthesia Procedure Notes (Signed)
Procedure Name: LMA Insertion Date/Time: 07/19/2022 12:19 PM  Performed by: Rogers Blocker, CRNAPre-anesthesia Checklist: Patient identified, Emergency Drugs available, Suction available and Patient being monitored Patient Re-evaluated:Patient Re-evaluated prior to induction Oxygen Delivery Method: Circle System Utilized Preoxygenation: Pre-oxygenation with 100% oxygen Induction Type: IV induction Ventilation: Mask ventilation without difficulty LMA: LMA inserted LMA Size: 4.0 Number of attempts: 1 Placement Confirmation: positive ETCO2 Tube secured with: Tape Dental Injury: Teeth and Oropharynx as per pre-operative assessment

## 2022-07-19 NOTE — Op Note (Signed)
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 She was placed in dorsal lithotomy position, taking care to avoid any traction on the extremities, and then prepped and draped in the usual sterile fashion. A self-retaining lonestar retractor was placed using four elastic blue stays.  After a foley catheter was inserted into the urethra.  Vaginal exam concluded that an apical procedure was also needed. Two Allis clamps were along the posterior vaginal wall defect. 1% lidocaine with epinephrine was injected into the vaginal mucosa.  A vertical incision was made between these two Allis clamps with a 15 blade scalpel and a diamond shaped incision was made over the perineum. Excess perineal skin was removed.  Allis clamps were placed along this incision and Metzenbaum scissors were used to undermine the vaginal mucosa along the incision.  The vaginal mucosa was then sharply dissected off to the septum bilaterally.    For the sacrospinous ligament fixation (SSLF), the ischial spine was accessed on the right side via dissection with scissors and blunt dissection.  The sacrospinous ligament was palpated. Two 0 PDS suture was then placed at the sacrospinous ligament two fingerbreadths medial to the ischial spine, in order to avoid the pudendal neurovascular bundle, using a Capio needle driver.  The PDS suture was  attached to the vaginal epithelium on the ipsilateral side of the vaginal apex and held. Posterior plication of the rectovaginal septum was then performed using plicating sutures of 2-0 Vicryl. The last distal stitch incorporated the perineal body in a U stitch fashion. Hemostasis was obtained and surgiflo was placed on the surgical bed. The vaginal mucosal edges were trimmed and the incision reapproximated with 2-0 Vicryl in a running fashion. The SSLF suture was then tied down with excellent support of the posterior and apical vagina.   The perineal body was then reapproximated with two interrupted 0-vicryl sutures. The perineal skin was then closed with a 2-0 vicryl in a subcutaneous and subcuticular fashion. Irrigation was performed and good hemostasis was noted. The vagina was copiously irrigated.  Hemostasis was again noted.  Vaginal packing was not placed.  A rectal examination was normal and confirmed no sutures within the rectum. The patient tolerated the procedure well.  She was awakened from anesthesia and transferred to the recovery room in stable condition. All counts were correct x 2.     [name], MD

## 2022-07-19 NOTE — Discharge Instructions (Addendum)

## 2022-07-19 NOTE — Transfer of Care (Signed)
Immediate Anesthesia Transfer of Care Note  Patient: Taylor Singleton  Procedure(s) Performed: [place] AND  SACROSPINOUS FIXATION  Patient Location: PACU  Anesthesia Type:General  Level of Consciousness: awake, alert , oriented, and patient cooperative  Airway & Oxygen Therapy: Patient Spontanous Breathing and Patient connected to nasal cannula oxygen  Post-op Assessment: Report given to RN and Post -op Vital signs reviewed and stable  Post vital signs: Reviewed and stable  Last Vitals:  Vitals Value Taken Time  BP 148/72 07/19/22 1347  Temp    Pulse 107 07/19/22 1348  Resp 18 07/19/22 1348  SpO2 97 % 07/19/22 1348  Vitals shown include unvalidated device data.  Last Pain:  Vitals:   07/19/22 0937  TempSrc: Oral  PainSc: 0-No pain      Patients Stated Pain Goal: 7 (34/19/37 9024)  Complications: No notable events documented.

## 2022-07-19 NOTE — Interval H&P Note (Signed)
History and Physical Interval Note:  07/19/2022 11:48 AM  Taylor Singleton  has presented today for surgery, with the diagnosis of posterior vaginal prolapse; prolapse of vaginal vault after hysterectomy.  The various methods of treatment have been discussed with the patient and family. After consideration of risks, benefits and other options for treatment, the patient has consented to  Procedure(s) with comments: [place]  SACROSPINOUS FIXATION (N/A) as a surgical intervention.  The patient's history has been reviewed, patient examined, no change in status, stable for surgery.  I have reviewed the patient's chart and labs.  Questions were answered to the patient's satisfaction.     [name]

## 2022-07-19 NOTE — Progress Notes (Signed)
Voiding Trial: 300 ml instilled in bladder 100 ml voided by patient 115 ml left in bladder scan.  Dr. Wannetta Sender notified of amounts, no new orders at this time.

## 2022-07-20 ENCOUNTER — Encounter (HOSPITAL_BASED_OUTPATIENT_CLINIC_OR_DEPARTMENT_OTHER): Payer: Self-pay | Admitting: Obstetrics and Gynecology

## 2022-07-20 ENCOUNTER — Telehealth: Payer: Self-pay | Admitting: Obstetrics and Gynecology

## 2022-07-20 DIAGNOSIS — Z9889 Other specified postprocedural states: Secondary | ICD-10-CM

## 2022-07-20 NOTE — Anesthesia Postprocedure Evaluation (Signed)
Anesthesia Post Note  Patient: Taylor Singleton  Procedure(s) Performed: POSTERIOR REPAIR WITHP PERINEORRHAPHY AND  SACROSPINOUS FIXATION     Patient location during evaluation: PACU Anesthesia Type: General Level of consciousness: awake and alert Pain management: pain level controlled Vital Signs Assessment: post-procedure vital signs reviewed and stable Respiratory status: spontaneous breathing, nonlabored ventilation, respiratory function stable and patient connected to nasal cannula oxygen Cardiovascular status: blood pressure returned to baseline and stable Postop Assessment: no apparent nausea or vomiting Anesthetic complications: no   No notable events documented.  Last Vitals:  Vitals:   07/19/22 1415 07/19/22 1541  BP: (!) 149/64 131/66  Pulse: 88 92  Resp: 19 13  Temp:  (!) 36.3 C  SpO2: 92% 96%    Last Pain:  Vitals:   07/19/22 1541  TempSrc:   PainSc: 0-No pain                 Tiajuana Amass

## 2022-07-20 NOTE — Telephone Encounter (Signed)
Taylor Singleton underwent posterior repair, perineorrhaphy, sacrospinous ligament fixation on 07/20/22.   She passed her voiding trial.  323ml was backfilled into the bladder Voided 133ml  PVR by bladder scan was 179ml.   She was discharged without a catheter. Please call her for a routine post op check. Thanks!  [name], MD

## 2022-07-21 NOTE — Telephone Encounter (Signed)
Called and spoke to patient: Pain 9/10 and is taking Tramadol, is taking Tylenol and Ibuprofen alternating on a schedule.  Bleeding is controlled, reports she saw some blood when first got home but is not seeing blood any longer. Felt constipated and took Miralax on Tuesday and milk of magnesia Tuesday at 5pm and had a BM at 5:30pm. Had another BM on the same day.  Urination is doing well. Reports she has had maybe a little leakage but is able to urinate normally.  Reports the tip of her tongue is numb.  Will follow up with patient tomorrow to assess her pain control, suggested she take the Tramdol every 4 hours today and  use heat and ice therapy externally.

## 2022-07-22 ENCOUNTER — Encounter: Payer: Self-pay | Admitting: Obstetrics and Gynecology

## 2022-07-22 MED ORDER — GABAPENTIN 100 MG PO CAPS: 100.0000 mg | ORAL_CAPSULE | Freq: Three times a day (TID) | ORAL | 0 refills | Status: DC

## 2022-07-22 NOTE — Telephone Encounter (Signed)
Called this morning to reassess patient's pain: Reports she is concerned that the Tramadol is making her sick. Will send in Gabapentin 100mg  as an alternative for patient to try and control her pain.   Reports she had another BM this morning at 0230. Patient reports she believes she is overdoing the medication to help her bowels as she is having loose stools. Encouraged her to cut the dosage in half or not take any for the day and see how her bowels do.

## 2022-07-27 ENCOUNTER — Encounter: Payer: Medicare HMO | Admitting: Obstetrics and Gynecology

## 2022-07-29 ENCOUNTER — Telehealth: Payer: Self-pay | Admitting: Obstetrics and Gynecology

## 2022-07-29 NOTE — Telephone Encounter (Signed)
Patient reports she is having issues with her bowels. We discussed that I do not want her straining or pushing. She reports she believes she needs more fiber and is having trouble with that. Suggested Metamucil to add some fiber to her diet and assist with a more bulked bowel movement. Also suggested a squatty potty to assist with ease of bowel movements.  She reports she is having a slight burning sensation in the vaginal area. From her description it does not sound like an infection, more likely the sutures dissolving in the vaginal wall, but informed patient if she was still having irritation and was concerned she could call and we would get her in for an examination.   She reports understanding of recommendations and reports she will call tomorrow with any issues.

## 2022-07-30 ENCOUNTER — Other Ambulatory Visit (INDEPENDENT_AMBULATORY_CARE_PROVIDER_SITE_OTHER): Payer: Medicare HMO | Admitting: Obstetrics & Gynecology

## 2022-07-30 DIAGNOSIS — B3731 Acute candidiasis of vulva and vagina: Secondary | ICD-10-CM

## 2022-07-30 MED ORDER — FLUCONAZOLE 150 MG PO TABS: 150.0000 mg | ORAL_TABLET | ORAL | 1 refills | Status: AC

## 2022-07-30 NOTE — Progress Notes (Signed)
Patient called at 6:57 pm for Postop Rectal prolapse correction on 07/19/22 with vaginal burning and vulvar irritation.  Reports urine leakage.   Counseling done. Water spray on the vulva after urine leakage. Will treat for a probable yeast vaginitis with Fluconazole.  Usage reviewed, prescription sent.  Hydrocortisone 1% on vulva BID as needed.

## 2022-08-02 ENCOUNTER — Encounter: Payer: Self-pay | Admitting: *Deleted

## 2022-08-04 ENCOUNTER — Encounter: Payer: Self-pay | Admitting: Obstetrics and Gynecology

## 2022-08-04 ENCOUNTER — Ambulatory Visit (INDEPENDENT_AMBULATORY_CARE_PROVIDER_SITE_OTHER): Payer: Medicare HMO | Admitting: Obstetrics and Gynecology

## 2022-08-04 VITALS — BP 122/76 | HR 101

## 2022-08-04 DIAGNOSIS — N952 Postmenopausal atrophic vaginitis: Secondary | ICD-10-CM

## 2022-08-04 DIAGNOSIS — Z9889 Other specified postprocedural states: Secondary | ICD-10-CM

## 2022-08-04 DIAGNOSIS — N393 Stress incontinence (female) (male): Secondary | ICD-10-CM

## 2022-08-04 MED ORDER — ESTRADIOL 0.1 MG/GM VA CREA: 0.5000 g | TOPICAL_CREAM | VAGINAL | 11 refills | Status: DC

## 2022-08-04 MED ORDER — BENZONATATE 200 MG PO CAPS: 200.0000 mg | ORAL_CAPSULE | Freq: Two times a day (BID) | ORAL | 0 refills | Status: DC | PRN

## 2022-08-04 NOTE — Progress Notes (Signed)
[facility] Return Visit  SUBJECTIVE  History of Present Illness: Taylor Singleton is a 67 y.o. female seen for post op.   Patient had called the after hours number and reported some itching and irritation. She was prescribed Diflucan by Dr. Dellis Singleton who was on call for possible yeast.   Patient reports a small amount of bleeding on her tissue when wiping.   Reports the numbness on the tip of her tongue has gone away.    Past Medical History: Patient  has a past medical history of Arthritis, Chronic idiopathic constipation, GERD (gastroesophageal reflux disease), HLD (hyperlipidemia), Hyperlipidemia, Osteoporosis, Prolapse of posterior vaginal wall, Sciatica, Vaginal vault 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, Varicose vein of leg, and Wears glasses.   Past Surgical History: She  has a past surgical history that includes Total abdominal hysterectomy w/ bilateral salpingoophorectomy; Breast cyst excision (Left); Cholecystectomy, laparoscopic (2010); and Anterior and posterior repair with sacrospinous fixation (N/A, 07/19/2022).   Medications: She has a current medication list which includes the following prescription(s): acetaminophen, alendronate, atorvastatin, benzonatate, calcium/vitamin d, [START ON 08/05/2022] estradiol, fluconazole, gabapentin, ibuprofen, omega-3 fatty acids, OVER THE COUNTER MEDICATION, pantoprazole, and polyethylene glycol 3350.   Allergies: Patient has No Known Allergies.   Social History: Patient  reports that she has never smoked. She has never used smokeless tobacco. She reports that she does not drink alcohol and does not use drugs.      OBJECTIVE     Physical Exam: Vitals:   08/04/22 0922  BP: 122/76  Pulse: (!) 101   Gen: No apparent distress, A&O x 3.  Detailed Urogynecologic Evaluation:   Suture line has some irritation but no obvious pus or infection showing. Irritation noted around the cuff with some bleeding noted. No obvious signs of yeast  or other infection. No notable dehiscence along suture line.        ASSESSMENT AND PLAN    Taylor Singleton is a 67 y.o. with:  1. Vaginal atrophy   2. Postoperative state   3. SUI (stress urinary incontinence, female)    Will have patient start vaginal estrogen nightly for 2 weeks and then twice weekly. We discussed application of cream and how to use it. Patient reports understanding.   Will obtain Aptima swab to assess for possible yeast Discussed that this can be worsened after surgery and may be related to her hard coughing. Patient to take tessalon pearls to assist in harsh coughing.   Patient to follow up in about 2 weeks with me to evaluate healing and then regular 6 week post op with Taylor Singleton.

## 2022-08-04 NOTE — Patient Instructions (Addendum)
Do estrogen cream a blueberry sized amount onto the finger into the vagina and around the vaginal opening daily for 2 weeks. After the two weeks you will decrease to twice weekly for insertion of the cream.

## 2022-08-05 ENCOUNTER — Other Ambulatory Visit (HOSPITAL_COMMUNITY)
Admission: RE | Admit: 2022-08-05 | Discharge: 2022-08-05 | Disposition: A | Discharge status: Home / Self Care | Payer: Medicare HMO | Source: Ambulatory Visit | Attending: Obstetrics and Gynecology | Admitting: Obstetrics and Gynecology

## 2022-08-05 DIAGNOSIS — Z9889 Other specified postprocedural states: Secondary | ICD-10-CM | POA: Diagnosis present

## 2022-08-05 NOTE — Addendum Note (Signed)
Addended by: Berton Mount on: 08/05/2022 01:15 PM   Modules accepted: Orders

## 2022-08-06 LAB — CERVICOVAGINAL ANCILLARY ONLY
Bacterial Vaginitis (gardnerella): NEGATIVE
Candida Glabrata: NEGATIVE
Candida Vaginitis: NEGATIVE
Comment: NEGATIVE
Comment: NEGATIVE
Comment: NEGATIVE

## 2022-08-09 ENCOUNTER — Telehealth: Payer: Self-pay | Admitting: Obstetrics and Gynecology

## 2022-08-09 DIAGNOSIS — Z9889 Other specified postprocedural states: Secondary | ICD-10-CM

## 2022-08-09 NOTE — Telephone Encounter (Signed)
Called Patient and discussed there is no current signs of yeast or BV. She overall reports she is doing well. No bleeding now and the vaginal estrogen cream has decreased the burning/stinging/itching. She has an appointment to see me on 08/16/2022 and asked if she should still come. We will plan for her to keep the appointment at this time and she will call if she wants to cancel or has any other concerns.

## 2022-08-12 ENCOUNTER — Telehealth: Payer: Self-pay | Admitting: Obstetrics and Gynecology

## 2022-08-12 DIAGNOSIS — Z9889 Other specified postprocedural states: Secondary | ICD-10-CM

## 2022-08-12 NOTE — Telephone Encounter (Signed)
Patient called and reports she has a redness around the anus and in the peri area. She reports a stinging sensation in the tissues when she pees. She reports she has not had any bleeding, fevers, nausea or vomiting. She reports she is concerned she has yeast but we discussed that it is unlikely with what she is describing.  She was encouraged to use the estrogen cream in the vagina still to promote tissue healing.  We discussed using a diaper rash cream like Desitin to help with the peri and anal irritation.  Patient has an appointment on Monday and I will check the tissues then.

## 2022-08-16 ENCOUNTER — Encounter: Payer: Self-pay | Admitting: Obstetrics and Gynecology

## 2022-08-16 ENCOUNTER — Ambulatory Visit (INDEPENDENT_AMBULATORY_CARE_PROVIDER_SITE_OTHER): Payer: Medicare HMO | Admitting: Obstetrics and Gynecology

## 2022-08-16 VITALS — BP 120/77 | HR 96

## 2022-08-16 DIAGNOSIS — Z9889 Other specified postprocedural states: Secondary | ICD-10-CM

## 2022-08-16 NOTE — Patient Instructions (Signed)
You are doing great.   Use the estrogen cream x2 a week until your regular post op with Dr. Wannetta Sender   Use Destin if you develop the irritation around anus again.   No hill walking until 6 week follow up.   Youi can do light vacuuming unless you feel stitches pulling then stop.

## 2022-08-16 NOTE — Progress Notes (Signed)
[facility] Return Visit  SUBJECTIVE  History of Present Illness: Taylor Singleton is a 67 y.o. female seen in follow-up for vaginal itching, post op irritation. Plan at last visit was do vaginal estrogen cream nightly for two weeks then twice a week after and use a desitin or zinc based diaper rash cream to buttocks region.    Patient reports she is doing much better and feels like things have calmed down.     Past Medical History: Patient  has a past medical history of Arthritis, Chronic idiopathic constipation, GERD (gastroesophageal reflux disease), HLD (hyperlipidemia), Hyperlipidemia, Osteoporosis, Prolapse of posterior vaginal wall, Sciatica, Vaginal vault 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, Varicose vein of leg, and Wears glasses.   Past Surgical History: She  has a past surgical history that includes Total abdominal hysterectomy w/ bilateral salpingoophorectomy; Breast cyst excision (Left); Cholecystectomy, laparoscopic (2010); and Anterior and posterior repair with sacrospinous fixation (N/A, 07/19/2022).   Medications: She has a current medication list which includes the following prescription(s): acetaminophen, alendronate, atorvastatin, benzonatate, calcium/vitamin d, estradiol, gabapentin, ibuprofen, omega-3 fatty acids, OVER THE COUNTER MEDICATION, pantoprazole, and polyethylene glycol 3350.   Allergies: Patient has No Known Allergies.   Social History: Patient  reports that she has never smoked. She has never used smokeless tobacco. She reports that she does not drink alcohol and does not use drugs.      OBJECTIVE     Physical Exam: Vitals:   08/16/22 1125  BP: 120/77  Pulse: 96   Gen: No apparent distress, A&O x 3.  Detailed Urogynecologic Evaluation:     ASSESSMENT AND PLAN    Ms. Policarpio is a 67 y.o. with:  1. Post-operative state    Patient is overall doing well and reports she is healing better. Her tissue around the rectum has improved with the  Desitin cream. She also reports decreased irritation in the vaginal tract. Patient is no longer having bleeding.   Patient to follow up with Dr. Wannetta Sender for regular post-op on 08/31/22

## 2022-08-31 ENCOUNTER — Ambulatory Visit (INDEPENDENT_AMBULATORY_CARE_PROVIDER_SITE_OTHER): Payer: Medicare HMO | Admitting: Obstetrics and Gynecology

## 2022-08-31 ENCOUNTER — Encounter: Payer: Self-pay | Admitting: Obstetrics and Gynecology

## 2022-08-31 VITALS — BP 136/83 | HR 94

## 2022-08-31 DIAGNOSIS — Z9889 Other specified postprocedural states: Secondary | ICD-10-CM

## 2022-08-31 NOTE — Patient Instructions (Signed)
Start estrogen cream 0.5 g (fingertip amount) twice a week inside of the vagina.

## 2022-08-31 NOTE — Progress Notes (Signed)
[facility]  Date of Visit: 08/31/2022  History of Present Illness: Ms. Ormond is a 67 y.o. female scheduled today for a post-operative visit.   Surgery: s/p Posterior repair, perineorrhaphy, sacrospinous ligament fixation on 07/19/22  She passed her postoperative void trial.   Postoperative course has been uncomplicated.   Today she reports she has some irritation deeper inside of the vagina. She is no longer using anything for irritation anymore as this has resolved.   UTI in the last 6 weeks? No  Pain? No  She has returned to her normal activity (except for postop restrictions) Vaginal bulge? No  Stress incontinence: No , but has noticed more of a discharge but does not think it is urine.  Urgency/frequency: No  Urge incontinence: No  Voiding dysfunction: No  Bowel issues: No . She was doing metamucil once a day but only had smaller stools so has restarted miralax a few days ago.   Subjective Success: Do you usually have a bulge or something falling out that you can see or feel in the vaginal area? No  Retreatment Success: Any retreatment with surgery or pessary for any compartment? No    Medications: She has a current medication list which includes the following prescription(s): alendronate, atorvastatin, benzonatate, calcium/vitamin d, estradiol, gabapentin, omega-3 fatty acids, OVER THE COUNTER MEDICATION, pantoprazole, and polyethylene glycol 3350.   Allergies: Patient has No Known Allergies.   Physical Exam: BP 136/83   Pulse 94    Pelvic Examination: Vagina: Incisions healing well. Sutures are present at the apex without granulation tissue. No tenderness along the anterior or posterior vagina. No apical tenderness. No pelvic masses.   POP-Q: POP-Q  -2.5                                            Aa   -2.5                                           Ba  -7.5                                              C   2                                            Gh   4                                            Pb  8                                            tvl   -3  Ap  -3                                            Bp                                                 D     ---------------------------------------------------------  Assessment and Plan:  1. Post-operative state    - Healing well. We discussed that sutures at top of the vagina (PDS) can last several months.  - Restart estrogen cream 0.5g twice a week - Can resume regular activity including exercise and intercourse,  if desired.  - Discussed avoidance of heavy lifting and straining long term to reduce the risk of recurrence.   All questions answered.   [name], MD

## 2022-09-15 NOTE — Progress Notes (Signed)
[facility] Return Visit  SUBJECTIVE  History of Present Illness: Taylor Singleton is a 67 y.o. female seen in follow-up for vaginal burning. Plan at last visit was use estrogen cream to promote vaginal tissue healing.   Patient has irritation around the buttocks region and at the vaginal opening.   Reports she has been wearing pads at work. Has not attempted intercourse yet.    Past Medical History: Patient  has a past medical history of Arthritis, Chronic idiopathic constipation, GERD (gastroesophageal reflux disease), HLD (hyperlipidemia), Hyperlipidemia, Osteoporosis, Prolapse of posterior vaginal wall, Sciatica, Vaginal vault 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, Varicose vein of leg, and Wears glasses.   Past Surgical History: She  has a past surgical history that includes Total abdominal hysterectomy w/ bilateral salpingoophorectomy; Breast cyst excision (Left); Cholecystectomy, laparoscopic (2010); and Anterior and posterior repair with sacrospinous fixation (N/A, 07/19/2022).   Medications: She has a current medication list which includes the following prescription(s): alendronate, atorvastatin, benzonatate, calcium/vitamin d, estradiol, gabapentin, omega-3 fatty acids, OVER THE COUNTER MEDICATION, pantoprazole, and polyethylene glycol 3350.   Allergies: Patient has No Known Allergies.   Social History: Patient  reports that she has never smoked. She has never used smokeless tobacco. She reports that she does not drink alcohol and does not use drugs.      OBJECTIVE     Physical Exam: Vitals:   09/17/22 0845  BP: 114/72  Pulse: 84   Gen: No apparent distress, A&O x 3.  Detailed Urogynecologic Evaluation:   No sign of abscess bleeding or infection. She has some redness at the vaginal opening and some irritation noted around the perineum and anus.      ASSESSMENT AND PLAN    [name] is a 67 y.o. with:  1. Post-operative state   2. Vaginal atrophy    Encouraged  patient to use Desitin around the perineum and anus when wearing pads.  Patient has estrogen cream at home but reports it was burning. Encouraged her to mix it with Vaseline and insert it into the vaginal opening and around the introitus.   Patient to return PRN.

## 2022-09-17 ENCOUNTER — Encounter: Payer: Self-pay | Admitting: Obstetrics and Gynecology

## 2022-09-17 ENCOUNTER — Ambulatory Visit (INDEPENDENT_AMBULATORY_CARE_PROVIDER_SITE_OTHER): Payer: Medicare HMO | Admitting: Obstetrics and Gynecology

## 2022-09-17 VITALS — BP 114/72 | HR 84

## 2022-09-17 DIAGNOSIS — N952 Postmenopausal atrophic vaginitis: Secondary | ICD-10-CM

## 2022-09-17 DIAGNOSIS — Z9889 Other specified postprocedural states: Secondary | ICD-10-CM

## 2022-09-17 NOTE — Patient Instructions (Addendum)
Use the Desitin around the buttocks area if you are wearing a pad   For the Estradiol cream mix it with some Vaseline and apply into the vaginal opening around the entrance to the vagina.  Do this 3 times a week.

## 2022-09-20 ENCOUNTER — Telehealth: Payer: Self-pay

## 2022-09-20 NOTE — Telephone Encounter (Signed)
Pt called due to increasing burning on the vulva. Seen in the office Friday and swab negative for yeast and BV. She had restarted the estrogen cream but has been mixing with vaseline but still having burning. Advised to stop the cream for now. Can use hydrocortisone cream on the vulva and top with desitin for moisture barrier. She is trying not to use pads but is using it when she is out. Advised her to make sure she is using a cotton only pad.   She will call back if symptoms do not resolve.   Marguerita Beards, MD

## 2022-10-01 ENCOUNTER — Emergency Department (HOSPITAL_COMMUNITY)
Admission: EM | Admit: 2022-10-01 | Discharge: 2022-10-01 | Disposition: A | Discharge status: Home / Self Care | Payer: Medicare HMO | Attending: Emergency Medicine | Admitting: Emergency Medicine

## 2022-10-01 ENCOUNTER — Other Ambulatory Visit: Payer: Self-pay

## 2022-10-01 ENCOUNTER — Emergency Department (HOSPITAL_COMMUNITY): Payer: Medicare HMO

## 2022-10-01 ENCOUNTER — Other Ambulatory Visit: Payer: Medicare HMO

## 2022-10-01 ENCOUNTER — Encounter: Payer: Medicare HMO | Admitting: Obstetrics and Gynecology

## 2022-10-01 ENCOUNTER — Encounter (HOSPITAL_COMMUNITY): Payer: Self-pay

## 2022-10-01 DIAGNOSIS — M25552 Pain in left hip: Secondary | ICD-10-CM | POA: Diagnosis not present

## 2022-10-01 DIAGNOSIS — R739 Hyperglycemia, unspecified: Secondary | ICD-10-CM | POA: Diagnosis not present

## 2022-10-01 DIAGNOSIS — S39012A Strain of muscle, fascia and tendon of lower back, initial encounter: Secondary | ICD-10-CM | POA: Diagnosis not present

## 2022-10-01 DIAGNOSIS — S3992XA Unspecified injury of lower back, initial encounter: Secondary | ICD-10-CM | POA: Diagnosis not present

## 2022-10-01 DIAGNOSIS — E7800 Pure hypercholesterolemia, unspecified: Secondary | ICD-10-CM | POA: Diagnosis not present

## 2022-10-01 DIAGNOSIS — W228XXA Striking against or struck by other objects, initial encounter: Secondary | ICD-10-CM | POA: Insufficient documentation

## 2022-10-01 MED ORDER — METHYLPREDNISOLONE 4 MG PO TBPK: ORAL_TABLET | ORAL | 0 refills | Status: DC

## 2022-10-01 MED ORDER — MELOXICAM 15 MG PO TABS: 15.0000 mg | ORAL_TABLET | Freq: Every day | ORAL | 0 refills | Status: AC

## 2022-10-01 NOTE — ED Provider Notes (Signed)
[place] EMERGENCY DEPARTMENT AT [facility] Provider Note   CSN: [id number] Arrival date & time: 10/01/22  7829     History  Chief Complaint  Patient presents with   Back Pain    Taylor Singleton is a 67 y.o. female.  Patient presents the emergency department complaining of lower left-sided back pain.  Patient states that on Tuesday she was moving a case of water and suddenly had pain in the left lumbar region.  She does have history of sciatica but states that she does not believe this is related to her underlying sciatica.  Patient denies numbness or tingling in her legs, denies saddle anesthesia, denies urinary incontinence, urinary retention, fecal incontinence.  The patient did not fall and denies any direct trauma to the area.  Past medical history significant for arthritis, sciatica, varicose veins, GERD, osteoporosis  HPI     Home Medications Prior to Admission medications   Medication Sig Start Date End Date Taking? Authorizing Provider  meloxicam (MOBIC) 15 MG tablet Take 1 tablet (15 mg total) by mouth daily. 10/01/22 10/31/22 Yes Darrick Grinder, PA-C  methylPREDNISolone (MEDROL DOSEPAK) 4 MG TBPK tablet Take as directed per package instructions 10/01/22  Yes Darrick Grinder, PA-C  alendronate (FOSAMAX) 70 MG tablet TAKE 1 TABLET BY MOUTH EVERY 7 DAYS. TAKE WITH A FULL GLASS OF WATER ON AN EMPTY STOMACH. 01/25/22   Donita Brooks, MD  atorvastatin (LIPITOR) 40 MG tablet Take 1 tablet (40 mg total) by mouth daily. Patient taking differently: Take 40 mg by mouth daily. 12/22/21   Donita Brooks, MD  benzonatate (TESSALON) 200 MG capsule Take 1 capsule (200 mg total) by mouth 2 (two) times daily as needed for cough. 08/04/22   Selmer Dominion, NP  Calcium Carb-Cholecalciferol (CALCIUM/VITAMIN D) 600-400 MG-UNIT TABS Take 2 tablets by mouth daily.    [provider]  estradiol (ESTRACE) 0.1 MG/GM vaginal cream Place 0.5 g vaginally 2 (two) times a week.  Place 0.5g nightly for two weeks then twice a week after 08/05/22   Selmer Dominion, NP  gabapentin (NEURONTIN) 100 MG capsule Take 1 capsule (100 mg total) by mouth 3 (three) times daily. 07/22/22   Selmer Dominion, NP  Omega-3 Fatty Acids (FISH OIL OMEGA-3 PO) Take by mouth daily.    [provider]  OVER THE COUNTER MEDICATION Take 1 tablet by mouth daily. gummy    [provider]  pantoprazole (PROTONIX) 40 MG tablet Take 1 tablet (40 mg total) by mouth daily. Patient taking differently: Take 40 mg by mouth daily. 07/13/22   Donita Brooks, MD  Polyethylene Glycol 3350 (MIRALAX PO) Take by mouth daily.    [provider]      Allergies    Patient has no known allergies.    Review of Systems   Review of Systems  Physical Exam Updated Vital Signs BP (!) 156/88 (BP Location: Right Arm)   Pulse 98   Temp 98.5 F (36.9 C) (Oral)   Resp 18   Ht  (1.575 m)   Wt 72.1 kg   SpO2 99%   BMI 29.08 kg/m  Physical Exam HENT:     Head: Normocephalic and atraumatic.  Eyes:     Pupils: Pupils are equal, round, and reactive to light.  Pulmonary:     Effort: Pulmonary effort is normal. No respiratory distress.  Musculoskeletal:        General: Tenderness present. No swelling, deformity or  signs of injury.     Cervical back: Normal range of motion.     Comments: Patient with tenderness to palpation of the left lower lumbar region with no midline spinal tenderness appreciated  Skin:    General: Skin is dry.  Neurological:     Mental Status: She is alert.     Comments: Patient with grossly equal strength in bilateral lower extremities.  Normal gait.  Sensation equal bilaterally in the lower extremities.  Psychiatric:        Speech: Speech normal.        Behavior: Behavior normal.     ED Results / Procedures / Treatments   Labs (all labs ordered are listed, but only abnormal results are displayed) Labs Reviewed - No data to  display  EKG None  Radiology DG Hip Unilat W or Wo Pelvis 2-3 Views Left  Result Date: 10/01/2022 CLINICAL DATA:  Injury yesterday while lifting with left hip pain. EXAM: DG HIP (WITH OR WITHOUT PELVIS) 2-3V LEFT COMPARISON:  None Available. FINDINGS: There are mild symmetric osteoarthritic changes of the hips. Mild degenerative change of the symphysis pubis joint. Mild degenerate change of the spine. No evidence of acute fracture or dislocation. IMPRESSION: 1. No acute findings. 2. Mild symmetric osteoarthritic changes of the hips. Electronically Signed   By: Elberta Fortis M.D.   On: 10/01/2022 09:19    Procedures Procedures    Medications Ordered in ED Medications - No data to display  ED Course/ Medical Decision Making/ A&P                             Medical Decision Making Amount and/or Complexity of Data Reviewed Radiology: ordered.   This patient presents to the ED for concern of back pain, this involves an extensive number of treatment options, and is a complaint that carries with it a high risk of complications and morbidity.  The differential diagnosis includes muscle strain, fracture, dislocation, cauda equina, others   Co morbidities that complicate the patient evaluation  Osteoporosis   Imaging Studies ordered:  Nontraumatic back pain, no red flag symptoms such as saddle anesthesia, urinary retention, urinary incontinence.  Symptoms ongoing for 3 days.  No indication for imaging at this time   Test / Admission - Considered:  Patient's presentation is consistent with a lumbar strain.  No weakness.  No neurologic symptoms appreciated.  Plan to discharge home on course of steroids and anti-inflammatory medications with recommendations for heat and Tylenol as needed.  Patient will follow-up with her primary care provider for further evaluation and management.  Return precautions provided.        Final Clinical Impression(s) / ED Diagnoses Final diagnoses:   Strain of lumbar region, initial encounter    Rx / DC Orders ED Discharge Orders          Ordered    methylPREDNISolone (MEDROL DOSEPAK) 4 MG TBPK tablet        10/01/22 0941    meloxicam (MOBIC) 15 MG tablet  Daily        10/01/22 0941              Darrick Grinder, PA-C 10/01/22 1610    Lorre Nick, MD 10/01/22 1146

## 2022-10-01 NOTE — ED Triage Notes (Signed)
Pt ambulatory to er, pt states that she was moving a case of water in the car yesterday and hurt her back, states that she also has a hx of sciatica. Pt denies numbness or tingling down in her legs.  Denies fall or trauma.

## 2022-10-01 NOTE — Discharge Instructions (Signed)
You were evaluated today with low back pain.  Your presentation is consistent with a lumbar strain.  Please take the prescribed steroid and anti-inflammatory medication.  While taking the meloxicam do not take any other NSAID medication.  This would include ibuprofen, naproxen.  You may continue to take Tylenol for breakthrough pain.  You may take a maximum of 1000 mg of Tylenol every 6 hours.  (2 extra strength tablets).  Please consider using ice or heat over the affected area.  Follow-up as needed with your primary care provider.

## 2022-10-02 LAB — CBC WITH DIFFERENTIAL/PLATELET
Absolute Monocytes: 455 cells/uL (ref 200–950)
Basophils Absolute: 79 cells/uL (ref 0–200)
Basophils Relative: 1.2 %
Eosinophils Absolute: 211 cells/uL (ref 15–500)
Eosinophils Relative: 3.2 %
HCT: 41.1 % (ref 35.0–45.0)
Hemoglobin: 13.5 g/dL (ref 11.7–15.5)
Lymphs Abs: 1551 cells/uL (ref 850–3900)
MCH: 28.3 pg (ref 27.0–33.0)
MCHC: 32.8 g/dL (ref 32.0–36.0)
MCV: 86.2 fL (ref 80.0–100.0)
MPV: 11.5 fL (ref 7.5–12.5)
Monocytes Relative: 6.9 %
Neutro Abs: 4303 cells/uL (ref 1500–7800)
Neutrophils Relative %: 65.2 %
Platelets: 321 10*3/uL (ref 140–400)
RBC: 4.77 10*6/uL (ref 3.80–5.10)
RDW: 12.2 % (ref 11.0–15.0)
Total Lymphocyte: 23.5 %
WBC: 6.6 10*3/uL (ref 3.8–10.8)

## 2022-10-02 LAB — COMPLETE METABOLIC PANEL WITH GFR
AG Ratio: 2 (calc) (ref 1.0–2.5)
ALT: 34 U/L — ABNORMAL HIGH (ref 6–29)
AST: 27 U/L (ref 10–35)
Albumin: 4.6 g/dL (ref 3.6–5.1)
Alkaline phosphatase (APISO): 82 U/L (ref 37–153)
BUN: 20 mg/dL (ref 7–25)
CO2: 25 mmol/L (ref 20–32)
Calcium: 10 mg/dL (ref 8.6–10.4)
Chloride: 106 mmol/L (ref 98–110)
Creat: 0.72 mg/dL (ref 0.50–1.05)
Globulin: 2.3 g/dL (calc) (ref 1.9–3.7)
Glucose, Bld: 103 mg/dL — ABNORMAL HIGH (ref 65–99)
Potassium: 4.5 mmol/L (ref 3.5–5.3)
Sodium: 141 mmol/L (ref 135–146)
Total Bilirubin: 1 mg/dL (ref 0.2–1.2)
Total Protein: 6.9 g/dL (ref 6.1–8.1)
eGFR: 90 mL/min/{1.73_m2} (ref >=60)

## 2022-10-02 LAB — LIPID PANEL
Cholesterol: 186 mg/dL (ref <200)
HDL: 57 mg/dL (ref >=50)
LDL Cholesterol (Calc): 108 mg/dL (calc) — ABNORMAL HIGH
Non-HDL Cholesterol (Calc): 129 mg/dL (calc) (ref <130)
Total CHOL/HDL Ratio: 3.3 (calc) (ref <5.0)
Triglycerides: 112 mg/dL (ref <150)

## 2022-10-02 LAB — HEMOGLOBIN A1C
Hgb A1c MFr Bld: 5.7 % of total Hgb — ABNORMAL HIGH (ref <5.7)
Mean Plasma Glucose: 117 mg/dL
eAG (mmol/L): 6.5 mmol/L

## 2022-10-06 ENCOUNTER — Encounter: Payer: Self-pay | Admitting: Adult Health

## 2022-10-06 ENCOUNTER — Telehealth: Payer: Self-pay

## 2022-10-06 ENCOUNTER — Ambulatory Visit: Payer: Medicare HMO | Admitting: Adult Health

## 2022-10-06 VITALS — BP 124/76 | HR 93 | Ht 62.0 in | Wt 160.0 lb

## 2022-10-06 DIAGNOSIS — N898 Other specified noninflammatory disorders of vagina: Secondary | ICD-10-CM

## 2022-10-06 DIAGNOSIS — R809 Proteinuria, unspecified: Secondary | ICD-10-CM | POA: Diagnosis not present

## 2022-10-06 DIAGNOSIS — Z90710 Acquired absence of both cervix and uterus: Secondary | ICD-10-CM

## 2022-10-06 DIAGNOSIS — N907 Vulvar cyst: Secondary | ICD-10-CM | POA: Diagnosis not present

## 2022-10-06 DIAGNOSIS — N9489 Other specified conditions associated with female genital organs and menstrual cycle: Secondary | ICD-10-CM

## 2022-10-06 DIAGNOSIS — N949 Unspecified condition associated with female genital organs and menstrual cycle: Secondary | ICD-10-CM

## 2022-10-06 DIAGNOSIS — N9089 Other specified noninflammatory disorders of vulva and perineum: Secondary | ICD-10-CM | POA: Diagnosis not present

## 2022-10-06 DIAGNOSIS — R350 Frequency of micturition: Secondary | ICD-10-CM | POA: Diagnosis not present

## 2022-10-06 MED ORDER — AQUAPHOR 3 IN 1 DIAPER RASH 15 % EX CREA: TOPICAL_CREAM | CUTANEOUS | Status: DC

## 2022-10-06 MED ORDER — FLUCONAZOLE 100 MG PO TABS: ORAL_TABLET | ORAL | 0 refills | Status: DC

## 2022-10-06 NOTE — Telephone Encounter (Signed)
        Patient  visited [facility] on 10/01/2022  for back pain.   Telephone encounter attempt :  1st  A HIPAA compliant voice message was left requesting a return call.  Instructed patient to call back at [phone].   [facility]leton [facility]  [facility] Population Health Community Resource Care Guide   [email or web address]  ?? [phone]   Website: [email or web address]  [email or web address]

## 2022-10-06 NOTE — Progress Notes (Signed)
  Subjective:     Patient ID: Taylor Singleton, female   DOB: 1956/05/31, 67 y.o.   MRN: [id number]  HPI Taylor Singleton is a 67 year old white female, married, sp hysterectomy, in complaining of vaginal and vulva irritation and  burning. Did seem to feel a little better after diflucan and if applies cold to area. She has tried Cortaid and Desitin with relief, and does not wear panties at home, and uses cotton pads.  She had poster repair with perineorrhaphy and sacrospinous ligament fixation 07/19/22 and has done well.  PCP is Dr Tanya Nones   Review of Systems +vaginal and vulva irritation and burning Has urinary frequency too Reviewed past medical,surgical, social and family history. Reviewed medications and allergies.     Objective:   Physical Exam BP 124/76 (BP Location: Left Arm, Patient Position: Sitting, Cuff Size: Normal)   Pulse 93   Ht  (1.575 m)   Wt 160 lb (72.6 kg)   BMI 29.26 kg/m  urine dipstick 1+ protein Skin warm and dry.Pelvic: external genitalia is normal in appearance, has multiple sebaceous cyst left labia, vulva red, vagina: pale, with loss of rugae and moisture,urethra has no lesions or masses noted, cervix and uterus are absent, adnexa: no masses or tenderness noted. Bladder is non tender and no masses felt. Painted vulva and vagina with gentian violet.  Fall risk is low  [facility]stream - 10/06/22 0947       Pregnancy Intention Screening   Does the patient want to become pregnant in the next year? N/A    Does the patient's partner want to become pregnant in the next year? N/A    Would the patient like to discuss contraceptive options today? N/A      Contraception Wrap Up   Current Method Female Sterilization   hyst   End Method Female Sterilization   hyst   Contraception Counseling Provided No            Examination chaperoned by Malachy Mood LPN     Assessment:     1. Vulvar irritation Painted vagina with gentian violet  Will rx diflucan 100 mg 1 daily for 14  days Can use aquaphor 3N1 prn   2. Vaginal irritation Painted vagina with gentian violet  Will rx diflucan 100 mg 1 daily for 14 days  3. Vaginal burning Painted vagina with gentian violet  Will rx diflucan 100 mg 1 daily for 14 days  Meds ordered this encounter  Medications   fluconazole (DIFLUCAN) 100 MG tablet    Sig: Take 1 tablet daily for 14 days, DO NOT take Lipitor while taking    Dispense:  14 tablet    Refill:  0    Order Specific Question:   Supervising Provider    Answer:   Duane Lope H [[id number]]   Zinc Oxide (AQUAPHOR 3 IN 1 DIAPER RASH) 15 % CREA    Sig: Use as needed to vulva area    Order Specific Question:   Supervising Provider    Answer:   Duane Lope H [[id number]]     4. Urinary frequency Wil check urine culture to rule out UTI - Urine Culture  5. S/P hysterectomy  6. Sebaceous cyst of labia Leave alone  7. Proteinuria, unspecified type 1+ protein on urine dipstick  Will check culture for any growth  - Urine Culture     Plan:     Follow up Monday as scheduled with Dr Charlotta Newton

## 2022-10-07 ENCOUNTER — Encounter: Payer: Self-pay | Admitting: Family Medicine

## 2022-10-07 ENCOUNTER — Ambulatory Visit (INDEPENDENT_AMBULATORY_CARE_PROVIDER_SITE_OTHER): Payer: Medicare HMO | Admitting: Family Medicine

## 2022-10-07 ENCOUNTER — Telehealth: Payer: Self-pay

## 2022-10-07 VITALS — BP 128/82 | HR 85 | Temp 98.4°F | Ht 62.0 in | Wt 158.0 lb

## 2022-10-07 DIAGNOSIS — L821 Other seborrheic keratosis: Secondary | ICD-10-CM | POA: Diagnosis not present

## 2022-10-07 DIAGNOSIS — L989 Disorder of the skin and subcutaneous tissue, unspecified: Secondary | ICD-10-CM | POA: Diagnosis not present

## 2022-10-07 NOTE — Telephone Encounter (Signed)
        Patient  visited [facility] on 10/01/2022  for back pain.   Telephone encounter attempt :  2nd  A HIPAA compliant voice message was left requesting a return call.  Instructed patient to call back at [phone].   Zerek Litsey [facility]  [facility] Population Health Community Resource Care Guide   [email or web address]  ?? [phone]   Website: [email or web address]  [email or web address]

## 2022-10-07 NOTE — Progress Notes (Signed)
Subjective:    Patient ID: Taylor Singleton, female    DOB: 1955-12-29, 67 y.o.   MRN: [id number]   Patient has several large seborrheic keratoses all over her body however in her left axilla there are 3 large brown wartlike papules each 1 cm in diameter.  She would like me to treat these with liquid nitrogen cryotherapy.  She also has a 2 cm seborrheic keratosis on her right gluteus that she would like me to treat with liquid nitrogen cryotherapy.  There is also a red flesh-colored papule that looks like a sessile mound.  He thinks he may have a iridescent.  I am concerned that this may be a basal cell cancer on her left shoulder.  This is 5 mm in diameter.  We decided to treat each of these lesions with liquid and cryotherapy Past Medical History:  Diagnosis Date  . Arthritis   . Chronic idiopathic constipation   . GERD (gastroesophageal reflux disease)   . HLD (hyperlipidemia)   . Hyperlipidemia   . Osteoporosis   . Prolapse of posterior vaginal wall   . Sciatica   . Vaginal vault 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   . Varicose vein of leg   . Wears glasses    Past Surgical History:  Procedure Laterality Date  . ANTERIOR AND POSTERIOR REPAIR WITH SACROSPINOUS FIXATION N/A 07/19/2022   Procedure: POSTERIOR REPAIR WITHP PERINEORRHAPHY AND  SACROSPINOUS FIXATION;  Surgeon: Marguerita Beards, MD;  Location: [facility];  Service: Gynecology;  Laterality: N/A;  Total time requested is 1.5hrs  . BREAST CYST EXCISION Left   . CHOLECYSTECTOMY, LAPAROSCOPIC  2010  . TOTAL ABDOMINAL HYSTERECTOMY W/ BILATERAL SALPINGOOPHORECTOMY     1990s   Current Outpatient Medications on File Prior to Visit  Medication Sig Dispense Refill  . alendronate (FOSAMAX) 70 MG tablet TAKE 1 TABLET BY MOUTH EVERY 7 DAYS. TAKE WITH A FULL GLASS OF WATER ON AN EMPTY STOMACH. 12 tablet 3  . atorvastatin (LIPITOR) 40 MG tablet Take 1 tablet (40 mg total) by mouth daily. (Patient taking differently: Take 40 mg  by mouth daily.) 90 tablet 3  . Calcium Carb-Cholecalciferol (CALCIUM/VITAMIN D) 600-400 MG-UNIT TABS Take 2 tablets by mouth daily.    . fluconazole (DIFLUCAN) 100 MG tablet Take 1 tablet daily for 14 days, DO NOT take Lipitor while taking 14 tablet 0  . meloxicam (MOBIC) 15 MG tablet Take 1 tablet (15 mg total) by mouth daily. 30 tablet 0  . Omega-3 Fatty Acids (FISH OIL OMEGA-3 PO) Take by mouth daily.    Marland Kitchen OVER THE COUNTER MEDICATION Take 1 tablet by mouth daily. gummy    . pantoprazole (PROTONIX) 40 MG tablet Take 1 tablet (40 mg total) by mouth daily. (Patient taking differently: Take 40 mg by mouth daily.) 90 tablet 1  . Polyethylene Glycol 3350 (MIRALAX PO) Take by mouth daily.    . Zinc Oxide (AQUAPHOR 3 IN 1 DIAPER RASH) 15 % CREA Use as needed to vulva area     No current facility-administered medications on file prior to visit.   No Known Allergies Social History   Socioeconomic History  . Marital status: Married    Spouse name: Not on file  . Number of children: 2  . Years of education: Not on file  . Highest education level: Not on file  Occupational History  . Not on file  Tobacco Use  . Smoking status: Never  . Smokeless tobacco: Never  Vaping Use  .  Vaping Use: Never used  Substance and Sexual Activity  . Alcohol use: No  . Drug use: Never  . Sexual activity: Not Currently    Birth control/protection: Surgical    Comment: hyst  Other Topics Concern  . Not on file  Social History Narrative  . Not on file   Social Determinants of Health   Financial Resource Strain: Low Risk  (05/25/2022)   Overall Financial Resource Strain (CARDIA)   . Difficulty of Paying Living Expenses: Not hard at all  Food Insecurity: No Food Insecurity (05/25/2022)   Hunger Vital Sign   . Worried About Programme researcher, broadcasting/film/video in the Last Year: Never true   . Ran Out of Food in the Last Year: Never true  Transportation Needs: No Transportation Needs (05/25/2022)   PRAPARE -  Transportation   . Lack of Transportation (Medical): No   . Lack of Transportation (Non-Medical): No  Physical Activity: Insufficiently Active (05/25/2022)   Exercise Vital Sign   . Days of Exercise per Week: 3 days   . Minutes of Exercise per Session: 30 min  Stress: No Stress Concern Present (05/25/2022)   [employer] of Occupational Health - Occupational Stress Questionnaire   . Feeling of Stress : Not at all  Social Connections: Moderately Isolated (05/25/2022)   Social Connection and Isolation Panel [NHANES]   . Frequency of Communication with Friends and Family: Twice a week   . Frequency of Social Gatherings with Friends and Family: Three times a week   . Attends Religious Services: Never   . Active Member of Clubs or Organizations: No   . Attends Banker Meetings: Never   . Marital Status: Married  Catering manager Violence: Not At Risk (05/25/2022)   Humiliation, Afraid, Rape, and Kick questionnaire   . Fear of Current or Ex-Partner: No   . Emotionally Abused: No   . Physically Abused: No   . Sexually Abused: No     Review of Systems  All other systems reviewed and are negative.      Objective:   Physical Exam Constitutional:      General: She is not in acute distress.    Appearance: Normal appearance. She is well-developed and normal weight. She is not ill-appearing or toxic-appearing.  Cardiovascular:     Rate and Rhythm: Normal rate and regular rhythm.     Heart sounds: Normal heart sounds.  Pulmonary:     Effort: Pulmonary effort is normal. No respiratory distress.     Breath sounds: No wheezing or rales.  Chest:    Musculoskeletal:       Arms:     Cervical back: Neck supple.       Legs:  Lymphadenopathy:     Cervical: No cervical adenopathy.  Neurological:     Mental Status: She is alert and oriented to person, place, and time.     Cranial Nerves: No cranial nerve deficit, dysarthria or facial asymmetry.     Sensory: No sensory  deficit.     Motor: No weakness, tremor, atrophy, abnormal muscle tone or seizure activity.     Coordination: Romberg sign negative. Coordination normal. Finger-Nose-Finger Test normal.     Gait: Gait normal.     Deep Tendon Reflexes: Reflexes normal.  Psychiatric:        Mood and Affect: Mood normal.        Behavior: Behavior normal.        Thought Content: Thought content normal.  Judgment: Judgment normal.          Assessment & Plan:  Seborrheic keratoses  Skin lesion of left arm The brown wartlike papules on the left axilla and on the right gluteus are seborrheic keratoses.  I treated each of these lesions with liquid nitrogen cryotherapy for about 30 seconds.  The lesion on the left shoulder could either be an erythematous dermal mole or possibly a basal cell carcinoma.  Treated with liquid Tradjenta 5 therapy 30 g.  If it persist I would recommend an excisional biopsy

## 2022-10-09 LAB — URINE CULTURE

## 2022-10-11 ENCOUNTER — Ambulatory Visit: Payer: Medicare HMO | Admitting: Obstetrics & Gynecology

## 2022-10-11 ENCOUNTER — Other Ambulatory Visit (HOSPITAL_COMMUNITY)
Admission: RE | Admit: 2022-10-11 | Discharge: 2022-10-11 | Disposition: A | Discharge status: Home / Self Care | Payer: Medicare HMO | Source: Ambulatory Visit | Attending: Obstetrics & Gynecology | Admitting: Obstetrics & Gynecology

## 2022-10-11 ENCOUNTER — Encounter: Payer: Self-pay | Admitting: Obstetrics & Gynecology

## 2022-10-11 VITALS — BP 131/85 | HR 82 | Ht 61.0 in | Wt 159.2 lb

## 2022-10-11 DIAGNOSIS — N952 Postmenopausal atrophic vaginitis: Secondary | ICD-10-CM

## 2022-10-11 DIAGNOSIS — N898 Other specified noninflammatory disorders of vagina: Secondary | ICD-10-CM | POA: Diagnosis not present

## 2022-10-11 MED ORDER — ESTRADIOL 0.1 MG/GM VA CREA: TOPICAL_CREAM | VAGINAL | 6 refills | Status: DC

## 2022-10-11 NOTE — Progress Notes (Signed)
GYN VISIT Patient name: Taylor Singleton MRN [id number]  Date of birth: 11/24/1955 Chief Complaint:   Vaginal Irritation  History of Present Illness:   Taylor Singleton is a 67 y.o. G2P2002 PM, PH female being seen today for the following concerns: -Vaginal irritation: This has been an ongoing issue since February.  Initially she was seen by urogynecology and treated for a yeast infection; however vaginitis panel was negative.  She has also tried Aquaphor and Vaseline with some but not complete resolution of her symptoms.  Denies itching, just feels a burning sensation internally.  Currently on Diflucan x 3 doses and noted immediate improvement.  Denies discharge or odor.  Just irritation.  Additionally when she has these symptoms, notes urinary frequency.  Typically up about 4x at night to void.   -h/o POP- repair completed with Dr. Florian Buff 07/2022.    No LMP recorded. Patient has had a hysterectomy.     10/07/2022   12:08 PM 05/25/2022    9:05 AM 02/03/2021    9:20 AM 11/28/2020    8:30 AM 08/25/2020   10:39 AM  Depression screen PHQ 2/9  Decreased Interest 0 0 0 0 0  Down, Depressed, Hopeless 0 0 0 0 0  PHQ - 2 Score 0 0 0 0 0  Altered sleeping     0  Tired, decreased energy     0  Change in appetite     0  Feeling bad or failure about yourself      0  Trouble concentrating     0  Moving slowly or fidgety/restless     0  Suicidal thoughts     0  PHQ-9 Score     0     Review of Systems:   Pertinent items are noted in HPI Denies fever/chills, dizziness, headaches, visual disturbances, fatigue, shortness of breath, chest pain, abdominal pain, vomiting. Pertinent History Reviewed:  Reviewed past medical,surgical, social, obstetrical and family history.  Reviewed problem list, medications and allergies. Physical Assessment:   Vitals:   10/11/22 1519  BP: 131/85  Pulse: 82  Weight: 159 lb 3.2 oz (72.2 kg)  Height: 5\' 1"  (1.549 m)  Body mass index is 30.08 kg/m.        Physical Examination:   General appearance: alert, well appearing, and in no distress  Psych: mood appropriate, normal affect  Skin: warm & dry   Cardiovascular: normal heart rate noted  Respiratory: normal respiratory effort, no distress  Abdomen: soft, non-tender   Pelvic: VULVA: normal appearing vulva, minimal erythema labia minora. Left labia majora with several small sebaceous cyst (not bothersome too patient.  Slight thick white discharge noted at labial folds VAGINA: normal appearing vagina with normal color and discharge, no lesions.  Uterus and cervix surgically absent.  Patient points to internally as source of irritation  Extremities: no edema   Chaperone: Faith Rogue    Assessment & Plan:  1) Vaginal irritation -Etiology of irritation remains unclear, will repeat vaginitis panel although previously negative -Records reviewed -Patient to complete Diflucan course x 3 -Will start on vaginal estrogen therapy nightly use x 2 weeks then twice per week -Plan to follow-up in 2 to 3 months  Meds ordered this encounter  Medications   estradiol (ESTRACE VAGINAL) 0.1 MG/GM vaginal cream    Sig: Pea-sized amount twice per week    Dispense:  42.5 g    Refill:  6      Return in about 2 months (around 12/11/2022) for  medication follow up.   Myna Hidalgo, DO Attending Obstetrician & Gynecologist, [facility] for [employer], [facility]

## 2022-10-13 ENCOUNTER — Other Ambulatory Visit: Payer: Self-pay | Admitting: Obstetrics & Gynecology

## 2022-10-13 DIAGNOSIS — B9689 Other specified bacterial agents as the cause of diseases classified elsewhere: Secondary | ICD-10-CM

## 2022-10-13 LAB — CERVICOVAGINAL ANCILLARY ONLY
Bacterial Vaginitis (gardnerella): POSITIVE — AB
Candida Glabrata: NEGATIVE
Candida Vaginitis: NEGATIVE
Comment: NEGATIVE
Comment: NEGATIVE
Comment: NEGATIVE

## 2022-10-13 MED ORDER — METRONIDAZOLE 500 MG PO TABS: 500.0000 mg | ORAL_TABLET | Freq: Two times a day (BID) | ORAL | 0 refills | Status: AC

## 2022-10-13 NOTE — Progress Notes (Signed)
Rx sent in  Myna Hidalgo, DO Attending Obstetrician & Gynecologist, [facility] for [employer], [facility]

## 2022-11-02 ENCOUNTER — Telehealth: Payer: Self-pay | Admitting: Physical Medicine and Rehabilitation

## 2022-11-02 DIAGNOSIS — M5416 Radiculopathy, lumbar region: Secondary | ICD-10-CM

## 2022-11-02 NOTE — Telephone Encounter (Signed)
Patient asking to get an injection. Please advise

## 2022-11-04 NOTE — Telephone Encounter (Signed)
Spoke with patient and she is requesting an injection. She states the pain is in the the same spot, just worse. She has a trip planned on 11/24/22 and wants to know if she can have it done before then. Please advise

## 2022-11-05 NOTE — Addendum Note (Signed)
Addended by: Sharlet Salina on: 11/05/2022 08:57 AM   Modules accepted: Orders

## 2022-11-23 ENCOUNTER — Other Ambulatory Visit: Payer: Self-pay

## 2022-11-23 ENCOUNTER — Ambulatory Visit: Payer: Medicare HMO | Admitting: Physical Medicine and Rehabilitation

## 2022-11-23 VITALS — BP 134/82 | HR 83

## 2022-11-23 DIAGNOSIS — M5416 Radiculopathy, lumbar region: Secondary | ICD-10-CM

## 2022-11-23 MED ORDER — METHYLPREDNISOLONE ACETATE 80 MG/ML IJ SUSP
80.0000 mg | Freq: Once | INTRAMUSCULAR | Status: AC
  Administered 2022-11-23: 80 mg

## 2022-11-23 NOTE — Progress Notes (Signed)
Functional Pain Scale - descriptive words and definitions  Distracting (5)    Aware of pain/able to complete some ADL's but limited by pain/sleep is affected and active distractions are only slightly useful. Moderate range order  Average Pain 7   +Driver, -BT, -Dye Allergies.  Lower back pain on left side that radiates down the left leg. Sitting and standing too long makes pain worse

## 2022-11-23 NOTE — Patient Instructions (Signed)

## 2022-12-03 ENCOUNTER — Other Ambulatory Visit: Payer: Self-pay | Admitting: Family Medicine

## 2022-12-06 DIAGNOSIS — R69 Illness, unspecified: Secondary | ICD-10-CM | POA: Diagnosis not present

## 2022-12-06 NOTE — Procedures (Signed)
Lumbar Epidural Steroid Injection - Interlaminar Approach with Fluoroscopic Guidance  Patient: Taylor Singleton      Date of Birth: 1955/12/12 MRN: [id number] PCP: Donita Brooks, MD      Visit Date: 11/23/2022   Universal Protocol:     Consent Given By: the patient  Position: PRONE  Additional Comments: Vital signs were monitored before and after the procedure. Patient was prepped and draped in the usual sterile fashion. The correct patient, procedure, and site was verified.   Injection Procedure Details:   Procedure diagnoses: Lumbar radiculopathy [M54.16]   Meds Administered:  Meds ordered this encounter  Medications   methylPREDNISolone acetate (DEPO-MEDROL) injection 80 mg     Laterality: Left  Location/Site:  L5-S1  Needle: 3.5 in., 20 ga. Tuohy  Needle Placement: Paramedian epidural  Findings:   -Comments: Excellent flow of contrast into the epidural space.  Procedure Details: Using a paramedian approach from the side mentioned above, the region overlying the inferior lamina was localized under fluoroscopic visualization and the soft tissues overlying this structure were infiltrated with 4 ml. of 1% Lidocaine without Epinephrine. The Tuohy needle was inserted into the epidural space using a paramedian approach.   The epidural space was localized using loss of resistance along with counter oblique bi-planar fluoroscopic views.  After negative aspirate for air, blood, and CSF, a 2 ml. volume of Isovue-250 was injected into the epidural space and the flow of contrast was observed. Radiographs were obtained for documentation purposes.    The injectate was administered into the level noted above.   Additional Comments:  No complications occurred Dressing: 2 x 2 sterile gauze and Band-Aid    Post-procedure details: Patient was observed during the procedure. Post-procedure instructions were reviewed.  Patient left the clinic in stable condition.

## 2022-12-06 NOTE — Progress Notes (Signed)
Taylor Singleton - 67 y.o. female MRN [id number]  Date of birth: [birth year]  Office Visit Note: Visit Date: 11/23/2022 PCP: Donita Brooks, MD Referred by: Donita Brooks, MD  Subjective: Chief Complaint  Patient presents with   Lower Back - Pain   HPI:  Taylor Singleton is a 67 y.o. female who comes in today at the request of Ellin Goodie, FNP for planned Left L5-S1 Lumbar Interlaminar epidural steroid injection with fluoroscopic guidance.  The patient has failed conservative care including home exercise, medications, time and activity modification.  This injection will be diagnostic and hopefully therapeutic.  Please see requesting physician notes for further details and justification.   ROS Otherwise per HPI.  Assessment & Plan: Visit Diagnoses:    ICD-10-CM   1. Lumbar radiculopathy  M54.16 XR C-ARM NO REPORT    Epidural Steroid injection    methylPREDNISolone acetate (DEPO-MEDROL) injection 80 mg      Plan: No additional findings.   Meds & Orders:  Meds ordered this encounter  Medications   methylPREDNISolone acetate (DEPO-MEDROL) injection 80 mg    Orders Placed This Encounter  Procedures   XR C-ARM NO REPORT   Epidural Steroid injection    Follow-up: Return for visit to requesting provider as needed.   Procedures: No procedures performed  Lumbar Epidural Steroid Injection - Interlaminar Approach with Fluoroscopic Guidance  Patient: Taylor Singleton      Date of Birth: [birth year] MRN: [id number] PCP: Donita Brooks, MD      Visit Date: 11/23/2022   Universal Protocol:     Consent Given By: the patient  Position: PRONE  Additional Comments: Vital signs were monitored before and after the procedure. Patient was prepped and draped in the usual sterile fashion. The correct patient, procedure, and site was verified.   Injection Procedure Details:   Procedure diagnoses: Lumbar radiculopathy [M54.16]   Meds Administered:  Meds ordered this encounter   Medications   methylPREDNISolone acetate (DEPO-MEDROL) injection 80 mg     Laterality: Left  Location/Site:  L5-S1  Needle: 3.5 in., 20 ga. Tuohy  Needle Placement: Paramedian epidural  Findings:   -Comments: Excellent flow of contrast into the epidural space.  Procedure Details: Using a paramedian approach from the side mentioned above, the region overlying the inferior lamina was localized under fluoroscopic visualization and the soft tissues overlying this structure were infiltrated with 4 ml. of 1% Lidocaine without Epinephrine. The Tuohy needle was inserted into the epidural space using a paramedian approach.   The epidural space was localized using loss of resistance along with counter oblique bi-planar fluoroscopic views.  After negative aspirate for air, blood, and CSF, a 2 ml. volume of Isovue-250 was injected into the epidural space and the flow of contrast was observed. Radiographs were obtained for documentation purposes.    The injectate was administered into the level noted above.   Additional Comments:  No complications occurred Dressing: 2 x 2 sterile gauze and Band-Aid    Post-procedure details: Patient was observed during the procedure. Post-procedure instructions were reviewed.  Patient left the clinic in stable condition.   Clinical History: IMPRESSION: MR THORACIC SPINE IMPRESSION   1. Flowing anterior osteophytes with partial fusion of the T5 through T7 vertebral bodies. 2. Trace anterolisthesis of T2 on T3, T3 on T4, and T4 on T5. 3. Otherwise, mild degenerative changes as detailed above without high-grade spinal canal or neural foraminal stenosis.   MR LUMBAR SPINE IMPRESSION   1. Degenerative changes in the  lumbar spine are most advanced at L4-L5 where there is mild-to-moderate spinal canal stenosis with crowding of the subarticular zones with possible mass effect on the traversing right L5 nerve root, and moderate to severe right and mild  left neural foraminal stenosis. 2. Mild crowding of the left subarticular zone and mild-to-moderate left neural foraminal stenosis at T12-L1. No evidence of nerve root impingement 3. Crowding of the left subarticular zone without definite evidence of nerve root impingement and mild left worse than right neural foraminal stenosis at L3-L4. 4. Multilevel facet arthropathy, most advanced at L4-L5 and L5-S1, slightly worse on the left. Mild enlargement of the common bile duct measuring up to 1.1 cm. Correlate with LFTs, and consider dedicated MRCP as indicated.     Electronically Signed   By: Lesia Hausen M.D.   On: 04/28/2021 14:53     Objective:  VS:  HT:    WT:   BMI:     BP:134/82  HR:83bpm  TEMP: ( )  RESP:  Physical Exam Vitals and nursing note reviewed.  Constitutional:      General: She is not in acute distress.    Appearance: Normal appearance. She is not ill-appearing.  HENT:     Head: Normocephalic and atraumatic.     Right Ear: External ear normal.     Left Ear: External ear normal.  Eyes:     Extraocular Movements: Extraocular movements intact.  Cardiovascular:     Rate and Rhythm: Normal rate.     Pulses: Normal pulses.  Pulmonary:     Effort: Pulmonary effort is normal. No respiratory distress.  Abdominal:     General: There is no distension.     Palpations: Abdomen is soft.  Musculoskeletal:        General: Tenderness present.     Cervical back: Neck supple.     Right lower leg: No edema.     Left lower leg: No edema.     Comments: Patient has good distal strength with no pain over the greater trochanters.  No clonus or focal weakness.  Skin:    Findings: No erythema, lesion or rash.  Neurological:     General: No focal deficit present.     Mental Status: She is alert and oriented to person, place, and time.     Sensory: No sensory deficit.     Motor: No weakness or abnormal muscle tone.     Coordination: Coordination normal.  Psychiatric:         Mood and Affect: Mood normal.        Behavior: Behavior normal.      Imaging: No results found.

## 2022-12-23 ENCOUNTER — Ambulatory Visit (INDEPENDENT_AMBULATORY_CARE_PROVIDER_SITE_OTHER): Payer: Medicare HMO | Admitting: Family Medicine

## 2022-12-23 ENCOUNTER — Encounter: Payer: Self-pay | Admitting: Family Medicine

## 2022-12-23 VITALS — BP 120/68 | HR 87 | Temp 98.3°F | Ht 61.0 in | Wt 162.8 lb

## 2022-12-23 DIAGNOSIS — L989 Disorder of the skin and subcutaneous tissue, unspecified: Secondary | ICD-10-CM | POA: Diagnosis not present

## 2022-12-23 DIAGNOSIS — L821 Other seborrheic keratosis: Secondary | ICD-10-CM

## 2022-12-23 NOTE — Progress Notes (Signed)
Subjective:    Patient ID: Taylor Singleton, female    DOB: 1956/04/03, 67 y.o.   MRN: [id number]   Patient has several large seborrheic keratoses all over her body however in her left axilla there is a complex.  It is just above her bra strap.  It is approximately 2 seborrheic keratoses and 2 very small ones that have coalesced into 1 large patch that is roughly 1.5 cm in diameter.  She would like me to treat this today.  Also on the lateral left deltoid, there is an erythematous scaly patch with irregular shape and fine white silvery scale approximately 1 cm in diameter.  I am concerned that this could be keratosis.  She is asking me to treat this as well.  She also reports insomnia and chronic dry mouth. 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    Varicose vein of leg    Wears glasses    Past Surgical History:  Procedure Laterality Date   ANTERIOR AND POSTERIOR REPAIR WITH SACROSPINOUS FIXATION N/A 07/19/2022   Procedure: POSTERIOR REPAIR WITHP PERINEORRHAPHY AND  SACROSPINOUS FIXATION;  Surgeon: Marguerita Beards, MD;  Location: [facility];  Service: Gynecology;  Laterality: N/A;  Total time requested is 1.5hrs   BREAST CYST EXCISION Left    CHOLECYSTECTOMY, LAPAROSCOPIC  2010   TOTAL ABDOMINAL HYSTERECTOMY W/ BILATERAL SALPINGOOPHORECTOMY     1990s   Current Outpatient Medications on File Prior to Visit  Medication Sig Dispense Refill   atorvastatin (LIPITOR) 40 MG tablet TAKE 1 TABLET BY MOUTH EVERY DAY 30 tablet 0   Calcium Carb-Cholecalciferol (CALCIUM/VITAMIN D) 600-400 MG-UNIT TABS Take 2 tablets by mouth daily.     estradiol (ESTRACE VAGINAL) 0.1 MG/GM vaginal cream Pea-sized amount twice per week 42.5 g 6   Omega-3 Fatty  Acids (FISH OIL OMEGA-3 PO) Take by mouth daily.     OVER THE COUNTER MEDICATION Take 1 tablet by mouth daily. gummy     pantoprazole (PROTONIX) 40 MG tablet Take 1 tablet (40 mg total) by mouth daily. (Patient taking differently: Take 40 mg by mouth daily.) 90 tablet 1   Polyethylene Glycol 3350 (MIRALAX PO) Take by mouth daily.     Zinc Oxide (AQUAPHOR 3 IN 1 DIAPER RASH) 15 % CREA Use as needed to vulva area     alendronate (FOSAMAX) 70 MG tablet TAKE 1 TABLET BY MOUTH EVERY 7 DAYS. TAKE WITH A FULL GLASS OF WATER ON AN EMPTY STOMACH. (Patient not taking: Reported on 10/11/2022) 12 tablet 3   No current facility-administered medications on file prior to visit.   No Known Allergies Social History   Socioeconomic History   Marital status: Married    Spouse name: Not on file   Number of children: 2   Years of education: Not on file   Highest education level: Not on file  Occupational History   Not on file  Tobacco Use   Smoking status: Never   Smokeless tobacco: Never  Vaping Use   Vaping status: Never Used  Substance and Sexual Activity   Alcohol use: No   Drug use: Never   Sexual activity: Not Currently    Birth control/protection: Surgical    Comment:  hyst  Other Topics Concern   Not on file  Social History Narrative   Not on file   Social Determinants of Health   Financial Resource Strain: Low Risk  (05/25/2022)   Overall Financial Resource Strain (CARDIA)    Difficulty of Paying Living Expenses: Not hard at all  Food Insecurity: No Food Insecurity (05/25/2022)   Hunger Vital Sign    Worried About Running Out of Food in the Last Year: Never true    Ran Out of Food in the Last Year: Never true  Transportation Needs: No Transportation Needs (05/25/2022)   PRAPARE - Administrator, Civil Service (Medical): No    Lack of Transportation (Non-Medical): No  Physical Activity: Insufficiently Active (05/25/2022)   Exercise Vital Sign    Days of Exercise per  Week: 3 days    Minutes of Exercise per Session: 30 min  Stress: No Stress Concern Present (05/25/2022)   [employer] of Occupational Health - Occupational Stress Questionnaire    Feeling of Stress : Not at all  Social Connections: Moderately Isolated (05/25/2022)   Social Connection and Isolation Panel [NHANES]    Frequency of Communication with Friends and Family: Twice a week    Frequency of Social Gatherings with Friends and Family: Three times a week    Attends Religious Services: Never    Active Member of Clubs or Organizations: No    Attends Banker Meetings: Never    Marital Status: Married  Catering manager Violence: Not At Risk (05/25/2022)   Humiliation, Afraid, Rape, and Kick questionnaire    Fear of Current or Ex-Partner: No    Emotionally Abused: No    Physically Abused: No    Sexually Abused: No     Review of Systems  All other systems reviewed and are negative.      Objective:   Physical Exam Constitutional:      General: She is not in acute distress.    Appearance: Normal appearance. She is well-developed and normal weight. She is not ill-appearing or toxic-appearing.  Cardiovascular:     Rate and Rhythm: Normal rate and regular rhythm.     Heart sounds: Normal heart sounds.  Pulmonary:     Effort: Pulmonary effort is normal. No respiratory distress.     Breath sounds: No wheezing or rales.  Chest:    Musculoskeletal:       Arms:     Cervical back: Neck supple.       Legs:  Lymphadenopathy:     Cervical: No cervical adenopathy.  Neurological:     Mental Status: She is alert and oriented to person, place, and time.     Cranial Nerves: No cranial nerve deficit, dysarthria or facial asymmetry.     Sensory: No sensory deficit.     Motor: No weakness, tremor, atrophy, abnormal muscle tone or seizure activity.     Coordination: Romberg sign negative. Coordination normal. Finger-Nose-Finger Test normal.     Gait: Gait normal.      Deep Tendon Reflexes: Reflexes normal.  Psychiatric:        Mood and Affect: Mood normal.        Behavior: Behavior normal.        Thought Content: Thought content normal.        Judgment: Judgment normal.           Assessment & Plan:  Seborrheic keratoses  Skin lesion of left arm  The brown wartlike papules in the left  axilla are seborrheic keratoses.  I treated the complex with liquid nitrogen cryotherapy for about 30 seconds.  The lesion on the left shoulder could either be an erythematous dermal mole or possibly a basal cell carcinoma.  Treated with liquid nitrogen cryotherapy for 30 secs. recommended lifestyle changes for chronic insomnia including no caffeine after 5 PM, exercising between 3 and 7 PM, white noise in the bedroom, no TV or electronics in the bedroom, fan in the bedroom.  If not improving will try trazodone.

## 2023-01-01 ENCOUNTER — Other Ambulatory Visit: Payer: Self-pay | Admitting: Family Medicine

## 2023-01-03 NOTE — Telephone Encounter (Signed)
Requested medication (s) are due for refill today - yes  Requested medication (s) are on the active medication list -yes  Future visit scheduled -no  Last refill: 12/03/22 #30  Notes to clinic: Lab 10/01/22, OV 12/23/22- no notes regarding lab results- sent for review   Requested Prescriptions  Pending Prescriptions Disp Refills   atorvastatin (LIPITOR) 40 MG tablet [Pharmacy Med Name: ATORVASTATIN 40 MG TABLET] 30 tablet 0    Sig: TAKE 1 TABLET BY MOUTH EVERY DAY     Cardiovascular:  Antilipid - Statins Failed - 01/01/2023  9:21 AM      Failed - Valid encounter within last 12 months    Recent Outpatient Visits           1 year ago Vertigo   [facility] Pickard, Priscille Heidelberg, MD   1 year ago Acute otalgia, right   [facility] Pickard, Priscille Heidelberg, MD   1 year ago Pure hypercholesterolemia   [facility] Pickard, Priscille Heidelberg, MD   1 year ago Seborrheic keratoses   [facility] Donita Brooks, MD   1 year ago Chronic left-sided low back pain with left-sided sciatica   [facility] Donita Brooks, MD              Failed - Lipid Panel in normal range within the last 12 months    Cholesterol  Date Value Ref Range Status  10/01/2022 186 <200 mg/dL Final   LDL Cholesterol (Calc)  Date Value Ref Range Status  10/01/2022 108 (H) mg/dL (calc) Final    Comment:    Reference range: <100 . Desirable range <100 mg/dL for primary prevention;   <70 mg/dL for patients with CHD or diabetic patients  with > or = 2 CHD risk factors. Marland Kitchen LDL-C is now calculated using the Martin-Hopkins  calculation, which is a validated novel method providing  better accuracy than the Friedewald equation in the  estimation of LDL-C.  Horald Pollen et al. Lenox Ahr. 9629;528(41): 2061-2068  (http://education.[facility].com/faq/FAQ164)    HDL  Date Value Ref Range Status  10/01/2022 57 > OR = 50 mg/dL Final   Triglycerides   Date Value Ref Range Status  10/01/2022 112 <150 mg/dL Final         Passed - Patient is not pregnant         Requested Prescriptions  Pending Prescriptions Disp Refills   atorvastatin (LIPITOR) 40 MG tablet [Pharmacy Med Name: ATORVASTATIN 40 MG TABLET] 30 tablet 0    Sig: TAKE 1 TABLET BY MOUTH EVERY DAY     Cardiovascular:  Antilipid - Statins Failed - 01/01/2023  9:21 AM      Failed - Valid encounter within last 12 months    Recent Outpatient Visits           1 year ago Vertigo   [facility] Donita Brooks, MD   1 year ago Acute otalgia, right   [facility] Pickard, Priscille Heidelberg, MD   1 year ago Pure hypercholesterolemia   [facility] Pickard, Priscille Heidelberg, MD   1 year ago Seborrheic keratoses   [facility] Donita Brooks, MD   1 year ago Chronic left-sided low back pain with left-sided sciatica   [facility] Donita Brooks, MD              Failed - Lipid Panel in normal range within  the last 12 months    Cholesterol  Date Value Ref Range Status  10/01/2022 186 <200 mg/dL Final   LDL Cholesterol (Calc)  Date Value Ref Range Status  10/01/2022 108 (H) mg/dL (calc) Final    Comment:    Reference range: <100 . Desirable range <100 mg/dL for primary prevention;   <70 mg/dL for patients with CHD or diabetic patients  with > or = 2 CHD risk factors. Marland Kitchen LDL-C is now calculated using the Martin-Hopkins  calculation, which is a validated novel method providing  better accuracy than the Friedewald equation in the  estimation of LDL-C.  Horald Pollen et al. Lenox Ahr. 4010;272(53): 2061-2068  (http://education.[facility].com/faq/FAQ164)    HDL  Date Value Ref Range Status  10/01/2022 57 > OR = 50 mg/dL Final   Triglycerides  Date Value Ref Range Status  10/01/2022 112 <150 mg/dL Final         Passed - Patient is not pregnant

## 2023-01-06 ENCOUNTER — Telehealth: Payer: Self-pay | Admitting: Physical Medicine and Rehabilitation

## 2023-01-06 NOTE — Telephone Encounter (Signed)
Patient called wanting to speak with you about needing a referral for another [phone]

## 2023-01-07 NOTE — Telephone Encounter (Signed)
Spoke with patient and she needed the records sent to Dr. Yetta Barre' office. Records were sent and she has an appointment scheduled

## 2023-01-11 ENCOUNTER — Other Ambulatory Visit: Payer: Self-pay | Admitting: Family Medicine

## 2023-01-11 DIAGNOSIS — K224 Dyskinesia of esophagus: Secondary | ICD-10-CM

## 2023-01-11 DIAGNOSIS — K219 Gastro-esophageal reflux disease without esophagitis: Secondary | ICD-10-CM

## 2023-01-11 NOTE — Telephone Encounter (Signed)
Prescription Request  01/11/2023  LOV: 12/23/2022  What is the name of the medication or equipment?   pantoprazole (PROTONIX) 40 MG tablet   Have you contacted your pharmacy to request a refill? Yes   Which pharmacy would you like this sent to?  [id number] [place], [place] - [address] [place] [address] Phone: [phone] Fax: [phone]    Patient notified that their request is being sent to the clinical staff for review and that they should receive a response within 2 business days.   Please advise patient.

## 2023-01-12 MED ORDER — PANTOPRAZOLE SODIUM 40 MG PO TBEC: 40.0000 mg | DELAYED_RELEASE_TABLET | Freq: Every day | ORAL | 1 refills | Status: DC

## 2023-01-12 NOTE — Telephone Encounter (Signed)
Last OV 12/23/22 Requested Prescriptions  Pending Prescriptions Disp Refills   pantoprazole (PROTONIX) 40 MG tablet 90 tablet 1    Sig: Take 1 tablet (40 mg total) by mouth daily.     Gastroenterology: Proton Pump Inhibitors Failed - 01/11/2023  9:56 AM      Failed - Valid encounter within last 12 months    Recent Outpatient Visits           1 year ago Vertigo   [facility] Tanya Nones, Priscille Heidelberg, MD   1 year ago Acute otalgia, right   [facility] Pickard, Priscille Heidelberg, MD   1 year ago Pure hypercholesterolemia   [facility] Pickard, Priscille Heidelberg, MD   1 year ago Seborrheic keratoses   [facility] Donita Brooks, MD   1 year ago Chronic left-sided low back pain with left-sided sciatica   [facility] Pickard, Priscille Heidelberg, MD

## 2023-01-14 ENCOUNTER — Ambulatory Visit (INDEPENDENT_AMBULATORY_CARE_PROVIDER_SITE_OTHER): Payer: Medicare HMO | Admitting: Family Medicine

## 2023-01-14 ENCOUNTER — Encounter: Payer: Self-pay | Admitting: Family Medicine

## 2023-01-14 VITALS — BP 120/62 | HR 84 | Temp 98.4°F | Ht 61.0 in | Wt 164.0 lb

## 2023-01-14 DIAGNOSIS — M5432 Sciatica, left side: Secondary | ICD-10-CM | POA: Diagnosis not present

## 2023-01-14 DIAGNOSIS — Z1211 Encounter for screening for malignant neoplasm of colon: Secondary | ICD-10-CM

## 2023-01-14 DIAGNOSIS — M5136 Other intervertebral disc degeneration, lumbar region: Secondary | ICD-10-CM | POA: Diagnosis not present

## 2023-01-14 MED ORDER — PREDNISONE 20 MG PO TABS: ORAL_TABLET | ORAL | 0 refills | Status: DC

## 2023-01-14 NOTE — Progress Notes (Signed)
Subjective:    Patient ID: Taylor Singleton, female    DOB: Aug 19, 1955, 67 y.o.   MRN: [id number]  Patient complains of severe back pain around the level of L2-L4 that has been present for years.  Patient had an MRI in 2022 that showed possible right-sided nerve impingement L5.  She has been seeing a specialist who been performing epidural steroid injections however this only helps her pain for a month and then the pain becomes severe and debilitating.  However the pain has been radiating down her left leg.  She states the pain begins in her left gluteus and radiates down her left leg into her left foot.  Has been doing this now for more than a year.  There was no evidence of any nerve impingement on MRI in 2022 she denies any saddle anesthesia.  She denies bowel bladder incontinence.  She has an appointment to see the neurosurgeon on August 15 to discuss surgical options however her previous MRI shows no indication of what would cause the left-sided sciatica that she has been plagued with now for more than 6 months 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    Varicose vein of leg    Wears glasses    Past Surgical History:  Procedure Laterality Date   ANTERIOR AND POSTERIOR REPAIR WITH SACROSPINOUS FIXATION N/A 07/19/2022   Procedure: POSTERIOR REPAIR WITHP PERINEORRHAPHY AND  SACROSPINOUS FIXATION;  Surgeon: Marguerita Beards, MD;  Location: [facility];  Service: Gynecology;  Laterality: N/A;  Total time requested is 1.5hrs   BREAST CYST EXCISION Left    CHOLECYSTECTOMY, LAPAROSCOPIC  2010   TOTAL ABDOMINAL HYSTERECTOMY W/ BILATERAL SALPINGOOPHORECTOMY     1990s   Current Outpatient Medications on File Prior to Visit  Medication Sig  Dispense Refill   atorvastatin (LIPITOR) 40 MG tablet TAKE 1 TABLET BY MOUTH EVERY DAY 30 tablet 0   Calcium Carb-Cholecalciferol (CALCIUM/VITAMIN D) 600-400 MG-UNIT TABS Take 2 tablets by mouth daily.     OVER THE COUNTER MEDICATION Take 1 tablet by mouth daily. gummy     pantoprazole (PROTONIX) 40 MG tablet Take 1 tablet (40 mg total) by mouth daily. 90 tablet 1   No current facility-administered medications on file prior to visit.   No Known Allergies Social History   Socioeconomic History   Marital status: Married    Spouse name: Not on file   Number of children: 2   Years of education: Not on file   Highest education level: Not on file  Occupational History   Not on file  Tobacco Use   Smoking status: Never   Smokeless tobacco: Never  Vaping Use   Vaping status: Never Used  Substance and Sexual Activity   Alcohol use: No   Drug use: Never   Sexual activity: Not Currently    Birth control/protection: Surgical    Comment: hyst  Other Topics Concern   Not on file  Social History Narrative   Not on file   Social Determinants of Health   Financial Resource Strain: Low Risk  (05/25/2022)   Overall Financial Resource Strain (CARDIA)    Difficulty of Paying Living Expenses: Not hard at all  Food Insecurity: No Food  Insecurity (05/25/2022)   Hunger Vital Sign    Worried About Running Out of Food in the Last Year: Never true    Ran Out of Food in the Last Year: Never true  Transportation Needs: No Transportation Needs (05/25/2022)   PRAPARE - Administrator, Civil Service (Medical): No    Lack of Transportation (Non-Medical): No  Physical Activity: Insufficiently Active (05/25/2022)   Exercise Vital Sign    Days of Exercise per Week: 3 days    Minutes of Exercise per Session: 30 min  Stress: No Stress Concern Present (05/25/2022)   [employer] of Occupational Health - Occupational Stress Questionnaire    Feeling of Stress : Not at all  Social  Connections: Moderately Isolated (05/25/2022)   Social Connection and Isolation Panel [NHANES]    Frequency of Communication with Friends and Family: Twice a week    Frequency of Social Gatherings with Friends and Family: Three times a week    Attends Religious Services: Never    Active Member of Clubs or Organizations: No    Attends Banker Meetings: Never    Marital Status: Married  Catering manager Violence: Not At Risk (05/25/2022)   Humiliation, Afraid, Rape, and Kick questionnaire    Fear of Current or Ex-Partner: No    Emotionally Abused: No    Physically Abused: No    Sexually Abused: No     Review of Systems     Objective:   Physical Exam Constitutional:      Appearance: She is well-developed.  HENT:     Mouth/Throat:     Pharynx: No oropharyngeal exudate or posterior oropharyngeal erythema.  Cardiovascular:     Rate and Rhythm: Normal rate and regular rhythm.     Heart sounds: Normal heart sounds.  Pulmonary:     Effort: Pulmonary effort is normal. No respiratory distress.     Breath sounds: No wheezing or rales.  Musculoskeletal:     Cervical back: Neck supple.     Lumbar back: Tenderness present. No spasms. Decreased range of motion.       Back:       Legs:  Lymphadenopathy:     Cervical: No cervical adenopathy.           Assessment & Plan:  DDD (degenerative disc disease), lumbar - Plan: MR Lumbar Spine Wo Contrast  Left sided sciatica - Plan: MR Lumbar Spine Wo Contrast  Colon cancer screening - Plan: Ambulatory referral to Gastroenterology Patient has failed conservative therapy including seeing chiropractor, chronic NSAIDs, numerous epidural steroid injections.  She is now seeing a neurosurgeon however MRI from 2022 showed no obvious source left-sided nerve injury therefore recommend repeat MRI lumbar on and over the neurosurgeon to be able to effectively give her surgical options.  Consult GI as she is overdue for colonoscopy

## 2023-01-24 ENCOUNTER — Encounter (INDEPENDENT_AMBULATORY_CARE_PROVIDER_SITE_OTHER): Payer: Self-pay | Admitting: *Deleted

## 2023-01-25 ENCOUNTER — Telehealth: Payer: Self-pay

## 2023-01-25 NOTE — Telephone Encounter (Signed)
Pt came by office stating that [facility] is requiring a Peer to Peer with you by 8/18 to approve her MRI. Pt has an appointment with Marikay Alar, MD, Neurosurgery on Thursday. Pt asks if we should cancel the current MRI and let her be evaluated by Dr. Yetta Barre and have him order if need be? Thank you!

## 2023-01-27 DIAGNOSIS — Z6830 Body mass index (BMI) 30.0-30.9, adult: Secondary | ICD-10-CM | POA: Diagnosis not present

## 2023-01-27 DIAGNOSIS — M5416 Radiculopathy, lumbar region: Secondary | ICD-10-CM | POA: Diagnosis not present

## 2023-02-02 ENCOUNTER — Other Ambulatory Visit: Payer: Medicare HMO

## 2023-02-04 ENCOUNTER — Ambulatory Visit (INDEPENDENT_AMBULATORY_CARE_PROVIDER_SITE_OTHER): Payer: Medicare HMO | Admitting: Family Medicine

## 2023-02-04 VITALS — BP 120/62 | HR 81 | Temp 98.3°F | Ht 61.0 in | Wt 161.0 lb

## 2023-02-04 DIAGNOSIS — M3500 Sicca syndrome, unspecified: Secondary | ICD-10-CM

## 2023-02-04 NOTE — Progress Notes (Signed)
Subjective:    Patient ID: Taylor Singleton, female    DOB: [birth year], 67 y.o.   MRN: [id number] Patient is a very pleasant 67 year old Caucasian female who is here today complaining of dry mouth.  She has tried over-the-counter Biotene with no benefit.  She denies any dry.  She states that she has to wake up twice every evening to drink water.  She is not having any trouble swallowing pills.  She denies any trouble swallowing food or to come out.  However she does feel examined is more water in the due to dry mouth.  She also reports increased urinary frequency.  She denies any dysuria.  She does have a family history of type 2 diabetes however her last blood sugar was 103 in April.  She has recently been on prednisone for a low back issue.  She denies any pain or swelling in her parotid glands, submandibular glands.  There are no palpable masses in these areas.  There is no lymphadenopathy in the neck.  There is a pool of saliva underneath her tongue and her mucous membranes are moist.  There is no lack of tear film of her eyes. 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    Varicose vein of leg    Wears glasses    Past Surgical History:  Procedure Laterality Date   ANTERIOR AND POSTERIOR REPAIR WITH SACROSPINOUS FIXATION N/A 07/19/2022   Procedure: POSTERIOR REPAIR WITHP PERINEORRHAPHY AND  SACROSPINOUS FIXATION;  Surgeon: Marguerita Beards, MD;  Location: [facility];  Service: Gynecology;  Laterality: N/A;  Total time requested is 1.5hrs   BREAST CYST EXCISION Left    CHOLECYSTECTOMY, LAPAROSCOPIC  2010   TOTAL ABDOMINAL HYSTERECTOMY W/ BILATERAL SALPINGOOPHORECTOMY     1990s   Current Outpatient Medications on File Prior to Visit   Medication Sig Dispense Refill   atorvastatin (LIPITOR) 40 MG tablet TAKE 1 TABLET BY MOUTH EVERY DAY 30 tablet 0   Calcium Carb-Cholecalciferol (CALCIUM/VITAMIN D) 600-400 MG-UNIT TABS Take 2 tablets by mouth daily.     OVER THE COUNTER MEDICATION Take 1 tablet by mouth daily. gummy     pantoprazole (PROTONIX) 40 MG tablet Take 1 tablet (40 mg total) by mouth daily. 90 tablet 1   predniSONE (DELTASONE) 20 MG tablet 3 tabs poqday 1-2, 2 tabs poqday 3-4, 1 tab poqday 5-6 12 tablet 0   No current facility-administered medications on file prior to visit.   No Known Allergies Social History   Socioeconomic History   Marital status: Married    Spouse name: Not on file   Number of children: 2   Years of education: Not on file   Highest education level: Not on file  Occupational History   Not on file  Tobacco Use   Smoking status: Never   Smokeless tobacco: Never  Vaping Use   Vaping status: Never Used  Substance and Sexual Activity   Alcohol use: No   Drug use: Never   Sexual activity: Not Currently    Birth control/protection: Surgical    Comment: hyst  Other Topics Concern   Not on file  Social History Narrative   Not on file   Social Determinants of Health  Financial Resource Strain: Low Risk  (05/25/2022)   Overall Financial Resource Strain (CARDIA)    Difficulty of Paying Living Expenses: Not hard at all  Food Insecurity: No Food Insecurity (05/25/2022)   Hunger Vital Sign    Worried About Running Out of Food in the Last Year: Never true    Ran Out of Food in the Last Year: Never true  Transportation Needs: No Transportation Needs (05/25/2022)   PRAPARE - Administrator, Civil Service (Medical): No    Lack of Transportation (Non-Medical): No  Physical Activity: Insufficiently Active (05/25/2022)   Exercise Vital Sign    Days of Exercise per Week: 3 days    Minutes of Exercise per Session: 30 min  Stress: No Stress Concern Present (05/25/2022)    [employer] of Occupational Health - Occupational Stress Questionnaire    Feeling of Stress : Not at all  Social Connections: Moderately Isolated (05/25/2022)   Social Connection and Isolation Panel [NHANES]    Frequency of Communication with Friends and Family: Twice a week    Frequency of Social Gatherings with Friends and Family: Three times a week    Attends Religious Services: Never    Active Member of Clubs or Organizations: No    Attends Banker Meetings: Never    Marital Status: Married  Catering manager Violence: Not At Risk (05/25/2022)   Humiliation, Afraid, Rape, and Kick questionnaire    Fear of Current or Ex-Partner: No    Emotionally Abused: No    Physically Abused: No    Sexually Abused: No     Review of Systems  All other systems reviewed and are negative.      Objective:   Physical Exam Constitutional:      General: She is not in acute distress.    Appearance: Normal appearance. She is well-developed and normal weight. She is not ill-appearing or toxic-appearing.  HENT:     Right Ear: Tympanic membrane and ear canal normal.     Left Ear: Tympanic membrane and ear canal normal.     Nose: Nose normal.     Mouth/Throat:     Mouth: Mucous membranes are moist.     Pharynx: Oropharynx is clear. No oropharyngeal exudate or posterior oropharyngeal erythema.  Eyes:     Extraocular Movements: Extraocular movements intact.     Conjunctiva/sclera: Conjunctivae normal.     Pupils: Pupils are equal, round, and reactive to light.  Cardiovascular:     Rate and Rhythm: Normal rate and regular rhythm.     Heart sounds: Normal heart sounds.  Pulmonary:     Effort: Pulmonary effort is normal. No respiratory distress.     Breath sounds: No wheezing or rales.  Musculoskeletal:     Cervical back: Neck supple.  Lymphadenopathy:     Cervical: No cervical adenopathy.  Skin:    Findings: No rash.  Neurological:     Mental Status: She is alert and  oriented to person, place, and time.     Cranial Nerves: No cranial nerve deficit, dysarthria or facial asymmetry.     Sensory: No sensory deficit.     Motor: No weakness, tremor, atrophy, abnormal muscle tone or seizure activity.     Coordination: Romberg sign negative. Coordination normal. Finger-Nose-Finger Test normal.     Gait: Gait normal.     Deep Tendon Reflexes: Reflexes normal.  Psychiatric:        Mood and Affect: Mood normal.  Behavior: Behavior normal.        Thought Content: Thought content normal.        Judgment: Judgment normal.           Assessment & Plan:  Sicca, unspecified type (HCC) - Plan: COMPLETE METABOLIC PANEL WITH GFR, CBC with Differential/Platelet I believe the patient is dealing with benign sicca syndrome.  I see no evidence to support Sjogren's disease.  I will check a CMP to evaluate her blood sugar and if elevated I will add a hemoglobin A1c to rule out dehydration secondary to hyperglycemia.  I will also check a CBC.  If labs are normal I have reassured the patient that I feel that this is a benign condition.  We discussed home remedies to try to help with this

## 2023-02-05 LAB — CBC WITH DIFFERENTIAL/PLATELET
Absolute Monocytes: 467 {cells}/uL (ref 200–950)
Basophils Absolute: 51 {cells}/uL (ref 0–200)
Basophils Relative: 0.9 %
Eosinophils Absolute: 160 {cells}/uL (ref 15–500)
Eosinophils Relative: 2.8 %
HCT: 39.3 % (ref 35.0–45.0)
Hemoglobin: 13.3 g/dL (ref 11.7–15.5)
Lymphs Abs: 1340 cells/uL (ref 850–3900)
MCH: 29.6 pg (ref 27.0–33.0)
MCHC: 33.8 g/dL (ref 32.0–36.0)
MCV: 87.5 fL (ref 80.0–100.0)
MPV: 11.2 fL (ref 7.5–12.5)
Monocytes Relative: 8.2 %
Neutro Abs: 3682 {cells}/uL (ref 1500–7800)
Neutrophils Relative %: 64.6 %
Platelets: 281 10*3/uL (ref 140–400)
RBC: 4.49 10*6/uL (ref 3.80–5.10)
RDW: 12.3 % (ref 11.0–15.0)
Total Lymphocyte: 23.5 %
WBC: 5.7 10*3/uL (ref 3.8–10.8)

## 2023-02-05 LAB — COMPLETE METABOLIC PANEL WITH GFR
AG Ratio: 2 (calc) (ref 1.0–2.5)
ALT: 33 U/L — ABNORMAL HIGH (ref 6–29)
AST: 21 U/L (ref 10–35)
Albumin: 4.3 g/dL (ref 3.6–5.1)
Alkaline phosphatase (APISO): 76 U/L (ref 37–153)
BUN: 19 mg/dL (ref 7–25)
CO2: 27 mmol/L (ref 20–32)
Calcium: 9.7 mg/dL (ref 8.6–10.4)
Chloride: 104 mmol/L (ref 98–110)
Creat: 0.59 mg/dL (ref 0.50–1.05)
Globulin: 2.2 g/dL (ref 1.9–3.7)
Glucose, Bld: 108 mg/dL — ABNORMAL HIGH (ref 65–99)
Potassium: 4 mmol/L (ref 3.5–5.3)
Sodium: 140 mmol/L (ref 135–146)
Total Bilirubin: 1.1 mg/dL (ref 0.2–1.2)
Total Protein: 6.5 g/dL (ref 6.1–8.1)
eGFR: 97 mL/min/{1.73_m2} (ref >=60)

## 2023-02-10 DIAGNOSIS — M5416 Radiculopathy, lumbar region: Secondary | ICD-10-CM | POA: Diagnosis not present

## 2023-02-15 ENCOUNTER — Telehealth: Payer: Self-pay | Admitting: Family Medicine

## 2023-02-15 ENCOUNTER — Other Ambulatory Visit: Payer: Self-pay

## 2023-02-15 DIAGNOSIS — K224 Dyskinesia of esophagus: Secondary | ICD-10-CM

## 2023-02-15 DIAGNOSIS — K219 Gastro-esophageal reflux disease without esophagitis: Secondary | ICD-10-CM

## 2023-02-15 MED ORDER — PANTOPRAZOLE SODIUM 40 MG PO TBEC: 40.0000 mg | DELAYED_RELEASE_TABLET | Freq: Every day | ORAL | 1 refills | Status: DC

## 2023-02-15 NOTE — Telephone Encounter (Signed)
Prescription Request  02/15/2023  LOV: 02/04/2023  What is the name of the medication or equipment?   pantoprazole (PROTONIX) 40 MG tablet  **90 day script requested**  Have you contacted your pharmacy to request a refill? Yes   Which pharmacy would you like this sent to?  [id number] [place], [place] - [address] [place] [address] Phone: [phone] Fax: [phone]    Patient notified that their request is being sent to the clinical staff for review and that they should receive a response within 2 business days.   Please advise pharmacist.

## 2023-02-17 DIAGNOSIS — Z6830 Body mass index (BMI) 30.0-30.9, adult: Secondary | ICD-10-CM | POA: Diagnosis not present

## 2023-02-17 DIAGNOSIS — M5416 Radiculopathy, lumbar region: Secondary | ICD-10-CM | POA: Diagnosis not present

## 2023-02-24 ENCOUNTER — Telehealth (INDEPENDENT_AMBULATORY_CARE_PROVIDER_SITE_OTHER): Payer: Self-pay | Admitting: Gastroenterology

## 2023-02-24 NOTE — Telephone Encounter (Signed)
Who is your primary care physician: Dr.Warren Pickard  Reasons for the colonoscopy: 10 year recall  Have you had a colonoscopy before?  Yes 10 years ago  Do you have family history of colon cancer? no  Previous colonoscopy with polyps removed? no  Do you have a history colorectal cancer?   no  Are you diabetic? If yes, Type 1 or Type 2?    no  Do you have a prosthetic or mechanical heart valve? no  Do you have a pacemaker/defibrillator?   no  Have you had endocarditis/atrial fibrillation? no  Have you had joint replacement within the last 12 months?  no  Do you tend to be constipated or have to use laxatives? no  Do you have any history of drugs or alchohol?  no  Do you use supplemental oxygen?  no  Have you had a stroke or heart attack within the last 6 months?no  Do you take weight loss medication?  no  For female patients: have you had a hysterectomy?  yes                                     are you post menopausal?       no                                            do you still have your menstrual cycle? no      Do you take any blood-thinning medications such as: (aspirin, warfarin, Plavix, Aggrenox)    If yes we need the name, milligram, dosage and who is prescribing doctor  Current Outpatient Medications on File Prior to Visit  Medication Sig Dispense Refill   atorvastatin (LIPITOR) 40 MG tablet TAKE 1 TABLET BY MOUTH EVERY DAY 30 tablet 0   Calcium Carb-Cholecalciferol (CALCIUM/VITAMIN D) 600-400 MG-UNIT TABS Take 2 tablets by mouth daily.     OVER THE COUNTER MEDICATION Take 1 tablet by mouth daily. gummy     pantoprazole (PROTONIX) 40 MG tablet Take 1 tablet (40 mg total) by mouth daily. 90 tablet 1   predniSONE (DELTASONE) 20 MG tablet 3 tabs poqday 1-2, 2 tabs poqday 3-4, 1 tab poqday 5-6 (Patient not taking: Reported on 02/04/2023) 12 tablet 0   No current facility-administered medications on file prior to visit.    No Known Allergies   Pharmacy:    Primary Insurance Name: [employer] [place]  Best number where you can be reached: [phone]

## 2023-02-24 NOTE — Telephone Encounter (Signed)
Will call once complete October schedule is out

## 2023-02-24 NOTE — Telephone Encounter (Signed)
Room 1 Thanks 

## 2023-03-01 ENCOUNTER — Ambulatory Visit: Payer: Medicare HMO | Admitting: Family Medicine

## 2023-03-01 VITALS — BP 120/72 | HR 90 | Temp 97.6°F | Ht 61.0 in | Wt 161.4 lb

## 2023-03-01 DIAGNOSIS — F411 Generalized anxiety disorder: Secondary | ICD-10-CM | POA: Diagnosis not present

## 2023-03-01 MED ORDER — ESCITALOPRAM OXALATE 10 MG PO TABS: 10.0000 mg | ORAL_TABLET | Freq: Every day | ORAL | 3 refills | Status: DC

## 2023-03-01 NOTE — Progress Notes (Signed)
Subjective:    Patient ID: Taylor Singleton, female    DOB: July 27, 1955, 67 y.o.   MRN: [id number] Patient is a very pleasant 67 year old Caucasian female who is here today complaining of dry mouth.  We discussed this at her last visit.  She is very anxious about this.  She admits that she worries excessively.  She states that little things get to her easily and cause her excessive amounts of worry.  She feels anxious all the time.  She tends to perseverate over physical symptoms.  She denies depression.  She denies panic attacks.  She does complain of globus sensation. 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    Varicose vein of leg    Wears glasses    Past Surgical History:  Procedure Laterality Date   ANTERIOR AND POSTERIOR REPAIR WITH SACROSPINOUS FIXATION N/A 07/19/2022   Procedure: POSTERIOR REPAIR WITHP PERINEORRHAPHY AND  SACROSPINOUS FIXATION;  Surgeon: Marguerita Beards, MD;  Location: [facility];  Service: Gynecology;  Laterality: N/A;  Total time requested is 1.5hrs   BREAST CYST EXCISION Left    CHOLECYSTECTOMY, LAPAROSCOPIC  2010   TOTAL ABDOMINAL HYSTERECTOMY W/ BILATERAL SALPINGOOPHORECTOMY     1990s   Current Outpatient Medications on File Prior to Visit  Medication Sig Dispense Refill   atorvastatin (LIPITOR) 40 MG tablet TAKE 1 TABLET BY MOUTH EVERY DAY 30 tablet 0   Calcium Carb-Cholecalciferol (CALCIUM/VITAMIN D) 600-400 MG-UNIT TABS Take 2 tablets by mouth daily.     OVER THE COUNTER MEDICATION Take 1 tablet by mouth daily. gummy     pantoprazole (PROTONIX) 40 MG tablet Take 1 tablet (40 mg total) by mouth daily. 90 tablet 1   predniSONE (DELTASONE) 20 MG tablet 3 tabs poqday 1-2, 2 tabs poqday 3-4, 1 tab poqday  5-6 (Patient not taking: Reported on 02/04/2023) 12 tablet 0   No current facility-administered medications on file prior to visit.   No Known Allergies Social History   Socioeconomic History   Marital status: Married    Spouse name: Not on file   Number of children: 2   Years of education: Not on file   Highest education level: Not on file  Occupational History   Not on file  Tobacco Use   Smoking status: Never   Smokeless tobacco: Never  Vaping Use   Vaping status: Never Used  Substance and Sexual Activity   Alcohol use: No   Drug use: Never   Sexual activity: Not Currently    Birth control/protection: Surgical    Comment: hyst  Other Topics Concern   Not on file  Social History Narrative   Not on file   Social Determinants of Health   Financial Resource Strain: Low Risk  (05/25/2022)   Overall Financial Resource Strain (CARDIA)    Difficulty of Paying Living Expenses: Not hard at all  Food Insecurity: No Food Insecurity (05/25/2022)   Hunger Vital Sign    Worried About Running Out of Food in the Last Year: Never true    Ran Out of Food in the Last Year: Never true  Transportation Needs: No Transportation Needs (05/25/2022)   PRAPARE - Administrator, Civil Service (Medical): No  Lack of Transportation (Non-Medical): No  Physical Activity: Insufficiently Active (05/25/2022)   Exercise Vital Sign    Days of Exercise per Week: 3 days    Minutes of Exercise per Session: 30 min  Stress: No Stress Concern Present (05/25/2022)   [employer] of Occupational Health - Occupational Stress Questionnaire    Feeling of Stress : Not at all  Social Connections: Moderately Isolated (05/25/2022)   Social Connection and Isolation Panel [NHANES]    Frequency of Communication with Friends and Family: Twice a week    Frequency of Social Gatherings with Friends and Family: Three times a week    Attends Religious Services: Never    Active Member of Clubs or  Organizations: No    Attends Banker Meetings: Never    Marital Status: Married  Catering manager Violence: Not At Risk (05/25/2022)   Humiliation, Afraid, Rape, and Kick questionnaire    Fear of Current or Ex-Partner: No    Emotionally Abused: No    Physically Abused: No    Sexually Abused: No     Review of Systems  All other systems reviewed and are negative.      Objective:   Physical Exam Constitutional:      General: She is not in acute distress.    Appearance: Normal appearance. She is well-developed and normal weight. She is not ill-appearing or toxic-appearing.  HENT:     Right Ear: Tympanic membrane and ear canal normal.     Left Ear: Tympanic membrane and ear canal normal.     Nose: Nose normal.     Mouth/Throat:     Mouth: Mucous membranes are moist.     Pharynx: Oropharynx is clear. No oropharyngeal exudate or posterior oropharyngeal erythema.  Eyes:     Extraocular Movements: Extraocular movements intact.     Conjunctiva/sclera: Conjunctivae normal.     Pupils: Pupils are equal, round, and reactive to light.  Cardiovascular:     Rate and Rhythm: Normal rate and regular rhythm.     Heart sounds: Normal heart sounds.  Pulmonary:     Effort: Pulmonary effort is normal. No respiratory distress.     Breath sounds: No wheezing or rales.  Musculoskeletal:     Cervical back: Neck supple.  Lymphadenopathy:     Cervical: No cervical adenopathy.  Skin:    Findings: No rash.  Neurological:     Mental Status: She is alert and oriented to person, place, and time.     Cranial Nerves: No cranial nerve deficit, dysarthria or facial asymmetry.     Sensory: No sensory deficit.     Motor: No weakness, tremor, atrophy, abnormal muscle tone or seizure activity.     Coordination: Romberg sign negative. Coordination normal. Finger-Nose-Finger Test normal.     Gait: Gait normal.     Deep Tendon Reflexes: Reflexes normal.  Psychiatric:        Mood and Affect:  Mood normal.        Behavior: Behavior normal.        Thought Content: Thought content normal.        Judgment: Judgment normal.           Assessment & Plan:  GAD (generalized anxiety disorder) Begin Lexapro 10 mg a day and recheck in 6 weeks

## 2023-03-04 ENCOUNTER — Encounter (INDEPENDENT_AMBULATORY_CARE_PROVIDER_SITE_OTHER): Payer: Self-pay | Admitting: *Deleted

## 2023-03-04 MED ORDER — PEG 3350-KCL-NA BICARB-NACL 420 G PO SOLR: 4000.0000 mL | Freq: Once | ORAL | 0 refills | Status: AC

## 2023-03-04 NOTE — Telephone Encounter (Signed)
Pt contacted and scheduled for 03/22/23 at 8:45am. Prep sent to pharmacy. Instructions will be mailed once pre op is received (pt requested a Tuesday). No pa needed via insurance.

## 2023-03-04 NOTE — Addendum Note (Signed)
Addended by: Marlowe Shores on: 03/04/2023 10:41 AM   Modules accepted: Orders

## 2023-03-04 NOTE — Telephone Encounter (Signed)
Referral completed

## 2023-03-08 ENCOUNTER — Telehealth: Payer: Self-pay | Admitting: Family Medicine

## 2023-03-08 NOTE — Telephone Encounter (Signed)
Patient called to advise she feel nauseous all the time which worsens after she eats. Patient also has a lot of gas and moves her bowels a lot; stated she's had this issue before.  Patient stated she's scheduled for a colonoscopy on 10/8 2024; she's requesting a referral for an endoscopy to be done at the same time to avoid having to do a separate procedure.   Patient unsure if a PA is required.  Please advise at [phone].

## 2023-03-14 ENCOUNTER — Other Ambulatory Visit: Payer: Self-pay

## 2023-03-14 ENCOUNTER — Telehealth (INDEPENDENT_AMBULATORY_CARE_PROVIDER_SITE_OTHER): Payer: Self-pay | Admitting: Gastroenterology

## 2023-03-14 ENCOUNTER — Telehealth: Payer: Self-pay

## 2023-03-14 DIAGNOSIS — E7800 Pure hypercholesterolemia, unspecified: Secondary | ICD-10-CM

## 2023-03-14 MED ORDER — ATORVASTATIN CALCIUM 40 MG PO TABS: 40.0000 mg | ORAL_TABLET | Freq: Every day | ORAL | 2 refills | Status: DC

## 2023-03-14 NOTE — Telephone Encounter (Signed)
Pt left voicemail asking for a call back due to some questions she is having related to TCS. Returned call to patient. Pt is wanting to know if she is needing to see the provider before colonoscopy due to abdominal pain after eating and increased flatulence.  Advised pt she may want to come in before colonoscopy to make sure she doesn't need anything else and to be evaluated. Pt verbalized understanding and transferred to front to schedule office appt.

## 2023-03-14 NOTE — Telephone Encounter (Signed)
Prescription Request  03/14/2023  LOV: 03/01/23  What is the name of the medication or equipment? atorvastatin (LIPITOR) 40 MG tablet [[id number]]  Have you contacted your pharmacy to request a refill? Yes   Which pharmacy would you like this sent to?  [id number] [place], [place] - [address] [place] [address] Phone: [phone] Fax: [phone]    Patient notified that their request is being sent to the clinical staff for review and that they should receive a response within 2 business days.   Please advise at [facility] [phone]

## 2023-03-15 DIAGNOSIS — Z6830 Body mass index (BMI) 30.0-30.9, adult: Secondary | ICD-10-CM | POA: Diagnosis not present

## 2023-03-15 DIAGNOSIS — M5416 Radiculopathy, lumbar region: Secondary | ICD-10-CM | POA: Diagnosis not present

## 2023-03-17 ENCOUNTER — Encounter (INDEPENDENT_AMBULATORY_CARE_PROVIDER_SITE_OTHER): Payer: Self-pay | Admitting: Gastroenterology

## 2023-03-17 ENCOUNTER — Ambulatory Visit (INDEPENDENT_AMBULATORY_CARE_PROVIDER_SITE_OTHER): Payer: Medicare HMO | Admitting: Gastroenterology

## 2023-03-17 ENCOUNTER — Encounter (HOSPITAL_COMMUNITY)
Admission: RE | Admit: 2023-03-17 | Discharge: 2023-03-17 | Disposition: A | Discharge status: Home / Self Care | Payer: Medicare HMO | Source: Ambulatory Visit | Attending: Gastroenterology | Admitting: Gastroenterology

## 2023-03-17 ENCOUNTER — Encounter (HOSPITAL_COMMUNITY): Payer: Self-pay

## 2023-03-17 ENCOUNTER — Other Ambulatory Visit: Payer: Self-pay

## 2023-03-17 VITALS — BP 136/78 | HR 74 | Temp 97.5°F | Ht 61.0 in | Wt 162.4 lb

## 2023-03-17 DIAGNOSIS — R110 Nausea: Secondary | ICD-10-CM

## 2023-03-17 DIAGNOSIS — R143 Flatulence: Secondary | ICD-10-CM

## 2023-03-17 MED ORDER — ONDANSETRON HCL 4 MG PO TABS
4.0000 mg | ORAL_TABLET | Freq: Three times a day (TID) | ORAL | 1 refills | Status: DC | PRN
Start: 2023-03-17 — End: 2023-08-08

## 2023-03-17 NOTE — H&P (View-Only) (Signed)
Katrinka Blazing, M.D. Gastroenterology & Hepatology [facility] [address] [place], [place] [address] Primary Care Physician: Donita Brooks, MD [address] [place] [address]  Referring MD: PCP  Chief Complaint: Flatulence and nausea  History of Present Illness: Taylor Singleton is a 67 y.o. female with past medical history of hyperlipidemia, GERD, constipation, rectal prolapse status post surgical repair, who presents for evaluation of flatulence and nausea.  Patient reports that for the last month she has presented frequent flatulence. She has not changed her diet any time recently. She reports that she has been embarrassed as she has presented some episodes in which gas leaks out. Denies any constipation or diarrhea. She reports that she had a stage III rectal prolapse which was surgically repaired in February. She is currently moving her bowels daily, has to strain too little. Denies any bloating or abdominal pain.  She states that she has also presented nausea recently, possibly endoscopic submucosal dissection started after labor day. She says it has been progressing through  time, and is now intermittent. She does not vomit.  She reports that she has felt very dry throat recently. Has been following with her PCP for this. This causes discomfort in there throat - states this feels like a "dry mouth". No dysphagia or odynophagia. She takes protonix daily for GERD.  The patient denies having any vomiting, fever, chills, hematochezia, melena, hematemesis, abdominal distention, abdominal pain, diarrhea, jaundice, pruritus or weight loss.  Patient has an upcoming colonoscopy on 03/22/2023 with me for screening purposes.  Last WGN:FAOZHYQM 15 years ago, was performed for LUQ pain, no reports available Last Colonoscopy:possibly 15 years ago, performed at [facility] she did not have polyps, no reports available  FHx: neg for  any gastrointestinal/liver disease, no malignancies Social: neg smoking, alcohol or illicit drug use Surgical: cholecystectomy  Past Medical 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    Varicose vein of leg    Wears glasses     Past Surgical History: Past Surgical History:  Procedure Laterality Date   ANTERIOR AND POSTERIOR REPAIR WITH SACROSPINOUS FIXATION N/A 07/19/2022   Procedure: POSTERIOR REPAIR WITHP PERINEORRHAPHY AND  SACROSPINOUS FIXATION;  Surgeon: Marguerita Beards, MD;  Location: [facility];  Service: Gynecology;  Laterality: N/A;  Total time requested is 1.5hrs   BREAST CYST EXCISION Left    CHOLECYSTECTOMY     CHOLECYSTECTOMY, LAPAROSCOPIC  2010   TOTAL ABDOMINAL HYSTERECTOMY W/ BILATERAL SALPINGOOPHORECTOMY     1990s    Family History: Family History  Problem Relation Age of Onset   Diabetes Mother    Heart attack Brother     Social History: Social History   Tobacco Use  Smoking Status Never  Smokeless Tobacco Never   Social History   Substance and Sexual Activity  Alcohol Use No   Social History   Substance and Sexual Activity  Drug Use Never    Allergies: No Known Allergies  Medications: Current Outpatient Medications  Medication Sig Dispense Refill   atorvastatin (LIPITOR) 40 MG tablet Take 1 tablet (40 mg total) by mouth daily. 90 tablet 2   Calcium Carb-Cholecalciferol (CALCIUM/VITAMIN D) 600-400 MG-UNIT  TABS Take 2 tablets by mouth daily.     escitalopram (LEXAPRO) 10 MG tablet Take 1 tablet (10 mg total) by mouth daily. 30 tablet 3   OVER THE COUNTER MEDICATION Take 1 tablet by mouth daily. Digestive health one gummy daily.     pantoprazole (PROTONIX) 40 MG tablet  Take 1 tablet (40 mg total) by mouth daily. 90 tablet 1   No current facility-administered medications for this visit.    Review of Systems: GENERAL: negative for malaise, night sweats HEENT: No changes in hearing or vision, no nose bleeds or other nasal problems. NECK: Negative for lumps, goiter, pain and significant neck swelling RESPIRATORY: Negative for cough, wheezing CARDIOVASCULAR: Negative for chest pain, leg swelling, palpitations, orthopnea GI: SEE HPI MUSCULOSKELETAL: Negative for joint pain or swelling, back pain, and muscle pain. SKIN: Negative for lesions, rash PSYCH: Negative for sleep disturbance, mood disorder and recent psychosocial stressors. HEMATOLOGY Negative for prolonged bleeding, bruising easily, and swollen nodes. ENDOCRINE: Negative for cold or heat intolerance, polyuria, polydipsia and goiter. NEURO: negative for tremor, gait imbalance, syncope and seizures. The remainder of the review of systems is noncontributory.   Physical Exam: BP 136/78 (BP Location: Left Arm, Patient Position: Sitting, Cuff Size: Normal)   Pulse 74   Temp (!) 97.5 F (36.4 C) (Temporal)   Ht 5\' 1"  (1.549 m)   Wt 162 lb 6.4 oz (73.7 kg)   BMI 30.69 kg/m  GENERAL: The patient is AO x3, in no acute distress. HEENT: Head is normocephalic and atraumatic. EOMI are intact. Mouth is well hydrated and without lesions. NECK: Supple. No masses LUNGS: Clear to auscultation. No presence of rhonchi/wheezing/rales. Adequate chest expansion HEART: RRR, normal s1 and s2. ABDOMEN: Soft, nontender, no guarding, no peritoneal signs, and nondistended. BS +. No masses. EXTREMITIES: Without any cyanosis, clubbing, rash, lesions or edema. NEUROLOGIC: AOx3, no focal motor deficit. SKIN: no jaundice, no rashes   Imaging/Labs: as above  I personally reviewed and interpreted the available labs, imaging and endoscopic files.  Impression and Plan: Taylor Singleton is a 67 y.o. female with past medical  history of hyperlipidemia, GERD, constipation, rectal prolapse status post surgical repair, who presents for evaluation of flatulence and nausea.  Patient reports that she has presented recurrent flatulence and nausea, which started recently.  She has not made any recent changes in her medications and diet.  Unclear why she has presented this but has not presented any red flag signs.  We discussed the possibility of proceeding with further testing if her symptoms do not improve after receiving her bowel prep for her upcoming screening colonoscopy.  For now, she can start taking Zofran as needed to relieve her symptoms.  If her symptoms persist may need to schedule a SIBO breath test and an EGD to further evaluate her complaints.  -Proceed with scheduled colonoscopy -Start Zofran 4 mg q8h as needed for nausea -If persisting symptoms a week after colonoscopy, will schedule SIBO breath test and EGD  All questions were answered.      Katrinka Blazing, MD Gastroenterology and Hepatology [facility]

## 2023-03-17 NOTE — Progress Notes (Signed)
Taylor Singleton, M.D. Gastroenterology & Hepatology [facility] Gastroenter[facility] [address] [place], [place] [address] Primary Care Physician: Donita Brooks, MD [address] [place] [address]  Referring MD: PCP  Chief Complaint: Flatulence and nausea  History of Present Illness: Taylor Singleton is a 67 y.o. female with past medical history of hyperlipidemia, GERD, constipation, rectal prolapse status post surgical repair, who presents for evaluation of flatulence and nausea.  Patient reports that for the last month she has presented frequent flatulence. She has not changed her diet any time recently. She reports that she has been embarrassed as she has presented some episodes in which gas leaks out. Denies any constipation or diarrhea. She reports that she had a stage III rectal prolapse which was surgically repaired in February. She is currently moving her bowels daily, has to strain too little. Denies any bloating or abdominal pain.  She states that she has also presented nausea recently, possibly endoscopic submucosal dissection started after labor day. She says it has been progressing through  time, and is now intermittent. She does not vomit.  She reports that she has felt very dry throat recently. Has been following with her PCP for this. This causes discomfort in there throat - states this feels like a "dry mouth". No dysphagia or odynophagia. She takes protonix daily for GERD.  The patient denies having any vomiting, fever, chills, hematochezia, melena, hematemesis, abdominal distention, abdominal pain, diarrhea, jaundice, pruritus or weight loss.  Patient has an upcoming colonoscopy on 03/22/2023 with me for screening purposes.  Last WGN:FAOZHYQM 15 years ago, was performed for LUQ pain, no reports available Last Colonoscopy:possibly 15 years ago, performed at [facility] she did not have polyps, no reports available  FHx: neg for  any gastrointestinal/liver disease, no malignancies Social: neg smoking, alcohol or illicit drug use Surgical: cholecystectomy  Past Medical 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    Varicose vein of leg    Wears glasses     Past Surgical History: Past Surgical History:  Procedure Laterality Date   ANTERIOR AND POSTERIOR REPAIR WITH SACROSPINOUS FIXATION N/A 07/19/2022   Procedure: POSTERIOR REPAIR WITHP PERINEORRHAPHY AND  SACROSPINOUS FIXATION;  Surgeon: Marguerita Beards, MD;  Location: [facility];  Service: Gynecology;  Laterality: N/A;  Total time requested is 1.5hrs   BREAST CYST EXCISION Left    CHOLECYSTECTOMY     CHOLECYSTECTOMY, LAPAROSCOPIC  2010   TOTAL ABDOMINAL HYSTERECTOMY W/ BILATERAL SALPINGOOPHORECTOMY     1990s    Family History: Family History  Problem Relation Age of Onset   Diabetes Mother    Heart attack Brother     Social History: Social History   Tobacco Use  Smoking Status Never  Smokeless Tobacco Never   Social History   Substance and Sexual Activity  Alcohol Use No   Social History   Substance and Sexual Activity  Drug Use Never    Allergies: No Known Allergies  Medications: Current Outpatient Medications  Medication Sig Dispense Refill   atorvastatin (LIPITOR) 40 MG tablet Take 1 tablet (40 mg total) by mouth daily. 90 tablet 2   Calcium Carb-Cholecalciferol (CALCIUM/VITAMIN D) 600-400 MG-UNIT  TABS Take 2 tablets by mouth daily.     escitalopram (LEXAPRO) 10 MG tablet Take 1 tablet (10 mg total) by mouth daily. 30 tablet 3   OVER THE COUNTER MEDICATION Take 1 tablet by mouth daily. Digestive health one gummy daily.     pantoprazole (PROTONIX) 40 MG tablet  Take 1 tablet (40 mg total) by mouth daily. 90 tablet 1   No current facility-administered medications for this visit.    Review of Systems: GENERAL: negative for malaise, night sweats HEENT: No changes in hearing or vision, no nose bleeds or other nasal problems. NECK: Negative for lumps, goiter, pain and significant neck swelling RESPIRATORY: Negative for cough, wheezing CARDIOVASCULAR: Negative for chest pain, leg swelling, palpitations, orthopnea GI: SEE HPI MUSCULOSKELETAL: Negative for joint pain or swelling, back pain, and muscle pain. SKIN: Negative for lesions, rash PSYCH: Negative for sleep disturbance, mood disorder and recent psychosocial stressors. HEMATOLOGY Negative for prolonged bleeding, bruising easily, and swollen nodes. ENDOCRINE: Negative for cold or heat intolerance, polyuria, polydipsia and goiter. NEURO: negative for tremor, gait imbalance, syncope and seizures. The remainder of the review of systems is noncontributory.   Physical Exam: BP 136/78 (BP Location: Left Arm, Patient Position: Sitting, Cuff Size: Normal)   Pulse 74   Temp (!) 97.5 F (36.4 C) (Temporal)   Ht 5\' 1"  (1.549 m)   Wt 162 lb 6.4 oz (73.7 kg)   BMI 30.69 kg/m  GENERAL: The patient is AO x3, in no acute distress. HEENT: Head is normocephalic and atraumatic. EOMI are intact. Mouth is well hydrated and without lesions. NECK: Supple. No masses LUNGS: Clear to auscultation. No presence of rhonchi/wheezing/rales. Adequate chest expansion HEART: RRR, normal s1 and s2. ABDOMEN: Soft, nontender, no guarding, no peritoneal signs, and nondistended. BS +. No masses. EXTREMITIES: Without any cyanosis, clubbing, rash, lesions or edema. NEUROLOGIC: AOx3, no focal motor deficit. SKIN: no jaundice, no rashes   Imaging/Labs: as above  I personally reviewed and interpreted the available labs, imaging and endoscopic files.  Impression and Plan: Taylor Singleton is a 67 y.o. female with past medical  history of hyperlipidemia, GERD, constipation, rectal prolapse status post surgical repair, who presents for evaluation of flatulence and nausea.  Patient reports that she has presented recurrent flatulence and nausea, which started recently.  She has not made any recent changes in her medications and diet.  Unclear why she has presented this but has not presented any red flag signs.  We discussed the possibility of proceeding with further testing if her symptoms do not improve after receiving her bowel prep for her upcoming screening colonoscopy.  For now, she can start taking Zofran as needed to relieve her symptoms.  If her symptoms persist may need to schedule a SIBO breath test and an EGD to further evaluate her complaints.  -Proceed with scheduled colonoscopy -Start Zofran 4 mg q8h as needed for nausea -If persisting symptoms a week after colonoscopy, will schedule SIBO breath test and EGD  All questions were answered.      Taylor Blazing, MD Gastroenterology and Hepatology [facility] Gastroenterology

## 2023-03-17 NOTE — Patient Instructions (Signed)
Proceed with scheduled colonoscopy Start Zofran 4 mg q8h as needed for nausea If persisting symptoms a week after colonoscopy, will schedule SIBO breath test and EGD

## 2023-03-22 ENCOUNTER — Ambulatory Visit (HOSPITAL_COMMUNITY): Payer: Medicare HMO | Admitting: Anesthesiology

## 2023-03-22 ENCOUNTER — Encounter (HOSPITAL_COMMUNITY)
Admission: RE | Disposition: A | Discharge status: Home / Self Care | Payer: Self-pay | Source: Home / Self Care | Attending: Gastroenterology

## 2023-03-22 ENCOUNTER — Ambulatory Visit (HOSPITAL_COMMUNITY)
Admission: RE | Admit: 2023-03-22 | Discharge: 2023-03-22 | Disposition: A | Discharge status: Home / Self Care | Payer: Medicare HMO | Attending: Gastroenterology | Admitting: Gastroenterology

## 2023-03-22 ENCOUNTER — Ambulatory Visit (HOSPITAL_BASED_OUTPATIENT_CLINIC_OR_DEPARTMENT_OTHER): Payer: Medicare HMO | Admitting: Anesthesiology

## 2023-03-22 ENCOUNTER — Encounter (HOSPITAL_COMMUNITY): Payer: Self-pay | Admitting: Gastroenterology

## 2023-03-22 DIAGNOSIS — K644 Residual hemorrhoidal skin tags: Secondary | ICD-10-CM | POA: Insufficient documentation

## 2023-03-22 DIAGNOSIS — Z1211 Encounter for screening for malignant neoplasm of colon: Secondary | ICD-10-CM | POA: Diagnosis not present

## 2023-03-22 DIAGNOSIS — K5730 Diverticulosis of large intestine without perforation or abscess without bleeding: Secondary | ICD-10-CM | POA: Insufficient documentation

## 2023-03-22 DIAGNOSIS — K219 Gastro-esophageal reflux disease without esophagitis: Secondary | ICD-10-CM | POA: Insufficient documentation

## 2023-03-22 DIAGNOSIS — K648 Other hemorrhoids: Secondary | ICD-10-CM | POA: Insufficient documentation

## 2023-03-22 DIAGNOSIS — E785 Hyperlipidemia, unspecified: Secondary | ICD-10-CM | POA: Insufficient documentation

## 2023-03-22 HISTORY — PX: COLONOSCOPY WITH PROPOFOL: SHX5780

## 2023-03-22 LAB — HM COLONOSCOPY

## 2023-03-22 SURGERY — COLONOSCOPY WITH PROPOFOL
Anesthesia: General

## 2023-03-22 MED ORDER — LACTATED RINGERS IV SOLN: INTRAVENOUS | Status: DC

## 2023-03-22 MED ORDER — LIDOCAINE HCL (CARDIAC) PF 100 MG/5ML IV SOSY
PREFILLED_SYRINGE | INTRAVENOUS | Status: DC | PRN
  Administered 2023-03-22: 50 mg via INTRAVENOUS

## 2023-03-22 MED ORDER — PROPOFOL 10 MG/ML IV BOLUS
INTRAVENOUS | Status: DC | PRN
  Administered 2023-03-22: 100 mg via INTRAVENOUS

## 2023-03-22 MED ORDER — PROPOFOL 500 MG/50ML IV EMUL
INTRAVENOUS | Status: DC | PRN
  Administered 2023-03-22: 150 ug/kg/min via INTRAVENOUS

## 2023-03-22 NOTE — Anesthesia Procedure Notes (Signed)
Date/Time: 03/22/2023 8:14 AM  Performed by: Julian Reil, CRNAPre-anesthesia Checklist: Patient identified, Emergency Drugs available, Suction available and Patient being monitored Patient Re-evaluated:Patient Re-evaluated prior to induction Oxygen Delivery Method: Nasal cannula Induction Type: IV induction Placement Confirmation: positive ETCO2

## 2023-03-22 NOTE — Interval H&P Note (Signed)
History and Physical Interval Note:  03/22/2023 7:35 AM  Taylor Singleton  has presented today for surgery, with the diagnosis of SCREENING.  The various methods of treatment have been discussed with the patient and family. After consideration of risks, benefits and other options for treatment, the patient has consented to  Procedure(s) with comments: COLONOSCOPY WITH PROPOFOL (N/A) - 8:45AM;ASA 1 as a surgical intervention.  The patient's history has been reviewed, patient examined, no change in status, stable for surgery.  I have reviewed the patient's chart and labs.  Questions were answered to the patient's satisfaction.     Katrinka Blazing [place]

## 2023-03-22 NOTE — Transfer of Care (Signed)
Immediate Anesthesia Transfer of Care Note  Patient: Taylor Singleton  Procedure(s) Performed: COLONOSCOPY WITH PROPOFOL  Patient Location: Short Stay  Anesthesia Type:General  Level of Consciousness: awake, alert , and oriented  Airway & Oxygen Therapy: Patient Spontanous Breathing  Post-op Assessment: Report given to RN and Post -op Vital signs reviewed and stable  Post vital signs: Reviewed and stable  Last Vitals:  Vitals Value Taken Time  BP    Temp    Pulse 82 03/22/23 0843  Resp 11 03/22/23 0843  SpO2 100 % 03/22/23 0843  Vitals shown include unfiled device data.  Last Pain:  Vitals:   03/22/23 0813  TempSrc:   PainSc: 0-No pain         Complications: No notable events documented.

## 2023-03-22 NOTE — Anesthesia Preprocedure Evaluation (Signed)
Anesthesia Evaluation  Patient identified by MRN, date of birth, ID band Patient awake    Reviewed: Allergy & Precautions, H&P , NPO status , Patient's Chart, lab work & pertinent test results, reviewed documented beta blocker date and time   Airway Mallampati: II  TM Distance: >3 FB Neck ROM: full    Dental no notable dental hx.    Pulmonary neg pulmonary ROS   Pulmonary exam normal breath sounds clear to auscultation       Cardiovascular Exercise Tolerance: Good negative cardio ROS  Rhythm:regular Rate:Normal     Neuro/Psych  Neuromuscular disease negative neurological ROS  negative psych ROS   GI/Hepatic negative GI ROS, Neg liver ROS,GERD  ,,  Endo/Other  negative endocrine ROS    Renal/GU negative Renal ROS  negative genitourinary   Musculoskeletal   Abdominal   Peds  Hematology negative hematology ROS (+)   Anesthesia Other Findings   Reproductive/Obstetrics negative OB ROS                             Anesthesia Physical Anesthesia Plan  ASA: 2  Anesthesia Plan: General   Post-op Pain Management:    Induction:   PONV Risk Score and Plan: Propofol infusion  Airway Management Planned:   Additional Equipment:   Intra-op Plan:   Post-operative Plan:   Informed Consent: I have reviewed the patients History and Physical, chart, labs and discussed the procedure including the risks, benefits and alternatives for the proposed anesthesia with the patient or authorized representative who has indicated his/her understanding and acceptance.     Dental Advisory Given  Plan Discussed with: CRNA  Anesthesia Plan Comments:        Anesthesia Quick Evaluation

## 2023-03-22 NOTE — Discharge Instructions (Signed)
You are being discharged to home.  Resume your previous diet.  Your physician has recommended a repeat colonoscopy in 10 years for screening purposes.  

## 2023-03-22 NOTE — Op Note (Signed)
[facility] Patient Name: Taylor Singleton Procedure Date: 03/22/2023 8:00 AM MRN: [id number] Date of Birth: [birth year] Attending MD: Katrinka Blazing , , [id number] CSN: [id number] Age: 67 Admit Type: Outpatient Procedure:                Colonoscopy Indications:              Screening for colorectal malignant neoplasm Providers:                Katrinka Blazing, Angelica Ran, Zena Amos Referring MD:              Medicines:                Monitored Anesthesia Care Complications:            No immediate complications. Estimated Blood Loss:     Estimated blood loss: none. Procedure:                Pre-Anesthesia Assessment:                           - Prior to the procedure, a History and Physical                            was performed, and patient medications, allergies                            and sensitivities were reviewed. The patient's                            tolerance of previous anesthesia was reviewed.                           - The risks and benefits of the procedure and the                            sedation options and risks were discussed with the                            patient. All questions were answered and informed                            consent was obtained.                           - ASA Grade Assessment: II - A patient with mild                            systemic disease.                           After obtaining informed consent, the colonoscope                            was passed under direct vision. Throughout the                            procedure, the patient's blood pressure,  pulse, and                            oxygen saturations were monitored continuously. The                            [id number]-[id number] ([id number]) scope was introduced through                            the anus and advanced to the the cecum, identified                            by appendiceal orifice and ileocecal valve. The                            colonoscopy was  performed without difficulty. The                            patient tolerated the procedure well. The quality                            of the bowel preparation was excellent. Scope In: 8:20:30 AM Scope Out: 8:39:58 AM Scope Withdrawal Time: 0 hours 10 minutes 51 seconds  Total Procedure Duration: 0 hours 19 minutes 28 seconds  Findings:      External hemorrhoids were found on perianal exam.      Scattered small-mouthed diverticula were found in the sigmoid colon and       descending colon.      Non-bleeding internal hemorrhoids were found during retroflexion. The       hemorrhoids were medium-sized. Impression:               - External hemorrhoids found on perianal exam.                           - Diverticulosis in the sigmoid colon and in the                            descending colon.                           - Non-bleeding internal hemorrhoids.                           - No specimens collected. Moderate Sedation:      Per Anesthesia Care Recommendation:           - Discharge patient to home (ambulatory).                           - Resume previous diet.                           - Repeat colonoscopy in 10 years for screening                            purposes. Procedure Code(s):        ---  Professional ---                           B1478, Colorectal cancer screening; colonoscopy on                            individual not meeting criteria for high risk Diagnosis Code(s):        --- Professional ---                           Z12.11, Encounter for screening for malignant                            neoplasm of colon                           K64.8, Other hemorrhoids                           K57.30, Diverticulosis of large intestine without                            perforation or abscess without bleeding CPT copyright 2022 American [employer] Association. All rights reserved. The codes documented in this report are preliminary and upon coder review may  be revised to meet  current compliance requirements. Katrinka Blazing, MD Katrinka Blazing,  03/22/2023 8:48:07 AM This report has been signed electronically. Number of Addenda: 0

## 2023-03-23 ENCOUNTER — Encounter (INDEPENDENT_AMBULATORY_CARE_PROVIDER_SITE_OTHER): Payer: Self-pay | Admitting: *Deleted

## 2023-03-25 NOTE — Anesthesia Postprocedure Evaluation (Signed)
Anesthesia Post Note  Patient: Taylor Singleton  Procedure(s) Performed: COLONOSCOPY WITH PROPOFOL  Patient location during evaluation: Phase II Anesthesia Type: General Level of consciousness: awake Pain management: pain level controlled Vital Signs Assessment: post-procedure vital signs reviewed and stable Respiratory status: spontaneous breathing and respiratory function stable Cardiovascular status: blood pressure returned to baseline and stable Postop Assessment: no headache and no apparent nausea or vomiting Anesthetic complications: no Comments: Late entry   No notable events documented.   Last Vitals:  Vitals:   03/22/23 0719 03/22/23 0843  BP: (!) 151/66 (!) 113/52  Pulse: 82 84  Resp: 19 19  Temp: 36.6 C 36.6 C  SpO2: 95% 100%    Last Pain:  Vitals:   03/22/23 0843  TempSrc: Oral  PainSc: 0-No pain                 Windell [place]

## 2023-03-30 ENCOUNTER — Encounter (HOSPITAL_COMMUNITY): Payer: Self-pay | Admitting: Gastroenterology

## 2023-04-02 DIAGNOSIS — R69 Illness, unspecified: Secondary | ICD-10-CM | POA: Diagnosis not present

## 2023-04-04 DIAGNOSIS — M48061 Spinal stenosis, lumbar region without neurogenic claudication: Secondary | ICD-10-CM | POA: Diagnosis not present

## 2023-04-04 DIAGNOSIS — M5416 Radiculopathy, lumbar region: Secondary | ICD-10-CM | POA: Diagnosis not present

## 2023-04-04 DIAGNOSIS — M48062 Spinal stenosis, lumbar region with neurogenic claudication: Secondary | ICD-10-CM | POA: Diagnosis not present

## 2023-04-15 ENCOUNTER — Ambulatory Visit (INDEPENDENT_AMBULATORY_CARE_PROVIDER_SITE_OTHER): Payer: Medicare HMO | Admitting: Family Medicine

## 2023-04-15 ENCOUNTER — Encounter: Payer: Self-pay | Admitting: Family Medicine

## 2023-04-15 VITALS — BP 124/72 | HR 87 | Temp 98.3°F | Ht 61.0 in | Wt 162.0 lb

## 2023-04-15 DIAGNOSIS — R110 Nausea: Secondary | ICD-10-CM | POA: Diagnosis not present

## 2023-04-15 NOTE — Progress Notes (Signed)
Subjective:    Patient ID: Taylor Singleton, female    DOB: 10-09-1955, 67 y.o.   MRN: [id number] 03/01/23 Patient is a very pleasant 67 year old Caucasian female who is here today complaining of dry mouth.  We discussed this at her last visit.  She is very anxious about this.  She admits that she worries excessively.  She states that little things get to her easily and cause her excessive amounts of worry.  She feels anxious all the time.  She tends to perseverate over physical symptoms.  She denies depression.  She denies panic attacks.  She does complain of globus sensation.  At that time, my plan was: Begin Lexapro 10 mg a day and recheck in 6 weeks  04/15/23 Patient has not seen any benefit from the Lexapro.  However over the last 6 weeks, the patient has developed almost daily nausea.  She denies any melena.  She denies any hematochezia.  She denies any vomiting.  She denies any specific triggers.  She denies any weight loss.  She denies any fevers or chills.  She denies any diarrhea.  She denies any red flag symptoms.  She has spoke with the GI doctor about this. 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    Varicose vein of leg    Wears glasses    Past Surgical History:  Procedure Laterality Date   ANTERIOR AND POSTERIOR REPAIR WITH SACROSPINOUS FIXATION N/A 07/19/2022   Procedure: POSTERIOR REPAIR WITHP PERINEORRHAPHY AND  SACROSPINOUS FIXATION;  Surgeon: Marguerita Beards, MD;  Location: [facility];  Service: Gynecology;  Laterality: N/A;  Total time requested is 1.5hrs   BREAST CYST EXCISION Left    CHOLECYSTECTOMY     CHOLECYSTECTOMY, LAPAROSCOPIC  2010   COLONOSCOPY WITH PROPOFOL N/A 03/22/2023   Procedure: COLONOSCOPY WITH  PROPOFOL;  Surgeon: Dolores Frame, MD;  [facility]: [facility];  Service: Gastroenterology;  Laterality: N/A;  8:45AM;ASA 1   TOTAL ABDOMINAL HYSTERECTOMY W/ BILATERAL SALPINGOOPHORECTOMY     1990s   Current Outpatient Medications on File Prior to Visit  Medication Sig Dispense Refill   atorvastatin (LIPITOR) 40 MG tablet Take 1 tablet (40 mg total) by mouth daily. 90 tablet 2   Calcium Carb-Cholecalciferol (CALCIUM/VITAMIN D) 600-400 MG-UNIT TABS Take 2 tablets by mouth daily.     escitalopram (LEXAPRO) 10 MG tablet Take 1 tablet (10 mg total) by mouth daily. 30 tablet 3   ondansetron (ZOFRAN) 4 MG tablet Take 1 tablet (4 mg total) by mouth every 8 (eight) hours as needed for nausea or vomiting. 30 tablet 1   OVER THE COUNTER MEDICATION Take 1 tablet by mouth daily. Digestive health one gummy daily.     pantoprazole (PROTONIX) 40 MG tablet Take 1 tablet (40 mg total) by mouth daily. 90 tablet 1   No current facility-administered medications on file prior to visit.   No Known Allergies Social History   Socioeconomic History   Marital status: Married    Spouse name: Not on file   Number of children: 2   Years of education: Not on file   Highest education level: Not on file  Occupational History   Not on file  Tobacco Use  Smoking status: Never   Smokeless tobacco: Never  Vaping Use   Vaping status: Never Used  Substance and Sexual Activity   Alcohol use: No   Drug use: Never   Sexual activity: Not Currently    Birth control/protection: Surgical    Comment: hyst  Other Topics Concern   Not on file  Social History Narrative   Not on file   Social Determinants of Health   Financial Resource Strain: Low Risk  (05/25/2022)   Overall Financial Resource Strain (CARDIA)    Difficulty of Paying Living Expenses: Not hard at all  Food Insecurity: No Food Insecurity (05/25/2022)   Hunger Vital Sign    Worried About Running Out of Food in the Last Year: Never true     Ran Out of Food in the Last Year: Never true  Transportation Needs: No Transportation Needs (05/25/2022)   PRAPARE - Administrator, Civil Service (Medical): No    Lack of Transportation (Non-Medical): No  Physical Activity: Insufficiently Active (05/25/2022)   Exercise Vital Sign    Days of Exercise per Week: 3 days    Minutes of Exercise per Session: 30 min  Stress: No Stress Concern Present (05/25/2022)   [employer] of Occupational Health - Occupational Stress Questionnaire    Feeling of Stress : Not at all  Social Connections: Moderately Isolated (05/25/2022)   Social Connection and Isolation Panel [NHANES]    Frequency of Communication with Friends and Family: Twice a week    Frequency of Social Gatherings with Friends and Family: Three times a week    Attends Religious Services: Never    Active Member of Clubs or Organizations: No    Attends Banker Meetings: Never    Marital Status: Married  Catering manager Violence: Not At Risk (05/25/2022)   Humiliation, Afraid, Rape, and Kick questionnaire    Fear of Current or Ex-Partner: No    Emotionally Abused: No    Physically Abused: No    Sexually Abused: No     Review of Systems  All other systems reviewed and are negative.      Objective:   Physical Exam Constitutional:      General: She is not in acute distress.    Appearance: Normal appearance. She is well-developed and normal weight. She is not ill-appearing or toxic-appearing.  HENT:     Right Ear: Tympanic membrane and ear canal normal.     Left Ear: Tympanic membrane and ear canal normal.     Nose: Nose normal.     Mouth/Throat:     Mouth: Mucous membranes are moist.     Pharynx: Oropharynx is clear. No oropharyngeal exudate or posterior oropharyngeal erythema.  Eyes:     Extraocular Movements: Extraocular movements intact.     Conjunctiva/sclera: Conjunctivae normal.     Pupils: Pupils are equal, round, and reactive to light.   Cardiovascular:     Rate and Rhythm: Normal rate and regular rhythm.     Heart sounds: Normal heart sounds.  Pulmonary:     Effort: Pulmonary effort is normal. No respiratory distress.     Breath sounds: No wheezing or rales.  Musculoskeletal:     Cervical back: Neck supple.  Lymphadenopathy:     Cervical: No cervical adenopathy.  Skin:    Findings: No rash.  Neurological:     Mental Status: She is alert and oriented to person, place, and time.     Cranial Nerves: No cranial nerve deficit, dysarthria  or facial asymmetry.     Sensory: No sensory deficit.     Motor: No weakness, tremor, atrophy, abnormal muscle tone or seizure activity.     Coordination: Romberg sign negative. Coordination normal. Finger-Nose-Finger Test normal.     Gait: Gait normal.     Deep Tendon Reflexes: Reflexes normal.  Psychiatric:        Mood and Affect: Mood normal.        Behavior: Behavior normal.        Thought Content: Thought content normal.        Judgment: Judgment normal.           Assessment & Plan:  Nausea Before instituting a workup, I will have her discontinue Lexapro.  The nausea began around the same time.  We will see if over the next week or so if the patient notices the nausea improving

## 2023-04-22 ENCOUNTER — Ambulatory Visit: Payer: Medicare HMO | Admitting: Family Medicine

## 2023-04-22 ENCOUNTER — Other Ambulatory Visit: Payer: Self-pay | Admitting: Family Medicine

## 2023-04-22 VITALS — BP 120/72 | HR 96 | Temp 97.7°F | Ht 61.0 in | Wt 159.0 lb

## 2023-04-22 DIAGNOSIS — R110 Nausea: Secondary | ICD-10-CM | POA: Diagnosis not present

## 2023-04-22 NOTE — Progress Notes (Signed)
Subjective:    Patient ID: Taylor Singleton, female    DOB: 1956-03-25, 67 y.o.   MRN: [id number] 03/01/23 Patient is a very pleasant 67 year old Caucasian female who is here today complaining of dry mouth.  We discussed this at her last visit.  She is very anxious about this.  She admits that she worries excessively.  She states that little things get to her easily and cause her excessive amounts of worry.  She feels anxious all the time.  She tends to perseverate over physical symptoms.  She denies depression.  She denies panic attacks.  She does complain of globus sensation.  At that time, my plan was: Begin Lexapro 10 mg a day and recheck in 6 weeks  04/15/23 Patient has not seen any benefit from the Lexapro.  However over the last 6 weeks, the patient has developed almost daily nausea.  She denies any melena.  She denies any hematochezia.  She denies any vomiting.  She denies any specific triggers.  She denies any weight loss.  She denies any fevers or chills.  She denies any diarrhea.  She denies any red flag symptoms.  She has spoke with the GI doctor about this.  At that time, my plan was: Before instituting a workup, I will have her discontinue Lexapro.  The nausea began around the same time.  We will see if over the next week or so if the patient notices the nausea improving  04/22/23 Patient continues to report nausea on a daily basis.  She denies any reflux.  She denies any epigastric pain.  She denies any melena.  She denies any hematochezia or hematemesis.  She denies any weight loss.  She denies any fevers chills changes to have no red flag symptoms.  However she is very concerned about the cause of the nausea.  She states that she wants to evaluate this further.  Her gastroenterologist had recommended an EGD.  However she would like to get a second opinion with a another gastroenterologist.  She would like to see [place]. Past Medical History:  Diagnosis Date   Arthritis    Chronic  idiopathic constipation    GERD (gastroesophageal reflux disease)    HLD (hyperlipidemia)    Hyperlipidemia    Osteoporosis    Prolapse of posterior vaginal wall    Sciatica    Vaginal vault 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    Varicose vein of leg    Wears glasses    Past Surgical History:  Procedure Laterality Date   ANTERIOR AND POSTERIOR REPAIR WITH SACROSPINOUS FIXATION N/A 07/19/2022   Procedure: POSTERIOR REPAIR WITHP PERINEORRHAPHY AND  SACROSPINOUS FIXATION;  Surgeon: Marguerita Beards, MD;  Location: [facility];  Service: Gynecology;  Laterality: N/A;  Total time requested is 1.5hrs   BREAST CYST EXCISION Left    CHOLECYSTECTOMY     CHOLECYSTECTOMY, LAPAROSCOPIC  2010   COLONOSCOPY WITH PROPOFOL N/A 03/22/2023   Procedure: COLONOSCOPY WITH PROPOFOL;  Surgeon: Dolores Frame, MD;  [facility]: [facility];  Service: Gastroenterology;  Laterality: N/A;  8:45AM;ASA 1   TOTAL ABDOMINAL HYSTERECTOMY W/ BILATERAL SALPINGOOPHORECTOMY     1990s   Current Outpatient Medications on File Prior to Visit  Medication Sig Dispense Refill   atorvastatin (LIPITOR) 40 MG tablet Take 1 tablet (40 mg total) by mouth daily. 90 tablet 2   Calcium Carb-Cholecalciferol (CALCIUM/VITAMIN D) 600-400 MG-UNIT TABS Take 2 tablets by mouth daily.     escitalopram (LEXAPRO) 10 MG tablet Take  1 tablet (10 mg total) by mouth daily. 30 tablet 3   ondansetron (ZOFRAN) 4 MG tablet Take 1 tablet (4 mg total) by mouth every 8 (eight) hours as needed for nausea or vomiting. 30 tablet 1   OVER THE COUNTER MEDICATION Take 1 tablet by mouth daily. Digestive health one gummy daily.     pantoprazole (PROTONIX) 40 MG tablet Take 1 tablet (40 mg total) by mouth daily. 90 tablet 1   No current facility-administered medications on file prior to visit.   No Known Allergies Social History   Socioeconomic History   Marital status: Married    Spouse name: Not on file   Number of children: 2    Years of education: Not on file   Highest education level: Not on file  Occupational History   Not on file  Tobacco Use   Smoking status: Never   Smokeless tobacco: Never  Vaping Use   Vaping status: Never Used  Substance and Sexual Activity   Alcohol use: No   Drug use: Never   Sexual activity: Not Currently    Birth control/protection: Surgical    Comment: hyst  Other Topics Concern   Not on file  Social History Narrative   Not on file   Social Determinants of Health   Financial Resource Strain: Low Risk  (05/25/2022)   Overall Financial Resource Strain (CARDIA)    Difficulty of Paying Living Expenses: Not hard at all  Food Insecurity: No Food Insecurity (05/25/2022)   Hunger Vital Sign    Worried About Running Out of Food in the Last Year: Never true    Ran Out of Food in the Last Year: Never true  Transportation Needs: No Transportation Needs (05/25/2022)   PRAPARE - Administrator, Civil Service (Medical): No    Lack of Transportation (Non-Medical): No  Physical Activity: Insufficiently Active (05/25/2022)   Exercise Vital Sign    Days of Exercise per Week: 3 days    Minutes of Exercise per Session: 30 min  Stress: No Stress Concern Present (05/25/2022)   [employer] of Occupational Health - Occupational Stress Questionnaire    Feeling of Stress : Not at all  Social Connections: Moderately Isolated (05/25/2022)   Social Connection and Isolation Panel [NHANES]    Frequency of Communication with Friends and Family: Twice a week    Frequency of Social Gatherings with Friends and Family: Three times a week    Attends Religious Services: Never    Active Member of Clubs or Organizations: No    Attends Banker Meetings: Never    Marital Status: Married  Catering manager Violence: Not At Risk (05/25/2022)   Humiliation, Afraid, Rape, and Kick questionnaire    Fear of Current or Ex-Partner: No    Emotionally Abused: No    Physically  Abused: No    Sexually Abused: No     Review of Systems  All other systems reviewed and are negative.      Objective:   Physical Exam Constitutional:      General: She is not in acute distress.    Appearance: Normal appearance. She is well-developed and normal weight. She is not ill-appearing or toxic-appearing.  HENT:     Right Ear: Tympanic membrane and ear canal normal.     Left Ear: Tympanic membrane and ear canal normal.     Nose: Nose normal.     Mouth/Throat:     Mouth: Mucous membranes are moist.  Pharynx: Oropharynx is clear. No oropharyngeal exudate or posterior oropharyngeal erythema.  Eyes:     Extraocular Movements: Extraocular movements intact.     Conjunctiva/sclera: Conjunctivae normal.     Pupils: Pupils are equal, round, and reactive to light.  Cardiovascular:     Rate and Rhythm: Normal rate and regular rhythm.     Heart sounds: Normal heart sounds.  Pulmonary:     Effort: Pulmonary effort is normal. No respiratory distress.     Breath sounds: No wheezing or rales.  Musculoskeletal:     Cervical back: Neck supple.  Lymphadenopathy:     Cervical: No cervical adenopathy.  Skin:    Findings: No rash.  Neurological:     Mental Status: She is alert and oriented to person, place, and time.     Cranial Nerves: No cranial nerve deficit, dysarthria or facial asymmetry.     Sensory: No sensory deficit.     Motor: No weakness, tremor, atrophy, abnormal muscle tone or seizure activity.     Coordination: Romberg sign negative. Coordination normal. Finger-Nose-Finger Test normal.     Gait: Gait normal.     Deep Tendon Reflexes: Reflexes normal.  Psychiatric:        Mood and Affect: Mood normal.        Behavior: Behavior normal.        Thought Content: Thought content normal.        Judgment: Judgment normal.           Assessment & Plan:  Nausea Patient is very anxious regarding her nausea.  Her gastroenterologist had recommended an EGD.  However she  would like to get a second opinion with another gastroenterologist.  Therefore I will place a referral for her to get a second opinion.  I suspect that the patient is likely dealing with IBS.

## 2023-05-04 ENCOUNTER — Ambulatory Visit
Admission: EM | Admit: 2023-05-04 | Discharge: 2023-05-04 | Disposition: A | Discharge status: Home / Self Care | Payer: Medicare HMO | Attending: Family Medicine | Admitting: Family Medicine

## 2023-05-04 DIAGNOSIS — J22 Unspecified acute lower respiratory infection: Secondary | ICD-10-CM

## 2023-05-04 MED ORDER — AMOXICILLIN-POT CLAVULANATE 875-125 MG PO TABS: 1.0000 | ORAL_TABLET | Freq: Two times a day (BID) | ORAL | 0 refills | Status: DC

## 2023-05-04 MED ORDER — PROMETHAZINE-DM 6.25-15 MG/5ML PO SYRP: 5.0000 mL | ORAL_SOLUTION | Freq: Four times a day (QID) | ORAL | 0 refills | Status: DC | PRN

## 2023-05-04 NOTE — Discharge Instructions (Signed)
In addition to the prescribed medications, you may take Delsym during the day, Mucinex twice daily, use humidifiers, Flonase nasal spray and other supportive care.

## 2023-05-04 NOTE — ED Provider Notes (Signed)
[facility]    CSN: [id number] Arrival date & time: 05/04/23  1551      History   Chief Complaint No chief complaint on file.   HPI Taylor Singleton is a 67 y.o. female.   Presenting today with 1 week history of progressively worsening cough, congestion, chest heaviness.  Denies fever, chills, shortness of breath no abdominal pain, nausea vomiting or diarrhea.  Taking over-the-counter DayQuil NyQuil type medications with minimal relief.  No known history of chronic pulmonary disease.    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    Varicose vein of leg    Wears glasses     Patient Active Problem List   Diagnosis Date Noted   Encounter for colorectal cancer screening 03/22/2023   Nausea without vomiting 03/17/2023   Flatulence 03/17/2023   S/P hysterectomy 10/06/2022   Urinary frequency 10/06/2022   Vaginal burning 10/06/2022   Vaginal irritation 10/06/2022   Vulvar irritation 10/06/2022   Sebaceous cyst of labia 10/06/2022   BPPV (benign paroxysmal positional vertigo), left 01/29/2022   Laryngopharyngeal reflux (LPR) 01/29/2022   Pharyngoesophageal dysphagia 01/29/2022   HLD (hyperlipidemia)     Past Surgical History:  Procedure Laterality Date   ANTERIOR AND POSTERIOR REPAIR WITH SACROSPINOUS FIXATION N/A 07/19/2022   Procedure: POSTERIOR REPAIR WITHP PERINEORRHAPHY AND  SACROSPINOUS FIXATION;  Surgeon: Marguerita Beards, MD;  Location: [facility];  Service: Gynecology;  Laterality: N/A;  Total time requested is 1.5hrs   BREAST CYST EXCISION Left    CHOLECYSTECTOMY     CHOLECYSTECTOMY, LAPAROSCOPIC  2010   COLONOSCOPY WITH PROPOFOL N/A 03/22/2023   Procedure: COLONOSCOPY WITH PROPOFOL;  Surgeon:  Dolores Frame, MD;  Location: [facility];  Service: Gastroenterology;  Laterality: N/A;  8:45AM;ASA 1   TOTAL ABDOMINAL HYSTERECTOMY W/ BILATERAL SALPINGOOPHORECTOMY     1990s    OB History     Gravida  2   Para  2   Term  2   Preterm      AB      Living  2      SAB      IAB      Ectopic      Multiple      Live Births  2            Home Medications    Prior to Admission medications   Medication Sig Start Date End Date Taking? Authorizing Provider  amoxicillin-clavulanate (AUGMENTIN) 875-125 MG tablet Take 1 tablet by mouth every 12 (twelve) hours. 05/04/23  Yes Particia Nearing, PA-C  promethazine-dextromethorphan (PROMETHAZINE-DM) 6.25-15 MG/5ML syrup Take 5 mLs by mouth 4 (four) times daily as needed. 05/04/23  Yes Particia Nearing, PA-C  atorvastatin (LIPITOR) 40 MG tablet Take 1 tablet (40 mg total) by mouth daily. 03/14/23   Donita Brooks, MD  Calcium Carb-Cholecalciferol (CALCIUM/VITAMIN D) 600-400 MG-UNIT TABS Take 2 tablets by mouth daily.    [provider]  escitalopram (LEXAPRO) 10 MG tablet Take 1 tablet (10 mg total) by mouth daily. 03/01/23   Donita Brooks, MD  ondansetron (ZOFRAN) 4 MG tablet Take 1 tablet (4 mg total) by mouth every 8 (eight) hours as needed for  nausea or vomiting. 03/17/23   Dolores Frame, MD  OVER THE COUNTER MEDICATION Take 1 tablet by mouth daily. Digestive health one gummy daily.    [provider]  pantoprazole (PROTONIX) 40 MG tablet Take 1 tablet (40 mg total) by mouth daily. 02/15/23   Donita Brooks, MD    Family History Family History  Problem Relation Age of Onset   Diabetes Mother    Heart attack Brother     Social History Social History   Tobacco Use   Smoking status: Never   Smokeless tobacco: Never  Vaping Use   Vaping status: Never Used  Substance Use Topics   Alcohol use: No   Drug use: Never     Allergies   Patient has no known  allergies.   Review of Systems Review of Systems PER HPI  Physical Exam Triage Vital Signs ED Triage Vitals  Encounter Vitals Group     BP 05/04/23 1610 120/77     Systolic BP Percentile --      Diastolic BP Percentile --      Pulse Rate 05/04/23 1610 93     Resp 05/04/23 1610 15     Temp 05/04/23 1610 98.6 F (37 C)     Temp Source 05/04/23 1610 Oral     SpO2 05/04/23 1610 94 %     Weight --      Height --      Head Circumference --      Peak Flow --      Pain Score 05/04/23 1614 0     Pain Loc --      Pain Education --      Exclude from Growth Chart --    No data found.  Updated Vital Signs BP 120/77 (BP Location: Right Arm)   Pulse 93   Temp 98.6 F (37 C) (Oral)   Resp 15   SpO2 94%   Visual Acuity Right Eye Distance:   Left Eye Distance:   Bilateral Distance:    Right Eye Near:   Left Eye Near:    Bilateral Near:     Physical Exam Vitals and nursing note reviewed.  Constitutional:      Appearance: Normal appearance.  HENT:     Head: Atraumatic.     Right Ear: Tympanic membrane and external ear normal.     Left Ear: Tympanic membrane and external ear normal.     Nose: Congestion present.     Mouth/Throat:     Mouth: Mucous membranes are moist.     Pharynx: Posterior oropharyngeal erythema present.  Eyes:     Extraocular Movements: Extraocular movements intact.     Conjunctiva/sclera: Conjunctivae normal.  Cardiovascular:     Rate and Rhythm: Normal rate and regular rhythm.     Heart sounds: Normal heart sounds.  Pulmonary:     Effort: Pulmonary effort is normal.     Breath sounds: Wheezing and rales present.  Musculoskeletal:        General: Normal range of motion.     Cervical back: Normal range of motion and neck supple.  Skin:    General: Skin is warm and dry.  Neurological:     Mental Status: She is alert and oriented to person, place, and time.  Psychiatric:        Mood and Affect: Mood normal.        Thought Content: Thought  content normal.      UC Treatments / Results  Labs (all labs ordered are listed, but only abnormal results are displayed) Labs Reviewed - No data to display  EKG   Radiology No results found.  Procedures Procedures (including critical care time)  Medications Ordered in UC Medications - No data to display  Initial Impression / Assessment and Plan / UC Course  I have reviewed the triage vital signs and the nursing notes.  Pertinent labs & imaging results that were available during my care of the patient were reviewed by me and considered in my medical decision making (see chart for details).     Given duration, worsening course and exam findings will cover with Augmentin, Phenergan DM, supportive over-the-counter medications and home care.  Return for worsening symptoms.  Final Clinical Impressions(s) / UC Diagnoses   Final diagnoses:  Lower respiratory infection     Discharge Instructions      In addition to the prescribed medications, you may take Delsym during the day, Mucinex twice daily, use humidifiers, Flonase nasal spray and other supportive care.    ED Prescriptions     Medication Sig Dispense Auth. Provider   amoxicillin-clavulanate (AUGMENTIN) 875-125 MG tablet Take 1 tablet by mouth every 12 (twelve) hours. 14 tablet Particia Nearing, [place]   promethazine-dextromethorphan (PROMETHAZINE-DM) 6.25-15 MG/5ML syrup Take 5 mLs by mouth 4 (four) times daily as needed. 100 mL Particia Nearing, [place]      PDMP not reviewed this encounter.   Particia Nearing, [place] 05/04/23 1644

## 2023-05-04 NOTE — ED Triage Notes (Signed)
Pt reports cough, congestion x 1 week, states she feels a if it has settle into her chest. Coughing up phlegm that is mucus like.

## 2023-05-06 ENCOUNTER — Telehealth: Payer: Self-pay | Admitting: Family Medicine

## 2023-05-06 ENCOUNTER — Ambulatory Visit: Payer: Self-pay | Admitting: Family Medicine

## 2023-05-06 NOTE — Telephone Encounter (Signed)
Copied from CRM [id number]. Topic: Clinical - Pink Word Triage >> May 06, 2023  2:44 PM Dennison Nancy wrote: Reason for Triage: Pink word (diarrhea )patient went to urgent care on Thursday 05/05/23  because there was no available appointments for lower respiratory infection and was given prescription promethazine dm 62.5 mg , patient believe one of the medication is causing her to have diarrhea  amocillin /clav 875/125 mg tablet . Patient was advise to go back to the urgent care to request for doctor to put on a different medication , if she can not wait for her upcoming appointment on 05/10/23

## 2023-05-06 NOTE — Telephone Encounter (Signed)
Attempt made to call patient back:no answer: left message and left call back; will attempt to call patient back at another time

## 2023-05-06 NOTE — Telephone Encounter (Signed)
  Chief Complaint: Medication Question Symptoms: Diarrhea Frequency: Comes and goes Disposition: [] ED /[] Urgent Care (no appt availability in office) / [] Appointment(In office/virtual)/ []  [facility]/ [x] Home Care/ [] Refused Recommended Disposition /[] [facility]/ []  Follow-up with PCP Additional Notes: Patient called in with questions regarding Amoxicllin.  Patient was prescribed this at an urgent care and states she was having diarrhea as a results. Pt has an appt scheduled in Office 11/26 and verbalized that she hasn't had a diarrhea episode since 2pm.  Pt with questions if she should still take Amoxicillin as prescribed.  This RN advised that diarrhea can be a side effect of Amoxicillin and that patient should take medication as prescribed as long as she is not having any worsening symptoms.  Patient verbalized understanding. Pt stated that she had been told previously to go to urgent care if her worsened before her appt due to no earlier appointment availability.  Reason for Disposition  Caller has medicine question, adult has minor symptoms, caller declines triage, AND triager answers question  Answer Assessment - Initial Assessment Questions 1. NAME of MEDICINE: "What medicine(s) are you calling about?"     Amoxicillin 2. QUESTION: "What is your question?" (e.g., double dose of medicine, side effect)     Can this cause diarrhea 3. PRESCRIBER: "Who prescribed the medicine?" Reason: if prescribed by specialist, call should be referred to that group.     Unknown--urgent care 4. SYMPTOMS: "Do you have any symptoms?" If Yes, ask: "What symptoms are you having?"  "How bad are the symptoms (e.g., mild, moderate, severe)     Diarrhea--but patient states she hasn't had any since 2pm.  Protocols used: Medication Question Call-A-AH

## 2023-05-09 ENCOUNTER — Ambulatory Visit: Payer: Medicare HMO | Admitting: Obstetrics & Gynecology

## 2023-05-10 ENCOUNTER — Ambulatory Visit: Payer: Medicare HMO | Admitting: Family Medicine

## 2023-05-24 ENCOUNTER — Telehealth: Payer: Self-pay

## 2023-05-24 NOTE — Telephone Encounter (Signed)
Pt is calling to see if it is time for her labs.

## 2023-05-30 ENCOUNTER — Encounter: Payer: Self-pay | Admitting: Family Medicine

## 2023-05-30 ENCOUNTER — Ambulatory Visit (INDEPENDENT_AMBULATORY_CARE_PROVIDER_SITE_OTHER): Payer: Medicare HMO | Admitting: Family Medicine

## 2023-05-30 VITALS — BP 122/72 | HR 97 | Temp 98.3°F | Ht 61.0 in | Wt 160.0 lb

## 2023-05-30 DIAGNOSIS — N3000 Acute cystitis without hematuria: Secondary | ICD-10-CM | POA: Diagnosis not present

## 2023-05-30 DIAGNOSIS — M25559 Pain in unspecified hip: Secondary | ICD-10-CM | POA: Insufficient documentation

## 2023-05-30 DIAGNOSIS — R32 Unspecified urinary incontinence: Secondary | ICD-10-CM

## 2023-05-30 LAB — URINALYSIS, ROUTINE W REFLEX MICROSCOPIC
Bilirubin Urine: NEGATIVE
Glucose, UA: NEGATIVE
Hyaline Cast: NONE SEEN /[LPF]
Ketones, ur: NEGATIVE
Nitrite: NEGATIVE
Specific Gravity, Urine: 1.02 (ref 1.001–1.035)
pH: 5.5 (ref 5.0–8.0)

## 2023-05-30 LAB — MICROSCOPIC MESSAGE

## 2023-05-30 MED ORDER — CEPHALEXIN 500 MG PO CAPS
500.0000 mg | ORAL_CAPSULE | Freq: Two times a day (BID) | ORAL | 0 refills | Status: DC
Start: 2023-05-30 — End: 2023-08-08

## 2023-05-30 NOTE — Assessment & Plan Note (Signed)
UA with trace leukocytes, hematuria, and few bacteria. Will treat with Keflex BID for 7 days. If symptoms persist or worsen return to office. Will consider pelvic floor PT or referral back to GYN.

## 2023-05-30 NOTE — Progress Notes (Signed)
Subjective:  HPI: Taylor Singleton is a 67 y.o. female presenting on 05/30/2023 for Follow-up (Excess gas and urinary incontinence)   HPI Patient is in today for urge urinary incontinence described as dribbling for about 1.5 months.. This is new for her, she did have surgery for rectal prolapse earlier this year. She also has history of lumbar radiculopathy that is resolved, no saddle numbness or fecal incontinence. Denies dysuria, fever, chills, body aches, itching, vaginal discharge, abdominal or flank pain, or hematuria. She does also report malodorous urine and some incomplete emptying.  Review of Systems  All other systems reviewed and are negative.   Relevant past medical history reviewed and updated as indicated.   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    Varicose vein of leg    Wears glasses      Past Surgical History:  Procedure Laterality Date   ANTERIOR AND POSTERIOR REPAIR WITH SACROSPINOUS FIXATION N/A 07/19/2022   Procedure: POSTERIOR REPAIR WITHP PERINEORRHAPHY AND  SACROSPINOUS FIXATION;  Surgeon: Marguerita Beards, MD;  Location: [facility];  Service: Gynecology;  Laterality: N/A;  Total time requested is 1.5hrs   BREAST CYST EXCISION Left    CHOLECYSTECTOMY     CHOLECYSTECTOMY, LAPAROSCOPIC  2010   COLONOSCOPY WITH PROPOFOL N/A 03/22/2023   Procedure: COLONOSCOPY WITH PROPOFOL;  Surgeon: Dolores Frame, MD;  [facility];  Service: Gastroenterology;  Laterality: N/A;  8:45AM;ASA 1   TOTAL ABDOMINAL HYSTERECTOMY W/ BILATERAL SALPINGOOPHORECTOMY     1990s    Allergies and medications reviewed and updated.   Current Outpatient Medications:    atorvastatin (LIPITOR) 40 MG tablet,  Take 1 tablet (40 mg total) by mouth daily., Disp: 90 tablet, Rfl: 2   Calcium Carb-Cholecalciferol (CALCIUM/VITAMIN D) 600-400 MG-UNIT TABS, Take 2 tablets by mouth daily., Disp: , Rfl:    cephALEXin (KEFLEX) 500 MG capsule, Take 1 capsule (500 mg total) by mouth 2 (two) times daily., Disp: 14 capsule, Rfl: 0   escitalopram (LEXAPRO) 10 MG tablet, Take 1 tablet (10 mg total) by mouth daily., Disp: 30 tablet, Rfl: 3   ondansetron (ZOFRAN) 4 MG tablet, Take 1 tablet (4 mg total) by mouth every 8 (eight) hours as needed for nausea or vomiting., Disp: 30 tablet, Rfl: 1   OVER THE COUNTER MEDICATION, Take 1 tablet by mouth daily. Digestive health one gummy daily., Disp: , Rfl:    pantoprazole (PROTONIX) 40 MG tablet, Take 1 tablet (40 mg total) by mouth daily., Disp: 90 tablet, Rfl: 1   promethazine-dextromethorphan (PROMETHAZINE-DM) 6.25-15 MG/5ML syrup, Take 5 mLs by mouth 4 (four) times daily as needed. (Patient not taking: Reported on 05/30/2023), Disp: 100 mL, Rfl: 0  No Known Allergies  Objective:   BP 122/72   Pulse 97   Temp 98.3 F (36.8 C) (Oral)   Ht 5\' 1"  (1.549 m)   Wt 160 lb (72.6 kg)   SpO2 97%   BMI 30.23 kg/m      05/30/2023    3:06 PM 05/04/2023    4:10 PM 04/22/2023    2:01 PM  Vitals with BMI  Height 5\' 1"   5\' 1"   Weight 160 lbs  159 lbs  BMI 30.25  30.06  Systolic 122 120 161  Diastolic 72 77 72  Pulse 97 93 96     Physical Exam Vitals and nursing note reviewed.  Constitutional:      Appearance: Normal appearance. She is normal weight.  HENT:     Head: Normocephalic and atraumatic.  Pulmonary:     Effort: Pulmonary effort is normal.  Abdominal:     General: Bowel sounds are normal. There is no distension.     Palpations: Abdomen is soft.     Tenderness: There is no right CVA tenderness or left CVA tenderness.  Skin:    General: Skin is warm and dry.  Neurological:     General: No focal deficit present.     Mental Status: She is alert and oriented  to person, place, and time. Mental status is at baseline.  Psychiatric:        Mood and Affect: Mood normal.        Behavior: Behavior normal.        Thought Content: Thought content normal.        Judgment: Judgment normal.     Assessment & Plan:  Acute cystitis without hematuria Assessment & Plan: UA with trace leukocytes, hematuria, and few bacteria. Will treat with Keflex BID for 7 days. If symptoms persist or worsen return to office. Will consider pelvic floor PT or referral back to GYN.   Urinary incontinence, unspecified type -     Urinalysis, Routine w reflex microscopic  Other orders -     Cephalexin; Take 1 capsule (500 mg total) by mouth 2 (two) times daily.  Dispense: 14 capsule; Refill: 0 -     Microscopic Message     Follow up plan: Return if symptoms worsen or fail to improve.  Park Meo, FNP

## 2023-05-31 ENCOUNTER — Other Ambulatory Visit: Payer: Medicare HMO

## 2023-05-31 DIAGNOSIS — E7800 Pure hypercholesterolemia, unspecified: Secondary | ICD-10-CM | POA: Diagnosis not present

## 2023-05-31 DIAGNOSIS — G4452 New daily persistent headache (NDPH): Secondary | ICD-10-CM | POA: Diagnosis not present

## 2023-05-31 DIAGNOSIS — R739 Hyperglycemia, unspecified: Secondary | ICD-10-CM | POA: Diagnosis not present

## 2023-05-31 DIAGNOSIS — M3500 Sicca syndrome, unspecified: Secondary | ICD-10-CM

## 2023-05-31 DIAGNOSIS — R270 Ataxia, unspecified: Secondary | ICD-10-CM | POA: Diagnosis not present

## 2023-06-01 LAB — COMPLETE METABOLIC PANEL WITH GFR
AG Ratio: 1.9 (calc) (ref 1.0–2.5)
ALT: 13 U/L (ref 6–29)
AST: 13 U/L (ref 10–35)
Albumin: 4.4 g/dL (ref 3.6–5.1)
Alkaline phosphatase (APISO): 81 U/L (ref 37–153)
BUN/Creatinine Ratio: 32 (calc) — ABNORMAL HIGH (ref 6–22)
BUN: 26 mg/dL — ABNORMAL HIGH (ref 7–25)
CO2: 29 mmol/L (ref 20–32)
Calcium: 9.7 mg/dL (ref 8.6–10.4)
Chloride: 103 mmol/L (ref 98–110)
Creat: 0.82 mg/dL (ref 0.50–1.05)
Globulin: 2.3 g/dL (ref 1.9–3.7)
Glucose, Bld: 93 mg/dL (ref 65–99)
Potassium: 4.2 mmol/L (ref 3.5–5.3)
Sodium: 139 mmol/L (ref 135–146)
Total Bilirubin: 0.8 mg/dL (ref 0.2–1.2)
Total Protein: 6.7 g/dL (ref 6.1–8.1)
eGFR: 77 mL/min/{1.73_m2} (ref >=60)

## 2023-06-01 LAB — CBC WITH DIFFERENTIAL/PLATELET
Absolute Lymphocytes: 1917 {cells}/uL (ref 850–3900)
Absolute Monocytes: 585 {cells}/uL (ref 200–950)
Basophils Absolute: 62 {cells}/uL (ref 0–200)
Basophils Relative: 0.8 %
Eosinophils Absolute: 62 {cells}/uL (ref 15–500)
Eosinophils Relative: 0.8 %
HCT: 42.3 % (ref 35.0–45.0)
Hemoglobin: 14.1 g/dL (ref 11.7–15.5)
MCH: 29.7 pg (ref 27.0–33.0)
MCHC: 33.3 g/dL (ref 32.0–36.0)
MCV: 89.2 fL (ref 80.0–100.0)
MPV: 11 fL (ref 7.5–12.5)
Monocytes Relative: 7.6 %
Neutro Abs: 5074 {cells}/uL (ref 1500–7800)
Neutrophils Relative %: 65.9 %
Platelets: 350 10*3/uL (ref 140–400)
RBC: 4.74 10*6/uL (ref 3.80–5.10)
RDW: 12.1 % (ref 11.0–15.0)
Total Lymphocyte: 24.9 %
WBC: 7.7 10*3/uL (ref 3.8–10.8)

## 2023-06-01 LAB — LIPID PANEL
Cholesterol: 202 mg/dL — ABNORMAL HIGH (ref <200)
HDL: 61 mg/dL (ref >=50)
LDL Cholesterol (Calc): 124 mg/dL — ABNORMAL HIGH
Non-HDL Cholesterol (Calc): 141 mg/dL — ABNORMAL HIGH (ref <130)
Total CHOL/HDL Ratio: 3.3 (calc) (ref <5.0)
Triglycerides: 80 mg/dL (ref <150)

## 2023-06-01 LAB — HEMOGLOBIN A1C
Hgb A1c MFr Bld: 5.7 %{Hb} — ABNORMAL HIGH (ref <5.7)
Mean Plasma Glucose: 117 mg/dL
eAG (mmol/L): 6.5 mmol/L

## 2023-06-17 ENCOUNTER — Telehealth: Payer: Self-pay | Admitting: Family Medicine

## 2023-06-17 ENCOUNTER — Other Ambulatory Visit: Payer: Self-pay

## 2023-06-17 DIAGNOSIS — E7800 Pure hypercholesterolemia, unspecified: Secondary | ICD-10-CM

## 2023-06-17 DIAGNOSIS — K224 Dyskinesia of esophagus: Secondary | ICD-10-CM

## 2023-06-17 DIAGNOSIS — K219 Gastro-esophageal reflux disease without esophagitis: Secondary | ICD-10-CM

## 2023-06-17 DIAGNOSIS — F411 Generalized anxiety disorder: Secondary | ICD-10-CM

## 2023-06-17 MED ORDER — ATORVASTATIN CALCIUM 40 MG PO TABS: 40.0000 mg | ORAL_TABLET | Freq: Every day | ORAL | 2 refills | Status: AC

## 2023-06-17 MED ORDER — PANTOPRAZOLE SODIUM 40 MG PO TBEC: 40.0000 mg | DELAYED_RELEASE_TABLET | Freq: Every day | ORAL | 2 refills | Status: DC

## 2023-06-17 MED ORDER — ESCITALOPRAM OXALATE 10 MG PO TABS: 10.0000 mg | ORAL_TABLET | Freq: Every day | ORAL | 2 refills | Status: DC

## 2023-06-17 NOTE — Telephone Encounter (Signed)
 Patient came to the office to request for her pharmacy to be permanently changed to [facility] on [address] in [place]. Patient also requesting for all script recently sent to [facility] to be transferred to [facility] (pantoprazole  and new script for cholesterol management- 10mg ; pt unsure of name of medication). Patient unsure if new scripts needed. Patient also requesting for provider to synchronize due dates for all meds so she only has to make one trip to the pharmacy.   Please advise at [phone].

## 2023-06-20 ENCOUNTER — Ambulatory Visit (INDEPENDENT_AMBULATORY_CARE_PROVIDER_SITE_OTHER): Payer: Medicare HMO | Admitting: Gastroenterology

## 2023-07-26 DIAGNOSIS — M7062 Trochanteric bursitis, left hip: Secondary | ICD-10-CM | POA: Diagnosis not present

## 2023-08-08 ENCOUNTER — Other Ambulatory Visit (INDEPENDENT_AMBULATORY_CARE_PROVIDER_SITE_OTHER): Payer: Medicare Other | Admitting: *Deleted

## 2023-08-08 DIAGNOSIS — R350 Frequency of micturition: Secondary | ICD-10-CM

## 2023-08-08 DIAGNOSIS — N3941 Urge incontinence: Secondary | ICD-10-CM

## 2023-08-08 DIAGNOSIS — R8290 Unspecified abnormal findings in urine: Secondary | ICD-10-CM | POA: Diagnosis not present

## 2023-08-08 LAB — POCT URINALYSIS DIPSTICK
Blood, UA: NEGATIVE
Glucose, UA: NEGATIVE
Ketones, UA: NEGATIVE
Leukocytes, UA: NEGATIVE
Nitrite, UA: NEGATIVE
Protein, UA: NEGATIVE

## 2023-08-08 NOTE — Progress Notes (Signed)
   NURSE VISIT- UTI SYMPTOMS   SUBJECTIVE:  Taylor Singleton is a 68 y.o. G47P2002 female here for UTI symptoms. She is a GYN patient. She reports  urinary incontinence, abnormal urine odor and urinary frequency X 1 month . Pt also reports itching around rectum.   OBJECTIVE:  There were no vitals taken for this visit.  Appears well, in no apparent distress  Results for orders placed or performed in visit on 08/08/23 (from the past 24 hours)  POCT Urinalysis Dipstick   Collection Time: 08/08/23  3:41 PM  Result Value Ref Range   Color, UA     Clarity, UA     Glucose, UA Negative Negative   Bilirubin, UA     Ketones, UA neg    Spec Grav, UA     Blood, UA neg    pH, UA     Protein, UA Negative Negative   Urobilinogen, UA     Nitrite, UA neg    Leukocytes, UA Negative Negative   Appearance     Odor      ASSESSMENT: GYN patient with UTI symptoms and negative nitrites  PLAN: Note routed to Cyril Mourning, AGNP   Rx sent by provider today: No Urine culture sent Call or return to clinic prn if these symptoms worsen or fail to improve as anticipated. Follow-up: as scheduled with Dr. Charlotta Newton on 08/17/23.   Malachy Mood  08/08/2023 3:47 PM

## 2023-08-09 LAB — URINALYSIS, ROUTINE W REFLEX MICROSCOPIC
Bilirubin, UA: NEGATIVE
Glucose, UA: NEGATIVE
Ketones, UA: NEGATIVE
Nitrite, UA: NEGATIVE
Protein,UA: NEGATIVE
RBC, UA: NEGATIVE
Specific Gravity, UA: 1.022 (ref 1.005–1.030)
Urobilinogen, Ur: 1 mg/dL (ref 0.2–1.0)
pH, UA: 6.5 (ref 5.0–7.5)

## 2023-08-09 LAB — MICROSCOPIC EXAMINATION: Casts: NONE SEEN /[LPF]

## 2023-08-10 LAB — URINE CULTURE

## 2023-08-17 ENCOUNTER — Ambulatory Visit: Payer: Medicare Other | Admitting: Obstetrics & Gynecology

## 2023-08-22 ENCOUNTER — Encounter: Payer: Self-pay | Admitting: Physician Assistant

## 2023-08-22 ENCOUNTER — Other Ambulatory Visit (INDEPENDENT_AMBULATORY_CARE_PROVIDER_SITE_OTHER): Payer: Self-pay

## 2023-08-22 ENCOUNTER — Ambulatory Visit: Admitting: Physician Assistant

## 2023-08-22 DIAGNOSIS — M25552 Pain in left hip: Secondary | ICD-10-CM
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
## 2023-08-26 ENCOUNTER — Ambulatory Visit: Admitting: Family Medicine

## 2023-08-26 ENCOUNTER — Encounter: Payer: Self-pay | Admitting: Family Medicine

## 2023-08-26 VITALS — BP 126/84 | HR 93 | Temp 98.3°F | Ht 61.0 in | Wt 154.4 lb

## 2023-08-26 DIAGNOSIS — B372 Candidiasis of skin and nail: Secondary | ICD-10-CM | POA: Diagnosis not present

## 2023-08-26 DIAGNOSIS — L821 Other seborrheic keratosis: Secondary | ICD-10-CM

## 2023-08-26 MED ORDER — NYSTATIN-TRIAMCINOLONE 100000-0.1 UNIT/GM-% EX OINT
1.0000 | TOPICAL_OINTMENT | Freq: Two times a day (BID) | CUTANEOUS | 1 refills | Status: DC
Start: 2023-08-26 — End: 2023-12-15

## 2023-08-26 NOTE — Progress Notes (Signed)
 Patient Office Visit  Assessment & Plan:  Candidiasis, intertrigo -     Nystatin-Triamcinolone; Apply 1 Application topically 2 (two) times daily. Apply to affected areas BID  Dispense: 60 g; Refill: 1  Seborrheic keratoses   Patient will have her husband moisturize her back and use over-the-counter hydrocortisone to the irritated scaly areas. Combo ointment will be used under both breasts.  Return if symptoms worsen or fail to improve.   Subjective:    Patient ID: Taylor Singleton, female    DOB: 12-28-1955  Age: 68 y.o. MRN: [id number]  Chief Complaint  Patient presents with   Rash    Underneath breast where bra sits.    Pruritis    Itchy moles located on back.    HPI Intertrigone/Rash between and under breasts for one month. Pt has tried OTC moisturizer which helps with dry skin but that's it.  Does sweat in the warmer months and does use underwire bra.  Patient does take her bra off in the evening at home.  Patient has not used any creams or powders  under the breasts Seborrheic keratosis-patient has numerous seborrheic keratosis of her back.  Patient has had some of these frozen in the past per Dr. Tanya Nones.  Patient has a couple areas that are scaly and red and very itchy.  Patient has not used anything for this.  The 10-year ASCVD risk score (Arnett DK, et al., 2019) is: 9.1%  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    Varicose vein of leg    Wears glasses    Past Surgical History:  Procedure Laterality Date   ANTERIOR AND POSTERIOR REPAIR WITH SACROSPINOUS FIXATION N/A 07/19/2022   Procedure: POSTERIOR REPAIR WITHP PERINEORRHAPHY AND  SACROSPINOUS FIXATION;  Surgeon: Marguerita Beards, MD;  Location: [facility];  Service: Gynecology;  Laterality: N/A;  Total time requested is 1.5hrs   BREAST CYST EXCISION Left    CHOLECYSTECTOMY     CHOLECYSTECTOMY, LAPAROSCOPIC  2010   COLONOSCOPY WITH PROPOFOL N/A 03/22/2023   Procedure: COLONOSCOPY WITH PROPOFOL;  Surgeon: Dolores Frame, MD;  [facility];  Service: Gastroenterology;  Laterality: N/A;  8:45AM;ASA 1   TOTAL ABDOMINAL HYSTERECTOMY W/ BILATERAL SALPINGOOPHORECTOMY     1990s   Social History   Tobacco Use   Smoking status: Never   Smokeless tobacco: Never  Vaping Use   Vaping status: Never Used  Substance Use Topics   Alcohol use: No   Drug use: Never   Family History  Problem Relation Age of Onset   Diabetes Mother    Heart attack Brother    No Known Allergies  ROS    Objective:    BP 126/84   Pulse 93   Temp 98.3 F (36.8 C)   Ht 5\' 1"  (1.549 m)   Wt 154 lb 6 oz (70 kg)   SpO2 99%   BMI 29.17 kg/m  BP Readings from Last 3 Encounters:  08/26/23 126/84  05/30/23 122/72  05/04/23 120/77   Wt Readings from Last 3 Encounters:  08/26/23 154 lb 6 oz (70 kg)  05/30/23 160 lb (72.6 kg)  04/22/23 159 lb (72.1 kg)    Physical Exam Vitals  and nursing note reviewed.  Constitutional:      Appearance: Normal appearance.  HENT:     Head: Normocephalic.     Right Ear: Tympanic membrane and ear canal normal.     Left Ear: Tympanic membrane and ear canal normal.  Eyes:     Pupils: Pupils are equal, round, and reactive to light.  Cardiovascular:     Rate and Rhythm: Normal rate and regular rhythm.     Heart sounds: Normal heart sounds.  Pulmonary:     Effort: Pulmonary effort is normal.     Breath sounds: Normal breath sounds.  Skin:    Findings: Lesion and rash present.     Comments: Pt does wear under wire bra- Erythematous rash between both breasts and also underneath both breasts.  Patient also has numerous pigmented seborrheic keratoses over the back area, couple areas look erythematous  and scaly.  Neurological:     General: No focal deficit present.     Mental Status: She is alert and oriented to person, place, and time.  Psychiatric:        Mood and Affect: Mood normal.        Behavior: Behavior normal.      No results found for any visits on 08/26/23.

## 2023-08-30 ENCOUNTER — Ambulatory Visit
Admission: RE | Admit: 2023-08-30 | Discharge: 2023-08-30 | Disposition: A | Discharge status: Home / Self Care | Source: Ambulatory Visit | Attending: Physician Assistant | Admitting: Physician Assistant

## 2023-08-30 DIAGNOSIS — M25552 Pain in left hip: Secondary | ICD-10-CM | POA: Diagnosis not present

## 2023-09-05 ENCOUNTER — Encounter: Payer: Self-pay | Admitting: Obstetrics & Gynecology

## 2023-09-05 ENCOUNTER — Ambulatory Visit: Admitting: Obstetrics & Gynecology

## 2023-09-05 VITALS — BP 133/79 | HR 84 | Ht 61.0 in | Wt 159.4 lb

## 2023-09-05 DIAGNOSIS — N3281 Overactive bladder: Secondary | ICD-10-CM | POA: Diagnosis not present

## 2023-09-05 DIAGNOSIS — L309 Dermatitis, unspecified: Secondary | ICD-10-CM | POA: Diagnosis not present

## 2023-09-05 DIAGNOSIS — Z90710 Acquired absence of both cervix and uterus: Secondary | ICD-10-CM

## 2023-09-05 MED ORDER — MIRABEGRON ER 25 MG PO TB24: 25.0000 mg | ORAL_TABLET | Freq: Every day | ORAL | 6 refills | Status: DC

## 2023-09-05 NOTE — Progress Notes (Signed)
 GYN VISIT Patient name: Taylor Singleton MRN [id number]  Date of birth: Jan 16, 1956 Chief Complaint:   Urinary Incontinence  History of Present Illness:   Taylor Singleton is a 68 y.o. G2P2002 PM, PH female being seen today for the following concerns:  In review- seen in April 2024- diagnosed with BV- treated.  Also sent in Estrace which she did not end up using. No longer having burning and vaginal irritation.  Urinary concerns:  Notes urge incontinence with occasional leak of small amount.  Notes frequent voiding to ensure she avoids an accident.  On occasion will have stress incontinence.  +Urinary frequency.  She also notes a rash under her breast- seen by PCP.  Given Mycolog which she is using at night and does not seem to help .     No LMP recorded. Patient has had a hysterectomy.    Review of Systems:   Pertinent items are noted in HPI Denies fever/chills, dizziness, headaches, visual disturbances, fatigue, shortness of breath, chest pain, abdominal pain, vomiting Pertinent History Reviewed:   Past Surgical History:  Procedure Laterality Date   ANTERIOR AND POSTERIOR REPAIR WITH SACROSPINOUS FIXATION N/A 07/19/2022   Procedure: POSTERIOR REPAIR WITHP PERINEORRHAPHY AND  SACROSPINOUS FIXATION;  Surgeon: Marguerita Beards, MD;  Location: [facility];  Service: Gynecology;  Laterality: N/A;  Total time requested is 1.5hrs   BREAST CYST EXCISION Left    CHOLECYSTECTOMY     CHOLECYSTECTOMY, LAPAROSCOPIC  2010   COLONOSCOPY WITH PROPOFOL N/A 03/22/2023   Procedure: COLONOSCOPY WITH PROPOFOL;  Surgeon: Dolores Frame, MD;  [facility];  Service: Gastroenterology;  Laterality: N/A;  8:45AM;ASA 1   TOTAL ABDOMINAL HYSTERECTOMY W/ BILATERAL SALPINGOOPHORECTOMY     1990s    Past Medical History:  Diagnosis Date   Arthritis    Chronic idiopathic constipation    GERD (gastroesophageal reflux disease)    HLD (hyperlipidemia)    Hyperlipidemia     Osteoporosis    Prolapse of posterior vaginal wall    Sciatica    Vaginal vault 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    Varicose vein of leg    Wears glasses    Reviewed problem list, medications and allergies. Physical Assessment:   Vitals:   09/05/23 1448  BP: 133/79  Pulse: 84  Weight: 159 lb 6.4 oz (72.3 kg)  Height: 5\' 1"  (1.549 m)  Body mass index is 30.12 kg/m.       Physical Examination:   General appearance: alert, well appearing, and in no distress  Psych: mood appropriate, normal affect  Skin: warm & dry.  Under breast and extending to mid-portion of abdomen multiple circular raised papules.  Non-tedner to palpation.  Minimal erythema  Cardiovascular: normal heart rate noted  Respiratory: normal respiratory effort, no distress  Abdomen: soft, non-tender   Pelvic: VULVA: normal appearing vulva with no masses, tenderness or lesions, VAGINA: normal appearing vagina with normal color and discharge, no lesions.  Atrophic appearing tissue.  Stage 1 prolapse noted.    Extremities: no edema   Chaperone: Faith Rogue    Assessment & Plan:  1) OAB -plan for trial of myrbetriq -f/u in 3 mos -will hold off on estrace at this time  2) Dermatitis -etiology unclear- do not think it's intertrigo, almost looks similar to molluscum -recommendation for dermatology for further work up treatment  Meds ordered this encounter  Medications   mirabegron ER (MYRBETRIQ) 25 MG TB24 tablet    Sig: Take 1 tablet (25 mg total)  by mouth daily.    Dispense:  30 tablet    Refill:  6      No orders of the defined types were placed in this encounter.   Return in about 3 months (around 12/06/2023) for Medication follow up.   Myna Hidalgo, DO Attending Obstetrician & Gynecologist, [facility] for [employer], [facility]

## 2023-09-13 ENCOUNTER — Other Ambulatory Visit: Payer: Self-pay | Admitting: Obstetrics & Gynecology

## 2023-09-13 ENCOUNTER — Telehealth: Payer: Self-pay | Admitting: *Deleted

## 2023-09-13 DIAGNOSIS — N3281 Overactive bladder: Secondary | ICD-10-CM

## 2023-09-13 MED ORDER — OXYBUTYNIN CHLORIDE ER 5 MG PO TB24
5.0000 mg | ORAL_TABLET | Freq: Every day | ORAL | 11 refills | Status: AC
Start: 2023-09-13 — End: ?

## 2023-09-13 NOTE — Progress Notes (Signed)
 Rx for ditropan due to cost of myrbetriq  Myna Hidalgo, DO Attending Obstetrician & Gynecologist, [facility] [employer], [facility]

## 2023-09-13 NOTE — Telephone Encounter (Signed)
 Pt notified Myrbetriq not covered by insurance. Alternative Oxybutin sent in. Advised to let us know if she experiences any side effects (dry eyes, dry mouth, constipation, etc)  verbalized understanding with no further questions.

## 2023-09-15 ENCOUNTER — Ambulatory Visit: Admitting: Orthopaedic Surgery

## 2023-09-15 ENCOUNTER — Encounter: Payer: Self-pay | Admitting: Orthopaedic Surgery

## 2023-09-15 DIAGNOSIS — L2989 Other pruritus: Secondary | ICD-10-CM | POA: Diagnosis not present

## 2023-09-15 DIAGNOSIS — D485 Neoplasm of uncertain behavior of skin: Secondary | ICD-10-CM | POA: Diagnosis not present

## 2023-09-15 DIAGNOSIS — Z789 Other specified health status: Secondary | ICD-10-CM | POA: Diagnosis not present

## 2023-09-15 DIAGNOSIS — M25552 Pain in left hip: Secondary | ICD-10-CM | POA: Diagnosis not present

## 2023-09-15 DIAGNOSIS — C44519 Basal cell carcinoma of skin of other part of trunk: Secondary | ICD-10-CM | POA: Diagnosis not present

## 2023-09-15 DIAGNOSIS — R208 Other disturbances of skin sensation: Secondary | ICD-10-CM | POA: Diagnosis not present

## 2023-09-15 DIAGNOSIS — L820 Inflamed seborrheic keratosis: Secondary | ICD-10-CM | POA: Diagnosis not present

## 2023-09-15 NOTE — Progress Notes (Signed)
 The patient is a very pleasant 68 year old female who comes in today to go over MRI of her left hip.  She was sent to [place] originally from her neurosurgeon Dr. Marikay Alar.  She has had spine surgery.  She denies still any groin pain but is very active and has a lot of pain over her trochanteric area of her left hip and just posterior to that area.  She is someone who still works and does a lot of activities with the groceries.  She has recently been going to [employer] at [employer] so she is very familiar with that area.  She has a hard time sleeping at night on that side as well as wanting to lean more on her right side when she stands to take pressure of her left hip.  On my exam today her left hip moves smoothly with and fluidly with no blocks to rotation and no pain in the groin at all.  However she does have weakness in her hip abductors and pain all along the insertions of the gluteus medius and minimus areas of the trochanteric area.  The MRI is reviewed of her left hip and it does correlate and correspond with the pain that she is having.  There is some mild arthritic changes in her left hip joint and a small labral tear but this is degenerative in nature and she is asymptomatic from the arthritic aspect of things.  However she does have a complete tear of the gluteus minimus and medius tendons at the trochanteric area of her left hip.  I talked to her about what this means.  I would like to send her to my partner Dr. Steward Drone for his assessment and critical evaluation of this area of her left hip.  This may likely require surgical intervention but we will leave that up to his expertise and she understands that as well.  She is happy to go there to see him.

## 2023-09-19 ENCOUNTER — Other Ambulatory Visit: Payer: Self-pay | Admitting: Family Medicine

## 2023-09-19 DIAGNOSIS — Z1231 Encounter for screening mammogram for malignant neoplasm of breast: Secondary | ICD-10-CM

## 2023-10-04 ENCOUNTER — Ambulatory Visit
Admission: RE | Admit: 2023-10-04 | Discharge: 2023-10-04 | Disposition: A | Discharge status: Home / Self Care | Source: Ambulatory Visit | Attending: Family Medicine | Admitting: Family Medicine

## 2023-10-04 DIAGNOSIS — Z1231 Encounter for screening mammogram for malignant neoplasm of breast: Secondary | ICD-10-CM

## 2023-10-07 ENCOUNTER — Ambulatory Visit (HOSPITAL_BASED_OUTPATIENT_CLINIC_OR_DEPARTMENT_OTHER): Admitting: Orthopaedic Surgery

## 2023-10-07 DIAGNOSIS — M25552 Pain in left hip: Secondary | ICD-10-CM

## 2023-10-07 MED ORDER — TRIAMCINOLONE ACETONIDE 40 MG/ML IJ SUSP
80.0000 mg | INTRAMUSCULAR | Status: AC | PRN
Start: 2023-10-07 — End: 2023-10-07
  Administered 2023-10-07: 80 mg via INTRA_ARTICULAR

## 2023-10-07 MED ORDER — LIDOCAINE HCL 1 % IJ SOLN
4.0000 mL | INTRAMUSCULAR | Status: AC | PRN
Start: 2023-10-07 — End: 2023-10-07
  Administered 2023-10-07: 4 mL

## 2023-10-07 NOTE — Progress Notes (Signed)
 Chief Complaint: Hip pain     History of Present Illness:    Taylor Singleton is a 68 y.o. female today with ongoing left hip pain in the setting of chronic symptoms about the posterior buttocks radiating to the side.  She is status post multiple epidural injections and ultimately lumbar discectomy with Dr. Rochelle Chu.  She is still having persistent pain about the lateral aspect of the hip.  She does have weakness with shortening of her stride and difficulty laying directly on the side as well as getting in and out of cars.  She does enjoy staying active but this has been limited as a result of her hip pain.  She has been seeing Dr. Lucienne Ryder who is recommended an MRI and further follow-up for discussion    PMH/PSH/Family History/Social History/Meds/Allergies:    Past Medical History:  Diagnosis Date  . Arthritis   . Chronic idiopathic constipation   . GERD (gastroesophageal reflux disease)   . HLD (hyperlipidemia)   . Hyperlipidemia   . Osteoporosis   . Prolapse of posterior vaginal wall   . Sciatica   . Vaginal vault 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   . Varicose vein of leg   . Wears glasses    Past Surgical History:  Procedure Laterality Date  . ANTERIOR AND POSTERIOR REPAIR WITH SACROSPINOUS FIXATION N/A 07/19/2022   Procedure: POSTERIOR REPAIR WITHP PERINEORRHAPHY AND  SACROSPINOUS FIXATION;  Surgeon: Arma Lamp, MD;  Location: [facility];  Service: Gynecology;  Laterality: N/A;  Total time requested is 1.5hrs  . BREAST CYST EXCISION Left   . CHOLECYSTECTOMY    . CHOLECYSTECTOMY, LAPAROSCOPIC  2010  . COLONOSCOPY WITH PROPOFOL  N/A 03/22/2023   Procedure: COLONOSCOPY WITH PROPOFOL ;  Surgeon: Urban Garden, MD;  [facility];  Service: Gastroenterology;  Laterality: N/A;  8:45AM;ASA 1  . TOTAL ABDOMINAL HYSTERECTOMY W/ BILATERAL SALPINGOOPHORECTOMY     1990s   Social History   Socioeconomic History  . Marital status: Married     Spouse name: Not on file  . Number of children: 2  . Years of education: Not on file  . Highest education level: Not on file  Occupational History  . Not on file  Tobacco Use  . Smoking status: Never  . Smokeless tobacco: Never  Vaping Use  . Vaping status: Never Used  Substance and Sexual Activity  . Alcohol use: No  . Drug use: Never  . Sexual activity: Not Currently    Birth control/protection: Surgical    Comment: hyst  Other Topics Concern  . Not on file  Social History Narrative  . Not on file   Social Drivers of Health   Financial Resource Strain: Low Risk  (05/25/2022)   Overall Financial Resource Strain (CARDIA)   . Difficulty of Paying Living Expenses: Not hard at all  Food Insecurity: No Food Insecurity (05/25/2022)   Hunger Vital Sign   . Worried About Programme researcher, broadcasting/film/video in the Last Year: Never true   . Ran Out of Food in the Last Year: Never true  Transportation Needs: No Transportation Needs (05/25/2022)   PRAPARE - Transportation   . Lack of Transportation (Medical): No   . Lack of Transportation (Non-Medical): No  Physical Activity: Insufficiently Active (05/25/2022)   Exercise Vital Sign   . Days of Exercise per Week: 3 days   . Minutes of Exercise per Session: 30 min  Stress: No Stress Concern Present (05/25/2022)   [employer] of Occupational  Health - Occupational Stress Questionnaire   . Feeling of Stress : Not at all  Social Connections: Moderately Isolated (05/25/2022)   Social Connection and Isolation Panel [NHANES]   . Frequency of Communication with Friends and Family: Twice a week   . Frequency of Social Gatherings with Friends and Family: Three times a week   . Attends Religious Services: Never   . Active Member of Clubs or Organizations: No   . Attends Banker Meetings: Never   . Marital Status: Married   Family History  Problem Relation Age of Onset  . Diabetes Mother   . Heart attack Brother   . Breast cancer Neg  Hx    No Known Allergies Current Outpatient Medications  Medication Sig Dispense Refill  . atorvastatin  (LIPITOR) 40 MG tablet Take 1 tablet (40 mg total) by mouth daily. 90 tablet 2  . Calcium  Carb-Cholecalciferol (CALCIUM /VITAMIN D) 600-400 MG-UNIT TABS Take 2 tablets by mouth daily.    . nystatin -triamcinolone  ointment (MYCOLOG) Apply 1 Application topically 2 (two) times daily. Apply to affected areas BID 60 g 1  . oxybutynin  (DITROPAN  XL) 5 MG 24 hr tablet Take 1 tablet (5 mg total) by mouth at bedtime. 30 tablet 11  . pantoprazole  (PROTONIX ) 40 MG tablet Take 1 tablet (40 mg total) by mouth daily. 90 tablet 2   No current facility-administered medications for this visit.   No results found.  Review of Systems:   A ROS was performed including pertinent positives and negatives as documented in the HPI.  Physical Exam :   Constitutional: NAD and appears stated age Neurological: Alert and oriented Psych: Appropriate affect and cooperative There were no vitals taken for this visit.   Comprehensive Musculoskeletal Exam:    Left hip with tenderness about the gluteus as well as trochanteric insertion.  There is weakness with resisted abduction and Trendelenburg gait.  30 degrees internal rotation of the left hip without pain and negative FADIR or FABER   Imaging:   Xray (left hip): Normal osteoarthritis left hip  MRI (left hip): Insertional tearing of the gluteus medius insertion not significant atrophy   I personally reviewed and interpreted the radiographs.   Assessment and Plan:   68 y.o. female with left gluteus medius insertional tendinopathy in the setting of chronic left hip pain.  Today's visit I did recommend an ultrasound-guided injection of the left hip so that we can diagnostically assess if this is the true underlying etiology of her pain.  Will plan to proceed with this and I will see her back in 1 month to assess results  -Hip ultrasound-guided injection  provided after verbal consent obtained    Procedure Note  Patient: Taylor Singleton             Date of Birth: Jul 18, 1955           MRN: [id number]             Visit Date: 10/07/2023  Procedures: Visit Diagnoses: No diagnosis found.  Large Joint Inj: L greater trochanter on 10/07/2023 9:57 AM Indications: pain Details: 22 G 3.5 in needle, ultrasound-guided anterolateral approach  Arthrogram: No  Medications: 4 mL lidocaine  1 %; 80 mg triamcinolone  acetonide 40 MG/ML Outcome: tolerated well, no immediate complications Procedure, treatment alternatives, risks and benefits explained, specific risks discussed. Consent was given by the patient. Immediately prior to procedure a time out was called to verify the correct patient, procedure, equipment, support staff and site/side marked as required. Patient was  prepped and draped in the usual sterile fashion.        I personally saw and evaluated the patient, and participated in the management and treatment plan.  Wilhelmenia Harada, MD Attending Physician, Orthopedic Surgery  This document was dictated using Dragon voice recognition software. A reasonable attempt at proof reading has been made to minimize errors.

## 2023-10-10 DIAGNOSIS — C44519 Basal cell carcinoma of skin of other part of trunk: Secondary | ICD-10-CM | POA: Diagnosis not present

## 2023-10-10 DIAGNOSIS — L111 Transient acantholytic dermatosis [Grover]: Secondary | ICD-10-CM | POA: Diagnosis not present

## 2023-10-25 ENCOUNTER — Telehealth (HOSPITAL_BASED_OUTPATIENT_CLINIC_OR_DEPARTMENT_OTHER): Payer: Self-pay | Admitting: Orthopaedic Surgery

## 2023-10-25 DIAGNOSIS — M5416 Radiculopathy, lumbar region: Secondary | ICD-10-CM

## 2023-10-25 DIAGNOSIS — M25552 Pain in left hip: Secondary | ICD-10-CM

## 2023-10-25 NOTE — Telephone Encounter (Signed)
 Patient needs a referral to PT at [facility]

## 2023-11-04 ENCOUNTER — Ambulatory Visit (HOSPITAL_BASED_OUTPATIENT_CLINIC_OR_DEPARTMENT_OTHER): Attending: Orthopaedic Surgery | Admitting: Physical Therapy

## 2023-11-04 ENCOUNTER — Encounter (HOSPITAL_BASED_OUTPATIENT_CLINIC_OR_DEPARTMENT_OTHER): Payer: Self-pay | Admitting: Physical Therapy

## 2023-11-04 ENCOUNTER — Ambulatory Visit (HOSPITAL_BASED_OUTPATIENT_CLINIC_OR_DEPARTMENT_OTHER): Admitting: Orthopaedic Surgery

## 2023-11-04 ENCOUNTER — Other Ambulatory Visit: Payer: Self-pay

## 2023-11-04 DIAGNOSIS — M25552 Pain in left hip: Secondary | ICD-10-CM

## 2023-11-04 DIAGNOSIS — R2689 Other abnormalities of gait and mobility: Secondary | ICD-10-CM | POA: Diagnosis not present

## 2023-11-04 DIAGNOSIS — M5416 Radiculopathy, lumbar region: Secondary | ICD-10-CM | POA: Diagnosis not present

## 2023-11-04 DIAGNOSIS — M25652 Stiffness of left hip, not elsewhere classified: Secondary | ICD-10-CM | POA: Diagnosis not present

## 2023-11-04 NOTE — Therapy (Signed)
 [facility] OUT[facility] PHYS[facility]ERAPY LOWER EXTREMITY EVALUATION   Patient Name: Taylor Singleton MRN: [id number] DOB:March 03, 1956, 68 y.o., female Today's Date: 11/04/2023  END OF SESSION:  PT End of Session - 11/04/23 1558     Visit Number 1    Number of Visits 12    Date for PT Re-Evaluation 12/16/23    PT Start Time 1015    PT Stop Time 1058    PT Time Calculation (min) 43 min    Activity Tolerance Patient tolerated treatment well    Behavior During Therapy WFL for tasks assessed/performed             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    Varicose vein of leg    Wears glasses    Past Surgical History:  Procedure Laterality Date   ANTERIOR AND POSTERIOR REPAIR WITH SACROSPINOUS FIXATION N/A 07/19/2022   Procedure: POSTERIOR REPAIR WITHP PERINEORRHAPHY AND  SACROSPINOUS FIXATION;  Surgeon: Arma Lamp, MD;  Location: [facility];  Service: Gynecology;  Laterality: N/A;  Total time requested is 1.5hrs   BREAST CYST EXCISION Left    CHOLECYSTECTOMY     CHOLECYSTECTOMY, LAPAROSCOPIC  2010   COLONOSCOPY WITH PROPOFOL  N/A 03/22/2023   Procedure: COLONOSCOPY WITH PROPOFOL ;  Surgeon: Urban Garden, MD;  [facility];  Service: Gastroenterology;  Laterality: N/A;  8:45AM;ASA 1   TOTAL ABDOMINAL HYSTERECTOMY W/ BILATERAL SALPINGOOPHORECTOMY     1990s   Patient Active Problem List   Diagnosis Date Noted   Candidiasis, intertrigo 08/26/2023   Seborrheic keratoses 08/26/2023   Greater trochanteric pain syndrome 05/30/2023   Acute cystitis without hematuria 05/30/2023   Encounter for colorectal cancer screening 03/22/2023   Nausea without vomiting 03/17/2023   Flatulence 03/17/2023   S/P  hysterectomy 10/06/2022   Urinary frequency 10/06/2022   Vaginal burning 10/06/2022   Vaginal irritation 10/06/2022   Vulvar irritation 10/06/2022   Sebaceous cyst of labia 10/06/2022   BPPV (benign paroxysmal positional vertigo), left 01/29/2022   Laryngopharyngeal reflux (LPR) 01/29/2022   Pharyngoesophageal dysphagia 01/29/2022   HLD (hyperlipidemia)     PCP:  Dr Eliane Grooms MD  REFERRING PROVIDER: Dr Wilhelmenia Harada   REFERRING DIAG:  Diagnosis  M54.16 (ICD-10-CM) - Lumbar radiculopathy   THERAPY DIAG:  Pain in left hip  Stiffness of left hip, not elsewhere classified  Other abnormalities of gait and mobility  Rationale for Evaluation and Treatment: Rehabilitation  ONSET DATE:  Has had hip pain for several years   SUBJECTIVE:   SUBJECTIVE STATEMENT: The patient has a long history of left hip pain. She also had lumbar spine pain. She had a lumbar diskectomy earlier this year. She has recovered well from this but continued to have lateral  hip pain. She was found to have a gluteal tear. She comes in to therapy today for a pre-hab program prior to likely lateral hip fixation.  She has been exercising but is unsure what she can do with the hip. She is a member of the gym.   PERTINENT HISTORY: Multi joint OA, Osteoporosis, vaginal prolapse, sciatica,  PAIN:  Are you having pain? Yes: NPRS scale: 3-4/10 when she is eeling it  Pain location: left hip  Pain description: achng  Aggravating factors: standing and walking  Relieving factors: rest  PRECAUTIONS: None  RED FLAGS: None   WEIGHT BEARING RESTRICTIONS: No  FALLS:  Has patient fallen in last 6 months? Yes. Number of falls a month ago. Caught foot on her purse  LIVING ENVIRONMENT: Has 5 steps up the stairs  OCCUPATION:  Works two days a week at [employer].   Recreation:  Shop/ go out to lunch    PLOF: Independent  PATIENT GOALS:    NEXT MD VISIT: Nothing scheduled   OBJECTIVE:  Note:  Objective measures were completed at Evaluation unless otherwise noted.  DIAGNOSTIC FINDINGS:   MRI: IMPRESSION: 1. Complete tear of the left gluteus minimus tendon insertion. Full-thickness tear of the anterior aspect of the left gluteus medius tendon insertion. 2. Left anterior superior labral tear. 3. Mild osteoarthritis of bilateral hips. PATIENT SURVEYS:  LEFS  Extreme difficulty/unable (0), Quite a bit of difficulty (1), Moderate difficulty (2), Little difficulty (3), No difficulty (4) Survey date:    Any of your usual work, housework or school activities   2. Usual hobbies, recreational or sporting activities   3. Getting into/out of the bath   4. Walking between rooms   5. Putting on socks/shoes   6. Squatting    7. Lifting an object, like a bag of groceries from the floor   8. Performing light activities around your home   9. Performing heavy activities around your home   10. Getting into/out of a car   11. Walking 2 blocks   12. Walking 1 mile   13. Going up/down 10 stairs (1 flight)   14. Standing for 1 hour   15.  sitting for 1 hour   16. Running on even ground   17. Running on uneven ground   18. Making sharp turns while running fast   19. Hopping    20. Rolling over in bed   Score total:  29/80     COGNITION: Overall cognitive status: Within functional limits for tasks assessed     SENSATION: WFL  EDEMA:  None  MUSCLE LENGTH:  POSTURE: No Significant postural limitations  PALPATION:   LOWER EXTREMITY ROM:  Passive ROM Right eval Left eval  Hip flexion  Pain past 90 degrees   Hip extension    Hip abduction    Hip adduction    Hip internal rotation    Hip external rotation  Pain at end range   Knee flexion    Knee extension    Ankle dorsiflexion    Ankle plantarflexion    Ankle inversion    Ankle eversion     (Blank rows = not tested)  LOWER EXTREMITY MMT:  MMT Right eval Left eval  Hip flexion 23 20.9  Hip extension    Hip  abduction 24.5 15.5  Hip adduction    Hip internal rotation    Hip external rotation    Knee flexion    Knee extension 27.5 27.9  Ankle dorsiflexion    Ankle plantarflexion    Ankle inversion    Ankle eversion     (Blank rows = not tested)   GAIT: Mild decrease in left weight bearing  TREATMENT DATE:  Manual: Review of anterior/lateral  hip self soft tissue mobilization    Neuro-re-ed:  Supine march 2x10 with education on progression  Bridge 2x10  Laq yellow 2x10  Hip abduction with cuing for posture 2x10    PATIENT EDUCATION:  Education details: HEP, symptom management  Person educated: Patient Education method: Explanation, Demonstration, Tactile cues, Verbal cues, and Handouts Education comprehension: verbalized understanding, returned demonstration, verbal cues required, tactile cues required, and needs further education  HOME EXERCISE PROGRAM: Access Code: [email or web address] URL: [email or web address] Date: 11/07/2023 Prepared by: Signa Drier  Exercises - Supine Bridge  - 1 x daily - 7 x weekly - 3 sets - 10 reps - Supine March  - 1 x daily - 7 x weekly - 3 sets - 10 reps - Seated Knee Extension with Resistance  - 1 x daily - 7 x weekly - 3 sets - 10 reps - Seated Hip Abduction with Resistance  - 1 x daily - 7 x weekly - 3 sets - 10 reps  ASSESSMENT:  CLINICAL IMPRESSION: Patient is a 68 year old female presents to physical therapy left hip pain.  MRI shows complete tear of the glute min and tendon and a full-thickness tear of the anterior portion of the gluteus medius tendon.  She presents with tenderness to palpation, left hip abductor weakness, and increased pain with general functional mobility.  She would benefit from skilled therapy to improve strength and general functional mobility.  She is likely going to have surgery in the  future.  Will build her an exercise program to strengthen prior to her procedure  OBJECTIVE IMPAIRMENTS: Abnormal gait, decreased activity tolerance, decreased mobility, difficulty walking, decreased ROM, decreased strength, and pain.   ACTIVITY LIMITATIONS: carrying, lifting, bending, sitting, standing, squatting, sleeping, stairs, transfers, and locomotion level  PARTICIPATION LIMITATIONS: meal prep, cleaning, laundry, driving, shopping, community activity, occupation, and yard work  PERSONAL FACTORS: 1-2 comorbidities: multi joint OA, back pain, osteoporosis  are also affecting patient's functional outcome.   REHAB POTENTIAL: Good  CLINICAL DECISION MAKING: Evolving/moderate complexity progressive increase in pain   EVALUATION COMPLEXITY: Low   GOALS: Goals reviewed with patient? Yes  SHORT TERM GOALS: Target date: 12/02/2023   Patient will increase hip abduction strength on the left by 5 pounds Baseline: Goal status: INITIAL  2.  Patient will report a 50% reduction in pain when she lies on her left side Baseline:  Goal status: INITIAL  3.  Patient will be independent with basic HEP Baseline:  Goal status: INITIAL   LONG TERM GOALS: Target date: 12/30/2023      Patient will have complete home exercise program prior to  surgical intervention Baseline:  Goal status: INITIAL  PLAN:  PT FREQUENCY: 1-2x/week  PT DURATION: 8 weeks  PLANNED INTERVENTIONS: 97110-Therapeutic exercises, 97530- Therapeutic activity, [id number]- Neuromuscular re-education, 97535- Self Care, 54098- Manual therapy, [id number]- Gait training, [id number]- Aquatic Therapy, [id number]- Electrical stimulation (unattended), 97035- Ultrasound, Patient/Family education, Stair training, Taping, Dry Needling, DME instructions, Cryotherapy, and Moist heat   PLAN FOR NEXT SESSION:  Review HEP.  If patient tolerates well consider light gym exercises.  Keep exercises late at this time.  Consider manual therapy if needed.   Consider standing hip 3-way series.   Kitty Perkins, PT 11/04/2023, 4:00 PM

## 2023-11-07 ENCOUNTER — Other Ambulatory Visit: Payer: Self-pay

## 2023-11-07 ENCOUNTER — Encounter (HOSPITAL_BASED_OUTPATIENT_CLINIC_OR_DEPARTMENT_OTHER): Payer: Self-pay | Admitting: Physical Therapy

## 2023-11-14 ENCOUNTER — Ambulatory Visit: Payer: Self-pay | Admitting: Family Medicine

## 2023-11-14 NOTE — Telephone Encounter (Signed)
 Chief Complaint: Leg and ankle swelling intermittent for years, worsening  Symptoms: Left leg is worse  Pertinent Negatives: Patient denies limping, swelling above knee, pain (has previously) difficulty breathing, chest pain  Disposition: [x] Appointment (In office)  Additional Notes: Patient scheduled for an appointment tomorrow in office. This RN educated pt on new-worsening symptoms and when to call back/seek emergent care. Pt verbalized understanding and agrees to plan.   Copied from CRM [id number]. Topic: Clinical - Red Word Triage >> Nov 14, 2023  4:04 PM Chrystal Crape R wrote: Pt called to sch a appointment asap for Swelling in both legs and ankles, has a confirmed Torn Gluteus Medius and have a surgery scheduled for that however she would like to get an understanding of what's going on with her legs and ankles Reason for Disposition  MILD or MODERATE ankle swelling (e.g., can't move joint normally, can't do usual activities) (Exceptions: Itchy, localized swelling; swelling is chronic.)  Answer Assessment - Initial Assessment Questions Chief Complaint: Leg and ankle swelling intermittent for years, worsening  Symptoms: Left leg is worse   Pertinent Negatives: Patient denies limping, swelling above knee, pain (has previously) difficulty breathing, chest pain  Protocols used: Ankle Swelling-A-AH

## 2023-11-15 ENCOUNTER — Ambulatory Visit (INDEPENDENT_AMBULATORY_CARE_PROVIDER_SITE_OTHER): Admitting: Family Medicine

## 2023-11-15 ENCOUNTER — Encounter: Payer: Self-pay | Admitting: Family Medicine

## 2023-11-15 VITALS — BP 124/72 | HR 75 | Temp 97.9°F | Ht 61.0 in | Wt 156.2 lb

## 2023-11-15 DIAGNOSIS — E7800 Pure hypercholesterolemia, unspecified: Secondary | ICD-10-CM

## 2023-11-15 DIAGNOSIS — M7989 Other specified soft tissue disorders: Secondary | ICD-10-CM

## 2023-11-15 LAB — CBC WITH DIFFERENTIAL/PLATELET
Absolute Lymphocytes: 1447 {cells}/uL (ref 850–3900)
Absolute Monocytes: 590 {cells}/uL (ref 200–950)
Basophils Absolute: 43 {cells}/uL (ref 0–200)
Basophils Relative: 0.6 %
Eosinophils Absolute: 43 {cells}/uL (ref 15–500)
Eosinophils Relative: 0.6 %
HCT: 40.7 % (ref 35.0–45.0)
Hemoglobin: 13.4 g/dL (ref 11.7–15.5)
MCH: 29.9 pg (ref 27.0–33.0)
MCHC: 32.9 g/dL (ref 32.0–36.0)
MCV: 90.8 fL (ref 80.0–100.0)
MPV: 10.6 fL (ref 7.5–12.5)
Monocytes Relative: 8.2 %
Neutro Abs: 5076 {cells}/uL (ref 1500–7800)
Neutrophils Relative %: 70.5 %
Platelets: 330 10*3/uL (ref 140–400)
RBC: 4.48 10*6/uL (ref 3.80–5.10)
RDW: 12.4 % (ref 11.0–15.0)
Total Lymphocyte: 20.1 %
WBC: 7.2 10*3/uL (ref 3.8–10.8)

## 2023-11-15 LAB — LIPID PANEL
Cholesterol: 184 mg/dL (ref <200)
HDL: 60 mg/dL (ref >=50)
LDL Cholesterol (Calc): 103 mg/dL — ABNORMAL HIGH
Non-HDL Cholesterol (Calc): 124 mg/dL (ref <130)
Total CHOL/HDL Ratio: 3.1 (calc) (ref <5.0)
Triglycerides: 110 mg/dL (ref <150)

## 2023-11-15 LAB — COMPREHENSIVE METABOLIC PANEL WITH GFR
AG Ratio: 1.8 (calc) (ref 1.0–2.5)
ALT: 15 U/L (ref 6–29)
AST: 14 U/L (ref 10–35)
Albumin: 4.2 g/dL (ref 3.6–5.1)
Alkaline phosphatase (APISO): 53 U/L (ref 37–153)
BUN: 20 mg/dL (ref 7–25)
CO2: 29 mmol/L (ref 20–32)
Calcium: 10 mg/dL (ref 8.6–10.4)
Chloride: 103 mmol/L (ref 98–110)
Creat: 0.66 mg/dL (ref 0.60–1.00)
Globulin: 2.3 g/dL (ref 1.9–3.7)
Glucose, Bld: 90 mg/dL (ref 65–99)
Potassium: 3.7 mmol/L (ref 3.5–5.3)
Sodium: 139 mmol/L (ref 135–146)
Total Bilirubin: 1.4 mg/dL — ABNORMAL HIGH (ref 0.2–1.2)
Total Protein: 6.5 g/dL (ref 6.1–8.1)
eGFR: 94 mL/min/{1.73_m2} (ref >=60)

## 2023-11-15 NOTE — Progress Notes (Signed)
 Subjective:    Patient ID: Taylor Singleton, female    DOB: 03-06-1956, 68 y.o.   MRN: [id number] Patient has noticed swelling in her legs left greater than right.  She has +1 pitting edema around her left ankle and her right ankle.  She has trace pitting edema to her mid shin bilaterally.  Has any chest pain or shortness of breath or dyspnea on exertion.  The swelling comes and goes and tends to be worse after she has been on her legs for a long period of time.  She has spider veins and varicose veins scattered around her legs. 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    Varicose vein of leg    Wears glasses    Past Surgical History:  Procedure Laterality Date   ANTERIOR AND POSTERIOR REPAIR WITH SACROSPINOUS FIXATION N/A 07/19/2022   Procedure: POSTERIOR REPAIR WITHP PERINEORRHAPHY AND  SACROSPINOUS FIXATION;  Surgeon: Arma Lamp, MD;  Location: [facility];  Service: Gynecology;  Laterality: N/A;  Total time requested is 1.5hrs   BREAST CYST EXCISION Left    CHOLECYSTECTOMY     CHOLECYSTECTOMY, LAPAROSCOPIC  2010   COLONOSCOPY WITH PROPOFOL  N/A 03/22/2023   Procedure: COLONOSCOPY WITH PROPOFOL ;  Surgeon: Urban Garden, MD;  [facility];  Service: Gastroenterology;  Laterality: N/A;  8:45AM;ASA 1   TOTAL ABDOMINAL HYSTERECTOMY W/ BILATERAL SALPINGOOPHORECTOMY     1990s   Current Outpatient Medications on File Prior to Visit  Medication Sig Dispense Refill   atorvastatin  (LIPITOR) 40 MG tablet Take 1 tablet (40 mg total) by mouth daily. 90 tablet 2   Calcium  Carb-Cholecalciferol (CALCIUM /VITAMIN D) 600-400 MG-UNIT TABS Take 2 tablets by mouth daily.     nystatin -triamcinolone  ointment (MYCOLOG) Apply  1 Application topically 2 (two) times daily. Apply to affected areas BID 60 g 1   oxybutynin  (DITROPAN  XL) 5 MG 24 hr tablet Take 1 tablet (5 mg total) by mouth at bedtime. 30 tablet 11   pantoprazole  (PROTONIX ) 40 MG tablet Take 1 tablet (40 mg total) by mouth daily. 90 tablet 2   No current facility-administered medications on file prior to visit.   No Known Allergies Social History   Socioeconomic History   Marital status: Married    Spouse name: Not on file   Number of children: 2   Years of education: Not on file   Highest education level: Not on file  Occupational History   Not on file  Tobacco Use   Smoking status: Never   Smokeless tobacco: Never  Vaping Use   Vaping status: Never Used  Substance and Sexual Activity   Alcohol use: No   Drug use: Never   Sexual activity: Not Currently    Birth control/protection: Surgical    Comment: hyst  Other Topics Concern   Not on file  Social History Narrative   Not on file   Social Drivers of Health   Financial Resource Strain: Low Risk  (05/25/2022)   Overall Financial Resource Strain (CARDIA)    Difficulty of Paying Living Expenses: Not hard at all  Food Insecurity: No Food Insecurity (05/25/2022)   Hunger Vital Sign    Worried About Running Out of Food  in the Last Year: Never true    Ran Out of Food in the Last Year: Never true  Transportation Needs: No Transportation Needs (05/25/2022)   PRAPARE - Administrator, Civil Service (Medical): No    Lack of Transportation (Non-Medical): No  Physical Activity: Insufficiently Active (05/25/2022)   Exercise Vital Sign    Days of Exercise per Week: 3 days    Minutes of Exercise per Session: 30 min  Stress: No Stress Concern Present (05/25/2022)   [employer] of Occupational Health - Occupational Stress Questionnaire    Feeling of Stress : Not at all  Social Connections: Moderately Isolated (05/25/2022)   Social Connection and Isolation Panel [NHANES]     Frequency of Communication with Friends and Family: Twice a week    Frequency of Social Gatherings with Friends and Family: Three times a week    Attends Religious Services: Never    Active Member of Clubs or Organizations: No    Attends Banker Meetings: Never    Marital Status: Married  Catering manager Violence: Not At Risk (05/25/2022)   Humiliation, Afraid, Rape, and Kick questionnaire    Fear of Current or Ex-Partner: No    Emotionally Abused: No    Physically Abused: No    Sexually Abused: No     Review of Systems  All other systems reviewed and are negative.      Objective:   Physical Exam Constitutional:      General: She is not in acute distress.    Appearance: Normal appearance. She is well-developed and normal weight. She is not ill-appearing or toxic-appearing.  HENT:     Nose: Nose normal.     Mouth/Throat:     Mouth: Mucous membranes are moist.     Pharynx: Oropharynx is clear. No oropharyngeal exudate or posterior oropharyngeal erythema.  Eyes:     Extraocular Movements: Extraocular movements intact.     Conjunctiva/sclera: Conjunctivae normal.     Pupils: Pupils are equal, round, and reactive to light.  Cardiovascular:     Rate and Rhythm: Normal rate and regular rhythm.     Heart sounds: Normal heart sounds.  Pulmonary:     Effort: Pulmonary effort is normal. No respiratory distress.     Breath sounds: No wheezing or rales.  Musculoskeletal:     Cervical back: Neck supple.     Right lower leg: Edema present.     Left lower leg: Edema present.  Lymphadenopathy:     Cervical: No cervical adenopathy.  Skin:    Findings: No rash.  Neurological:     Mental Status: She is alert and oriented to person, place, and time.     Cranial Nerves: No cranial nerve deficit, dysarthria or facial asymmetry.     Sensory: No sensory deficit.     Motor: No weakness, tremor, atrophy, abnormal muscle tone or seizure activity.     Coordination: Romberg sign  negative. Coordination normal. Finger-Nose-Finger Test normal.     Gait: Gait normal.     Deep Tendon Reflexes: Reflexes normal.  Psychiatric:        Mood and Affect: Mood normal.        Behavior: Behavior normal.        Thought Content: Thought content normal.        Judgment: Judgment normal.           Assessment & Plan:  Leg swelling - Plan: Brain natriuretic peptide, CBC with Differential/Platelet, Comprehensive metabolic panel  with GFR, Lipid panel  Pure hypercholesterolemia - Plan: Brain natriuretic peptide, CBC with Differential/Platelet, Comprehensive metabolic panel with GFR, Lipid panel I do not believe the leg swelling is due to congestive heart failure or blood clot.  I believe is due to chronic venous insufficiency.  I explained this to the patient.  We discussed using Lasix as needed however the patient is dealing with urinary incontinence.  Therefore I recommended using compression hose to help manage the swelling.  I will check a CBC a CMP a BNP and a lipid panel while the patient is here today

## 2023-11-16 LAB — BRAIN NATRIURETIC PEPTIDE: Brain Natriuretic Peptide: 12 pg/mL (ref <100)

## 2023-11-17 ENCOUNTER — Ambulatory Visit: Payer: Self-pay | Admitting: Family Medicine

## 2023-11-18 ENCOUNTER — Ambulatory Visit (HOSPITAL_BASED_OUTPATIENT_CLINIC_OR_DEPARTMENT_OTHER): Attending: Orthopaedic Surgery | Admitting: Physical Therapy

## 2023-11-18 ENCOUNTER — Encounter (HOSPITAL_BASED_OUTPATIENT_CLINIC_OR_DEPARTMENT_OTHER): Payer: Self-pay | Admitting: Physical Therapy

## 2023-11-18 DIAGNOSIS — M25552 Pain in left hip: Secondary | ICD-10-CM | POA: Diagnosis not present

## 2023-11-18 DIAGNOSIS — L304 Erythema intertrigo: Secondary | ICD-10-CM | POA: Diagnosis not present

## 2023-11-18 DIAGNOSIS — R2689 Other abnormalities of gait and mobility: Secondary | ICD-10-CM | POA: Insufficient documentation

## 2023-11-18 DIAGNOSIS — M25652 Stiffness of left hip, not elsewhere classified: Secondary | ICD-10-CM | POA: Diagnosis not present

## 2023-11-18 NOTE — Therapy (Signed)
 [facility] LOWER EXTREMITY TREATMENT   Patient Name: Taylor Singleton MRN: [id number] DOB:January 11, 1956, 68 y.o., female Today's Date: 11/18/2023  END OF SESSION:  PT End of Session - 11/18/23 0807     Visit Number 2    Number of Visits 12    Date for PT Re-Evaluation 12/16/23    PT Start Time 0805             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    Varicose vein of leg    Wears glasses    Past Surgical History:  Procedure Laterality Date   ANTERIOR AND POSTERIOR REPAIR WITH SACROSPINOUS FIXATION N/A 07/19/2022   Procedure: POSTERIOR REPAIR WITHP PERINEORRHAPHY AND  SACROSPINOUS FIXATION;  Surgeon: Arma Lamp, MD;  Location: [facility];  Service: Gynecology;  Laterality: N/A;  Total time requested is 1.5hrs   BREAST CYST EXCISION Left    CHOLECYSTECTOMY     CHOLECYSTECTOMY, LAPAROSCOPIC  2010   COLONOSCOPY WITH PROPOFOL  N/A 03/22/2023   Procedure: COLONOSCOPY WITH PROPOFOL ;  Surgeon: Urban Garden, MD;  [facility];  Service: Gastroenterology;  Laterality: N/A;  8:45AM;ASA 1   TOTAL ABDOMINAL HYSTERECTOMY W/ BILATERAL SALPINGOOPHORECTOMY     1990s   Patient Active Problem List   Diagnosis Date Noted   Candidiasis, intertrigo 08/26/2023   Seborrheic keratoses 08/26/2023   Greater trochanteric pain syndrome 05/30/2023   Acute cystitis without hematuria 05/30/2023   Encounter for colorectal cancer screening 03/22/2023   Nausea without vomiting 03/17/2023   Flatulence 03/17/2023   S/P hysterectomy 10/06/2022   Urinary frequency 10/06/2022   Vaginal burning 10/06/2022   Vaginal irritation 10/06/2022   Vulvar irritation 10/06/2022   Sebaceous cyst of labia  10/06/2022   BPPV (benign paroxysmal positional vertigo), left 01/29/2022   Laryngopharyngeal reflux (LPR) 01/29/2022   Pharyngoesophageal dysphagia 01/29/2022   HLD (hyperlipidemia)     PCP:  Dr Eliane Grooms MD  REFERRING PROVIDER: Dr Wilhelmenia Harada   REFERRING DIAG:  Diagnosis  M54.16 (ICD-10-CM) - Lumbar radiculopathy   THERAPY DIAG:  Pain in left hip  Stiffness of left hip, not elsewhere classified  Other abnormalities of gait and mobility  Rationale for Evaluation and Treatment: Rehabilitation  ONSET DATE:  Has had hip pain for several years   SUBJECTIVE:   SUBJECTIVE STATEMENT: She reports she has performed the HEP, but the bridge bothers her.  She has graduation party to go to in July (26th) in [place]. She's wondering if she should just go ahead and have the surgery for hip, instead of prolonging the inevitable.   From initial evaluation: The patient has a long history of left hip pain. She also had lumbar spine pain. She had a lumbar diskectomy earlier this year. She has recovered well from this but continued to have lateral  hip pain. She was found to have a gluteal tear. She comes in to therapy today for a pre-hab program prior to likely lateral hip fixation.  She has been exercising but is unsure what she can do with the hip. She is a member of the gym.   PERTINENT HISTORY: Multi joint OA, Osteoporosis, vaginal prolapse, sciatica,  PAIN:  Are you having pain?  Yes: NPRS scale: 2/10  Pain location: left hip  Pain description: achng  Aggravating factors: standing and walking  Relieving factors: rest; best first thing in morning  PRECAUTIONS: None  RED FLAGS: None   WEIGHT BEARING RESTRICTIONS: No  FALLS:  Has patient fallen in last 6 months? Yes. Number of falls a month ago. Caught foot on her purse  LIVING ENVIRONMENT: Has 5 steps up the stairs  OCCUPATION:  Works two days a week at [employer].   Recreation:  Shop/ go out to lunch    PLOF:  Independent  PATIENT GOALS:    NEXT MD VISIT: Nothing scheduled   OBJECTIVE:  Note: Objective measures were completed at Evaluation unless otherwise noted.  DIAGNOSTIC FINDINGS:   MRI: IMPRESSION: 1. Complete tear of the left gluteus minimus tendon insertion. Full-thickness tear of the anterior aspect of the left gluteus medius tendon insertion. 2. Left anterior superior labral tear. 3. Mild osteoarthritis of bilateral hips. PATIENT SURVEYS:  LEFS  Extreme difficulty/unable (0), Quite a bit of difficulty (1), Moderate difficulty (2), Little difficulty (3), No difficulty (4) Survey date:    Any of your usual work, housework or school activities   2. Usual hobbies, recreational or sporting activities   3. Getting into/out of the bath   4. Walking between rooms   5. Putting on socks/shoes   6. Squatting    7. Lifting an object, like a bag of groceries from the floor   8. Performing light activities around your home   9. Performing heavy activities around your home   10. Getting into/out of a car   11. Walking 2 blocks   12. Walking 1 mile   13. Going up/down 10 stairs (1 flight)   14. Standing for 1 hour   15.  sitting for 1 hour   16. Running on even ground   17. Running on uneven ground   18. Making sharp turns while running fast   19. Hopping    20. Rolling over in bed   Score total:  29/80     COGNITION: Overall cognitive status: Within functional limits for tasks assessed     SENSATION: WFL  EDEMA:  None  MUSCLE LENGTH:  POSTURE: No Significant postural limitations  PALPATION:   LOWER EXTREMITY ROM:  Passive ROM Right eval Left eval  Hip flexion  Pain past 90 degrees   Hip extension    Hip abduction    Hip adduction    Hip internal rotation    Hip external rotation  Pain at end range   Knee flexion    Knee extension    Ankle dorsiflexion    Ankle plantarflexion    Ankle inversion    Ankle eversion     (Blank rows = not tested)  LOWER  EXTREMITY MMT:  MMT Right eval Left eval  Hip flexion 23 20.9  Hip extension    Hip abduction 24.5 15.5  Hip adduction    Hip internal rotation    Hip external rotation    Knee flexion    Knee extension 27.5 27.9  Ankle dorsiflexion    Ankle plantarflexion    Ankle inversion    Ankle eversion     (Blank rows = not tested)   GAIT: Mild decrease in left weight bearing  TREATMENT DATE:  [facility] Adult PT Treatment:                                                DATE: 11/18/23  Therapeutic Exercise: NuStep L4, LEs only: x 5 min for warm up STS from NuStep x 10 Seated with yellow band around ankles: LAQ 2 x10 Seated with yellow hand around thighs:  hip abdct 2 x 10, 2nd set with 3 sec pause; seated marching x 15 Bridge with cues for lat press, glute and ab set - 2 x 10, 2nd set with 3 sec pause Supine piriformis stretch x 15s x 2 reps each    PATIENT EDUCATION:  Education details: HEP, symptom management  Person educated: Patient Education method: Explanation, Demonstration, Tactile cues, Verbal cues, and Handouts Education comprehension: verbalized understanding, returned demonstration, verbal cues required, tactile cues required, and needs further education  HOME EXERCISE PROGRAM: Access Code: [email or web address] URL: [email or web address] Date: 11/07/2023 Prepared by: Signa Drier  Exercises - Supine Bridge  - 1 x daily - 7 x weekly - 3 sets - 10 reps - Supine March  - 1 x daily - 7 x weekly - 3 sets - 10 reps - Seated Knee Extension with Resistance  - 1 x daily - 7 x weekly - 3 sets - 10 reps - Seated Hip Abduction with Resistance  - 1 x daily - 7 x weekly - 3 sets - 10 reps  ASSESSMENT:  CLINICAL IMPRESSION: Pt tolerated all exercises well, including bridge, when given cues for improved engagement and form.  No increase in pain. Frequent  cues to slow pace of exercise, and to pause in the contraction phase.  Updated HEP.  Will progress as tolerated.  Goals are ongoing.   From initial evaluation:  Patient is a 68 year old female presents to physical therapy left hip pain.  MRI shows complete tear of the glute min and tendon and a full-thickness tear of the anterior portion of the gluteus medius tendon.  She presents with tenderness to palpation, left hip abductor weakness, and increased pain with general functional mobility.  She would benefit from skilled therapy to improve strength and general functional mobility.  She is likely going to have surgery in the future.  Will build her an exercise program to strengthen prior to her procedure  OBJECTIVE IMPAIRMENTS: Abnormal gait, decreased activity tolerance, decreased mobility, difficulty walking, decreased ROM, decreased strength, and pain.   ACTIVITY LIMITATIONS: carrying, lifting, bending, sitting, standing, squatting, sleeping, stairs, transfers, and locomotion level  PARTICIPATION LIMITATIONS: meal prep, cleaning, laundry, driving, shopping, community activity, occupation, and yard work  PERSONAL FACTORS: 1-2 comorbidities: multi joint OA, back pain, osteoporosis  are also affecting patient's functional outcome.   REHAB POTENTIAL: Good  CLINICAL DECISION MAKING: Evolving/moderate complexity progressive increase in pain   EVALUATION COMPLEXITY: Low   GOALS: Goals reviewed with patient? Yes  SHORT TERM GOALS: Target date: 12/02/2023   Patient will increase hip abduction strength on the left by 5 pounds Baseline: Goal status: INITIAL  2.  Patient will report a 50% reduction in pain when she lies on her left side Baseline:  Goal status: INITIAL  3.  Patient will be independent with basic HEP Baseline:  Goal status: INITIAL   LONG TERM GOALS: Target date: 12/30/2023      Patient will have complete home exercise program prior to  surgical intervention Baseline:   Goal status: INITIAL  PLAN:  PT FREQUENCY: 1-2x/week  PT DURATION: 8 weeks  PLANNED INTERVENTIONS: 97110-Therapeutic exercises, 97530- Therapeutic activity, [id number]- Neuromuscular re-education, 97535- Self Care, [id number]- Manual therapy, [id number]- Gait training, [phone]- Aquatic Therapy, [id number]- Electrical stimulation (unattended), 97035- Ultrasound, Patient/Family education, Stair training, Taping, Dry Needling, DME instructions, Cryotherapy, and Moist heat   PLAN FOR NEXT SESSION:  Review HEP.  If patient tolerates well consider light gym exercises.  Keep exercises light at this time.  Consider manual therapy if needed.  Consider standing hip 3-way series.  Almedia Jacobsen, PTA 11/18/23 8:57 AM [facility] [address] [place], [place], [address] Phone: [phone]   Fax:  [phone]

## 2023-11-21 ENCOUNTER — Ambulatory Visit (HOSPITAL_BASED_OUTPATIENT_CLINIC_OR_DEPARTMENT_OTHER): Admitting: Physical Therapy

## 2023-11-21 ENCOUNTER — Encounter (HOSPITAL_BASED_OUTPATIENT_CLINIC_OR_DEPARTMENT_OTHER): Payer: Self-pay | Admitting: Physical Therapy

## 2023-11-21 DIAGNOSIS — R2689 Other abnormalities of gait and mobility: Secondary | ICD-10-CM

## 2023-11-21 DIAGNOSIS — M25652 Stiffness of left hip, not elsewhere classified: Secondary | ICD-10-CM

## 2023-11-21 DIAGNOSIS — M25552 Pain in left hip: Secondary | ICD-10-CM

## 2023-11-21 NOTE — Therapy (Signed)
 [facility] OUTPATIENT PHYSICAL THERAPY LOWER EXTREMITY TREATMENT   Patient Name: Taylor Singleton MRN: [id number] DOB:12-23-1955, 68 y.o., female Today's Date: 11/21/2023  END OF SESSION:  PT End of Session - 11/21/23 1023     Visit Number 3    Number of Visits 12    Date for PT Re-Evaluation 12/16/23    PT Start Time 1016    PT Stop Time 1054    PT Time Calculation (min) 38 min    Behavior During Therapy WFL for tasks assessed/performed             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    Varicose vein of leg    Wears glasses    Past Surgical History:  Procedure Laterality Date   ANTERIOR AND POSTERIOR REPAIR WITH SACROSPINOUS FIXATION N/A 07/19/2022   Procedure: POSTERIOR REPAIR WITHP PERINEORRHAPHY AND  SACROSPINOUS FIXATION;  Surgeon: Arma Lamp, MD;  Location: [facility];  Service: Gynecology;  Laterality: N/A;  Total time requested is 1.5hrs   BREAST CYST EXCISION Left    CHOLECYSTECTOMY     CHOLECYSTECTOMY, LAPAROSCOPIC  2010   COLONOSCOPY WITH PROPOFOL  N/A 03/22/2023   Procedure: COLONOSCOPY WITH PROPOFOL ;  Surgeon: Urban Garden, MD;  [facility];  Service: Gastroenterology;  Laterality: N/A;  8:45AM;ASA 1   TOTAL ABDOMINAL HYSTERECTOMY W/ BILATERAL SALPINGOOPHORECTOMY     1990s   Patient Active Problem List   Diagnosis Date Noted   Candidiasis, intertrigo 08/26/2023   Seborrheic keratoses 08/26/2023   Greater trochanteric pain syndrome 05/30/2023   Acute cystitis without hematuria 05/30/2023   Encounter for colorectal cancer screening 03/22/2023   Nausea without vomiting 03/17/2023   Flatulence 03/17/2023   S/P hysterectomy 10/06/2022   Urinary frequency 10/06/2022   Vaginal  burning 10/06/2022   Vaginal irritation 10/06/2022   Vulvar irritation 10/06/2022   Sebaceous cyst of labia 10/06/2022   BPPV (benign paroxysmal positional vertigo), left 01/29/2022   Laryngopharyngeal reflux (LPR) 01/29/2022   Pharyngoesophageal dysphagia 01/29/2022   HLD (hyperlipidemia)     PCP:  Dr Eliane Grooms MD  REFERRING PROVIDER: Dr Wilhelmenia Harada   REFERRING DIAG:  Diagnosis  M54.16 (ICD-10-CM) - Lumbar radiculopathy   THERAPY DIAG:  Pain in left hip  Stiffness of left hip, not elsewhere classified  Other abnormalities of gait and mobility  Rationale for Evaluation and Treatment: Rehabilitation  ONSET DATE:  Has had hip pain for several years   SUBJECTIVE:   SUBJECTIVE STATEMENT: She reports the bridge exercise is going better. She doesn't necessarily like the band for exercises.  She has graduation party to go to in July (26th) in [place]. She's wondering if she should just go ahead and have the surgery for hip, instead of prolonging the inevitable.   From initial evaluation: The patient has a long history of left hip pain. She also had lumbar spine pain. She had a lumbar diskectomy earlier this year. She has recovered well from this but continued to have lateral  hip pain. She was found to have a gluteal tear. She comes in to therapy today for a pre-hab program prior to likely lateral hip fixation.  She has been exercising but is unsure what she  can do with the hip. She is a member of the gym.   PERTINENT HISTORY: Multi joint OA, Osteoporosis, vaginal prolapse, sciatica,  PAIN:  Are you having pain? no: NPRS scale: 0/10  Pain location: left hip  Pain description: achng  Aggravating factors: standing and walking  Relieving factors: rest; best first thing in morning  PRECAUTIONS: None  RED FLAGS: None   WEIGHT BEARING RESTRICTIONS: No  FALLS:  Has patient fallen in last 6 months? Yes. Number of falls a month ago. Caught foot on her purse  LIVING  ENVIRONMENT: Has 5 steps up the stairs  OCCUPATION:  Works two days a week at [employer].   Recreation:  Shop/ go out to lunch    PLOF: Independent  PATIENT GOALS:    NEXT MD VISIT: Nothing scheduled   OBJECTIVE:  Note: Objective measures were completed at Evaluation unless otherwise noted.  DIAGNOSTIC FINDINGS:   MRI: IMPRESSION: 1. Complete tear of the left gluteus minimus tendon insertion. Full-thickness tear of the anterior aspect of the left gluteus medius tendon insertion. 2. Left anterior superior labral tear. 3. Mild osteoarthritis of bilateral hips. PATIENT SURVEYS:  LEFS  Extreme difficulty/unable (0), Quite a bit of difficulty (1), Moderate difficulty (2), Little difficulty (3), No difficulty (4) Survey date:    Any of your usual work, housework or school activities   2. Usual hobbies, recreational or sporting activities   3. Getting into/out of the bath   4. Walking between rooms   5. Putting on socks/shoes   6. Squatting    7. Lifting an object, like a bag of groceries from the floor   8. Performing light activities around your home   9. Performing heavy activities around your home   10. Getting into/out of a car   11. Walking 2 blocks   12. Walking 1 mile   13. Going up/down 10 stairs (1 flight)   14. Standing for 1 hour   15.  sitting for 1 hour   16. Running on even ground   17. Running on uneven ground   18. Making sharp turns while running fast   19. Hopping    20. Rolling over in bed   Score total:  29/80     COGNITION: Overall cognitive status: Within functional limits for tasks assessed     SENSATION: WFL  EDEMA:  None  MUSCLE LENGTH:  POSTURE: No Significant postural limitations  PALPATION:   LOWER EXTREMITY ROM:  Passive ROM Right eval Left eval  Hip flexion  Pain past 90 degrees   Hip extension    Hip abduction    Hip adduction    Hip internal rotation    Hip external rotation  Pain at end range   Knee flexion     Knee extension    Ankle dorsiflexion    Ankle plantarflexion    Ankle inversion    Ankle eversion     (Blank rows = not tested)  LOWER EXTREMITY MMT:  MMT Right eval Left eval  Hip flexion 23 20.9  Hip extension    Hip abduction 24.5 15.5  Hip adduction    Hip internal rotation    Hip external rotation    Knee flexion    Knee extension 27.5 27.9  Ankle dorsiflexion    Ankle plantarflexion    Ankle inversion    Ankle eversion     (Blank rows = not tested)   GAIT: Mild decrease in left weight bearing  TREATMENT DATE:  [facility] Adult PT Treatment:                                                DATE: 11/21/23  Therapeutic Exercise: NuStep L4, LEs only: x 5 min for warm up STS from NuStep 2 x 10, cues for even weight between feet  L clam 2 x 10, R clam x 10 L hip abdct x 10 (unable to tolerate L sidelying for R hip abdct) Bridge with cues for lat press, glute and ab set x 12, 3 sec pause Seated with red band around ankles: LAQ  x10 LLE Standing hip abdct x 10 each LE Side marching with increased step height Tandem stance x 20s each LE forward seated piriformis stretch x 15s x 2 reps each    [facility] Adult PT Treatment:                                                DATE: 11/18/23  Therapeutic Exercise: NuStep L4, LEs only: x 5 min for warm up STS from NuStep x 10 Seated with yellow band around ankles: LAQ 2 x10 Seated with yellow hand around thighs:  hip abdct 2 x 10, 2nd set with 3 sec pause; seated marching x 15 Bridge with cues for lat press, glute and ab set - 2 x 10, 2nd set with 3 sec pause Supine piriformis stretch x 15s x 2 reps each    PATIENT EDUCATION:  Education details: HEP, symptom management  Person educated: Patient Education method: Explanation, Demonstration, Tactile cues, Verbal cues, and Handouts Education comprehension:  verbalized understanding, returned demonstration, verbal cues required, tactile cues required, and needs further education  HOME EXERCISE PROGRAM: Access Code: [email or web address] URL: [email or web address] Date: 11/07/2023 Prepared by: Signa Drier  Exercises - Supine Bridge  - 1 x daily - 7 x weekly - 3 sets - 10 reps - Supine March  - 1 x daily - 7 x weekly - 3 sets - 10 reps - Seated Knee Extension with Resistance  - 1 x daily - 7 x weekly - 3 sets - 10 reps - Seated Hip Abduction with Resistance  - 1 x daily - 7 x weekly - 3 sets - 10 reps  ASSESSMENT:  CLINICAL IMPRESSION: Pt tolerated all exercises well, except L sidelying exercises as this irritated hip too much.  No increase in pain during session, besides discomfort when in L sidelying. . Frequent cues to slow pace of exercise, and to pause in the contraction phase.  Updated HEP.  Will progress as tolerated.  Goals are ongoing. Therapist to ck STG next session as time allows.   From initial evaluation:  Patient is a 68 year old female presents to physical therapy left hip pain.  MRI shows complete tear of the glute min and tendon and a full-thickness tear of the anterior portion of the gluteus medius tendon.  She presents with tenderness to palpation, left hip abductor weakness, and increased pain with general functional mobility.  She would benefit from skilled therapy to improve strength and general functional mobility.  She is likely going to have surgery in the future.  Will build her an exercise program to strengthen prior to her procedure  OBJECTIVE IMPAIRMENTS:  Abnormal gait, decreased activity tolerance, decreased mobility, difficulty walking, decreased ROM, decreased strength, and pain.   ACTIVITY LIMITATIONS: carrying, lifting, bending, sitting, standing, squatting, sleeping, stairs, transfers, and locomotion level  PARTICIPATION LIMITATIONS: meal prep, cleaning, laundry, driving, shopping, community activity,  occupation, and yard work  PERSONAL FACTORS: 1-2 comorbidities: multi joint OA, back pain, osteoporosis  are also affecting patient's functional outcome.   REHAB POTENTIAL: Good  CLINICAL DECISION MAKING: Evolving/moderate complexity progressive increase in pain   EVALUATION COMPLEXITY: Low   GOALS: Goals reviewed with patient? Yes  SHORT TERM GOALS: Target date: 12/02/2023   Patient will increase hip abduction strength on the left by 5 pounds Baseline: Goal status: INITIAL  2.  Patient will report a 50% reduction in pain when she lies on her left side Baseline:  Goal status: INITIAL  3.  Patient will be independent with basic HEP Baseline:  Goal status: INITIAL   LONG TERM GOALS: Target date: 12/30/2023      Patient will have complete home exercise program prior to  surgical intervention Baseline:  Goal status: INITIAL  PLAN:  PT FREQUENCY: 1-2x/week  PT DURATION: 8 weeks  PLANNED INTERVENTIONS: 97110-Therapeutic exercises, 97530- Therapeutic activity, [id number]- Neuromuscular re-education, 97535- Self Care, 16109- Manual therapy, [id number]- Gait training, [id number]- Aquatic Therapy, [id number]- Electrical stimulation (unattended), 97035- Ultrasound, Patient/Family education, Stair training, Taping, Dry Needling, DME instructions, Cryotherapy, and Moist heat   PLAN FOR NEXT SESSION:  Review HEP.  If patient tolerates well consider light gym exercises.  Keep exercises light at this time.  Consider manual therapy if needed.  Consider standing hip 3-way series.  Almedia Jacobsen, PTA 11/21/23 12:53 PM [facility] [address] [place], [place], [address] Phone: [phone]   Fax:  [phone]

## 2023-11-23 ENCOUNTER — Ambulatory Visit (HOSPITAL_BASED_OUTPATIENT_CLINIC_OR_DEPARTMENT_OTHER): Payer: Self-pay | Admitting: Orthopaedic Surgery

## 2023-11-23 ENCOUNTER — Other Ambulatory Visit (HOSPITAL_BASED_OUTPATIENT_CLINIC_OR_DEPARTMENT_OTHER): Payer: Self-pay

## 2023-11-23 ENCOUNTER — Ambulatory Visit (HOSPITAL_BASED_OUTPATIENT_CLINIC_OR_DEPARTMENT_OTHER): Admitting: Orthopaedic Surgery

## 2023-11-23 ENCOUNTER — Encounter (HOSPITAL_BASED_OUTPATIENT_CLINIC_OR_DEPARTMENT_OTHER): Payer: Self-pay

## 2023-11-23 DIAGNOSIS — M25552 Pain in left hip: Secondary | ICD-10-CM | POA: Diagnosis not present

## 2023-11-23 DIAGNOSIS — S76012A Strain of muscle, fascia and tendon of left hip, initial encounter: Secondary | ICD-10-CM

## 2023-11-23 MED ORDER — ACETAMINOPHEN 500 MG PO TABS
500.0000 mg | ORAL_TABLET | Freq: Three times a day (TID) | ORAL | 0 refills | Status: AC
  Filled 2023-11-23: qty 30, 10d supply, fill #0

## 2023-11-23 MED ORDER — OXYCODONE HCL 5 MG PO TABS
5.0000 mg | ORAL_TABLET | ORAL | 0 refills | Status: DC | PRN
  Filled 2023-11-23: qty 30, 5d supply, fill #0

## 2023-11-23 MED ORDER — IBUPROFEN 800 MG PO TABS
800.0000 mg | ORAL_TABLET | Freq: Three times a day (TID) | ORAL | 0 refills | Status: AC
  Filled 2023-11-23: qty 30, 10d supply, fill #0

## 2023-11-23 MED ORDER — ASPIRIN 325 MG PO TBEC
325.0000 mg | DELAYED_RELEASE_TABLET | Freq: Every day | ORAL | 0 refills | Status: DC
  Filled 2023-11-23: qty 14, 14d supply, fill #0

## 2023-11-23 NOTE — Progress Notes (Signed)
 Chief Complaint: Hip pain     History of Present Illness:   11/23/2023: Presents today for follow-up of her left hip.  She is still having persistent symptoms which are progressive.  Her injection is no longer been effective  Taylor Singleton is a 68 y.o. female today with ongoing left hip pain in the setting of chronic symptoms about the posterior buttocks radiating to the side.  She is status post multiple epidural injections and ultimately lumbar discectomy with Dr. Rochelle Chu.  She is still having persistent pain about the lateral aspect of the hip.  She does have weakness with shortening of her stride and difficulty laying directly on the side as well as getting in and out of cars.  She does enjoy staying active but this has been limited as a result of her hip pain.  She has been seeing Dr. Lucienne Ryder who is recommended an MRI and further follow-up for discussion    PMH/PSH/Family History/Social History/Meds/Allergies:    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    Varicose vein of leg    Wears glasses    Past Surgical History:  Procedure Laterality Date   ANTERIOR AND POSTERIOR REPAIR WITH SACROSPINOUS FIXATION N/A 07/19/2022   Procedure: POSTERIOR REPAIR WITHP PERINEORRHAPHY AND  SACROSPINOUS FIXATION;  Surgeon: Arma Lamp, MD;  Location: [facility];  Service: Gynecology;  Laterality: N/A;  Total time requested is 1.5hrs   BREAST CYST EXCISION Left    CHOLECYSTECTOMY     CHOLECYSTECTOMY, LAPAROSCOPIC  2010   COLONOSCOPY WITH PROPOFOL  N/A 03/22/2023   Procedure: COLONOSCOPY WITH PROPOFOL ;  Surgeon: Urban Garden, MD;  [facility];  Service: Gastroenterology;  Laterality: N/A;  8:45AM;ASA 1    TOTAL ABDOMINAL HYSTERECTOMY W/ BILATERAL SALPINGOOPHORECTOMY     1990s   Social History   Socioeconomic History   Marital status: Married    Spouse name: Not on file   Number of children: 2   Years of education: Not on file   Highest education level: Not on file  Occupational History   Not on file  Tobacco Use   Smoking status: Never   Smokeless tobacco: Never  Vaping Use   Vaping status: Never Used  Substance and Sexual Activity   Alcohol use: No   Drug use: Never   Sexual activity: Not Currently    Birth control/protection: Surgical    Comment: hyst  Other Topics Concern   Not on file  Social History Narrative   Not on file   Social Drivers of Health   Financial Resource Strain: Low Risk  (05/25/2022)   Overall Financial Resource Strain (CARDIA)    Difficulty of Paying Living Expenses: Not hard at all  Food Insecurity: No Food Insecurity (05/25/2022)   Hunger Vital Sign    Worried About Running Out of Food in the Last Year: Never true    Ran Out of Food in the Last Year: Never true  Transportation Needs: No Transportation Needs (05/25/2022)   PRAPARE - Administrator, Civil Service (Medical): No    Lack of Transportation (Non-Medical): No  Physical Activity: Insufficiently Active (05/25/2022)   Exercise Vital Sign    Days of Exercise  per Week: 3 days    Minutes of Exercise per Session: 30 min  Stress: No Stress Concern Present (05/25/2022)   [employer] of Occupational Health - Occupational Stress Questionnaire    Feeling of Stress : Not at all  Social Connections: Moderately Isolated (05/25/2022)   Social Connection and Isolation Panel [NHANES]    Frequency of Communication with Friends and Family: Twice a week    Frequency of Social Gatherings with Friends and Family: Three times a week    Attends Religious Services: Never    Active Member of Clubs or Organizations: No    Attends Banker Meetings: Never    Marital Status:  Married   Family History  Problem Relation Age of Onset   Diabetes Mother    Heart attack Brother    Breast cancer Neg Hx    No Known Allergies Current Outpatient Medications  Medication Sig Dispense Refill   atorvastatin  (LIPITOR) 40 MG tablet Take 1 tablet (40 mg total) by mouth daily. 90 tablet 2   Calcium  Carb-Cholecalciferol (CALCIUM /VITAMIN D) 600-400 MG-UNIT TABS Take 2 tablets by mouth daily.     nystatin -triamcinolone  ointment (MYCOLOG) Apply 1 Application topically 2 (two) times daily. Apply to affected areas BID 60 g 1   oxybutynin  (DITROPAN  XL) 5 MG 24 hr tablet Take 1 tablet (5 mg total) by mouth at bedtime. 30 tablet 11   pantoprazole  (PROTONIX ) 40 MG tablet Take 1 tablet (40 mg total) by mouth daily. 90 tablet 2   No current facility-administered medications for this visit.   No results found.  Review of Systems:   A ROS was performed including pertinent positives and negatives as documented in the HPI.  Physical Exam :   Constitutional: NAD and appears stated age Neurological: Alert and oriented Psych: Appropriate affect and cooperative There were no vitals taken for this visit.   Comprehensive Musculoskeletal Exam:    Left hip with tenderness about the gluteus as well as trochanteric insertion.  There is weakness with resisted abduction and Trendelenburg gait.  30 degrees internal rotation of the left hip without pain and negative FADIR or FABER   Imaging:   Xray (left hip): Normal osteoarthritis left hip  MRI (left hip): Insertional tearing of the gluteus medius insertion with some atrophy on the T1 view   I personally reviewed and interpreted the radiographs.   Assessment and Plan:   68 y.o. female with left gluteus medius insertional tendinopathy in the setting of chronic left hip pain.  At this time she has had both therapy as well as injections.  Given this we did discuss treatment options.  I did discuss that I do see some atrophy on the T1 MRI.   Given this overall I do believe that she would benefit from a left hip gluteus maximus tendon transfer.  I did discuss the risks and benefits as well as the associated recovery time for.  After discussion she would like to proceed with this   - Plan for left hip gluteus maximus tendon transfer   After a lengthy discussion of treatment options, including risks, benefits, alternatives, complications of surgical and nonsurgical conservative options, the patient elected surgical repair.   The patient  is aware of the material risks  and complications including, but not limited to injury to adjacent structures, neurovascular injury, infection, numbness, bleeding, implant failure, thermal burns, stiffness, persistent pain, failure to heal, disease transmission from allograft, need for further surgery, dislocation, anesthetic risks, blood clots, risks of death,and  others. The probabilities of surgical success and failure discussed with patient given their particular co-morbidities.The time and nature of expected rehabilitation and recovery was discussed.The patient's questions were all answered preoperatively.  No barriers to understanding were noted. I explained the natural history of the disease process and Rx rationale.  I explained to the patient what I considered to be reasonable expectations given their personal situation.  The final treatment plan was arrived at through a shared patient decision making process model.   I personally saw and evaluated the patient, and participated in the management and treatment plan.  Wilhelmenia Harada, MD Attending Physician, Orthopedic Surgery  This document was dictated using Dragon voice recognition software. A reasonable attempt at proof reading has been made to minimize errors.

## 2023-11-24 ENCOUNTER — Ambulatory Visit (INDEPENDENT_AMBULATORY_CARE_PROVIDER_SITE_OTHER): Payer: Medicare HMO | Admitting: *Deleted

## 2023-11-24 DIAGNOSIS — Z0000 Encounter for general adult medical examination without abnormal findings: Secondary | ICD-10-CM | POA: Diagnosis not present

## 2023-11-24 DIAGNOSIS — Z780 Asymptomatic menopausal state: Secondary | ICD-10-CM | POA: Diagnosis not present

## 2023-11-24 NOTE — Patient Instructions (Signed)
 Taylor Singleton , Thank you for taking time to come for your Medicare Wellness Visit. I appreciate your ongoing commitment to your health goals. Please review the following plan we discussed and let me know if I can assist you in the future.   Screening recommendations/referrals: Colonoscopy:  Mammogram:  Bone Density:  Recommended yearly ophthalmology/optometry visit for glaucoma screening and checkup Recommended yearly dental visit for hygiene and checkup  Vaccinations: Influenza vaccine:  Pneumococcal vaccine:  Tdap vaccine:  Shingles vaccine:         Preventive Care 65 Years and Older, Female Preventive care refers to lifestyle choices and visits with your health care provider that can promote health and wellness. What does preventive care include? A yearly physical exam. This is also called an annual well check. Dental exams once or twice a year. Routine eye exams. Ask your health care provider how often you should have your eyes checked. Personal lifestyle choices, including: Daily care of your teeth and gums. Regular physical activity. Eating a healthy diet. Avoiding tobacco and drug use. Limiting alcohol use. Practicing safe sex. Taking low-dose aspirin every day. Taking vitamin and mineral supplements as recommended by your health care provider. What happens during an annual well check? The services and screenings done by your health care provider during your annual well check will depend on your age, overall health, lifestyle risk factors, and family history of disease. Counseling  Your health care provider may ask you questions about your: Alcohol use. Tobacco use. Drug use. Emotional well-being. Home and relationship well-being. Sexual activity. Eating habits. History of falls. Memory and ability to understand (cognition). Work and work Astronomer. Reproductive health. Screening  You may have the following tests or measurements: Height, weight, and  BMI. Blood pressure. Lipid and cholesterol levels. These may be checked every 5 years, or more frequently if you are over 61 years old. Skin check. Lung cancer screening. You may have this screening every year starting at age 19 if you have a 30-pack-year history of smoking and currently smoke or have quit within the past 15 years. Fecal occult blood test (FOBT) of the stool. You may have this test every year starting at age 85. Flexible sigmoidoscopy or colonoscopy. You may have a sigmoidoscopy every 5 years or a colonoscopy every 10 years starting at age 29. Hepatitis C blood test. Hepatitis B blood test. Sexually transmitted disease (STD) testing. Diabetes screening. This is done by checking your blood sugar (glucose) after you have not eaten for a while (fasting). You may have this done every 1-3 years. Bone density scan. This is done to screen for osteoporosis. You may have this done starting at age 41. Mammogram. This may be done every 1-2 years. Talk to your health care provider about how often you should have regular mammograms. Talk with your health care provider about your test results, treatment options, and if necessary, the need for more tests. Vaccines  Your health care provider may recommend certain vaccines, such as: Influenza vaccine. This is recommended every year. Tetanus, diphtheria, and acellular pertussis (Tdap, Td) vaccine. You may need a Td booster every 10 years. Zoster vaccine. You may need this after age 22. Pneumococcal 13-valent conjugate (PCV13) vaccine. One dose is recommended after age 17. Pneumococcal polysaccharide (PPSV23) vaccine. One dose is recommended after age 28. Talk to your health care provider about which screenings and vaccines you need and how often you need them. This information is not intended to replace advice given to you by your  health care provider. Make sure you discuss any questions you have with your health care provider. Document  Released: 06/27/2015 Document Revised: 02/18/2016 Document Reviewed: 04/01/2015 Elsevier Interactive Patient Education  2017 [employer].  Fall Prevention in the Home Falls can cause injuries. They can happen to people of all ages. There are many things you can do to make your home safe and to help prevent falls. What can I do on the outside of my home? Regularly fix the edges of walkways and driveways and fix any cracks. Remove anything that might make you trip as you walk through a door, such as a raised step or threshold. Trim any bushes or trees on the path to your home. Use bright outdoor lighting. Clear any walking paths of anything that might make someone trip, such as rocks or tools. Regularly check to see if handrails are loose or broken. Make sure that both sides of any steps have handrails. Any raised decks and porches should have guardrails on the edges. Have any leaves, snow, or ice cleared regularly. Use sand or salt on walking paths during winter. Clean up any spills in your garage right away. This includes oil or grease spills. What can I do in the bathroom? Use night lights. Install grab bars by the toilet and in the tub and shower. Do not use towel bars as grab bars. Use non-skid mats or decals in the tub or shower. If you need to sit down in the shower, use a plastic, non-slip stool. Keep the floor dry. Clean up any water that spills on the floor as soon as it happens. Remove soap buildup in the tub or shower regularly. Attach bath mats securely with double-sided non-slip rug tape. Do not have throw rugs and other things on the floor that can make you trip. What can I do in the bedroom? Use night lights. Make sure that you have a light by your bed that is easy to reach. Do not use any sheets or blankets that are too big for your bed. They should not hang down onto the floor. Have a firm chair that has side arms. You can use this for support while you get dressed. Do  not have throw rugs and other things on the floor that can make you trip. What can I do in the kitchen? Clean up any spills right away. Avoid walking on wet floors. Keep items that you use a lot in easy-to-reach places. If you need to reach something above you, use a strong step stool that has a grab bar. Keep electrical cords out of the way. Do not use floor polish or wax that makes floors slippery. If you must use wax, use non-skid floor wax. Do not have throw rugs and other things on the floor that can make you trip. What can I do with my stairs? Do not leave any items on the stairs. Make sure that there are handrails on both sides of the stairs and use them. Fix handrails that are broken or loose. Make sure that handrails are as long as the stairways. Check any carpeting to make sure that it is firmly attached to the stairs. Fix any carpet that is loose or worn. Avoid having throw rugs at the top or bottom of the stairs. If you do have throw rugs, attach them to the floor with carpet tape. Make sure that you have a light switch at the top of the stairs and the bottom of the stairs. If you do  not have them, ask someone to add them for you. What else can I do to help prevent falls? Wear shoes that: Do not have high heels. Have rubber bottoms. Are comfortable and fit you well. Are closed at the toe. Do not wear sandals. If you use a stepladder: Make sure that it is fully opened. Do not climb a closed stepladder. Make sure that both sides of the stepladder are locked into place. Ask someone to hold it for you, if possible. Clearly mark and make sure that you can see: Any grab bars or handrails. First and last steps. Where the edge of each step is. Use tools that help you move around (mobility aids) if they are needed. These include: Canes. Walkers. Scooters. Crutches. Turn on the lights when you go into a dark area. Replace any light bulbs as soon as they burn out. Set up your  furniture so you have a clear path. Avoid moving your furniture around. If any of your floors are uneven, fix them. If there are any pets around you, be aware of where they are. Review your medicines with your doctor. Some medicines can make you feel dizzy. This can increase your chance of falling. Ask your doctor what other things that you can do to help prevent falls. This information is not intended to replace advice given to you by your health care provider. Make sure you discuss any questions you have with your health care provider. Document Released: 03/27/2009 Document Revised: 11/06/2015 Document Reviewed: 07/05/2014 Elsevier Interactive Patient Education  2017 [employer].

## 2023-11-24 NOTE — Progress Notes (Signed)
 Subjective:   Monteen Toops is a 68 y.o. female who presents for Medicare Annual (Subsequent) preventive examination.  Visit Complete: Virtual I connected with  Dovie Gell on 11/24/23 by a audio enabled telemedicine application and verified that I am speaking with the correct person using two identifiers.  Patient Location: Home  Provider Location: Home Office  I discussed the limitations of evaluation and management by telemedicine. The patient expressed understanding and agreed to proceed.  Vital Signs: Because this visit was a virtual/telehealth visit, some criteria may be missing or patient reported. Any vitals not documented were not able to be obtained and vitals that have been documented are patient reported.         Objective:    Today's Vitals   11/24/23 1447  PainSc: 4    There is no height or weight on file to calculate BMI.     11/24/2023    2:54 PM 11/04/2023    3:57 PM 03/22/2023    7:09 AM 03/17/2023   12:55 PM 10/01/2022    7:59 AM 07/19/2022    9:32 AM 05/25/2022    9:04 AM  Advanced Directives  Does Patient Have a Medical Advance Directive? No No No No Yes No Yes  Type of Advance Directive     Healthcare Power of Attorney    Does patient want to make changes to medical advance directive?       No - Patient declined  Would patient like information on creating a medical advance directive? No - Patient declined No - Patient declined No - Patient declined No - Patient declined  No - Patient declined     Current Medications (verified) Outpatient Encounter Medications as of 11/24/2023  Medication Sig   acetaminophen  (TYLENOL ) 500 MG tablet Take 1 tablet (500 mg total) by mouth every 8 (eight) hours for 10 days.   aspirin EC 325 MG tablet Take 1 tablet (325 mg total) by mouth daily.   atorvastatin  (LIPITOR) 40 MG tablet Take 1 tablet (40 mg total) by mouth daily.   Calcium  Carb-Cholecalciferol (CALCIUM /VITAMIN D) 600-400 MG-UNIT TABS Take 2 tablets by mouth  daily.   ibuprofen  (ADVIL ) 800 MG tablet Take 1 tablet (800 mg total) by mouth every 8 (eight) hours for 10 days. Please take with food, please alternate with acetaminophen    nystatin -triamcinolone  ointment (MYCOLOG) Apply 1 Application topically 2 (two) times daily. Apply to affected areas BID   oxybutynin  (DITROPAN  XL) 5 MG 24 hr tablet Take 1 tablet (5 mg total) by mouth at bedtime.   oxyCODONE  (ROXICODONE ) 5 MG immediate release tablet Take 1 tablet (5 mg total) by mouth every 4 (four) hours as needed for severe pain (pain score 7-10) or breakthrough pain.   pantoprazole  (PROTONIX ) 40 MG tablet Take 1 tablet (40 mg total) by mouth daily.   No facility-administered encounter medications on file as of 11/24/2023.    Allergies (verified) Patient has no known allergies.   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    Varicose vein of leg    Wears glasses    Past Surgical History:  Procedure Laterality Date   ANTERIOR AND POSTERIOR REPAIR WITH SACROSPINOUS FIXATION N/A 07/19/2022   Procedure: POSTERIOR REPAIR  WITHP PERINEORRHAPHY AND  SACROSPINOUS FIXATION;  Surgeon: Arma Lamp, MD;  Location: [facility];  Service: Gynecology;  Laterality: N/A;  Total time requested is 1.5hrs   BREAST CYST EXCISION Left    CHOLECYSTECTOMY     CHOLECYSTECTOMY, LAPAROSCOPIC  2010   COLONOSCOPY WITH PROPOFOL  N/A 03/22/2023   Procedure: COLONOSCOPY WITH PROPOFOL ;  Surgeon: Urban Garden, MD;  Location: [facility];  Service: Gastroenterology;  Laterality: N/A;  8:45AM;ASA 1   TOTAL ABDOMINAL HYSTERECTOMY W/ BILATERAL SALPINGOOPHORECTOMY     1990s   Family History  Problem Relation Age of Onset   Diabetes Mother     Heart attack Brother    Breast cancer Neg Hx    Social History   Socioeconomic History   Marital status: Married    Spouse name: Not on file   Number of children: 2   Years of education: Not on file   Highest education level: Not on file  Occupational History   Not on file  Tobacco Use   Smoking status: Never   Smokeless tobacco: Never  Vaping Use   Vaping status: Never Used  Substance and Sexual Activity   Alcohol use: No   Drug use: Never   Sexual activity: Not Currently    Birth control/protection: Surgical    Comment: hyst  Other Topics Concern   Not on file  Social History Narrative   Not on file   Social Drivers of Health   Financial Resource Strain: Low Risk  (11/24/2023)   Overall Financial Resource Strain (CARDIA)    Difficulty of Paying Living Expenses: Not hard at all  Food Insecurity: No Food Insecurity (11/24/2023)   Hunger Vital Sign    Worried About Running Out of Food in the Last Year: Never true    Ran Out of Food in the Last Year: Never true  Transportation Needs: No Transportation Needs (11/24/2023)   PRAPARE - Administrator, Civil Service (Medical): No    Lack of Transportation (Non-Medical): No  Physical Activity: Inactive (11/24/2023)   Exercise Vital Sign    Days of Exercise per Week: 0 days    Minutes of Exercise per Session: 0 min  Stress: No Stress Concern Present (11/24/2023)   [employer] of Occupational Health - Occupational Stress Questionnaire    Feeling of Stress: Not at all  Social Connections: Moderately Isolated (11/24/2023)   Social Connection and Isolation Panel    Frequency of Communication with Friends and Family: Three times a week    Frequency of Social Gatherings with Friends and Family: More than three times a week    Attends Religious Services: Never    Database administrator or Organizations: No    Attends Engineer, structural: Never    Marital Status: Married    Tobacco Counseling Counseling  given: Not Answered   Clinical Intake:  Pre-visit preparation completed: Yes  Pain : 0-10 Pain Score: 4  Pain Type: Chronic pain Pain Descriptors / Indicators: Burning, Grimacing, Dull Pain Onset: 1 to 4 weeks ago     Diabetes: No  How often do you need to have someone help you when you read instructions, pamphlets, or other written materials from your doctor or pharmacy?: 1 - Never  Interpreter Needed?: No  Information entered by :: Kieth Pelt LPN   Activities of Daily Living    11/24/2023    2:54 PM 03/17/2023   12:57 PM  In your present state of health, do  you have any difficulty performing the following activities:  Hearing? 0   Vision? 0   Difficulty concentrating or making decisions? 0   Walking or climbing stairs? 0   Dressing or bathing? 0   Doing errands, shopping?  0  Preparing Food and eating ? N   Using the Toilet? N   Do you have problems with loss of bowel control? N   Managing your Medications? N   Managing your Finances? N   Housekeeping or managing your Housekeeping? N     Patient Care Team: Austine Lefort, MD as PCP - General (Family Medicine)  Indicate any recent Medical Services you may have received from other than [facility] providers in the past year (date may be approximate).     Assessment:   This is a routine wellness examination for Hania.  Hearing/Vision screen Hearing Screening - Comments:: No trouble hearing Vision Screening - Comments:: Not up date  every three years No place in particular   Goals Addressed             This Visit's Progress    Patient Stated   On track    I would like to continue to walk daily     Patient Stated   On track    Maintain activity level      Patient Stated       Get surgery done  Get back maintain        Depression Screen    11/24/2023    2:51 PM 11/15/2023    9:35 AM 05/30/2023    3:16 PM 04/15/2023    2:05 PM 01/14/2023    9:21 AM 10/07/2022   12:08 PM 05/25/2022    9:05 AM   PHQ 2/9 Scores  PHQ - 2 Score 0 0 0 0 2 0 0  PHQ- 9 Score 0  0  8      Fall Risk    11/24/2023    2:47 PM 11/15/2023    9:35 AM 04/15/2023    2:04 PM 03/01/2023   10:42 AM 01/14/2023    9:21 AM  Fall Risk   Falls in the past year? 0 0 0 0 0  Number falls in past yr: 0 0 0  0  Injury with Fall? 0 0 0  0  Risk for fall due to :  No Fall Risks   No Fall Risks  Follow up Falls evaluation completed;Education provided;Falls prevention discussed Falls evaluation completed Education provided;Falls prevention discussed;Falls evaluation completed  Falls prevention discussed    MEDICARE RISK AT HOME: Medicare Risk at Home Any stairs in or around the home?: Yes If so, are there any without handrails?: No Home free of loose throw rugs in walkways, pet beds, electrical cords, etc?: Yes Adequate lighting in your home to reduce risk of falls?: Yes Life alert?: No Use of a cane, walker or w/c?: No Grab bars in the bathroom?: No Shower chair or bench in shower?: No Elevated toilet seat or a handicapped toilet?: Yes  TIMED UP AND GO:  Was the test performed?  No    Cognitive Function:        11/24/2023    2:52 PM 05/25/2022    9:13 AM  6CIT Screen  What Year? 0 points 0 points  What month? 0 points 0 points  What time? 0 points 0 points  Count back from 20 0 points 0 points  Months in reverse 0 points 0 points  Repeat phrase 0 points  0 points  Total Score 0 points 0 points    Immunizations Immunization History  Administered Date(s) Administered   PFIZER(Purple Top)SARS-COV-2 Vaccination 08/07/2019, 08/28/2019    TDAP status: Due, Education has been provided regarding the importance of this vaccine. Advised may receive this vaccine at local pharmacy or Health Dept. Aware to provide a copy of the vaccination record if obtained from local pharmacy or Health Dept. Verbalized acceptance and understanding.  Flu Vaccine status: Declined, Education has been provided regarding the  importance of this vaccine but patient still declined. Advised may receive this vaccine at local pharmacy or Health Dept. Aware to provide a copy of the vaccination record if obtained from local pharmacy or Health Dept. Verbalized acceptance and understanding.  Pneumococcal vaccine status: Declined,  Education has been provided regarding the importance of this vaccine but patient still declined. Advised may receive this vaccine at local pharmacy or Health Dept. Aware to provide a copy of the vaccination record if obtained from local pharmacy or Health Dept. Verbalized acceptance and understanding.   Covid-19 vaccine status: Declined, Education has been provided regarding the importance of this vaccine but patient still declined. Advised may receive this vaccine at local pharmacy or Health Dept.or vaccine clinic. Aware to provide a copy of the vaccination record if obtained from local pharmacy or Health Dept. Verbalized acceptance and understanding.  Qualifies for Shingles Vaccine? Yes   Zostavax completed No   Shingrix Completed?: No.    Education has been provided regarding the importance of this vaccine. Patient has been advised to call insurance company to determine out of pocket expense if they have not yet received this vaccine. Advised may also receive vaccine at local pharmacy or Health Dept. Verbalized acceptance and understanding.  Screening Tests Health Maintenance  Topic Date Due   COVID-19 Vaccine (3 - Pfizer risk series) 12/01/2023 (Originally 09/25/2019)   Zoster Vaccines- Shingrix (1 of 2) 02/15/2024 (Originally 07/31/1974)   Pneumococcal Vaccine: 50+ Years (1 of 1 - PCV) 11/14/2024 (Originally 08/01/2003)   INFLUENZA VACCINE  01/13/2024   MAMMOGRAM  10/03/2024   Medicare Annual Wellness (AWV)  11/23/2024   Colonoscopy  03/21/2033   DEXA SCAN  Completed   Hepatitis C Screening  Completed   HPV VACCINES  Aged Out   Meningococcal B Vaccine  Aged Out   DTaP/Tdap/Td  Discontinued     Health Maintenance  There are no preventive care reminders to display for this patient.  Colorectal cancer screening: Type of screening: Colonoscopy. Completed 2024. Repeat every 10 years  Mammogram status: Completed  . Repeat every year  Bone Density status: Ordered  . Pt provided with contact info and advised to call to schedule appt.  Lung Cancer Screening: (Low Dose CT Chest recommended if Age 52-80 years, 20 pack-year currently smoking OR have quit w/in 15years.) does not qualify.   Lung Cancer Screening Referral:   Additional Screening:  Hepatitis C Screening: does not qualify; Completed 2018  Vision Screening: Recommended annual ophthalmology exams for early detection of glaucoma and other disorders of the eye. Is the patient up to date with their annual eye exam?  No  Who is the provider or what is the name of the office in which the patient attends annual eye exams? Goes every 3 years  If pt is not established with a provider, would they like to be referred to a provider to establish care? No .   Dental Screening: Recommended annual dental exams for proper oral hygiene    Community  Resource Referral / Chronic Care Management: CRR required this visit?  No   CCM required this visit?  No     Plan:     I have personally reviewed and noted the following in the patient's chart:   Medical and social history Use of alcohol, tobacco or illicit drugs  Current medications and supplements including opioid prescriptions. Patient is currently taking opioid prescriptions. Information provided to patient regarding non-opioid alternatives. Patient advised to discuss non-opioid treatment plan with their provider. Functional ability and status Nutritional status Physical activity Advanced directives List of other physicians Hospitalizations, surgeries, and ER visits in previous 12 months Vitals Screenings to include cognitive, depression, and falls Referrals and  appointments  In addition, I have reviewed and discussed with patient certain preventive protocols, quality metrics, and best practice recommendations. A written personalized care plan for preventive services as well as general preventive health recommendations were provided to patient.     Kieth Pelt, LPN   09/20/8117   After Visit Summary: (MyChart) Due to this being a telephonic visit, the after visit summary with patients personalized plan was offered to patient via MyChart   Nurse Notes:   patient will discuss Bone Density at up coming appointment

## 2023-11-25 ENCOUNTER — Other Ambulatory Visit (HOSPITAL_BASED_OUTPATIENT_CLINIC_OR_DEPARTMENT_OTHER): Payer: Self-pay

## 2023-11-28 ENCOUNTER — Ambulatory Visit (HOSPITAL_BASED_OUTPATIENT_CLINIC_OR_DEPARTMENT_OTHER): Admitting: Orthopaedic Surgery

## 2023-11-29 ENCOUNTER — Other Ambulatory Visit

## 2023-12-02 ENCOUNTER — Ambulatory Visit: Admitting: Family Medicine

## 2023-12-02 ENCOUNTER — Encounter: Payer: Self-pay | Admitting: Family Medicine

## 2023-12-02 ENCOUNTER — Ambulatory Visit (HOSPITAL_BASED_OUTPATIENT_CLINIC_OR_DEPARTMENT_OTHER): Admitting: Physical Therapy

## 2023-12-02 VITALS — BP 130/72 | HR 89 | Temp 98.1°F | Ht 61.0 in | Wt 156.4 lb

## 2023-12-02 DIAGNOSIS — F411 Generalized anxiety disorder: Secondary | ICD-10-CM

## 2023-12-02 DIAGNOSIS — R17 Unspecified jaundice: Secondary | ICD-10-CM

## 2023-12-02 MED ORDER — FLUOXETINE HCL 20 MG PO TABS: 20.0000 mg | ORAL_TABLET | Freq: Every day | ORAL | 3 refills | Status: DC

## 2023-12-02 NOTE — Progress Notes (Signed)
 Subjective:    Patient ID: Taylor Singleton, female    DOB: 1955-10-17, 68 y.o.   MRN: [id number] I saw the patient last fall.  In September, I started the patient on Lexapro  for generalized anxiety disorder.  She stopped the medication after 4 weeks due to nausea.  She cannot recall any benefit.  However the patient states that she is extremely anxious.  She worries about everything per her own report.  She also tends to fly off the handle at her husband.  She is afraid that she is going to say something and truly hurt him.  She denies panic attacks.  However she does report perseverating over worries and fears to a pathologic level.  This keeps her from enjoying life.  She is unable to let things go and relax.  We discussed whether she would benefit from Xanax  however she states that this is a daily phenomenon throughout the day.  Her most recent lab work is listed below and is excellent Office Visit on 11/15/2023  Component Date Value Ref Range Status   Brain Natriuretic Peptide 11/15/2023 12  <100 pg/mL Final   Comment: . BNP levels increase with age in the general population with the highest values seen in individuals greater than 53 years of age. Reference: J. Am. [place] Cons. Cardiol. 2002; 14:782-956. .    WBC 11/15/2023 7.2  3.8 - 10.8 Thousand/uL Final   RBC 11/15/2023 4.48  3.80 - 5.10 Million/uL Final   Hemoglobin 11/15/2023 13.4  11.7 - 15.5 g/dL Final   HCT 21/30/8657 40.7  35.0 - 45.0 % Final   MCV 11/15/2023 90.8  80.0 - 100.0 fL Final   MCH 11/15/2023 29.9  27.0 - 33.0 pg Final   MCHC 11/15/2023 32.9  32.0 - 36.0 g/dL Final   Comment: For adults, a slight decrease in the calculated MCHC value (in the range of 30 to 32 g/dL) is most likely not clinically significant; however, it should be interpreted with caution in correlation with other red cell parameters and the patient's clinical condition.    RDW 11/15/2023 12.4  11.0 - 15.0 % Final   Platelets 11/15/2023 330  140 - 400  Thousand/uL Final   MPV 11/15/2023 10.6  7.5 - 12.5 fL Final   Neutro Abs 11/15/2023 5,076  1,500 - 7,800 cells/uL Final   Absolute Lymphocytes 11/15/2023 1,447  850 - 3,900 cells/uL Final   Absolute Monocytes 11/15/2023 590  200 - 950 cells/uL Final   Eosinophils Absolute 11/15/2023 43  15 - 500 cells/uL Final   Basophils Absolute 11/15/2023 43  0 - 200 cells/uL Final   Neutrophils Relative % 11/15/2023 70.5  % Final   Total Lymphocyte 11/15/2023 20.1  % Final   Monocytes Relative 11/15/2023 8.2  % Final   Eosinophils Relative 11/15/2023 0.6  % Final   Basophils Relative 11/15/2023 0.6  % Final   Glucose, Bld 11/15/2023 90  65 - 99 mg/dL Final   Comment: .            Fasting reference interval .    BUN 11/15/2023 20  7 - 25 mg/dL Final   Creat 84/69/6295 0.66  0.60 - 1.00 mg/dL Final   eGFR 28/41/3244 94  > OR = 60 mL/min/1.5m2 Final   BUN/Creatinine Ratio 11/15/2023 SEE NOTE:  6 - 22 (calc) Final   Comment:    Not Reported: BUN and Creatinine are within    reference range. .    Sodium 11/15/2023 139  135 -  146 mmol/L Final   Potassium 11/15/2023 3.7  3.5 - 5.3 mmol/L Final   Chloride 11/15/2023 103  98 - 110 mmol/L Final   CO2 11/15/2023 29  20 - 32 mmol/L Final   Calcium  11/15/2023 10.0  8.6 - 10.4 mg/dL Final   Total Protein 03/47/4259 6.5  6.1 - 8.1 g/dL Final   Albumin 56/38/7564 4.2  3.6 - 5.1 g/dL Final   Globulin 33/29/5188 2.3  1.9 - 3.7 g/dL (calc) Final   AG Ratio 11/15/2023 1.8  1.0 - 2.5 (calc) Final   Total Bilirubin 11/15/2023 1.4 (H)  0.2 - 1.2 mg/dL Final   Alkaline phosphatase (APISO) 11/15/2023 53  37 - 153 U/L Final   AST 11/15/2023 14  10 - 35 U/L Final   ALT 11/15/2023 15  6 - 29 U/L Final   Cholesterol 11/15/2023 184  <200 mg/dL Final   HDL 41/66/0630 60  > OR = 50 mg/dL Final   Triglycerides 16/06/930 110  <150 mg/dL Final   LDL Cholesterol (Calc) 11/15/2023 103 (H)  mg/dL (calc) Final   Comment: Reference range: <100 . Desirable range <100 mg/dL  for primary prevention;   <70 mg/dL for patients with CHD or diabetic patients  with > or = 2 CHD risk factors. Aaron Aas LDL-C is now calculated using the Martin-Hopkins  calculation, which is a validated novel method providing  better accuracy than the Friedewald equation in the  estimation of LDL-C.  Melinda Sprawls et al. Erroll Heard. 3557;322(02): 2061-2068  (http://education.[facility].com/faq/FAQ164)    Total CHOL/HDL Ratio 11/15/2023 3.1  <5.4 (calc) Final   Non-HDL Cholesterol (Calc) 11/15/2023 124  <130 mg/dL (calc) Final   Comment: For patients with diabetes plus 1 major ASCVD risk  factor, treating to a non-HDL-C goal of <100 mg/dL  (LDL-C of <27 mg/dL) is considered a therapeutic  option.    However the patient is extremely concerned about her bilirubin being elevated.  This is the first time the bilirubin has ever been elevated at 1.4.  As indicative of her anxiety, she focuses on this 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    Varicose vein of leg    Wears glasses    Past Surgical History:  Procedure Laterality Date   ANTERIOR AND POSTERIOR REPAIR WITH SACROSPINOUS FIXATION N/A 07/19/2022   Procedure: POSTERIOR REPAIR WITHP PERINEORRHAPHY AND  SACROSPINOUS FIXATION;  Surgeon: Arma Lamp, MD;  Location: [facility];  Service: Gynecology;  Laterality: N/A;  Total time requested is 1.5hrs   BREAST CYST EXCISION Left    CHOLECYSTECTOMY     CHOLECYSTECTOMY, LAPAROSCOPIC  2010   COLONOSCOPY WITH PROPOFOL  N/A 03/22/2023   Procedure: COLONOSCOPY WITH PROPOFOL ;  Surgeon: Urban Garden, MD;  Location: [facility];  Service: Gastroenterology;  Laterality: N/A;  8:45AM;ASA 1   TOTAL ABDOMINAL HYSTERECTOMY W/  BILATERAL SALPINGOOPHORECTOMY     1990s   Current Outpatient Medications on File Prior to Visit  Medication Sig Dispense Refill   acetaminophen  (TYLENOL ) 500 MG tablet Take 1 tablet (500 mg total) by mouth every 8 (eight) hours for 10 days. 30 tablet 0   aspirin  EC 325 MG tablet Take 1 tablet (325 mg total) by mouth daily. 14 tablet  0   atorvastatin  (LIPITOR) 40 MG tablet Take 1 tablet (40 mg total) by mouth daily. 90 tablet 2   Calcium  Carb-Cholecalciferol (CALCIUM /VITAMIN D) 600-400 MG-UNIT TABS Take 2 tablets by mouth daily.     ibuprofen  (ADVIL ) 800 MG tablet Take 1 tablet (800 mg total) by mouth every 8 (eight) hours for 10 days. Please take with food, please alternate with acetaminophen  30 tablet 0   ketoconazole (NIZORAL) 2 % cream Apply 1 Application topically daily.     nystatin -triamcinolone  ointment (MYCOLOG) Apply 1 Application topically 2 (two) times daily. Apply to affected areas BID 60 g 1   oxybutynin  (DITROPAN  XL) 5 MG 24 hr tablet Take 1 tablet (5 mg total) by mouth at bedtime. 30 tablet 11   oxyCODONE  (ROXICODONE ) 5 MG immediate release tablet Take 1 tablet (5 mg total) by mouth every 4 (four) hours as needed for severe pain (pain score 7-10) or breakthrough pain. 30 tablet 0   pantoprazole  (PROTONIX ) 40 MG tablet Take 1 tablet (40 mg total) by mouth daily. 90 tablet 2   No current facility-administered medications on file prior to visit.   No Known Allergies Social History   Socioeconomic History   Marital status: Married    Spouse name: Not on file   Number of children: 2   Years of education: Not on file   Highest education level: Not on file  Occupational History   Not on file  Tobacco Use   Smoking status: Never   Smokeless tobacco: Never  Vaping Use   Vaping status: Never Used  Substance and Sexual Activity   Alcohol use: No   Drug use: Never   Sexual activity: Not Currently    Birth control/protection: Surgical    Comment: hyst  Other Topics Concern    Not on file  Social History Narrative   Not on file   Social Drivers of Health   Financial Resource Strain: Low Risk  (11/24/2023)   Overall Financial Resource Strain (CARDIA)    Difficulty of Paying Living Expenses: Not hard at all  Food Insecurity: No Food Insecurity (11/24/2023)   Hunger Vital Sign    Worried About Running Out of Food in the Last Year: Never true    Ran Out of Food in the Last Year: Never true  Transportation Needs: No Transportation Needs (11/24/2023)   PRAPARE - Administrator, Civil Service (Medical): No    Lack of Transportation (Non-Medical): No  Physical Activity: Inactive (11/24/2023)   Exercise Vital Sign    Days of Exercise per Week: 0 days    Minutes of Exercise per Session: 0 min  Stress: No Stress Concern Present (11/24/2023)   [employer] of Occupational Health - Occupational Stress Questionnaire    Feeling of Stress: Not at all  Social Connections: Moderately Isolated (11/24/2023)   Social Connection and Isolation Panel    Frequency of Communication with Friends and Family: Three times a week    Frequency of Social Gatherings with Friends and Family: More than three times a week    Attends Religious Services: Never    Database administrator or Organizations: No    Attends Banker Meetings: Never    Marital Status: Married  Catering manager Violence: Not At Risk (11/24/2023)   Humiliation, Afraid, Rape, and Kick questionnaire    Fear of Current or Ex-Partner: No    Emotionally Abused: No    Physically Abused: No    Sexually Abused: No  Review of Systems  All other systems reviewed and are negative.      Objective:   Physical Exam Constitutional:      General: She is not in acute distress.    Appearance: Normal appearance. She is well-developed and normal weight. She is not ill-appearing or toxic-appearing.  HENT:     Right Ear: Tympanic membrane and ear canal normal.     Left Ear: Tympanic membrane and  ear canal normal.     Nose: Nose normal.     Mouth/Throat:     Mouth: Mucous membranes are moist.     Pharynx: Oropharynx is clear. No oropharyngeal exudate or posterior oropharyngeal erythema.   Eyes:     Extraocular Movements: Extraocular movements intact.     Conjunctiva/sclera: Conjunctivae normal.     Pupils: Pupils are equal, round, and reactive to light.    Cardiovascular:     Rate and Rhythm: Normal rate and regular rhythm.     Heart sounds: Normal heart sounds.  Pulmonary:     Effort: Pulmonary effort is normal. No respiratory distress.     Breath sounds: No wheezing or rales.   Musculoskeletal:     Cervical back: Neck supple.  Lymphadenopathy:     Cervical: No cervical adenopathy.   Skin:    Findings: No rash.   Neurological:     Mental Status: She is alert and oriented to person, place, and time.     Cranial Nerves: No cranial nerve deficit, dysarthria or facial asymmetry.     Sensory: No sensory deficit.     Motor: No weakness, tremor, atrophy, abnormal muscle tone or seizure activity.     Coordination: Romberg sign negative. Coordination normal. Finger-Nose-Finger Test normal.     Gait: Gait normal.     Deep Tendon Reflexes: Reflexes normal.   Psychiatric:        Mood and Affect: Mood normal.        Behavior: Behavior normal.        Thought Content: Thought content normal.        Judgment: Judgment normal.           Assessment & Plan:  Elevated bilirubin - Plan: Bilirubin, fractionated(tot/dir/indir)  GAD (generalized anxiety disorder) I believe the patient has generalized anxiety disorder.  She has tried and failed Lexapro .  Will go to try Prozac 20 mg a day and reassess in 6 weeks.  Meanwhile I recommended that she return next week and we check a total bilirubin, indirect bilirubin, and a direct bilirubin.  I suspect that this is likely lab error.  Will proceed with, based on the fractionation, we will workup causes of hyperbilirubinemia.  I tried to  reassure the patient about her normal liver function test as well as her normal alkaline phosphatase.  Her hemoglobin is normal making hemolysis unlikely

## 2023-12-05 ENCOUNTER — Ambulatory Visit (HOSPITAL_BASED_OUTPATIENT_CLINIC_OR_DEPARTMENT_OTHER): Admitting: Physical Therapy

## 2023-12-05 DIAGNOSIS — H2513 Age-related nuclear cataract, bilateral: Secondary | ICD-10-CM | POA: Diagnosis not present

## 2023-12-06 ENCOUNTER — Other Ambulatory Visit

## 2023-12-06 DIAGNOSIS — R17 Unspecified jaundice: Secondary | ICD-10-CM | POA: Diagnosis not present

## 2023-12-06 LAB — BILIRUBIN, FRACTIONATED(TOT/DIR/INDIR)
Bilirubin, Direct: 0.2 mg/dL (ref 0.0–0.2)
Indirect Bilirubin: 0.8 mg/dL (ref 0.2–1.2)
Total Bilirubin: 1 mg/dL (ref 0.2–1.2)

## 2023-12-08 ENCOUNTER — Ambulatory Visit: Payer: Self-pay | Admitting: Family Medicine

## 2023-12-08 DIAGNOSIS — H11823 Conjunctivochalasis, bilateral: Secondary | ICD-10-CM | POA: Diagnosis not present

## 2023-12-08 DIAGNOSIS — H579 Unspecified disorder of eye and adnexa: Secondary | ICD-10-CM | POA: Diagnosis not present

## 2023-12-08 DIAGNOSIS — H2513 Age-related nuclear cataract, bilateral: Secondary | ICD-10-CM | POA: Diagnosis not present

## 2023-12-09 ENCOUNTER — Encounter (HOSPITAL_COMMUNITY): Payer: Self-pay

## 2023-12-09 ENCOUNTER — Emergency Department (HOSPITAL_COMMUNITY)
Admission: EM | Admit: 2023-12-09 | Discharge: 2023-12-09 | Disposition: A | Discharge status: Home / Self Care | Attending: Emergency Medicine | Admitting: Emergency Medicine

## 2023-12-09 ENCOUNTER — Other Ambulatory Visit: Payer: Self-pay

## 2023-12-09 ENCOUNTER — Emergency Department (HOSPITAL_COMMUNITY)

## 2023-12-09 DIAGNOSIS — R519 Headache, unspecified: Secondary | ICD-10-CM | POA: Diagnosis not present

## 2023-12-09 DIAGNOSIS — I10 Essential (primary) hypertension: Secondary | ICD-10-CM | POA: Insufficient documentation

## 2023-12-09 DIAGNOSIS — N3000 Acute cystitis without hematuria: Secondary | ICD-10-CM | POA: Diagnosis not present

## 2023-12-09 LAB — COMPREHENSIVE METABOLIC PANEL WITH GFR
ALT: 27 U/L (ref 0–44)
AST: 22 U/L (ref 15–41)
Albumin: 4.2 g/dL (ref 3.5–5.0)
Alkaline Phosphatase: 53 U/L (ref 38–126)
Anion gap: 9 (ref 5–15)
BUN: 21 mg/dL (ref 8–23)
CO2: 24 mmol/L (ref 22–32)
Calcium: 9.7 mg/dL (ref 8.9–10.3)
Chloride: 103 mmol/L (ref 98–111)
Creatinine, Ser: 0.38 mg/dL — ABNORMAL LOW (ref 0.44–1.00)
GFR, Estimated: >60 mL/min (ref >60)
Glucose, Bld: 92 mg/dL (ref 70–99)
Potassium: 3.8 mmol/L (ref 3.5–5.1)
Sodium: 136 mmol/L (ref 135–145)
Total Bilirubin: 1.2 mg/dL (ref 0.0–1.2)
Total Protein: 7.1 g/dL (ref 6.5–8.1)

## 2023-12-09 LAB — CBC
HCT: 41.9 % (ref 36.0–46.0)
Hemoglobin: 13.9 g/dL (ref 12.0–15.0)
MCH: 30 pg (ref 26.0–34.0)
MCHC: 33.2 g/dL (ref 30.0–36.0)
MCV: 90.3 fL (ref 80.0–100.0)
Platelets: 327 10*3/uL (ref 150–400)
RBC: 4.64 MIL/uL (ref 3.87–5.11)
RDW: 13 % (ref 11.5–15.5)
WBC: 6.8 10*3/uL (ref 4.0–10.5)
nRBC: 0 % (ref 0.0–0.2)

## 2023-12-09 LAB — URINALYSIS, ROUTINE W REFLEX MICROSCOPIC
Bilirubin Urine: NEGATIVE
Glucose, UA: NEGATIVE mg/dL
Hgb urine dipstick: NEGATIVE
Ketones, ur: NEGATIVE mg/dL
Nitrite: POSITIVE — AB
Protein, ur: NEGATIVE mg/dL
Specific Gravity, Urine: 1.015 (ref 1.005–1.030)
pH: 7 (ref 5.0–8.0)

## 2023-12-09 LAB — LIPASE, BLOOD: Lipase: 36 U/L (ref 11–51)

## 2023-12-09 MED ORDER — ONDANSETRON 4 MG PO TBDP
4.0000 mg | ORAL_TABLET | Freq: Once | ORAL | Status: AC | PRN
  Administered 2023-12-09: 4 mg via ORAL
  Filled 2023-12-09: qty 1

## 2023-12-09 MED ORDER — CEPHALEXIN 500 MG PO CAPS: 500.0000 mg | ORAL_CAPSULE | Freq: Two times a day (BID) | ORAL | 0 refills | Status: AC

## 2023-12-09 NOTE — ED Triage Notes (Signed)
 Patient presented to ER with headache and eye pressure. Patient has been having nausea and pressure behind her eyes since Monday. Patient went and saw an eye doctor who said her eyes had no issues.

## 2023-12-09 NOTE — Discharge Instructions (Addendum)
You have been seen in the Emergency Department (ED) for a headache.  Please use Tylenol as needed for symptoms, but only as written on the box.  As we have discussed, please follow up with your primary care doctor as soon as possible regarding today's Emergency Department (ED) visit and your headache symptoms.    Call your doctor or return to the ED if you have a worsening headache, sudden and severe headache, confusion, slurred speech, facial droop, weakness or numbness in any arm or leg, extreme fatigue, vision problems, or other symptoms that concern you.  

## 2023-12-09 NOTE — ED Provider Notes (Signed)
 Emergency Department Provider Note   I have reviewed the triage vital signs and the nursing notes.   HISTORY  Chief Complaint Nausea and Headache   HPI Taylor Singleton is a 68 y.o. female hypertension, hyperlipidemia presents to the emergency department for evaluation of headaches.  She been having pressure sensation behind both eyes without vision change.  This been going on now for several days.  No clear provoking factors.  No sudden onset, maximal intensity headache symptoms.  Patient went to her eye doctor 2 days ago and had dilated exam along with intraocular pressures taken in both eyes which were normal.  She continues to have the heavy sensation behind the eyes.  No fevers.  No headaches like this in the past.  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    Varicose vein of leg    Wears glasses     Review of Systems  Constitutional: No fever/chills Cardiovascular: Denies chest pain. Respiratory: Denies shortness of breath. Gastrointestinal: No abdominal pain. Positive nausea.  Skin: Negative for rash. Neurological: Negative for focal weakness or numbness. Positive HA.    ____________________________________________   PHYSICAL EXAM:  VITAL SIGNS: ED Triage Vitals  Encounter Vitals Group     BP 12/09/23 1239 118/73     Pulse Rate 12/09/23 1239 78     Resp 12/09/23 1239 20     Temp 12/09/23 1239 98.6 F (37 C)     Temp Source 12/09/23 1239 Oral     SpO2 12/09/23 1239 97 %     Weight 12/09/23 1256 156 lb 8.4 oz (71 kg)     Height 12/09/23 1256 5' 1 (1.549 m)   Constitutional: Alert and oriented. Well appearing and in no acute distress. Eyes: Conjunctivae are normal. PERRL. EOMI. Head: Atraumatic. Nose: No  congestion/rhinnorhea. Mouth/Throat: Mucous membranes are moist.  Neck: No stridor.  No meningeal signs.  Cardiovascular: Good peripheral circulation.  Respiratory: Normal respiratory effort.  Gastrointestinal: No distention.  Musculoskeletal: No lower extremity tenderness nor edema. No gross deformities of extremities. Neurologic:  Normal speech and language. No gross focal neurologic deficits are appreciated.  Normal finger-to-nose testing.  No facial asymmetry.  5/5 strength in the bilateral upper extremities. Skin:  Skin is warm, dry and intact. No rash noted.  ____________________________________________   LABS (all labs ordered are listed, but only abnormal results are displayed)  Labs Reviewed  COMPREHENSIVE METABOLIC PANEL WITH GFR - Abnormal; Notable for the following components:      Result Value   Creatinine, Ser 0.38 (*)    All other components within normal limits  URINALYSIS, ROUTINE W REFLEX MICROSCOPIC - Abnormal; Notable for the following components:   APPearance HAZY (*)    Nitrite POSITIVE (*)    Leukocytes,Ua SMALL (*)    Bacteria, UA RARE (*)    All other components within normal limits  URINE CULTURE  LIPASE, BLOOD  CBC  __________________________________  RADIOLOGY  CT Head Wo Contrast Result Date: 12/09/2023 CLINICAL DATA:  Headache, new onset. EXAM: CT HEAD WITHOUT CONTRAST TECHNIQUE: Contiguous axial images were obtained from the base of the skull through the vertex without intravenous contrast. RADIATION DOSE REDUCTION: This exam was performed according to the departmental dose-optimization program  which includes automated exposure control, adjustment of the mA and/or kV according to patient size and/or use of iterative reconstruction technique. COMPARISON:  MRI head 12/13/2021. FINDINGS: Brain: No acute intracranial hemorrhage. No CT evidence of acute infarct. No edema, mass effect, or midline shift. The basilar cisterns are patent. Ventricles: The  ventricles are normal. Vascular: No hyperdense vessel or unexpected calcification. Skull: No acute or aggressive finding. Orbits: Orbits are symmetric. Sinuses: The visualized paranasal sinuses are clear. Other: Mastoid air cells are clear. IMPRESSION: No CT evidence of acute intracranial abnormality. Electronically Signed   By: Donnice Mania M.D.   On: 12/09/2023 16:30    ____________________________________________   PROCEDURES  Procedure(s) performed:   Procedures  None  ____________________________________________   INITIAL IMPRESSION / ASSESSMENT AND PLAN / ED COURSE  Pertinent labs & imaging results that were available during my care of the patient were reviewed by me and considered in my medical decision making (see chart for details).   This patient is Presenting for Evaluation of HA, which does require a range of treatment options, and is a complaint that involves a high risk of morbidity and mortality.  The Differential Diagnoses includes but is not exclusive to subarachnoid hemorrhage, meningitis, encephalitis, previous head trauma, cavernous venous thrombosis, muscle tension headache, glaucoma, temporal arteritis, migraine or migraine equivalent, etc.   Critical Interventions-    Medications  ondansetron  (ZOFRAN -ODT) disintegrating tablet 4 mg (4 mg Oral Given 12/09/23 1329)    Reassessment after intervention: nausea improved.   Clinical Laboratory Tests Ordered, included UA with bacteria and nitrite positive.  No UTI.  No urinary symptoms. Will send for culture.   Radiologic Tests Ordered, included CT head. I independently interpreted the images and agree with radiology interpretation.   Cardiac Monitor Tracing which shows NSR.    Social Determinants of Health Risk patient is a non-smoker.    Medical Decision Making: Summary:  Patient presents to the ED with pressure behind eyes and headache.  No sudden onset, maximal intensity headaches.  No fevers.  Given age  and new onset headache symptoms plan for CT head.  Reevaluation with update and discussion with patient. CT head unremarkable. Plan for PCP follow up and Neurology referral.   Patient's presentation is most consistent with acute presentation with potential threat to life or bodily function.   Disposition: discharge  ____________________________________________  FINAL CLINICAL IMPRESSION(S) / ED DIAGNOSES  Final diagnoses:  Acute nonintractable headache, unspecified headache type  Acute cystitis without hematuria     NEW OUTPATIENT MEDICATIONS STARTED DURING THIS VISIT:  Discharge Medication List as of 12/09/2023  4:39 PM     START taking these medications   Details  cephALEXin  (KEFLEX ) 500 MG capsule Take 1 capsule (500 mg total) by mouth 2 (two) times daily for 7 days., Starting Fri 12/09/2023, Until Fri 12/16/2023, Normal        Note:  This document was prepared using Dragon voice recognition software and may include unintentional dictation errors.  Fonda Law, MD, [facility] Emergency [facility]    Jesten Cappuccio, Fonda MATSU, MD 12/09/23 [phone]

## 2023-12-11 LAB — URINE CULTURE: Culture: >=100000 — AB

## 2023-12-12 ENCOUNTER — Telehealth (HOSPITAL_BASED_OUTPATIENT_CLINIC_OR_DEPARTMENT_OTHER): Payer: Self-pay | Admitting: *Deleted

## 2023-12-12 DIAGNOSIS — R030 Elevated blood-pressure reading, without diagnosis of hypertension: Secondary | ICD-10-CM | POA: Diagnosis not present

## 2023-12-12 DIAGNOSIS — R110 Nausea: Secondary | ICD-10-CM | POA: Diagnosis not present

## 2023-12-12 NOTE — Telephone Encounter (Signed)
 Post ED Visit - Positive Culture Follow-up  Culture report reviewed by antimicrobial stewardship pharmacist: [facility] []  [address] Pharm.D. []  Venetia Gully, Pharm.D., BCPS AQ-ID []  Garrel Crews, Pharm.D., BCPS []  Almarie Lunger, Pharm.D., BCPS []  [place], [address].D., BCPS, AAHIVP []  Rosaline Bihari, Pharm.D., BCPS, AAHIVP []  Vernell Meier, PharmD, BCPS []  Latanya Hint, PharmD, BCPS []  Donald Medley, PharmD, BCPS []  Rocky Bold, PharmD []  Dorothyann Alert, PharmD, BCPS []  Morene Babe, PharmD  [facility] []  Rosaline Edison, PharmD []  Romona Bliss, PharmD []  Dolphus Roller, PharmD []  Veva Seip, Rph []  Vernell Daunt) Leonce, PharmD []  Eva Allis, PharmD []  Rosaline Millet, PharmD []  Iantha Batch, PharmD []  Arvin Gauss, PharmD []  Wanda Hasting, PharmD []  Ronal Rav, PharmD []  Rocky Slade, PharmD [x]  Almarie Lunger, PharmD   Positive urine culture Treated with Cephalexin , organism sensitive to the same and no further patient follow-up is required at this time.  Taylor Singleton 12/12/2023, 9:48 AM

## 2023-12-12 NOTE — Progress Notes (Signed)
 ED Antimicrobial Stewardship Positive Culture Follow Up   Taylor Singleton is an 68 y.o. female who presented to [facility] on 12/09/2023 with a chief complaint of  Chief Complaint  Patient presents with   Nausea   Headache    Recent Results (from the past 720 hours)  Urine Culture     Status: Abnormal   Collection Time: 12/09/23  3:41 PM   Specimen: Urine, Clean Catch  Result Value Ref Range Status   Specimen Description   Final    URINE, CLEAN CATCH Performed at [facility], [address], [place], [place] [address]    Special Requests   Final    NONE Performed at [facility], [address], [place], [place] [address]    Culture >=100,000 COLONIES/mL ESCHERICHIA COLI (A)  Final   Report Status 12/11/2023 FINAL  Final   Organism ID, Bacteria ESCHERICHIA COLI (A)  Final      Susceptibility   Escherichia coli - MIC*    AMPICILLIN <=2 SENSITIVE Sensitive     CEFAZOLIN  <=4 SENSITIVE Sensitive     CEFEPIME <=0.12 SENSITIVE Sensitive     CEFTRIAXONE <=0.25 SENSITIVE Sensitive     CIPROFLOXACIN <=0.25 SENSITIVE Sensitive     GENTAMICIN <=1 SENSITIVE Sensitive     IMIPENEM <=0.25 SENSITIVE Sensitive     NITROFURANTOIN <=16 SENSITIVE Sensitive     TRIMETH/SULFA <=20 SENSITIVE Sensitive     AMPICILLIN/SULBACTAM <=2 SENSITIVE Sensitive     PIP/TAZO <=4 SENSITIVE Sensitive ug/mL    * >=100,000 COLONIES/mL ESCHERICHIA COLI    The patient presented with pressure behind eyes and headache. Head CT negative. ED-MD note specifically states no UTI, no urinary symptoms however appears treatment triggered off of a UA.  Discussed with ED provider about calling the patient to stop antibiotics however provider wanted to continue since sensitive.  Continue Keflex  as prescribed per Dr. Lenor   ED Provider: Braulio Lenor  Thank you for allowing pharmacy to be a part of this patient's care.  Taylor Singleton, PharmD, BCPS, BCIDP Infectious Diseases  Clinical Pharmacist 12/12/2023 9:42 AM   **Pharmacist phone directory can now be found on [email or web address] ([email or web address]).  Listed under [facility].

## 2023-12-13 ENCOUNTER — Emergency Department (HOSPITAL_COMMUNITY)
Admission: EM | Admit: 2023-12-13 | Discharge: 2023-12-13 | Disposition: A | Discharge status: Home / Self Care | Attending: Emergency Medicine | Admitting: Emergency Medicine

## 2023-12-13 ENCOUNTER — Encounter (HOSPITAL_COMMUNITY): Payer: Self-pay

## 2023-12-13 ENCOUNTER — Other Ambulatory Visit: Payer: Self-pay

## 2023-12-13 ENCOUNTER — Emergency Department (HOSPITAL_COMMUNITY)

## 2023-12-13 DIAGNOSIS — R350 Frequency of micturition: Secondary | ICD-10-CM | POA: Diagnosis not present

## 2023-12-13 DIAGNOSIS — F419 Anxiety disorder, unspecified: Secondary | ICD-10-CM

## 2023-12-13 DIAGNOSIS — R110 Nausea: Secondary | ICD-10-CM | POA: Diagnosis not present

## 2023-12-13 DIAGNOSIS — K56609 Unspecified intestinal obstruction, unspecified as to partial versus complete obstruction: Secondary | ICD-10-CM | POA: Diagnosis not present

## 2023-12-13 DIAGNOSIS — Z7982 Long term (current) use of aspirin: Secondary | ICD-10-CM | POA: Diagnosis not present

## 2023-12-13 DIAGNOSIS — K219 Gastro-esophageal reflux disease without esophagitis: Secondary | ICD-10-CM | POA: Insufficient documentation

## 2023-12-13 DIAGNOSIS — R1013 Epigastric pain: Secondary | ICD-10-CM | POA: Diagnosis not present

## 2023-12-13 DIAGNOSIS — F410 Panic disorder [episodic paroxysmal anxiety] without agoraphobia: Secondary | ICD-10-CM | POA: Diagnosis not present

## 2023-12-13 DIAGNOSIS — R109 Unspecified abdominal pain: Secondary | ICD-10-CM | POA: Diagnosis not present

## 2023-12-13 DIAGNOSIS — K5730 Diverticulosis of large intestine without perforation or abscess without bleeding: Secondary | ICD-10-CM | POA: Diagnosis not present

## 2023-12-13 DIAGNOSIS — K5900 Constipation, unspecified: Secondary | ICD-10-CM | POA: Insufficient documentation

## 2023-12-13 LAB — URINALYSIS, W/ REFLEX TO CULTURE (INFECTION SUSPECTED)
Bacteria, UA: NONE SEEN
Bilirubin Urine: NEGATIVE
Glucose, UA: NEGATIVE mg/dL
Hgb urine dipstick: NEGATIVE
Ketones, ur: NEGATIVE mg/dL
Leukocytes,Ua: NEGATIVE
Nitrite: NEGATIVE
Protein, ur: NEGATIVE mg/dL
Specific Gravity, Urine: 1.021 (ref 1.005–1.030)
pH: 8 (ref 5.0–8.0)

## 2023-12-13 LAB — COMPREHENSIVE METABOLIC PANEL WITH GFR
ALT: 20 U/L (ref 0–44)
AST: 17 U/L (ref 15–41)
Albumin: 3.9 g/dL (ref 3.5–5.0)
Alkaline Phosphatase: 49 U/L (ref 38–126)
Anion gap: 7 (ref 5–15)
BUN: 20 mg/dL (ref 8–23)
CO2: 27 mmol/L (ref 22–32)
Calcium: 9.4 mg/dL (ref 8.9–10.3)
Chloride: 99 mmol/L (ref 98–111)
Creatinine, Ser: 0.67 mg/dL (ref 0.44–1.00)
GFR, Estimated: >60 mL/min (ref >60)
Glucose, Bld: 108 mg/dL — ABNORMAL HIGH (ref 70–99)
Potassium: 3.8 mmol/L (ref 3.5–5.1)
Sodium: 133 mmol/L — ABNORMAL LOW (ref 135–145)
Total Bilirubin: 1.1 mg/dL (ref 0.0–1.2)
Total Protein: 6.5 g/dL (ref 6.5–8.1)

## 2023-12-13 LAB — CBC WITH DIFFERENTIAL/PLATELET
Abs Immature Granulocytes: 0.04 10*3/uL (ref 0.00–0.07)
Basophils Absolute: 0 10*3/uL (ref 0.0–0.1)
Basophils Relative: 1 %
Eosinophils Absolute: 0 10*3/uL (ref 0.0–0.5)
Eosinophils Relative: 0 %
HCT: 40.2 % (ref 36.0–46.0)
Hemoglobin: 13.5 g/dL (ref 12.0–15.0)
Immature Granulocytes: 1 %
Lymphocytes Relative: 15 %
Lymphs Abs: 1.3 10*3/uL (ref 0.7–4.0)
MCH: 30.4 pg (ref 26.0–34.0)
MCHC: 33.6 g/dL (ref 30.0–36.0)
MCV: 90.5 fL (ref 80.0–100.0)
Monocytes Absolute: 0.7 10*3/uL (ref 0.1–1.0)
Monocytes Relative: 8 %
Neutro Abs: 6.3 10*3/uL (ref 1.7–7.7)
Neutrophils Relative %: 75 %
Platelets: 312 10*3/uL (ref 150–400)
RBC: 4.44 MIL/uL (ref 3.87–5.11)
RDW: 12.9 % (ref 11.5–15.5)
WBC: 8.3 10*3/uL (ref 4.0–10.5)
nRBC: 0 % (ref 0.0–0.2)

## 2023-12-13 LAB — LIPASE, BLOOD: Lipase: 37 U/L (ref 11–51)

## 2023-12-13 LAB — TSH: TSH: 0.363 u[IU]/mL (ref 0.350–4.500)

## 2023-12-13 LAB — LACTIC ACID, PLASMA: Lactic Acid, Venous: 1.1 mmol/L (ref 0.5–1.9)

## 2023-12-13 LAB — MAGNESIUM: Magnesium: 2 mg/dL (ref 1.7–2.4)

## 2023-12-13 MED ORDER — SODIUM CHLORIDE 0.9 % IV BOLUS
500.0000 mL | Freq: Once | INTRAVENOUS | Status: AC
  Administered 2023-12-13: 500 mL via INTRAVENOUS

## 2023-12-13 MED ORDER — LORAZEPAM 1 MG PO TABS: 1.0000 mg | ORAL_TABLET | Freq: Three times a day (TID) | ORAL | 0 refills | Status: DC | PRN

## 2023-12-13 MED ORDER — LORAZEPAM 1 MG PO TABS
1.0000 mg | ORAL_TABLET | Freq: Once | ORAL | Status: AC
  Administered 2023-12-13: 1 mg via ORAL
  Filled 2023-12-13: qty 1

## 2023-12-13 MED ORDER — SODIUM CHLORIDE (PF) 0.9 % IJ SOLN
INTRAMUSCULAR | Status: AC
  Filled 2023-12-13: qty 50

## 2023-12-13 MED ORDER — IOHEXOL 300 MG/ML  SOLN
100.0000 mL | Freq: Once | INTRAMUSCULAR | Status: AC | PRN
  Administered 2023-12-13: 100 mL via INTRAVENOUS

## 2023-12-13 NOTE — ED Notes (Signed)
 Pt declined last set vs

## 2023-12-13 NOTE — ED Notes (Signed)
 Patient is resting comfortably.

## 2023-12-13 NOTE — Discharge Instructions (Signed)
 Your history, exam, workup today did not reveal evidence of bowel obstruction like you were sent here to rule out but did show some constipation.  The rest of your labs are overall reassuring and I do suspect you are having some anxiety and panic attacks leading to a lot of your presenting symptoms.  As you are feeling better now, we did review to give you a short course of some Ativan as a rescue medicine but we do recommend follow-up with your PCP tomorrow and your team to discuss ongoing anxiety and chronic management.  Please rest and stay hydrated.  If any symptoms change or worsen acutely, please return to the nearest emergency department.

## 2023-12-13 NOTE — ED Triage Notes (Signed)
 Pt arrived reporting nervousness and nausea. Endorses recently started anxiety meds and MD advised would feel this way. Patient concerned still and wants to be evaluated

## 2023-12-13 NOTE — ED Provider Notes (Signed)
 Taylor Singleton Provider Note   CSN: [id number] Arrival date & time: 12/13/23  9068     Patient presents with: No chief complaint on file.   Taylor Singleton is a 68 y.o. female.   The history is provided by the patient and medical records. No language interpreter was used.  Abdominal Cramping This is a new problem. The current episode started 2 days ago. Associated symptoms include abdominal pain. Pertinent negatives include no chest pain, no headaches and no shortness of breath. Nothing aggravates the symptoms. Nothing relieves the symptoms. She has tried nothing for the symptoms. The treatment provided no relief.       Prior to Admission medications   Medication Sig Start Date End Date Taking? Authorizing Provider  aspirin  EC 325 MG tablet Take 1 tablet (325 mg total) by mouth daily. 11/23/23   Genelle Standing, MD  atorvastatin  (LIPITOR) 40 MG tablet Take 1 tablet (40 mg total) by mouth daily. 06/17/23   Duanne Butler DASEN, MD  Calcium  Carb-Cholecalciferol (CALCIUM /VITAMIN D) 600-400 MG-UNIT TABS Take 2 tablets by mouth daily.    [provider]  cephALEXin  (KEFLEX ) 500 MG capsule Take 1 capsule (500 mg total) by mouth 2 (two) times daily for 7 days. 12/09/23 12/16/23  LongFonda MATSU, MD  FLUoxetine  (PROZAC ) 20 MG tablet Take 1 tablet (20 mg total) by mouth daily. 12/02/23   Duanne Butler DASEN, MD  ketoconazole (NIZORAL) 2 % cream Apply 1 Application topically daily. 11/18/23   [provider]  nystatin -triamcinolone  ointment (MYCOLOG) Apply 1 Application topically 2 (two) times daily. Apply to affected areas BID 08/26/23   Aletha Bene, MD  oxybutynin  (DITROPAN  XL) 5 MG 24 hr tablet Take 1 tablet (5 mg total) by mouth at bedtime. 09/13/23   Ozan, Jennifer, DO  oxyCODONE  (ROXICODONE ) 5 MG immediate release tablet Take 1 tablet (5 mg total) by mouth every 4 (four) hours as needed for severe pain (pain score 7-10) or breakthrough pain.  11/23/23   Genelle Standing, MD  pantoprazole  (PROTONIX ) 40 MG tablet Take 1 tablet (40 mg total) by mouth daily. 06/17/23   Duanne Butler DASEN, MD    Allergies: Patient has no known allergies.    Review of Systems  Constitutional:  Positive for appetite change. Negative for chills, fatigue and fever.  HENT:  Negative for congestion.   Eyes:  Negative for visual disturbance.  Respiratory:  Negative for cough, chest tightness, shortness of breath and wheezing.   Cardiovascular:  Negative for chest pain, palpitations and leg swelling.  Gastrointestinal:  Positive for abdominal pain, constipation and nausea. Negative for diarrhea and vomiting.  Genitourinary:  Positive for frequency. Negative for dysuria.  Musculoskeletal:  Negative for back pain, neck pain and neck stiffness.  Skin:  Negative for rash.  Neurological:  Negative for speech difficulty, weakness, light-headedness, numbness and headaches.  Psychiatric/Behavioral:  Negative for agitation. The patient is nervous/anxious.   All other systems reviewed and are negative.   Updated Vital Signs BP (!) 115/91 (BP Location: Left Arm)   Pulse 89   Temp 98.3 F (36.8 C) (Oral)   Resp 18   SpO2 100%   Physical Exam Vitals and nursing note reviewed.  Constitutional:      General: She is not in acute distress.    Appearance: She is well-developed. She is not ill-appearing, toxic-appearing or diaphoretic.  HENT:     Head: Normocephalic and atraumatic.     Nose: No congestion or rhinorrhea.  Mouth/Throat:     Mouth: Mucous membranes are dry.     Pharynx: No oropharyngeal exudate or posterior oropharyngeal erythema.   Eyes:     Extraocular Movements: Extraocular movements intact.     Conjunctiva/sclera: Conjunctivae normal.     Pupils: Pupils are equal, round, and reactive to light.    Cardiovascular:     Rate and Rhythm: Normal rate and regular rhythm.     Heart sounds: No murmur heard. Pulmonary:     Effort: Pulmonary  effort is normal. No respiratory distress.     Breath sounds: Normal breath sounds. No wheezing, rhonchi or rales.  Chest:     Chest wall: No tenderness.  Abdominal:     General: Abdomen is flat.     Palpations: Abdomen is soft.     Tenderness: There is no abdominal tenderness. There is no right CVA tenderness, left CVA tenderness, guarding or rebound.   Musculoskeletal:        General: No swelling or tenderness.     Cervical back: Neck supple.     Right lower leg: No edema.     Left lower leg: No edema.   Skin:    General: Skin is warm and dry.     Capillary Refill: Capillary refill takes less than 2 seconds.     Findings: No erythema or rash.   Neurological:     General: No focal deficit present.     Mental Status: She is alert.   Psychiatric:        Mood and Affect: Mood is anxious.     (all labs ordered are listed, but only abnormal results are displayed) Labs Reviewed  COMPREHENSIVE METABOLIC PANEL WITH GFR - Abnormal; Notable for the following components:      Result Value   Sodium 133 (*)    Glucose, Bld 108 (*)    All other components within normal limits  URINALYSIS, W/ REFLEX TO CULTURE (INFECTION SUSPECTED) - Abnormal; Notable for the following components:   Color, Urine COLORLESS (*)    All other components within normal limits  CBC WITH DIFFERENTIAL/PLATELET  LACTIC ACID, PLASMA  LIPASE, BLOOD  TSH  MAGNESIUM  LACTIC ACID, PLASMA    EKG: None  Radiology: CT ABDOMEN PELVIS W CONTRAST Result Date: 12/13/2023 CLINICAL DATA:  Bowel obstruction suspected acted. Sent for CT to evaluate for bowel obstruction. Recent nausea, abdominal discomfort, decreased bowel movement. Previous abdominal surgery. Patient reports nervousness and nausea. Endorses recently starting anxiety medications. EXAM: CT ABDOMEN AND PELVIS WITH CONTRAST TECHNIQUE: Multidetector CT imaging of the abdomen and pelvis was performed using the standard protocol following bolus administration  of intravenous contrast. RADIATION DOSE REDUCTION: This exam was performed according to the departmental dose-optimization program which includes automated exposure control, adjustment of the mA and/or kV according to patient size and/or use of iterative reconstruction technique. CONTRAST:  OMNIPAQUE IOHEXOL 300 MG/ML  SOLN COMPARISON:  MRI left hip 08/30/2023 FINDINGS: Lower chest: Mild curvilinear subsegmental atelectasis and/or scarring within the bilateral lower lobes. Heart size is mildly mildly enlarged. No inferior pericardial effusion. Hepatobiliary: Smooth liver contours. Mildly decreased density throughout the liver suggesting mild fatty infiltration. The gallbladder is not visualized and presumably is surgically absent. No intrahepatic or extrahepatic biliary ductal dilatation. No focal liver lesion is seen. Pancreas: Unremarkable. No pancreatic ductal dilatation or surrounding inflammatory changes. Spleen: Normal in size without focal abnormality. Adrenals/Urinary Tract: Normal bilateral adrenals. The kidneys enhance uniformly are symmetric in size without hydronephrosis. No focal urinary  bladder wall thickening. The ureters are normal in caliber. Stomach/Bowel: Moderate to high-grade stool is seen throughout the colon. There are moderate diverticula seen at the proximal posterior aspect of the ascending colon. The terminal ileum is unremarkable. A blind-ending tubular structure favored to represent the normal appendix is seen on coronal series 8 images 40 through 61. No dilated loops of bowel are seen to indicate bowel obstruction. No bowel wall thickening is seen. Vascular/Lymphatic: No abdominal aortic aneurysm. The major intra-abdominal aortic branch vessels are patent. Mild-to-moderate atherosclerotic calcifications. No mesenteric, retroperitoneal, or pelvic pathologically enlarged lymph nodes by CT criteria. Reproductive: The uterus is surgically absent. No adnexal mass is seen. Other: Small  fat containing supraumbilical ventral abdominal hernia with the orifice measuring up to 12 x 11 mm (transverse by craniocaudal, axial series 2, image 43 and sagittal series 10, image 91). No free air or free fluid is seen within the abdomen or pelvis. Musculoskeletal: Mild dextrocurvature centered at L1. Severe left T12-L1 discs space narrowing and endplate degenerative changes. Minimal retrolisthesis of T12 on L1. IMPRESSION: 1. No acute abnormality is seen within the abdomen or pelvis. No evidence of bowel obstruction. 2. Moderate to high-grade stool is seen throughout the colon, compatible with constipation. 3. Moderate diverticulosis of the posterior proximal ascending colon without evidence of acute diverticulitis. 4. Small fat containing supraumbilical ventral abdominal hernia. 5. Mild fatty infiltration of the liver. Electronically Signed   By: Tanda Lyons M.D.   On: 12/13/2023 12:20     Procedures   Medications Ordered in the ED  LORazepam (ATIVAN) tablet 1 mg (1 mg Oral Given 12/13/23 1035)  sodium chloride  0.9 % bolus 500 mL (0 mLs Intravenous Stopped 12/13/23 1131)  iohexol (OMNIPAQUE) 300 MG/ML solution 100 mL (100 mLs Intravenous Contrast Given 12/13/23 1142)                                    Medical Decision Making Amount and/or Complexity of Data Reviewed Labs: ordered. Radiology: ordered.  Risk Prescription drug management.    Terre Hanneman is a 68 y.o. female with a past medical history significant for vertigo, hyperlipidemia, GERD, previous vaginal prolapse status post surgery, previous cholecystectomy, recent diagnosis of UTI on antibiotics and patient report of recent diagnosis of anxiety who presents with nervousness, intermittent abdominal pain, nausea, decreased bowel movement, urinary frequency, and jitteriness.  According to patient, she has been seen multiple times recently for the symptoms of nervousness and anxiousness.  She says that her mind is racing and will not  stop.  She was started on anxiety medicine and she is worried it is not helping.  She was seen in several days ago for headache and had a CT head and labs that were reassuring.  She was told she had a urinary tract infection and has been on the antibiotics.  She is still having some frequency.  She reports she is having abdominal pain on and off for the last day or 2 and went to urgent care last night and was told she needs to come to the emergency department for CT scan to rule out bowel obstruction given the nausea, previous abdominal surgery, and decreased bowel movements.  She denies any hematuria.  Denies any diarrhea.  Denies trauma.  Denies any chest pain shortness of breath or palpitations.  Denies any current headache or neck pain.  On exam, lungs clear and chest nontender.  Abdomen  was not focally tender for me and I did hear bowel sounds.  Back and flanks nontender.  Patient resting but is anxious appearing.  We had a shared decision-making conversation about management.  We agreed to get a CT of the pelvis as she was sent to rule that out and worsen her recent bowel changes.  Will also get repeat urinalysis but I suspect this is still urinary tract infection.  Will get some basic labs and as she has decreased oral intake with the nausea we will give her some fluids with dry mouth on exam.  Anticipate reassessment after workup to determine disposition.  1:57 PM Workup returned overall reassuring.  CT scan shows no evidence of bowel obstruction and only showed some constipation.  With all the findings together.  CBC and CMP reassuring but urinalysis also is improved compared to last time.  Given her well appearance and improvement in symptoms after the Ativan I feel she is safe for discharge home.  She will follow-up with her PCP tomorrow and understood return precautions and follow-up instructions.  Will give a short course of Ativan as a rescue medicine to use with her other medications.  She  agrees to plan of care and had no other questions or concerns.  She will increase her hydration.  She will take MiraLAX at home.  Patient discharged in good condition with improved symptoms.      Final diagnoses:  Anxiety  Panic attack  Constipation, unspecified constipation type  Abdominal pain, unspecified abdominal location    ED Discharge Orders          Ordered    LORazepam (ATIVAN) 1 MG tablet  3 times daily PRN        12/13/23 1356            Clinical Impression: 1. Anxiety   2. Panic attack   3. Constipation, unspecified constipation type   4. Abdominal pain, unspecified abdominal location     Disposition: Discharge  Condition: Good  I have discussed the results, Dx and Tx plan with the pt(& family if present). He/she/they expressed understanding and agree(s) with the plan. Discharge instructions discussed at great length. Strict return precautions discussed and pt &/or family have verbalized understanding of the instructions. No further questions at time of discharge.    New Prescriptions   LORAZEPAM (ATIVAN) 1 MG TABLET    Take 1 tablet (1 mg total) by mouth 3 (three) times daily as needed for anxiety.    Follow Up: Duanne Butler DASEN, MD [address] [place] [address] [phone]     [facility] Emergency Department at [facility] [address] [place]  [address] [phone]       Oluchi Pucci, Lonni PARAS, MD 12/13/23 [phone]

## 2023-12-14 ENCOUNTER — Ambulatory Visit (INDEPENDENT_AMBULATORY_CARE_PROVIDER_SITE_OTHER): Admitting: Family Medicine

## 2023-12-14 ENCOUNTER — Ambulatory Visit: Admitting: Obstetrics & Gynecology

## 2023-12-14 ENCOUNTER — Encounter: Payer: Self-pay | Admitting: Family Medicine

## 2023-12-14 VITALS — BP 124/82 | HR 84 | Temp 98.8°F | Ht 61.0 in | Wt 156.0 lb

## 2023-12-14 DIAGNOSIS — F419 Anxiety disorder, unspecified: Secondary | ICD-10-CM | POA: Diagnosis not present

## 2023-12-14 DIAGNOSIS — K5900 Constipation, unspecified: Secondary | ICD-10-CM

## 2023-12-14 DIAGNOSIS — R110 Nausea: Secondary | ICD-10-CM | POA: Diagnosis not present

## 2023-12-14 DIAGNOSIS — R519 Headache, unspecified: Secondary | ICD-10-CM

## 2023-12-14 MED ORDER — ONDANSETRON 4 MG PO TBDP: 4.0000 mg | ORAL_TABLET | Freq: Three times a day (TID) | ORAL | 0 refills | Status: DC | PRN

## 2023-12-14 MED ORDER — FLUOXETINE HCL 10 MG PO CAPS: 10.0000 mg | ORAL_CAPSULE | Freq: Every day | ORAL | 1 refills | Status: DC

## 2023-12-14 NOTE — Progress Notes (Unsigned)
 Patient Office Visit  Assessment & Plan:  Anxiety -     FLUoxetine  HCl; Take 1 capsule (10 mg total) by mouth daily.  Dispense: 90 capsule; Refill: 1  Constipation, unspecified constipation type  Acute nonintractable headache, unspecified headache type  Nausea -     Ondansetron ; Take 1 tablet (4 mg total) by mouth every 8 (eight) hours as needed for nausea or vomiting.  Dispense: 30 tablet; Refill: 0   Assessment and Plan    Anxiety Anxiety worsened by life events and travel plans. Fluoxetine  caused nausea, a common side effect. Lorazepam effective for acute anxiety but may cause drowsiness. Discussed fluoxetine  dose reduction and Zofran  for nausea. Ativan as emergency option due to tolerance risk. - Reduce fluoxetine  to 10 mg daily. - Prescribe Zofran  4 mg for nausea as needed. - Advise half-dose lorazepam for acute anxiety during travel or stress. - Encourage deep breathing and non-pharmacological techniques.  Constipation CT scan confirmed moderate to high-grade stool burden. Miralax used but bowel movement difficulty persists. Emphasized regularity to prevent obstruction, especially post-rectal prolapse surgery. Discussed dietary fiber intake. - Continue daily Miralax. - Increase dietary fiber with fruits, vegetables, and possibly Metamucil. - Provide dietary fiber information.  Goals of Care Concerns about health and anxiety management with travel. Reassured by normal CT findings. Emphasized managing anxiety and constipation for well-being. - Reassure about normal CT findings and absence of serious conditions. - Discuss importance of managing anxiety and constipation for quality of life.  Follow-up Travel planned, return on 30th. Follow-up needed post-medication adjustment. - Schedule follow-up 4-6 weeks after medication adjustment.        Test results were reviewed and analyzed as part of the medical decision making of this visit.  Reviewed ER notes, diagnostic  studies during the office visit. Fiber info given  Return in about 4 weeks (around 01/11/2024), or if symptoms worsen or fail to improve.   Subjective:    Patient ID: Taylor Singleton, female    DOB: 05/20/1956  Age: 68 y.o. MRN: [id number]  Chief Complaint  Patient presents with   Follow-up    Follow up from ED for several different issues.     HPI Discussed the use of AI scribe software for clinical note transcription with the patient, who gave verbal consent to proceed.  History of Present Illness        Taylor Singleton is a 68 year old female who presents with persistent pressure behind her eyes and nausea, anxiety and constipation. Patient is also here for ER follow up.   She has been experiencing persistent pressure behind her eyes, severe enough to cause fatigue and a sensation of heaviness. Initial evaluation at [facility] showed no abnormalities, and further consultation with an ophthalmologist in [place] also did not reveal any issues. A hospital visit on June 27th included blood work and a urine specimen, which identified a bacterial infection in her urine. A CT scan of her head showed no abnormalities, while a CT scan of her abdomen revealed diverticulosis, fatty liver, and significant stool accumulation. Despite using Miralax, she continues to have difficulty with bowel movements. patient has not tried Metamucil yet  She recently started taking Prozac  20mg  for anxiety, which has led to significant nausea and reduced appetite. She feels nauseous and has been unable to eat much. She has not yet started taking lorazepam, which was prescribed for anxiety and can help with nausea. 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  She has been attempting to manage her anxiety with deep breathing exercises and by reducing stressors, such as limiting her use of Facebook.  She is scheduled for surgery on August 12th for a total tear of her left  gluteus minimus. She has a history of stage three rectal prolapse surgery and has been advised to take Miralax daily to prevent constipation. She experiences gas and bloating, which she attributes to irregular bowel movements.  She plans to travel to [place]  on July 23rd and return on July 30th, expressing concern about managing her symptoms during this trip. 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 She reports feeling tired and having pressure behind her eyes. Physical Exam Results LABS Urine culture: Bacteria present (12/09/2023)  RADIOLOGY Head CT: Normal (12/09/2023) Abdominal CT: Diverticulosis, hepatic steatosis, moderate to high-grade stool, no obstruction (12/10/2023) Gluteus minimus MRI: Complete tear of gluteus minimus Assessment & Plan Anxiety Anxiety worsened by life events and travel plans. Fluoxetine  caused nausea, a common side effect. Lorazepam effective for acute anxiety but may cause drowsiness. Discussed fluoxetine  dose reduction and Zofran  for nausea. Ativan as emergency option due to tolerance risk. - Reduce fluoxetine  to 10 mg daily. - Prescribe Zofran  4 mg for nausea as needed. - Advise half-dose lorazepam for acute anxiety during travel or stress. - Encourage deep breathing and non-pharmacological techniques.  Constipation CT scan confirmed moderate to high-grade stool burden. Miralax used but bowel movement difficulty persists. Emphasized regularity to prevent obstruction, especially post-rectal prolapse surgery. Discussed dietary fiber intake. - Continue daily Miralax. - Increase dietary fiber with fruits, vegetables, and possibly Metamucil. - Provide dietary fiber information.  Goals of Care Concerns about health and anxiety management with travel. Reassured by normal CT findings. Emphasized managing anxiety and constipation for well-being. - Reassure about normal CT findings and absence of  serious conditions. - Discuss importance of managing anxiety and constipation for quality of life. Follow-up Travel planned, return on 30th. Follow-up needed post-medication adjustment. - Schedule follow-up 4-6 weeks after medication adjustment.   The 10-year ASCVD risk score (Arnett DK, et al., 2019) is: 9.5%  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    Varicose vein of leg    Wears glasses    Past Surgical History:  Procedure Laterality Date   ANTERIOR AND POSTERIOR REPAIR WITH SACROSPINOUS FIXATION N/A 07/19/2022   Procedure: POSTERIOR REPAIR WITHP PERINEORRHAPHY AND  SACROSPINOUS FIXATION;  Surgeon: Marilynne Rosaline SAILOR, MD;  Location: [facility];  Service: Gynecology;  Laterality: N/A;  Total time requested is 1.5hrs   BREAST CYST EXCISION Left    CHOLECYSTECTOMY     CHOLECYSTECTOMY, LAPAROSCOPIC  2010   COLONOSCOPY WITH PROPOFOL  N/A 03/22/2023   Procedure: COLONOSCOPY WITH PROPOFOL ;  Surgeon: Eartha Angelia Sieving, MD;  Location: [facility];  Service: Gastroenterology;  Laterality: N/A;  8:45AM;ASA 1   TOTAL ABDOMINAL HYSTERECTOMY W/ BILATERAL SALPINGOOPHORECTOMY     1990s   Social History   Tobacco Use   Smoking status: Never   Smokeless tobacco: Never  Vaping Use   Vaping status: Never Used  Substance Use Topics   Alcohol use: No   Drug use: Never  Family History  Problem Relation Age of Onset   Diabetes Mother    Heart attack Brother    Breast cancer Neg Hx    No Known Allergies  ROS    Objective:    BP 124/82   Pulse 84   Temp 98.8 F (37.1 C)   Ht 5' 1 (1.549 m)   Wt 156 lb (70.8 kg)   SpO2 97%   BMI 29.48 kg/m  BP Readings from Last 3 Encounters:  12/15/23 131/67  12/14/23  124/82  12/13/23 133/73   Wt Readings from Last 3 Encounters:  12/15/23 157 lb (71.2 kg)  12/14/23 156 lb (70.8 kg)  12/09/23 156 lb 8.4 oz (71 kg)    Physical Exam Vitals and nursing note reviewed.  Constitutional:      Appearance: Normal appearance.  HENT:     Head: Normocephalic.     Right Ear: Tympanic membrane, ear canal and external ear normal.     Left Ear: Tympanic membrane, ear canal and external ear normal.  Eyes:     Extraocular Movements: Extraocular movements intact.     Pupils: Pupils are equal, round, and reactive to light.  Cardiovascular:     Rate and Rhythm: Normal rate and regular rhythm.     Heart sounds: Normal heart sounds.  Pulmonary:     Effort: Pulmonary effort is normal.     Breath sounds: Normal breath sounds.  Abdominal:     General: Bowel sounds are normal.     Tenderness: There is no abdominal tenderness. There is no guarding or rebound.  Musculoskeletal:     Right lower leg: No edema.     Left lower leg: No edema.  Neurological:     General: No focal deficit present.     Mental Status: She is alert and oriented to person, place, and time.  Psychiatric:        Mood and Affect: Mood normal.        Behavior: Behavior normal.        Thought Content: Thought content normal.        Judgment: Judgment normal.      No results found for any visits on 12/14/23.

## 2023-12-15 ENCOUNTER — Telehealth: Payer: Self-pay | Admitting: Neurology

## 2023-12-15 ENCOUNTER — Ambulatory Visit: Admitting: Neurology

## 2023-12-15 ENCOUNTER — Encounter: Payer: Self-pay | Admitting: Family Medicine

## 2023-12-15 VITALS — BP 131/67 | HR 81 | Ht 61.0 in | Wt 157.0 lb

## 2023-12-15 DIAGNOSIS — H5713 Ocular pain, bilateral: Secondary | ICD-10-CM

## 2023-12-15 DIAGNOSIS — H579 Unspecified disorder of eye and adnexa: Secondary | ICD-10-CM

## 2023-12-15 DIAGNOSIS — R42 Dizziness and giddiness: Secondary | ICD-10-CM | POA: Diagnosis not present

## 2023-12-15 DIAGNOSIS — R519 Headache, unspecified: Secondary | ICD-10-CM

## 2023-12-15 NOTE — Progress Notes (Signed)
 [facility]    Provider:  Dr Ines Requesting Provider: Long, Fonda MATSU, MD Primary Care Provider:  Duanne Butler DASEN, MD  CC:  Pressure in head  HPI:  Taylor Singleton is a 68 y.o. female here as requested by Long, Fonda MATSU, MD for headaches. has HLD (hyperlipidemia); BPPV (benign paroxysmal positional vertigo), left; Laryngopharyngeal reflux (LPR); Pharyngoesophageal dysphagia; S/P hysterectomy; Urinary frequency; Vaginal burning; Vaginal irritation; Vulvar irritation; Sebaceous cyst of labia; Nausea without vomiting; Flatulence; Encounter for colorectal cancer screening; Greater trochanteric pain syndrome; Acute cystitis without hematuria; Candidiasis, intertrigo; and Seborrheic keratoses on their problem list.  Pt alone, rm 4, All of sudden developed pressure in head 6/22, eye MD(ophthalmology) 6/23 and 6/26 ophthalmologist and there was nothing found. She had pressure like her eyes were really heavy and she just wanted to close them, she thought it was sinus, sinus medication did not help, she tried flonase but she has never had allergies, no ocngestion or drainage, from temple to temple more pressure not throbbing, no new light sensitivity, no sound or smell sensitivity, vision was fine, no facial weakness, no tenderness to palpation, no tightness in the jws or in the shoulders, bilateral not unilateral,  Went to hospital. When she was having this feeling eyes were so tired felt like she could barely hold them open.CT of head c/o and negative. Couple days later had c/o of nausea and went back to ER CT abd was negative. She states as far as pressure goes that is better but still has a heaviness sensation in eyes. The ophthalmologist couldn't find anything. They did imaging and nothing was found, she felt nauseated, she stopped spending hours on her ipad. She is much improved, her eyes still feel tired. She doesn't always sleep well but normally get up, not excessively tired during  the day. She has had vertigo in the past resolved with Epley Maneuvers. She may think there is an anxietyand stress component she is on prozac  for a little over a week. Still feel a lingering eye heaviness. She works for [employer]. B the time she got home her eyes were tired when she looks at a screen.  She has a hx of migraines.No other focal neurologic deficits, associated symptoms, inciting events or modifiable factors..  Reviewed notes, labs and imaging from outside physicians, which showed:  From a thorough review of records and patient report, Medications tried that can be used in migraine/headache management greater than 3 months include: Lifestyle modification, headache diaries, better sleep hygiene, exercise, management of migraine triggers, OTC and prescribed analgesics/nsaids such as ibuprofen , excedrin, alleve and others, lexapro , prozac ,   Dr. Darra 12/09/2023: Patient went to her eye doctor 2 days ago and had dilated exam along with intraocular pressures taken in both eyes which were normal.  She continues to have the heavy sensation behind the eyes.   12/09/2023: personally reviewed images and agree with the following EXAM: CT HEAD WITHOUT CONTRAST   TECHNIQUE: Contiguous axial images were obtained from the base of the skull through the vertex without intravenous contrast.   RADIATION DOSE REDUCTION: This exam was performed according to the departmental dose-optimization program which includes automated exposure control, adjustment of the mA and/or kV according to patient size and/or use of iterative reconstruction technique.   COMPARISON:  MRI head 12/13/2021.   FINDINGS: Brain: No acute intracranial hemorrhage. No CT evidence of acute infarct. No edema, mass effect, or midline shift. The basilar cisterns are patent.   Ventricles: The ventricles are  normal.   Vascular: No hyperdense vessel or unexpected calcification.   Skull: No acute or aggressive finding.    Orbits: Orbits are symmetric.   Sinuses: The visualized paranasal sinuses are clear.   Other: Mastoid air cells are clear.   IMPRESSION: No CT evidence of acute intracranial abnormality.  MRI brain 12/14/2021: EXAM: MRI HEAD WITHOUT AND WITH CONTRAST   TECHNIQUE: Multiplanar, multiecho pulse sequences of the brain and surrounding structures were obtained without and with intravenous contrast.   CONTRAST:  18mL MULTIHANCE  GADOBENATE DIMEGLUMINE  529 MG/ML IV SOLN   COMPARISON:  No pertinent prior exams available for comparison.   FINDINGS: Brain:   A routine protocol brain MRI without and with contrast was ordered and performed.   No age advanced or lobar predominant parenchymal atrophy.   Multifocal T2 FLAIR hyperintense signal abnormality within the cerebral white matter, nonspecific but compatible with mild chronic small vessel ischemic disease.   There is no acute infarct.   No evidence of an intracranial mass.   No chronic intracranial blood products.   No extra-axial fluid collection.   No midline shift.   No pathologic intracranial enhancement identified.   Vascular: Maintained flow voids within the proximal large arterial vessels.   Skull and upper cervical spine: No focal suspicious marrow lesion.   Sinuses/Orbits: No mass or acute finding within the imaged orbits. No significant paranasal sinus disease.   IMPRESSION: 1. No evidence of acute intracranial abnormality. 2. Mild chronic small vessel ischemic changes within the cerebral white matter. 3. Otherwise unremarkable MRI appearance of the brain for age.  Reviewed labs: significant for low sodium 133 12/16/2023 with elevated bilirubin 1.3, esr/crp nml, 12/09/2023 + ecoli > 100k colonies urine with +urinaysis, otherwise unremarkable Recent Results (from the past 2160 hours)  Brain natriuretic peptide     Status: None   Collection Time: 11/15/23  9:53 AM  Result Value Ref Range   Brain Natriuretic  Peptide 12 <100 pg/mL    Comment: . BNP levels increase with age in the general population with the highest values seen in individuals greater than 9 years of age. Reference: J. Am. Penne. Cardiol. 2002; 59:023-017. SABRA   CBC with Differential/Platelet     Status: None   Collection Time: 11/15/23  9:53 AM  Result Value Ref Range   WBC 7.2 3.8 - 10.8 Thousand/uL   RBC 4.48 3.80 - 5.10 Million/uL   Hemoglobin 13.4 11.7 - 15.5 g/dL   HCT 59.2 64.9 - 54.9 %   MCV 90.8 80.0 - 100.0 fL   MCH 29.9 27.0 - 33.0 pg   MCHC 32.9 32.0 - 36.0 g/dL    Comment: For adults, a slight decrease in the calculated MCHC value (in the range of 30 to 32 g/dL) is most likely not clinically significant; however, it should be interpreted with caution in correlation with other red cell parameters and the patient's clinical condition.    RDW 12.4 11.0 - 15.0 %   Platelets 330 140 - 400 Thousand/uL   MPV 10.6 7.5 - 12.5 fL   Neutro Abs 5,076 1,500 - 7,800 cells/uL   Absolute Lymphocytes 1,447 850 - 3,900 cells/uL   Absolute Monocytes 590 200 - 950 cells/uL   Eosinophils Absolute 43 15 - 500 cells/uL   Basophils Absolute 43 0 - 200 cells/uL   Neutrophils Relative % 70.5 %   Total Lymphocyte 20.1 %   Monocytes Relative 8.2 %   Eosinophils Relative 0.6 %   Basophils Relative 0.6 %  Comprehensive metabolic panel with GFR     Status: Abnormal   Collection Time: 11/15/23  9:53 AM  Result Value Ref Range   Glucose, Bld 90 65 - 99 mg/dL    Comment: .            Fasting reference interval .    BUN 20 7 - 25 mg/dL   Creat 9.33 9.39 - 8.99 mg/dL   eGFR 94 > OR = 60 fO/fpw/8.26f7   BUN/Creatinine Ratio SEE NOTE: 6 - 22 (calc)    Comment:    Not Reported: BUN and Creatinine are within    reference range. .    Sodium 139 135 - 146 mmol/L   Potassium 3.7 3.5 - 5.3 mmol/L   Chloride 103 98 - 110 mmol/L   CO2 29 20 - 32 mmol/L   Calcium  10.0 8.6 - 10.4 mg/dL   Total Protein 6.5 6.1 - 8.1 g/dL   Albumin 4.2  3.6 - 5.1 g/dL   Globulin 2.3 1.9 - 3.7 g/dL (calc)   AG Ratio 1.8 1.0 - 2.5 (calc)   Total Bilirubin 1.4 (H) 0.2 - 1.2 mg/dL   Alkaline phosphatase (APISO) 53 37 - 153 U/L   AST 14 10 - 35 U/L   ALT 15 6 - 29 U/L  Lipid panel     Status: Abnormal   Collection Time: 11/15/23  9:53 AM  Result Value Ref Range   Cholesterol 184 <200 mg/dL   HDL 60 > OR = 50 mg/dL   Triglycerides 889 <849 mg/dL   LDL Cholesterol (Calc) 103 (H) mg/dL (calc)    Comment: Reference range: <100 . Desirable range <100 mg/dL for primary prevention;   <70 mg/dL for patients with CHD or diabetic patients  with > or = 2 CHD risk factors. SABRA LDL-C is now calculated using the Martin-Hopkins  calculation, which is a validated novel method providing  better accuracy than the Friedewald equation in the  estimation of LDL-C.  Gladis APPLETHWAITE et al. SANDREA. 7986;689(80): 2061-2068  (http://education.[facility].com/faq/FAQ164)    Total CHOL/HDL Ratio 3.1 <5.0 (calc)   Non-HDL Cholesterol (Calc) 124 <130 mg/dL (calc)    Comment: For patients with diabetes plus 1 major ASCVD risk  factor, treating to a non-HDL-C goal of <100 mg/dL  (LDL-C of <29 mg/dL) is considered a therapeutic  option.   Bilirubin, fractionated(tot/dir/indir)     Status: None   Collection Time: 12/06/23  9:34 AM  Result Value Ref Range   Total Bilirubin 1.0 0.2 - 1.2 mg/dL   Bilirubin, Direct 0.2 0.0 - 0.2 mg/dL   Indirect Bilirubin 0.8 0.2 - 1.2 mg/dL (calc)  Lipase, blood     Status: None   Collection Time: 12/09/23  1:29 PM  Result Value Ref Range   Lipase 36 11 - 51 U/L    Comment: Performed at [facility], [address] [place], [place] [address]  Comprehensive metabolic panel     Status: Abnormal   Collection Time: 12/09/23  1:29 PM  Result Value Ref Range   Sodium 136 135 - 145 mmol/L   Potassium 3.8 3.5 - 5.1 mmol/L   Chloride 103 98 - 111 mmol/L   CO2 24 22 - 32 mmol/L   Glucose, Bld 92 70 - 99 mg/dL     Comment: Glucose reference range applies only to samples taken after fasting for at least 8 hours.   BUN 21 8 - 23 mg/dL   Creatinine, Ser 9.61 (L) 0.44 - 1.00  mg/dL   Calcium  9.7 8.9 - 10.3 mg/dL   Total Protein 7.1 6.5 - 8.1 g/dL   Albumin 4.2 3.5 - 5.0 g/dL   AST 22 15 - 41 U/L   ALT 27 0 - 44 U/L   Alkaline Phosphatase 53 38 - 126 U/L   Total Bilirubin 1.2 0.0 - 1.2 mg/dL   GFR, Estimated >39 >39 mL/min    Comment: (NOTE) Calculated using the CKD-EPI Creatinine Equation (2021)    Anion gap 9 5 - 15    Comment: Performed at [facility], [address], [place], [place] [address]  CBC     Status: None   Collection Time: 12/09/23  1:29 PM  Result Value Ref Range   WBC 6.8 4.0 - 10.5 K/uL   RBC 4.64 3.87 - 5.11 MIL/uL   Hemoglobin 13.9 12.0 - 15.0 g/dL   HCT 58.0 63.9 - 53.9 %   MCV 90.3 80.0 - 100.0 fL   MCH 30.0 26.0 - 34.0 pg   MCHC 33.2 30.0 - 36.0 g/dL   RDW 86.9 88.4 - 84.4 %   Platelets 327 150 - 400 K/uL   nRBC 0.0 0.0 - 0.2 %    Comment: Performed at [facility], [address], [place], [place] [address]  Urinalysis, Routine w reflex microscopic -Urine, Clean Catch     Status: Abnormal   Collection Time: 12/09/23  1:29 PM  Result Value Ref Range   Color, Urine YELLOW YELLOW   APPearance HAZY (A) CLEAR   Specific Gravity, Urine 1.015 1.005 - 1.030   pH 7.0 5.0 - 8.0   Glucose, UA NEGATIVE NEGATIVE mg/dL   Hgb urine dipstick NEGATIVE NEGATIVE   Bilirubin Urine NEGATIVE NEGATIVE   Ketones, ur NEGATIVE NEGATIVE mg/dL   Protein, ur NEGATIVE NEGATIVE mg/dL   Nitrite POSITIVE (A) NEGATIVE   Leukocytes,Ua SMALL (A) NEGATIVE   RBC / HPF 0-5 0 - 5 RBC/hpf   WBC, UA 21-50 0 - 5 WBC/hpf   Bacteria, UA RARE (A) NONE SEEN   Squamous Epithelial / HPF 6-10 0 - 5 /HPF   Mucus PRESENT    Hyaline Casts, UA PRESENT     Comment: Performed at [facility], [address], [place], [place] [address]  Urine Culture      Status: Abnormal   Collection Time: 12/09/23  3:41 PM   Specimen: Urine, Clean Catch  Result Value Ref Range   Specimen Description      URINE, CLEAN CATCH Performed at [facility], [address], [place], [place] [address]    Special Requests      NONE Performed at [facility], [address] [place], [place] [address]    Culture >=100,000 COLONIES/mL ESCHERICHIA COLI (A)    Report Status 12/11/2023 FINAL    Organism ID, Bacteria ESCHERICHIA COLI (A)       Susceptibility   Escherichia coli - MIC*    AMPICILLIN <=2 SENSITIVE Sensitive     CEFAZOLIN  <=4 SENSITIVE Sensitive     CEFEPIME <=0.12 SENSITIVE Sensitive     CEFTRIAXONE <=0.25 SENSITIVE Sensitive     CIPROFLOXACIN <=0.25 SENSITIVE Sensitive     GENTAMICIN <=1 SENSITIVE Sensitive     IMIPENEM <=0.25 SENSITIVE Sensitive     NITROFURANTOIN <=16 SENSITIVE Sensitive     TRIMETH/SULFA <=20 SENSITIVE Sensitive     AMPICILLIN/SULBACTAM <=2 SENSITIVE Sensitive     PIP/TAZO <=4 SENSITIVE Sensitive ug/mL    * >=  100,000 COLONIES/mL ESCHERICHIA COLI  CBC with Differential     Status: None   Collection Time: 12/13/23 10:16 AM  Result Value Ref Range   WBC 8.3 4.0 - 10.5 K/uL   RBC 4.44 3.87 - 5.11 MIL/uL   Hemoglobin 13.5 12.0 - 15.0 g/dL   HCT 59.7 63.9 - 53.9 %   MCV 90.5 80.0 - 100.0 fL   MCH 30.4 26.0 - 34.0 pg   MCHC 33.6 30.0 - 36.0 g/dL   RDW 87.0 88.4 - 84.4 %   Platelets 312 150 - 400 K/uL   nRBC 0.0 0.0 - 0.2 %   Neutrophils Relative % 75 %   Neutro Abs 6.3 1.7 - 7.7 K/uL   Lymphocytes Relative 15 %   Lymphs Abs 1.3 0.7 - 4.0 K/uL   Monocytes Relative 8 %   Monocytes Absolute 0.7 0.1 - 1.0 K/uL   Eosinophils Relative 0 %   Eosinophils Absolute 0.0 0.0 - 0.5 K/uL   Basophils Relative 1 %   Basophils Absolute 0.0 0.0 - 0.1 K/uL   Immature Granulocytes 1 %   Abs Immature Granulocytes 0.04 0.00 - 0.07 K/uL    Comment: Performed at [facility], [address], [place], [place] [address]  Comprehensive metabolic panel     Status: Abnormal   Collection Time: 12/13/23 10:16 AM  Result Value Ref Range   Sodium 133 (L) 135 - 145 mmol/L   Potassium 3.8 3.5 - 5.1 mmol/L   Chloride 99 98 - 111 mmol/L   CO2 27 22 - 32 mmol/L   Glucose, Bld 108 (H) 70 - 99 mg/dL    Comment: Glucose reference range applies only to samples taken after fasting for at least 8 hours.   BUN 20 8 - 23 mg/dL   Creatinine, Ser 9.32 0.44 - 1.00 mg/dL   Calcium  9.4 8.9 - 10.3 mg/dL   Total Protein 6.5 6.5 - 8.1 g/dL   Albumin 3.9 3.5 - 5.0 g/dL   AST 17 15 - 41 U/L   ALT 20 0 - 44 U/L   Alkaline Phosphatase 49 38 - 126 U/L   Total Bilirubin 1.1 0.0 - 1.2 mg/dL   GFR, Estimated >39 >39 mL/min    Comment: (NOTE) Calculated using the CKD-EPI Creatinine Equation (2021)    Anion gap 7 5 - 15    Comment: Performed at [facility], [address], [place], [place] [address]  Lactic acid, plasma     Status: None   Collection Time: 12/13/23 10:16 AM  Result Value Ref Range   Lactic Acid, Venous 1.1 0.5 - 1.9 mmol/L    Comment: Performed at [facility], [address] [place], [place] [address]  Lipase, blood     Status: None   Collection Time: 12/13/23 10:16 AM  Result Value Ref Range   Lipase 37 11 - 51 U/L    Comment: Performed at [facility], [address] [place], [place] [address]  TSH     Status: None   Collection Time: 12/13/23 10:16 AM  Result Value Ref Range   TSH 0.363 0.350 - 4.500 uIU/mL    Comment: Performed by a 3rd Generation assay with a functional sensitivity of <=0.01 uIU/mL. Performed at [facility], [address], [place], [place] [address]   Magnesium     Status: None   Collection Time: 12/13/23 10:16 AM  Result Value Ref Range   Magnesium 2.0 1.7 -  2.4 mg/dL    Comment: Performed at [facility], [address], [place], [place] [address]   Urinalysis, w/ Reflex to Culture (Infection Suspected) -Urine, Clean Catch     Status: Abnormal   Collection Time: 12/13/23 12:04 PM  Result Value Ref Range   Specimen Source URINE, CLEAN CATCH    Color, Urine COLORLESS (A) YELLOW   APPearance CLEAR CLEAR   Specific Gravity, Urine 1.021 1.005 - 1.030   pH 8.0 5.0 - 8.0   Glucose, UA NEGATIVE NEGATIVE mg/dL   Hgb urine dipstick NEGATIVE NEGATIVE   Bilirubin Urine NEGATIVE NEGATIVE   Ketones, ur NEGATIVE NEGATIVE mg/dL   Protein, ur NEGATIVE NEGATIVE mg/dL   Nitrite NEGATIVE NEGATIVE   Leukocytes,Ua NEGATIVE NEGATIVE   RBC / HPF 0-5 0 - 5 RBC/hpf   WBC, UA 0-5 0 - 5 WBC/hpf    Comment:        Reflex urine culture not performed if WBC <=10, OR if Squamous epithelial cells >5. If Squamous epithelial cells >5 suggest recollection.    Bacteria, UA NONE SEEN NONE SEEN   Squamous Epithelial / HPF 0-5 0 - 5 /HPF   Mucus PRESENT     Comment: Performed at [facility], [address] [place], [place] [address]  Sedimentation rate     Status: None   Collection Time: 12/15/23  8:50 AM  Result Value Ref Range   Sed Rate 2 0 - 40 mm/hr  C-reactive protein     Status: None   Collection Time: 12/15/23  8:50 AM  Result Value Ref Range   CRP <1 0 - 10 mg/L  Comprehensive metabolic panel     Status: Abnormal   Collection Time: 12/16/23 11:23 AM  Result Value Ref Range   Sodium 133 (L) 135 - 145 mmol/L   Potassium 3.7 3.5 - 5.1 mmol/L   Chloride 99 98 - 111 mmol/L   CO2 24 22 - 32 mmol/L   Glucose, Bld 108 (H) 70 - 99 mg/dL    Comment: Glucose reference range applies only to samples taken after fasting for at least 8 hours.   BUN 14 8 - 23 mg/dL   Creatinine, Ser 9.27 0.44 - 1.00 mg/dL   Calcium  9.5 8.9 - 10.3 mg/dL   Total Protein 6.8 6.5 - 8.1 g/dL   Albumin 4.1 3.5 - 5.0 g/dL   AST 19 15 - 41 U/L   ALT 20 0 - 44 U/L   Alkaline Phosphatase 55 38 - 126 U/L   Total Bilirubin 1.3 (H) 0.0 - 1.2 mg/dL   GFR,  Estimated >39 >39 mL/min    Comment: (NOTE) Calculated using the CKD-EPI Creatinine Equation (2021)    Anion gap 10 5 - 15    Comment: Performed at [facility], [address] [place], [place] [address]  CBC     Status: None   Collection Time: 12/16/23 11:23 AM  Result Value Ref Range   WBC 6.8 4.0 - 10.5 K/uL   RBC 4.51 3.87 - 5.11 MIL/uL   Hemoglobin 13.7 12.0 - 15.0 g/dL   HCT 59.2 63.9 - 53.9 %   MCV 90.2 80.0 - 100.0 fL   MCH 30.4 26.0 - 34.0 pg   MCHC 33.7 30.0 - 36.0 g/dL   RDW 87.1 88.4 - 84.4 %   Platelets 343 150 - 400 K/uL   nRBC 0.0 0.0 - 0.2 %    Comment: Performed at [facility],  [address] [place], [place] [address]  POC occult blood, ED     Status: None   Collection Time: 12/16/23  2:11 PM  Result Value Ref Range   Fecal Occult Bld NEGATIVE NEGATIVE     Review of Systems: Patient complains of symptoms per HPI as well as the following symptoms per hpi. Pertinent negatives and positives per HPI. All others negative.   Social History   Socioeconomic History   Marital status: Married    Spouse name: Not on file   Number of children: 2   Years of education: Not on file   Highest education level: Not on file  Occupational History   Not on file  Tobacco Use   Smoking status: Never   Smokeless tobacco: Never  Vaping Use   Vaping status: Never Used  Substance and Sexual Activity   Alcohol use: No   Drug use: Never   Sexual activity: Not Currently    Birth control/protection: Surgical    Comment: hyst  Other Topics Concern   Not on file  Social History Narrative   Not on file   Social Drivers of Health   Financial Resource Strain: Low Risk  (11/24/2023)   Overall Financial Resource Strain (CARDIA)    Difficulty of Paying Living Expenses: Not hard at all  Food Insecurity: No Food Insecurity (11/24/2023)   Hunger Vital Sign    Worried About Running Out of Food in the Last Year: Never true    Ran Out of  Food in the Last Year: Never true  Transportation Needs: No Transportation Needs (11/24/2023)   PRAPARE - Administrator, Civil Service (Medical): No    Lack of Transportation (Non-Medical): No  Physical Activity: Inactive (11/24/2023)   Exercise Vital Sign    Days of Exercise per Week: 0 days    Minutes of Exercise per Session: 0 min  Stress: No Stress Concern Present (11/24/2023)   [employer] of Occupational Health - Occupational Stress Questionnaire    Feeling of Stress: Not at all  Social Connections: Moderately Isolated (11/24/2023)   Social Connection and Isolation Panel    Frequency of Communication with Friends and Family: Three times a week    Frequency of Social Gatherings with Friends and Family: More than three times a week    Attends Religious Services: Never    Database administrator or Organizations: No    Attends Banker Meetings: Never    Marital Status: Married  Catering manager Violence: Not At Risk (11/24/2023)   Humiliation, Afraid, Rape, and Kick questionnaire    Fear of Current or Ex-Partner: No    Emotionally Abused: No    Physically Abused: No    Sexually Abused: No    Family History  Problem Relation Age of Onset   Diabetes Mother    Heart attack Brother    Breast cancer Neg Hx     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    Varicose vein of leg    Wears glasses     Patient Active Problem List   Diagnosis Date Noted   Candidiasis, intertrigo 08/26/2023   Seborrheic keratoses 08/26/2023   Greater trochanteric pain syndrome 05/30/2023  Acute cystitis without hematuria 05/30/2023   Encounter for colorectal cancer screening 03/22/2023   Nausea without vomiting 03/17/2023    Flatulence 03/17/2023   S/P hysterectomy 10/06/2022   Urinary frequency 10/06/2022   Vaginal burning 10/06/2022   Vaginal irritation 10/06/2022   Vulvar irritation 10/06/2022   Sebaceous cyst of labia 10/06/2022   BPPV (benign paroxysmal positional vertigo), left 01/29/2022   Laryngopharyngeal reflux (LPR) 01/29/2022   Pharyngoesophageal dysphagia 01/29/2022   HLD (hyperlipidemia)     Past Surgical History:  Procedure Laterality Date   ANTERIOR AND POSTERIOR REPAIR WITH SACROSPINOUS FIXATION N/A 07/19/2022   Procedure: POSTERIOR REPAIR WITHP PERINEORRHAPHY AND  SACROSPINOUS FIXATION;  Surgeon: Marilynne Rosaline SAILOR, MD;  Location: [facility];  Service: Gynecology;  Laterality: N/A;  Total time requested is 1.5hrs   BREAST CYST EXCISION Left    CHOLECYSTECTOMY     CHOLECYSTECTOMY, LAPAROSCOPIC  2010   COLONOSCOPY WITH PROPOFOL  N/A 03/22/2023   Procedure: COLONOSCOPY WITH PROPOFOL ;  Surgeon: Eartha Angelia Sieving, MD;  Location: [facility];  Service: Gastroenterology;  Laterality: N/A;  8:45AM;ASA 1   TOTAL ABDOMINAL HYSTERECTOMY W/ BILATERAL SALPINGOOPHORECTOMY     1990s    Current Outpatient Medications  Medication Sig Dispense Refill   aspirin  EC 325 MG tablet Take 1 tablet (325 mg total) by mouth daily. 14 tablet 0   atorvastatin  (LIPITOR) 40 MG tablet Take 1 tablet (40 mg total) by mouth daily. 90 tablet 2   Calcium  Carb-Cholecalciferol (CALCIUM /VITAMIN D) 600-400 MG-UNIT TABS Take 2 tablets by mouth daily.     FLUoxetine  (PROZAC ) 10 MG capsule Take 1 capsule (10 mg total) by mouth daily. 90 capsule 1   LORazepam  (ATIVAN ) 1 MG tablet Take 1 tablet (1 mg total) by mouth 3 (three) times daily as needed for anxiety. 15 tablet 0   ondansetron  (ZOFRAN -ODT) 4 MG disintegrating tablet Take 1 tablet (4 mg total) by mouth every 8 (eight) hours as needed for nausea or vomiting. 30 tablet 0   oxybutynin  (DITROPAN  XL) 5 MG 24 hr tablet Take 1 tablet (5 mg total) by  mouth at bedtime. 30 tablet 11   pantoprazole  (PROTONIX ) 40 MG tablet Take 1 tablet (40 mg total) by mouth daily. 90 tablet 2   oxyCODONE  (ROXICODONE ) 5 MG immediate release tablet Take 1 tablet (5 mg total) by mouth every 4 (four) hours as needed for severe pain (pain score 7-10) or breakthrough pain. (Patient not taking: Reported on 12/15/2023) 30 tablet 0   No current facility-administered medications for this visit.    Allergies as of 12/15/2023   (No Known Allergies)    Vitals: BP 131/67   Pulse 81   Ht 5' 1 (1.549 m)   Wt 157 lb (71.2 kg)   BMI 29.66 kg/m  Last Weight:  Wt Readings from Last 1 Encounters:  12/16/23 157 lb (71.2 kg)   Last Height:   Ht Readings from Last 1 Encounters:  12/16/23 5' 1 (1.549 m)     Physical exam: Exam: Gen: NAD, conversant, well nourised, well groomed                     CV: RRR, no MRG. No Carotid Bruits. No peripheral edema, warm, nontender Eyes: Conjunctivae clear without exudates or hemorrhage  Neuro: Detailed Neurologic Exam  Speech:    Speech is normal; fluent and spontaneous with normal comprehension.  Cognition:    The patient is oriented to person, place, and time;     recent  and remote memory intact;     language fluent;     normal attention, concentration,     fund of knowledge Cranial Nerves:    The pupils are equal, round, and reactive to light.pupils too small to visualize fndi, attempted Visual fields are full to finger confrontation. Extraocular movements are intact. Trigeminal sensation is intact and the muscles of mastication are normal. The face is symmetric. The palate elevates in the midline. Hearing intact. Voice is normal. Shoulder shrug is normal. The tongue has normal motion without fasciculations.   Coordination: nml  Gait: nml  Motor Observation:    No asymmetry, no atrophy, and no involuntary movements noted. Tone:    Normal muscle tone.    Posture:    Posture is normal. normal erect     Strength:    Strength is V/V in the upper and lower limbs.      Sensation: intact to LT     Reflex Exam:  DTR's:    Deep tendon reflexes in the upper and lower extremities are symmetrical bilaterally.   Toes:    The toes are downgoing bilaterally.   Clonus:    Clonus is absent.    Assessment/Plan:  Patient with a hx of migraines remotely with tension-type headache however given hasn;t had a headache in many years and this was acute onset and ongoing recommend MRI of the brain and orbits given eye pain and pressure to evaluate for cavernous sinus lesions or uveitis or  strokes, malignancies, vasculidities, demyelination(multiple sclerosis) or other   Orders Placed This Encounter  Procedures   MR BRAIN W WO CONTRAST   MR ORBITS W WO CONTRAST   Sedimentation rate   C-reactive protein   No orders of the defined types were placed in this encounter.   Cc: Long, Fonda MATSU, MD,  Duanne Butler DASEN, MD  Onetha Epp, MD  [facility] [address] [place], [place] [address]  Phone [phone] Fax [phone]

## 2023-12-15 NOTE — Telephone Encounter (Signed)
 [employer] medicare shara: [phone] exp. 12/15/23-01/13/24 sent to GI [phone]

## 2023-12-15 NOTE — Patient Instructions (Addendum)
 MRI of the brain and orbits Labs  Generalized Anxiety Disorder, Adult Generalized anxiety disorder (GAD) is a mental health condition. Unlike normal worries, anxiety related to GAD is not triggered by a specific event. These worries do not fade or get better with time. GAD interferes with relationships, work, and school. GAD symptoms can vary from mild to severe. People with severe GAD can have intense waves of anxiety with physical symptoms that are similar to panic attacks. What are the causes? The exact cause of GAD is not known, but the following are believed to have an impact: Differences in natural brain chemicals. Genes passed down from parents to children. Differences in the way threats are perceived. Development and stress during childhood. Personality. What increases the risk? The following factors may make you more likely to develop this condition: Being female. Having a family history of anxiety disorders. Being very shy. Experiencing very stressful life events, such as the death of a loved one. Having a very stressful family environment. What are the signs or symptoms? People with GAD often worry excessively about many things in their lives, such as their health and family. Symptoms may also include: Mental and emotional symptoms: Worrying excessively about natural disasters. Fear of being late. Difficulty concentrating. Fears that others are judging your performance. Physical symptoms: Fatigue. Headaches, muscle tension, muscle twitches, trembling, or feeling shaky. Feeling like your heart is pounding or beating very fast. Feeling out of breath or like you cannot take a deep breath. Having trouble falling asleep or staying asleep, or experiencing restlessness. Sweating. Nausea, diarrhea, or irritable bowel syndrome (IBS). Behavioral symptoms: Experiencing erratic moods or irritability. Avoidance of new situations. Avoidance of people. Extreme difficulty making  decisions. How is this diagnosed? This condition is diagnosed based on your symptoms and medical history. You will also have a physical exam. Your health care provider may perform tests to rule out other possible causes of your symptoms. To be diagnosed with GAD, a person must have anxiety that: Is out of his or her control. Affects several different aspects of his or her life, such as work and relationships. Causes distress that makes him or her unable to take part in normal activities. Includes at least three symptoms of GAD, such as restlessness, fatigue, trouble concentrating, irritability, muscle tension, or sleep problems. Before your health care provider can confirm a diagnosis of GAD, these symptoms must be present more days than they are not, and they must last for 6 months or longer. How is this treated? This condition may be treated with: Medicine. Antidepressant medicine is usually prescribed for long-term daily control. Anti-anxiety medicines may be added in severe cases, especially when panic attacks occur. Talk therapy (psychotherapy). Certain types of talk therapy can be helpful in treating GAD by providing support, education, and guidance. Options include: Cognitive behavioral therapy (CBT). People learn coping skills and self-calming techniques to ease their physical symptoms. They learn to identify unrealistic thoughts and behaviors and to replace them with more appropriate thoughts and behaviors. Acceptance and commitment therapy (ACT). This treatment teaches people how to be mindful as a way to cope with unwanted thoughts and feelings. Biofeedback. This process trains you to manage your body's response (physiological response) through breathing techniques and relaxation methods. You will work with a therapist while machines are used to monitor your physical symptoms. Stress management techniques. These include yoga, meditation, and exercise. A mental health specialist can help  determine which treatment is best for you. Some people see improvement with  one type of therapy. However, other people require a combination of therapies. Follow these instructions at home: Lifestyle Maintain a consistent routine and schedule. Anticipate stressful situations. Create a plan and allow extra time to work with your plan. Practice stress management or self-calming techniques that you have learned from your therapist or your health care provider. Exercise regularly and spend time outdoors. Eat a healthy diet that includes plenty of vegetables, fruits, whole grains, low-fat dairy products, and lean protein. Do not eat a lot of foods that are high in fat, added sugar, or salt (sodium). Drink plenty of water. Avoid alcohol. Alcohol can increase anxiety. Avoid caffeine and certain over-the-counter cold medicines. These may make you feel worse. Ask your pharmacist which medicines to avoid. General instructions Take over-the-counter and prescription medicines only as told by your health care provider. Understand that you are likely to have setbacks. Accept this and be kind to yourself as you persist to take better care of yourself. Anticipate stressful situations. Create a plan and allow extra time to work with your plan. Recognize and accept your accomplishments, even if you judge them as small. Spend time with people who care about you. Keep all follow-up visits. This is important. Where to find more information [employer] of Mental [employer]: http://www.maynard.net/ Substance Abuse and Mental Health Services: SkateOasis.com.pt Contact a health care provider if: Your symptoms do not get better. Your symptoms get worse. You have signs of depression, such as: A persistently sad or irritable mood. Loss of enjoyment in activities that used to bring you joy. Change in weight or eating. Changes in sleeping habits. Get help right away if: You have thoughts about hurting yourself or  others. If you ever feel like you may hurt yourself or others, or have thoughts about taking your own life, get help right away. Go to your nearest emergency department or: Call your local emergency services (911 in the [place] Call a suicide crisis helpline, such as the National Suicide Prevention Lifeline at 270 676 6112 or 988 in the [place] This is open 24 hours a day in the [place] If you're a Veteran: Call 988 and press 1. This is open 24 hours a day. Text the [employer] at 352-006-6546. Summary Generalized anxiety disorder (GAD) is a mental health condition that involves worry that is not triggered by a specific event. People with GAD often worry excessively about many things in their lives, such as their health and family. GAD may cause symptoms such as restlessness, trouble concentrating, sleep problems, frequent sweating, nausea, diarrhea, headaches, and trembling or muscle twitching. A mental health specialist can help determine which treatment is best for you. Some people see improvement with one type of therapy. However, other people require a combination of therapies. This information is not intended to replace advice given to you by your health care provider. Make sure you discuss any questions you have with your health care provider. Document Revised: 01/13/2023 Document Reviewed: 09/21/2020 Elsevier Patient Education  2024 Elsevier Inc.  Tension Headache, Adult A tension headache is a feeling of pain, pressure, or aching over the front and sides of the head. The pain can be dull, or it can feel tight. There are two types of tension headache: Episodic tension headache. This is when the headaches happen fewer than 15 days a month. Chronic tension headache. This is when the headaches happen more than 15 days a month during a 32-month period. A tension headache can last from 30 minutes to several days. It is  the most common kind of headache. Tension headaches are not normally  associated with nausea or vomiting, and they do not get worse with physical activity. What are the causes? The exact cause of this condition is not known. Tension headaches are often triggered by stress, anxiety, or depression. Other triggers may include: Alcohol. Too much caffeine or caffeine withdrawal. Respiratory infections, such as colds, flu, or sinus infections. Dental problems or teeth clenching. Fatigue. Holding your head and neck in the same position for a long period of time, such as while using a computer. Smoking. Arthritis of the neck. What are the signs or symptoms? Symptoms of this condition include: A feeling of pressure or tightness around the head. Dull, aching head pain. Pain over the front and sides of the head. Tenderness in the muscles of the head, neck, and shoulders. How is this diagnosed? This condition may be diagnosed based on your symptoms, your medical history, and a physical exam. If your symptoms are severe or unusual, you may have imaging tests, such as a CT scan or an MRI of your head. Your vision may also be checked. How is this treated? This condition may be treated with lifestyle changes and with medicines that help relieve symptoms. Follow these instructions at home: Managing pain Take over-the-counter and prescription medicines only as told by your health care provider. When you have a headache, lie down in a dark, quiet room. If directed, put ice on your head and neck. To do this: Put ice in a plastic bag. Place a towel between your skin and the bag. Leave the ice on for 20 minutes, 2-3 times a day. Remove the ice if your skin turns bright red. This is very important. If you cannot feel pain, heat, or cold, you have a greater risk of damage to the area. If directed, apply heat to the back of your neck as often as told by your health care provider. Use the heat source that your health care provider recommends, such as a moist heat pack or a  heating pad. Place a towel between your skin and the heat source. Leave the heat on for 20-30 minutes. Remove the heat if your skin turns bright red. This is especially important if you are unable to feel pain, heat, or cold. You have a greater risk of getting burned. Eating and drinking Eat meals on a regular schedule. If you drink alcohol: Limit how much you have to: 0-1 drink a day for women who are not pregnant. 0-2 drinks a day for men. Know how much alcohol is in your drink. In the [place] one drink equals one 12 oz bottle of beer (355 mL), one 5 oz glass of wine (148 mL), or one 1 oz glass of hard liquor (44 mL). Drink enough fluid to keep your urine pale yellow. Decrease your caffeine intake, or stop using caffeine. Lifestyle Get 7-9 hours of sleep each night, or get the amount of sleep recommended by your health care provider. At bedtime, remove computers, phones, and tablets from your room. Find ways to manage your stress. This may include: Exercise. Deep breathing exercises. Yoga. Listening to music. Positive mental imagery. Try to sit up straight and avoid tensing your muscles. Do not use any products that contain nicotine or tobacco. These include cigarettes, chewing tobacco, and vaping devices, such as e-cigarettes. If you need help quitting, ask your health care provider. General instructions  Avoid any headache triggers. Keep a journal to help find out what may  trigger your headaches. For example, write down: What you eat and drink. How much sleep you get. Any change to your diet or medicines. Keep all follow-up visits. This is important. Contact a health care provider if: Your headache does not get better. Your headache comes back. You are sensitive to sounds, light, or smells because of a headache. You have nausea or you vomit. Your stomach hurts. Get help right away if: You suddenly develop a severe headache, along with any of the following: A stiff  neck. Nausea and vomiting. Confusion. Weakness in one part or one side of your body. Double vision or loss of vision. Shortness of breath. Rash. Unusual sleepiness. Fever or chills. Trouble speaking. Pain in your eye or ear. Trouble walking or balancing. Feeling faint or passing out. Summary A tension headache is a feeling of pain, pressure, or aching over the front and sides of the head. A tension headache can last from 30 minutes to several days. It is the most common kind of headache. This condition may be diagnosed based on your symptoms, your medical history, and a physical exam. This condition may be treated with lifestyle changes and with medicines that help relieve symptoms. This information is not intended to replace advice given to you by your health care provider. Make sure you discuss any questions you have with your health care provider. Document Revised: 02/25/2023 Document Reviewed: 02/25/2023 Elsevier Patient Education  2024 [employer].

## 2023-12-16 ENCOUNTER — Other Ambulatory Visit: Payer: Self-pay

## 2023-12-16 ENCOUNTER — Encounter (HOSPITAL_COMMUNITY): Payer: Self-pay

## 2023-12-16 ENCOUNTER — Emergency Department (HOSPITAL_COMMUNITY)
Admission: EM | Admit: 2023-12-16 | Discharge: 2023-12-16 | Disposition: A | Discharge status: Home / Self Care | Attending: Emergency Medicine | Admitting: Emergency Medicine

## 2023-12-16 DIAGNOSIS — Z7982 Long term (current) use of aspirin: Secondary | ICD-10-CM | POA: Diagnosis not present

## 2023-12-16 DIAGNOSIS — K921 Melena: Secondary | ICD-10-CM | POA: Insufficient documentation

## 2023-12-16 DIAGNOSIS — R195 Other fecal abnormalities: Secondary | ICD-10-CM

## 2023-12-16 LAB — COMPREHENSIVE METABOLIC PANEL WITH GFR
ALT: 20 U/L (ref 0–44)
AST: 19 U/L (ref 15–41)
Albumin: 4.1 g/dL (ref 3.5–5.0)
Alkaline Phosphatase: 55 U/L (ref 38–126)
Anion gap: 10 (ref 5–15)
BUN: 14 mg/dL (ref 8–23)
CO2: 24 mmol/L (ref 22–32)
Calcium: 9.5 mg/dL (ref 8.9–10.3)
Chloride: 99 mmol/L (ref 98–111)
Creatinine, Ser: 0.72 mg/dL (ref 0.44–1.00)
GFR, Estimated: >60 mL/min (ref >60)
Glucose, Bld: 108 mg/dL — ABNORMAL HIGH (ref 70–99)
Potassium: 3.7 mmol/L (ref 3.5–5.1)
Sodium: 133 mmol/L — ABNORMAL LOW (ref 135–145)
Total Bilirubin: 1.3 mg/dL — ABNORMAL HIGH (ref 0.0–1.2)
Total Protein: 6.8 g/dL (ref 6.5–8.1)

## 2023-12-16 LAB — SEDIMENTATION RATE: Sed Rate: 2 mm/h (ref 0–40)

## 2023-12-16 LAB — C-REACTIVE PROTEIN: CRP: <1 mg/L (ref 0–10)

## 2023-12-16 LAB — CBC
HCT: 40.7 % (ref 36.0–46.0)
Hemoglobin: 13.7 g/dL (ref 12.0–15.0)
MCH: 30.4 pg (ref 26.0–34.0)
MCHC: 33.7 g/dL (ref 30.0–36.0)
MCV: 90.2 fL (ref 80.0–100.0)
Platelets: 343 K/uL (ref 150–400)
RBC: 4.51 MIL/uL (ref 3.87–5.11)
RDW: 12.8 % (ref 11.5–15.5)
WBC: 6.8 K/uL (ref 4.0–10.5)
nRBC: 0 % (ref 0.0–0.2)

## 2023-12-16 LAB — POC OCCULT BLOOD, ED: Fecal Occult Bld: NEGATIVE

## 2023-12-16 NOTE — Discharge Instructions (Signed)
 The stool color changes are related to your Pepto-Bismol use.  Your blood count is normal and there is no signs of blood on the stool test.  Follow-up with your primary care doctor as planned

## 2023-12-16 NOTE — ED Provider Notes (Signed)
 [place] EMERGENCY DEPARTMENT AT [facility] Provider Note   CSN: [id number] Arrival date & time: 12/16/23  1102     Patient presents with: Dark Stool   Taylor Singleton is a 68 y.o. female.   HPI   Patient has a history of hyperlipidemia arthritis osteoporosis sciatica acid reflux hyperlipidemia constipation.  Patient has been having issues over the last couple of weeks with anxiety as well as headache.  Patient was evaluated emergency room on the 27th as well as the first.  Patient also has followed up with her primary care doctor.  Patient came to the ED today however because she noticed dark-colored stools.  Patient states they were black in color.  Her most recent bowel movement however was more normal in color.  Patient has recently taken Pepto-Bismol.  Prior to Admission medications   Medication Sig Start Date End Date Taking? Authorizing Provider  aspirin  EC 325 MG tablet Take 1 tablet (325 mg total) by mouth daily. 11/23/23   Genelle Standing, MD  atorvastatin  (LIPITOR) 40 MG tablet Take 1 tablet (40 mg total) by mouth daily. 06/17/23   Duanne Butler DASEN, MD  Calcium  Carb-Cholecalciferol (CALCIUM /VITAMIN D) 600-400 MG-UNIT TABS Take 2 tablets by mouth daily.    [provider]  cephALEXin  (KEFLEX ) 500 MG capsule Take 1 capsule (500 mg total) by mouth 2 (two) times daily for 7 days. 12/09/23 12/16/23  Long, Fonda MATSU, MD  FLUoxetine  (PROZAC ) 10 MG capsule Take 1 capsule (10 mg total) by mouth daily. 12/14/23   Aletha Bene, MD  LORazepam  (ATIVAN ) 1 MG tablet Take 1 tablet (1 mg total) by mouth 3 (three) times daily as needed for anxiety. 12/13/23   Tegeler, Lonni PARAS, MD  ondansetron  (ZOFRAN -ODT) 4 MG disintegrating tablet Take 1 tablet (4 mg total) by mouth every 8 (eight) hours as needed for nausea or vomiting. 12/14/23   Aletha Bene, MD  oxybutynin  (DITROPAN  XL) 5 MG 24 hr tablet Take 1 tablet (5 mg total) by mouth at bedtime. 09/13/23   Ozan, Jennifer, DO   oxyCODONE  (ROXICODONE ) 5 MG immediate release tablet Take 1 tablet (5 mg total) by mouth every 4 (four) hours as needed for severe pain (pain score 7-10) or breakthrough pain. Patient not taking: Reported on 12/15/2023 11/23/23   Genelle Standing, MD  pantoprazole  (PROTONIX ) 40 MG tablet Take 1 tablet (40 mg total) by mouth daily. 06/17/23   Duanne Butler DASEN, MD    Allergies: Patient has no known allergies.    Review of Systems  Updated Vital Signs BP 133/82 (BP Location: Right Arm)   Pulse 88   Temp 98.4 F (36.9 C) (Oral)   Resp 18   Ht 1.549 m (5' 1)   Wt 71.2 kg   SpO2 97%   BMI 29.66 kg/m   Physical Exam Vitals and nursing note reviewed.  Constitutional:      General: She is not in acute distress.    Appearance: She is well-developed.  HENT:     Head: Normocephalic and atraumatic.     Right Ear: External ear normal.     Left Ear: External ear normal.  Eyes:     General: No scleral icterus.       Right eye: No discharge.        Left eye: No discharge.     Conjunctiva/sclera: Conjunctivae normal.  Neck:     Trachea: No tracheal deviation.  Cardiovascular:     Rate and Rhythm: Normal rate and regular rhythm.  Pulmonary:     Effort: Pulmonary effort is normal. No respiratory distress.     Breath sounds: Normal breath sounds. No stridor. No wheezing or rales.  Abdominal:     General: Bowel sounds are normal. There is no distension.     Palpations: Abdomen is soft.     Tenderness: There is no abdominal tenderness. There is no guarding or rebound.  Musculoskeletal:        General: No tenderness or deformity.     Cervical back: Neck supple.  Skin:    General: Skin is warm and dry.  Neurological:     General: No focal deficit present.     Mental Status: She is alert.     Cranial Nerves: No cranial nerve deficit, dysarthria or facial asymmetry.     Sensory: No sensory deficit.     Motor: No abnormal muscle tone or seizure activity.     Coordination: Coordination  normal.  Psychiatric:        Mood and Affect: Mood normal.     (all labs ordered are listed, but only abnormal results are displayed) Labs Reviewed  COMPREHENSIVE METABOLIC PANEL WITH GFR - Abnormal; Notable for the following components:      Result Value   Sodium 133 (*)    Glucose, Bld 108 (*)    Total Bilirubin 1.3 (*)    All other components within normal limits  CBC  POC OCCULT BLOOD, ED    EKG: None  Radiology: No results found.   Procedures   Medications Ordered in the ED - No data to display  Clinical Course as of 12/16/23 1425  Fri Dec 16, 2023  1424 CBC normal.  Metabolic panel notable for bilirubin slight elevated 1.3 sodium decreased 133.  Similar to previous values. [JK]  1424 Occult is negative [JK]    Clinical Course User Index [JK] Randol Simmonds, MD                                 Medical Decision Making Amount and/or Complexity of Data Reviewed Labs: ordered.   Patient presented to ED with complaints of dark-colored stools.  Consider the possibility of a GI bleeding however she has normal hemoglobin and is Hemoccult negative.  I suspect the dark-colored stool was related to her recent Pepto-Bismol use.  Evaluation and diagnostic testing in the emergency department does not suggest an emergent condition requiring admission or immediate intervention beyond what has been performed at this time.  The patient is safe for discharge and has been instructed to return immediately for worsening symptoms, change in symptoms or any other concerns.     Final diagnoses:  Discoloration of stool    ED Discharge Orders     None          Randol Simmonds, MD 12/16/23 1440

## 2023-12-16 NOTE — ED Triage Notes (Signed)
 Patient has had 5 black colored stools since yesterday. No abdominal pain. No vomiting. Recently started taking xanax  5 days ago.

## 2023-12-18 ENCOUNTER — Encounter: Payer: Self-pay | Admitting: Neurology

## 2023-12-19 ENCOUNTER — Ambulatory Visit: Payer: Self-pay | Admitting: Neurology

## 2023-12-19 ENCOUNTER — Ambulatory Visit (HOSPITAL_BASED_OUTPATIENT_CLINIC_OR_DEPARTMENT_OTHER): Attending: Orthopaedic Surgery | Admitting: Physical Therapy

## 2023-12-19 ENCOUNTER — Encounter (HOSPITAL_BASED_OUTPATIENT_CLINIC_OR_DEPARTMENT_OTHER): Payer: Self-pay | Admitting: Physical Therapy

## 2023-12-19 DIAGNOSIS — M25552 Pain in left hip: Secondary | ICD-10-CM | POA: Diagnosis not present

## 2023-12-19 DIAGNOSIS — R2689 Other abnormalities of gait and mobility: Secondary | ICD-10-CM | POA: Diagnosis not present

## 2023-12-19 DIAGNOSIS — M25652 Stiffness of left hip, not elsewhere classified: Secondary | ICD-10-CM | POA: Insufficient documentation

## 2023-12-19 NOTE — Therapy (Signed)
 [facility] LOWER EXTREMITY TREATMENT   Patient Name: Taylor Singleton MRN: [id number] DOB:14-Mar-1956, 68 y.o., female Today's Date: 12/19/2023  END OF SESSION:  PT End of Session - 12/19/23 1034     Visit Number 4    Number of Visits 12    Date for PT Re-Evaluation 12/16/23           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    Varicose vein of leg    Wears glasses    Past Surgical History:  Procedure Laterality Date   ANTERIOR AND POSTERIOR REPAIR WITH SACROSPINOUS FIXATION N/A 07/19/2022   Procedure: POSTERIOR REPAIR WITHP PERINEORRHAPHY AND  SACROSPINOUS FIXATION;  Surgeon: Marilynne Rosaline SAILOR, MD;  Location: [facility];  Service: Gynecology;  Laterality: N/A;  Total time requested is 1.5hrs   BREAST CYST EXCISION Left    CHOLECYSTECTOMY     CHOLECYSTECTOMY, LAPAROSCOPIC  2010   COLONOSCOPY WITH PROPOFOL  N/A 03/22/2023   Procedure: COLONOSCOPY WITH PROPOFOL ;  Surgeon: Eartha Angelia Sieving, MD;  [facility];  Service: Gastroenterology;  Laterality: N/A;  8:45AM;ASA 1   TOTAL ABDOMINAL HYSTERECTOMY W/ BILATERAL SALPINGOOPHORECTOMY     1990s   Patient Active Problem List   Diagnosis Date Noted   Candidiasis, intertrigo 08/26/2023   Seborrheic keratoses 08/26/2023   Greater trochanteric pain syndrome 05/30/2023   Acute cystitis without hematuria 05/30/2023   Encounter for colorectal cancer screening 03/22/2023   Nausea without vomiting 03/17/2023   Flatulence 03/17/2023   S/P hysterectomy 10/06/2022   Urinary frequency 10/06/2022   Vaginal burning 10/06/2022   Vaginal irritation 10/06/2022   Vulvar irritation 10/06/2022   Sebaceous cyst of labia 10/06/2022   BPPV (benign  paroxysmal positional vertigo), left 01/29/2022   Laryngopharyngeal reflux (LPR) 01/29/2022   Pharyngoesophageal dysphagia 01/29/2022   HLD (hyperlipidemia)     PCP:  Dr Butler Burr MD  REFERRING PROVIDER: Dr Elspeth Parker   REFERRING DIAG:  Diagnosis  M54.16 (ICD-10-CM) - Lumbar radiculopathy   THERAPY DIAG:  No diagnosis found.  Rationale for Evaluation and Treatment: Rehabilitation  ONSET DATE:  Has had hip pain for several years   SUBJECTIVE:   SUBJECTIVE STATEMENT: The patient has had some abdominal issues over the past several weeks. She has had some nausea this morning but ist has improved.   She has graduation party to go to in July (26th) in [place]. She's wondering if she should just go ahead and have the surgery for hip, instead of prolonging the inevitable.   From initial evaluation: The patient has a long history of left hip pain. She also had lumbar spine pain. She had a lumbar diskectomy earlier this year. She has recovered well from this but continued to have lateral  hip pain. She was found to have a gluteal tear. She comes in to therapy today for a pre-hab program prior to likely lateral hip fixation.  She has been exercising but is unsure what she can do with the hip. She is a member of the gym.   PERTINENT HISTORY: Multi joint OA, Osteoporosis, vaginal prolapse, sciatica,  PAIN:  Are you having pain? no: NPRS scale: 0/10  Pain location: left hip  Pain description: achng  Aggravating factors: standing and walking  Relieving factors: rest; best first thing in morning  PRECAUTIONS: None  RED FLAGS: None   WEIGHT BEARING RESTRICTIONS: No  FALLS:  Has patient fallen in last 6 months? Yes. Number of falls a month ago. Caught foot on her purse  LIVING ENVIRONMENT: Has 5 steps up the stairs  OCCUPATION:  Works two days a week at [employer].   Recreation:  Shop/ go out to lunch    PLOF: Independent  PATIENT GOALS:    NEXT MD VISIT: Nothing  scheduled   OBJECTIVE:  Note: Objective measures were completed at Evaluation unless otherwise noted.  DIAGNOSTIC FINDINGS:   MRI: IMPRESSION: 1. Complete tear of the left gluteus minimus tendon insertion. Full-thickness tear of the anterior aspect of the left gluteus medius tendon insertion. 2. Left anterior superior labral tear. 3. Mild osteoarthritis of bilateral hips. PATIENT SURVEYS:  LEFS  Extreme difficulty/unable (0), Quite a bit of difficulty (1), Moderate difficulty (2), Little difficulty (3), No difficulty (4) Survey date:    Any of your usual work, housework or school activities   2. Usual hobbies, recreational or sporting activities   3. Getting into/out of the bath   4. Walking between rooms   5. Putting on socks/shoes   6. Squatting    7. Lifting an object, like a bag of groceries from the floor   8. Performing light activities around your home   9. Performing heavy activities around your home   10. Getting into/out of a car   11. Walking 2 blocks   12. Walking 1 mile   13. Going up/down 10 stairs (1 flight)   14. Standing for 1 hour   15.  sitting for 1 hour   16. Running on even ground   17. Running on uneven ground   18. Making sharp turns while running fast   19. Hopping    20. Rolling over in bed   Score total:  29/80     COGNITION: Overall cognitive status: Within functional limits for tasks assessed     SENSATION: WFL  EDEMA:  None  MUSCLE LENGTH:  POSTURE: No Significant postural limitations  PALPATION:   LOWER EXTREMITY ROM:  Passive ROM Right eval Left eval  Hip flexion  Pain past 90 degrees   Hip extension    Hip abduction    Hip adduction    Hip internal rotation    Hip external rotation  Pain at end range   Knee flexion    Knee extension    Ankle dorsiflexion    Ankle plantarflexion    Ankle inversion    Ankle eversion     (Blank rows = not tested)  LOWER EXTREMITY MMT:  MMT Right eval Left eval  Hip flexion  23 20.9  Hip extension    Hip abduction 24.5 15.5  Hip adduction    Hip internal rotation    Hip external rotation    Knee flexion    Knee extension 27.5 27.9  Ankle dorsiflexion    Ankle plantarflexion    Ankle inversion    Ankle eversion     (Blank rows = not tested)   GAIT: Mild decrease in left weight bearing  TREATMENT DATE:  7/7  Self care: Reviewed expectations following surgery Reviewed how to get up and down the stairs  There-ex:  Reviewed LTR 2x10  Standing   Neuro-re-ed:  Supine march with education on progression 3x10   Bridge 2x10         [facility] Adult PT Treatment:                                                DATE: 11/21/23  Therapeutic Exercise: NuStep L4, LEs only: x 5 min for warm up STS from NuStep 2 x 10, cues for even weight between feet  L clam 2 x 10, R clam x 10 L hip abdct x 10 (unable to tolerate L sidelying for R hip abdct) Bridge with cues for lat press, glute and ab set x 12, 3 sec pause Seated with red band around ankles: LAQ  x10 LLE Standing hip abdct x 10 each LE Side marching with increased step height Tandem stance x 20s each LE forward seated piriformis stretch x 15s x 2 reps each    [facility] Adult PT Treatment:                                                DATE: 11/18/23  Therapeutic Exercise: NuStep L4, LEs only: x 5 min for warm up STS from NuStep x 10 Seated with yellow band around ankles: LAQ 2 x10 Seated with yellow hand around thighs:  hip abdct 2 x 10, 2nd set with 3 sec pause; seated marching x 15 Bridge with cues for lat press, glute and ab set - 2 x 10, 2nd set with 3 sec pause Supine piriformis stretch x 15s x 2 reps each    PATIENT EDUCATION:  Education details: HEP, symptom management  Person educated: Patient Education method: Explanation, Demonstration, Tactile cues, Verbal cues,  and Handouts Education comprehension: verbalized understanding, returned demonstration, verbal cues required, tactile cues required, and needs further education  HOME EXERCISE PROGRAM: Access Code: [email or web address] URL: [email or web address] Date: 11/07/2023 Prepared by: Alm Don  Exercises - Supine Bridge  - 1 x daily - 7 x weekly - 3 sets - 10 reps - Supine March  - 1 x daily - 7 x weekly - 3 sets - 10 reps - Seated Knee Extension with Resistance  - 1 x daily - 7 x weekly - 3 sets - 10 reps - Seated Hip Abduction with Resistance  - 1 x daily - 7 x weekly - 3 sets - 10 reps  ASSESSMENT:  CLINICAL IMPRESSION: The patient has progressed well. She has a full exercise program. She feels comfortable with her exercises. We expanded her supine and standing exercise program. She had no significant pain. We educated her on what to expect following surgery and how to get up and down the stairs. She was advised not to do her HEP post-op. She will come here and we will work on her exercises. She will discharge at this time. She was advised not to do any exercises that irritate her hip prior to the surgery.    From initial evaluation:  Patient is a 68 year old female presents to physical therapy left hip pain.  MRI shows complete tear of the glute  min and tendon and a full-thickness tear of the anterior portion of the gluteus medius tendon.  She presents with tenderness to palpation, left hip abductor weakness, and increased pain with general functional mobility.  She would benefit from skilled therapy to improve strength and general functional mobility.  She is likely going to have surgery in the future.  Will build her an exercise program to strengthen prior to her procedure  OBJECTIVE IMPAIRMENTS: Abnormal gait, decreased activity tolerance, decreased mobility, difficulty walking, decreased ROM, decreased strength, and pain.   ACTIVITY LIMITATIONS: carrying, lifting, bending, sitting,  standing, squatting, sleeping, stairs, transfers, and locomotion level  PARTICIPATION LIMITATIONS: meal prep, cleaning, laundry, driving, shopping, community activity, occupation, and yard work  PERSONAL FACTORS: 1-2 comorbidities: multi joint OA, back pain, osteoporosis  are also affecting patient's functional outcome.   REHAB POTENTIAL: Good  CLINICAL DECISION MAKING: Evolving/moderate complexity progressive increase in pain   EVALUATION COMPLEXITY: Low   GOALS: Goals reviewed with patient? Yes  SHORT TERM GOALS: Target date: 12/02/2023   Patient will increase hip abduction strength on the left by 5 pounds Baseline: Goal status: INITIAL  2.  Patient will report a 50% reduction in pain when she lies on her left side Baseline:  Goal status: INITIAL  3.  Patient will be independent with basic HEP Baseline:  Goal status: INITIAL   LONG TERM GOALS: Target date: 12/30/2023      Patient will have complete home exercise program prior to  surgical intervention Baseline:  Goal status: INITIAL  PLAN:  PT FREQUENCY: 1-2x/week  PT DURATION: 8 weeks  PLANNED INTERVENTIONS: 97110-Therapeutic exercises, 97530- Therapeutic activity, [id number]- Neuromuscular re-education, 97535- Self Care, [id number]- Manual therapy, [id number]- Gait training, [id number]- Aquatic Therapy, [id number]- Electrical stimulation (unattended), 97035- Ultrasound, Patient/Family education, Stair training, Taping, Dry Needling, DME instructions, Cryotherapy, and Moist heat   PLAN FOR NEXT SESSION:  Review HEP.  If patient tolerates well consider light gym exercises.  Keep exercises light at this time.  Consider manual therapy if needed.  Consider standing hip 3-way series.  Alm Don PT DPT 12/19/23 11:53 AM [facility] [address] [place], [place], [address] Phone: [phone]   Fax:  [phone]

## 2023-12-20 ENCOUNTER — Encounter: Payer: Self-pay | Admitting: Family Medicine

## 2023-12-20 ENCOUNTER — Ambulatory Visit (INDEPENDENT_AMBULATORY_CARE_PROVIDER_SITE_OTHER): Admitting: Family Medicine

## 2023-12-20 VITALS — BP 124/74 | HR 81 | Temp 98.6°F | Ht 61.0 in | Wt 155.2 lb

## 2023-12-20 DIAGNOSIS — F419 Anxiety disorder, unspecified: Secondary | ICD-10-CM

## 2023-12-20 DIAGNOSIS — R110 Nausea: Secondary | ICD-10-CM

## 2023-12-20 NOTE — Progress Notes (Signed)
 Patient Office Visit  Assessment & Plan:  Anxiety  Nausea   Assessment and Plan    Anxiety Anxiety likely related to recent medical evaluations and upcoming surgery. Lorazepam  discussed for acute relief; fluoxetine  recommended for long-term management. Advised starting lower fluoxetine  dose due to previous nausea. - Start fluoxetine  10 mg daily. - Use lorazepam  0.5 mg as needed for acute anxiety, especially before travel. - Monitor response to medications and adjust as necessary.  Nausea Nausea associated with fluoxetine  use. Zofran  prescribed for management. - Use Zofran  4 mg as needed, up to every 8 hours. - Consider using 8 mg Zofran  if available and needed.  Head Pressure Pressure in frontal head area. MRI scheduled for further investigation. - Proceed with MRI of the brain and orbits on December 29, 2023. - Reassess symptoms after MRI results are available.  Post-Surgical Pain Management Concerns about post-surgical pain management. Advised to avoid ibuprofen  due to potential stomach issues but prefers it over acetaminophen . - Consider using acetaminophen  if ibuprofen  causes issues.     Return if symptoms worsen or fail to improve.   Subjective:    Patient ID: Taylor Singleton, female    DOB: 1955-11-18  Age: 68 y.o. MRN: [id number]  Chief Complaint  Patient presents with   Medical Management of Chronic Issues    Pt would like to discuss anxiety. She does not like Prozac .    HPI Discussed the use of AI scribe software for clinical note transcription with the patient, who gave verbal consent to proceed.  History of Present Illness        Taylor Singleton is a 68 year old female who presents with anxiety and nausea management. She has been experiencing ongoing anxiety and nausea, accompanied by pressure in her head. She consulted an ophthalmologist and visited the ER. The ophthalmologist found no issues with her eyes, and a CT scan at the ER was normal. Despite these  evaluations, she continues to feel anxious and nauseated. She was prescribed Prozac  20 mg, which she believes worsened her nausea, leading to a reduction in dosage to 10 mg. She has not yet started the lower dose. She was also given Ativan  (lorazepam ) 1 mg at the ER for anxiety, which she has not taken yet. She is confused about her medications, initially thinking she was on Xanax  instead of Ativan . Also thinking Zofran  was used for anxiety too.   She experiences nausea, particularly in the mornings, but has not been using Zofran  for relief. She notes that the Zofran  she received from urgent care was 8 mg, while she currently has 4 mg tablets. She has not been taking any medication for anxiety recently, which she attributes to her current nervousness. . She notes that the Zofran  she received from urgent care was 8 mg, while she currently has 4 mg tablets. She has not been taking any medication for anxiety recently, which she attributes to her current nervousness. She is scheduled for an MRI of the brain and orbits on July 17th. She mentions a planned Ortho surgery on August 12th, after which she will be taking 800 mg of ibuprofen . She is concerned about the interaction of ibuprofen  with her current medications. She has a history of taking Pepto Bismol, which led to black stools and a subsequent ER visit to rule out bleeding. She is currently not working at [employer] due to her upcoming surgery and has taken a leave of absence. She mentions spending time outdoors and on her porch, which she finds  enjoyable. Physical Exam Results RADIOLOGY CT scan: Normal Assessment & Plan Anxiety Anxiety likely related to recent medical evaluations and upcoming surgery. Lorazepam  discussed for acute relief; fluoxetine  recommended for long-term management. Advised starting lower fluoxetine  dose due to previous nausea. - Start fluoxetine  10 mg daily. - Use lorazepam  0.5 mg as needed for acute anxiety, especially before  travel. - Monitor response to medications and adjust as necessary.  Nausea Nausea associated with fluoxetine  use. Patient aware that Prozac  10mg  hopefully will be better tolerated. Zofran  prescribed for management. - Use Zofran  4 mg as needed, up to every 8 hours. - Consider using 8 mg Zofran  if available and needed.  Head and eye Pressure Pressure in frontal head area. MRI scheduled for further investigation. - Proceed with MRI of the brain and orbits on December 29, 2023. - Reassess symptoms after MRI results are available.  Post-Surgical Pain Management Concerns about post-surgical pain management. Advised to avoid ibuprofen  due to potential stomach issues but prefers it over acetaminophen . - Consider using acetaminophen     The 10-year ASCVD risk score (Arnett DK, et al., 2019) is: 8.6%  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    Varicose vein of leg    Wears glasses    Past Surgical History:  Procedure Laterality Date   ANTERIOR AND POSTERIOR REPAIR WITH SACROSPINOUS FIXATION N/A 07/19/2022   Procedure: POSTERIOR REPAIR WITHP PERINEORRHAPHY AND  SACROSPINOUS FIXATION;  Surgeon: Marilynne Rosaline SAILOR, MD;  Location: [facility];  Service: Gynecology;  Laterality: N/A;  Total time requested is 1.5hrs   BREAST CYST EXCISION Left    CHOLECYSTECTOMY     CHOLECYSTECTOMY, LAPAROSCOPIC  2010   COLONOSCOPY WITH PROPOFOL  N/A 03/22/2023   Procedure: COLONOSCOPY WITH PROPOFOL ;  Surgeon: Eartha Angelia Sieving, MD;  [facility];  Service: Gastroenterology;  Laterality: N/A;  8:45AM;ASA 1   TOTAL ABDOMINAL HYSTERECTOMY W/ BILATERAL SALPINGOOPHORECTOMY     1990s   Social History   Tobacco Use   Smoking status: Never    Smokeless tobacco: Never  Vaping Use   Vaping status: Never Used  Substance Use Topics   Alcohol use: No   Drug use: Never   Family History  Problem Relation Age of Onset   Diabetes Mother    Heart attack Brother    Breast cancer Neg Hx    No Known Allergies  ROS    Objective:    BP 124/74   Pulse 81   Temp 98.6 F (37 C)   Ht 5' 1 (1.549 m)   Wt 155 lb 4 oz (70.4 kg)   SpO2 98%   BMI 29.33 kg/m  BP Readings from Last 3 Encounters:  12/20/23 124/74  12/16/23 133/82  12/15/23 131/67   Wt Readings from Last 3 Encounters:  12/20/23 155 lb 4 oz (70.4 kg)  12/16/23 157 lb (71.2 kg)  12/15/23 157 lb (71.2 kg)    Physical Exam Vitals and nursing note reviewed.  Constitutional:      Appearance: Normal appearance.  HENT:     Head: Normocephalic.     Right Ear: Tympanic membrane, ear canal and external ear normal.     Left Ear: Tympanic membrane, ear canal and external ear  normal.  Eyes:     Extraocular Movements: Extraocular movements intact.     Pupils: Pupils are equal, round, and reactive to light.  Cardiovascular:     Rate and Rhythm: Normal rate and regular rhythm.     Heart sounds: Normal heart sounds.  Pulmonary:     Effort: Pulmonary effort is normal.     Breath sounds: Normal breath sounds. No wheezing.  Musculoskeletal:     Right lower leg: No edema.     Left lower leg: No edema.  Neurological:     General: No focal deficit present.     Mental Status: She is alert and oriented to person, place, and time.  Psychiatric:        Mood and Affect: Mood normal.        Behavior: Behavior normal.      No results found for any visits on 12/20/23.

## 2023-12-23 ENCOUNTER — Ambulatory Visit: Admitting: Family Medicine

## 2023-12-23 ENCOUNTER — Inpatient Hospital Stay: Admitting: Family Medicine

## 2023-12-23 VITALS — BP 119/64 | HR 97 | Temp 98.1°F | Ht 61.0 in | Wt 155.0 lb

## 2023-12-23 DIAGNOSIS — K5900 Constipation, unspecified: Secondary | ICD-10-CM

## 2023-12-23 DIAGNOSIS — F411 Generalized anxiety disorder: Secondary | ICD-10-CM

## 2023-12-23 MED ORDER — CLONAZEPAM 0.5 MG PO TABS: 0.5000 mg | ORAL_TABLET | Freq: Two times a day (BID) | ORAL | 1 refills | Status: DC | PRN

## 2023-12-23 MED ORDER — SERTRALINE HCL 50 MG PO TABS: 50.0000 mg | ORAL_TABLET | Freq: Every day | ORAL | 3 refills | Status: DC

## 2023-12-23 NOTE — Progress Notes (Signed)
 Subjective:    Patient ID: Taylor Singleton, female    DOB: 1955/09/20, 68 y.o.   MRN: [id number] 12/02/23 I saw the patient last fall.  In September, I started the patient on Lexapro  for generalized anxiety disorder.  She stopped the medication after 4 weeks due to nausea.  She cannot recall any benefit.  However the patient states that she is extremely anxious.  She worries about everything per her own report.  She also tends to fly off the handle at her husband.  She is afraid that she is going to say something and truly hurt him.  She denies panic attacks.  However she does report perseverating over worries and fears to a pathologic level.  This keeps her from enjoying life.  She is unable to let things go and relax.  We discussed whether she would benefit from Xanax  however she states that this is a daily phenomenon throughout the day.  Her most recent lab work is listed below and is excellent  However the patient is extremely concerned about her bilirubin being elevated.  This is the first time the bilirubin has ever been elevated at 1.4.  As indicative of her anxiety, she focuses on this.  At that time, my plan was: I believe the patient has generalized anxiety disorder.  She has tried and failed Lexapro .  Will go to try Prozac  20 mg a day and reassess in 6 weeks.  Meanwhile I recommended that she return next week and we check a total bilirubin, indirect bilirubin, and a direct bilirubin.  I suspect that this is likely lab error.  Will proceed with, based on the fractionation, we will workup causes of hyperbilirubinemia.  I tried to reassure the patient about her normal liver function test as well as her normal alkaline phosphatase.  Her hemoglobin is normal making hemolysis unlikely  12/23/23 Since I last saw the patient a month ago, she has been in the emergency room on 3 separate occasions and saw my partner twice for different medical concerns including nausea, constipation, headache.  Ultimately  my partner reduced the dose of Prozac  10 mg due to nausea.  She continues to experience nausea on a daily basis however the CAT scan in the emergency room showed moderate to severe constipation.  She is not having regular bowel movements.  I believe this is causing the majority of her bloating and nausea.  However I do believe the patient has become extremely hypervigilant because of her anxiety and now is extremely concerned about physical symptoms. 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    Varicose vein of leg    Wears glasses    Past Surgical History:  Procedure Laterality Date   ANTERIOR AND POSTERIOR REPAIR WITH SACROSPINOUS FIXATION N/A 07/19/2022   Procedure: POSTERIOR REPAIR WITHP PERINEORRHAPHY AND  SACROSPINOUS FIXATION;  Surgeon: Marilynne Rosaline SAILOR, MD;  Location: [facility];  Service: Gynecology;  Laterality: N/A;  Total time requested is 1.5hrs   BREAST CYST EXCISION Left    CHOLECYSTECTOMY     CHOLECYSTECTOMY, LAPAROSCOPIC  2010   COLONOSCOPY WITH PROPOFOL  N/A 03/22/2023   Procedure: COLONOSCOPY WITH PROPOFOL ;  Surgeon: Eartha Angelia Sieving, MD;  Location: [facility];  Service: Gastroenterology;  Laterality: N/A;  8:45AM;ASA 1   TOTAL ABDOMINAL HYSTERECTOMY W/ BILATERAL SALPINGOOPHORECTOMY     1990s   Current Outpatient Medications on File Prior to Visit  Medication Sig Dispense Refill   aspirin  EC 325 MG tablet Take 1 tablet (325 mg total) by mouth daily. 14 tablet 0   atorvastatin  (LIPITOR) 40 MG tablet Take 1 tablet (40 mg total) by mouth daily. 90 tablet 2   Calcium  Carb-Cholecalciferol (CALCIUM /VITAMIN D) 600-400 MG-UNIT TABS Take 2 tablets by mouth daily.     FLUoxetine  (PROZAC ) 10 MG capsule Take 1  capsule (10 mg total) by mouth daily. 90 capsule 1   LORazepam  (ATIVAN ) 1 MG tablet Take 1 tablet (1 mg total) by mouth 3 (three) times daily as needed for anxiety. 15 tablet 0   ondansetron  (ZOFRAN -ODT) 4 MG disintegrating tablet Take 1 tablet (4 mg total) by mouth every 8 (eight) hours as needed for nausea or vomiting. 30 tablet 0   oxybutynin  (DITROPAN  XL) 5 MG 24 hr tablet Take 1 tablet (5 mg total) by mouth at bedtime. 30 tablet 11   oxyCODONE  (ROXICODONE ) 5 MG immediate release tablet Take 1 tablet (5 mg total) by mouth every 4 (four) hours as needed for severe pain (pain score 7-10) or breakthrough pain. 30 tablet 0   pantoprazole  (PROTONIX ) 40 MG tablet Take 1 tablet (40 mg total) by mouth daily. 90 tablet 2   No current facility-administered medications on file prior to visit.   No Known Allergies Social History   Socioeconomic History   Marital status: Married    Spouse name: Not on file   Number of children: 2   Years of education: Not on file   Highest education level: Not on file  Occupational History   Not on file  Tobacco Use   Smoking status: Never   Smokeless tobacco: Never  Vaping Use   Vaping status: Never Used  Substance and Sexual Activity   Alcohol use: No   Drug use: Never   Sexual activity: Not Currently    Birth control/protection: Surgical    Comment: hyst  Other Topics Concern   Not on file  Social History Narrative   Not on file   Social Drivers of Health   Financial Resource Strain: Low Risk  (11/24/2023)   Overall Financial Resource Strain (CARDIA)    Difficulty of Paying Living Expenses: Not hard at all  Food Insecurity: No Food Insecurity (11/24/2023)   Hunger Vital Sign    Worried About Running Out of Food in the Last Year: Never true    Ran Out of Food in the Last Year: Never true  Transportation Needs: No Transportation Needs (11/24/2023)   PRAPARE - Administrator, Civil Service (Medical): No    Lack of Transportation  (Non-Medical): No  Physical Activity: Inactive (11/24/2023)   Exercise Vital Sign    Days of Exercise per Week: 0 days    Minutes of Exercise per Session: 0 min  Stress: No Stress Concern Present (11/24/2023)   [employer] of Occupational Health - Occupational Stress Questionnaire    Feeling of Stress: Not at all  Social Connections: Moderately Isolated (11/24/2023)   Social Connection and Isolation Panel    Frequency of Communication with Friends and Family: Three times a week    Frequency of Social Gatherings with Friends and Family: More than three times a week    Attends Religious Services: Never    Database administrator or Organizations:  No    Attends Club or Organization Meetings: Never    Marital Status: Married  Catering manager Violence: Not At Risk (11/24/2023)   Humiliation, Afraid, Rape, and Kick questionnaire    Fear of Current or Ex-Partner: No    Emotionally Abused: No    Physically Abused: No    Sexually Abused: No     Review of Systems  All other systems reviewed and are negative.      Objective:   Physical Exam Constitutional:      General: She is not in acute distress.    Appearance: Normal appearance. She is well-developed and normal weight. She is not ill-appearing or toxic-appearing.  HENT:     Right Ear: Tympanic membrane and ear canal normal.     Left Ear: Tympanic membrane and ear canal normal.     Nose: Nose normal.     Mouth/Throat:     Mouth: Mucous membranes are moist.     Pharynx: Oropharynx is clear. No oropharyngeal exudate or posterior oropharyngeal erythema.  Eyes:     Extraocular Movements: Extraocular movements intact.     Conjunctiva/sclera: Conjunctivae normal.     Pupils: Pupils are equal, round, and reactive to light.  Cardiovascular:     Rate and Rhythm: Normal rate and regular rhythm.     Heart sounds: Normal heart sounds.  Pulmonary:     Effort: Pulmonary effort is normal. No respiratory distress.     Breath sounds: No  wheezing or rales.  Musculoskeletal:     Cervical back: Neck supple.  Lymphadenopathy:     Cervical: No cervical adenopathy.  Skin:    Findings: No rash.  Neurological:     Mental Status: She is alert and oriented to person, place, and time.     Cranial Nerves: No cranial nerve deficit, dysarthria or facial asymmetry.     Sensory: No sensory deficit.     Motor: No weakness, tremor, atrophy, abnormal muscle tone or seizure activity.     Coordination: Romberg sign negative. Coordination normal. Finger-Nose-Finger Test normal.     Gait: Gait normal.     Deep Tendon Reflexes: Reflexes normal.  Psychiatric:        Mood and Affect: Mood normal.        Behavior: Behavior normal.        Thought Content: Thought content normal.        Judgment: Judgment normal.           Assessment & Plan:  GAD (generalized anxiety disorder)  Constipation, unspecified constipation type Patient cannot tolerate Prozac .  Replace Prozac  with Zoloft  50 mg a day.  Begin Klonopin  0.5 mg twice daily on a scheduled basis to try to get some control over her anxiety.  Start Linzess 290 mcg daily for chronic constipation.  Reassess in 4 weeks or sooner if worsening.

## 2023-12-28 ENCOUNTER — Ambulatory Visit: Payer: Self-pay

## 2023-12-28 NOTE — Telephone Encounter (Signed)
 Spoke with patient. Pt states she also started Zoloft  the same day she started the Linzess and pt wanted to know if that could be contributing to the diarrhea? Pt also states, if you agree, to stop all of the new medications until after her surgery on 8/12. Thanks.   Hi Taylor Singleton,   I just left you a voice mail.  Probably the best thing to do would be to hold the Linzess until the diarrhea completely stops. I feel like both the Magnesium Citrate and the Linzess was too much for your system. I am going to forward this to Dr. Duanne to see if he has any additional information for you and will let you know. Thank you!   Taylor Singleton

## 2023-12-28 NOTE — Telephone Encounter (Signed)
 FYI Only or Action Required?: Action required by provider: clinical question for provider and update on patient condition.  Patient was last seen in primary care on 12/23/2023 by Duanne Butler DASEN, MD.  Called Nurse Triage reporting Diarrhea.  Symptoms began several days ago.  Interventions attempted: Rest, hydration, or home remedies.  Symptoms are: gradually worsening.  Triage Disposition: Call PCP Now  Patient/caregiver understands and will follow disposition?: Yes      Copied from CRM [id number]. Topic: Clinical - Red Word Triage >> Dec 28, 2023 12:55 PM Jayma L wrote: Red Word that prompted transfer to Nurse Triage: patient called in and stated she was constipated so she started taking magnesium on Sunday. She said she drank all of it and it started working she was able to start going to bathroom again and then on Monday , Tuesday and into today she's have worsen diarrhea. Said she's scared to leave the house its so bad Reason for Disposition  [1] Caller has URGENT medicine question about med that primary care doctor (or NP/PA) or specialist prescribed AND [2] triager unable to answer question  Answer Assessment - Initial Assessment Questions 1. NAME of MEDICINE: What medicine(s) are you calling about?     Linzess 290 mcg 2. QUESTION: What is your question? (e.g., double dose of medicine, side effect)     Side effect 3. PRESCRIBER: Who prescribed the medicine? Reason: if prescribed by specialist, call should be referred to that group.     [place] 4. SYMPTOMS: Do you have any symptoms? If Yes, ask: What symptoms are you having?  How bad are the symptoms (e.g., mild, moderate, severe)     Diarrhea- states that she is scared to leave the house do to the diarrhea.- states that she took a mag citrate on 12/25/23 and also started on the linzess for chronic constipation  on 12/26/23 and now is having diarrhea, and having 4-5 stools.  Protocols used: Medication Question  Call-A-AH

## 2023-12-28 NOTE — Telephone Encounter (Signed)
 FYI Only or Action Required?: Action required by provider: please call pt.  Patient was last seen in primary care on 12/23/2023 by Duanne Butler DASEN, MD.  Called Nurse Triage reporting Advice Only.  Triage Disposition: Information or Advice Only Call  Patient/caregiver understands and will follow disposition?: Yes            Copied from CRM [id number]. Topic: Clinical - Red Word Triage >> Dec 28, 2023  2:19 PM Silvana PARAS wrote: Red Word that prompted transfer to Nurse Triage: Pt calling to f/u with last nurse. patient called in and stated she was constipated so she started taking magnesium on Sunday. She said she drank all of it and it started working she was able to start going to bathroom again and then on Monday , Tuesday and into today she's have worsen diarrhea. Said she's scared to leave the house its so bad   Reason for Disposition  General information question, no triage required and triager able to answer question  Answer Assessment - Initial Assessment Questions 1. REASON FOR CALL: What is the main reason for your call? or How can I best help you?    Patient stated she was attempting to return a call from Ronal Bradley at the office.  Patient informed that she had called in earlier & spoke with nurse r/t constipation, etc., as noted in chart/NT- however now she was attempting to return call to office for Ronal Bradley & the lady transferred the call to me.  Nurse reached out to CAL & spoke with Delon to attempt to reach Ronal Bradley.  Nurse was informed Ronal Bradley not available right now, have pt wait by the phone & Ronal Bradley will call her back shortly.  Nurse informed pt: pt verbalized understanding.  Protocols used: Information Only Call - No Triage-A-AH

## 2023-12-29 ENCOUNTER — Other Ambulatory Visit

## 2023-12-29 ENCOUNTER — Telehealth (HOSPITAL_BASED_OUTPATIENT_CLINIC_OR_DEPARTMENT_OTHER): Payer: Self-pay | Admitting: Orthopaedic Surgery

## 2023-12-29 NOTE — Telephone Encounter (Signed)
 Patient wants to know if her surgery has actually bee approved? Please contact patient [phone]

## 2023-12-30 ENCOUNTER — Ambulatory Visit: Admitting: Family Medicine

## 2024-01-12 ENCOUNTER — Ambulatory Visit
Admission: RE | Admit: 2024-01-12 | Discharge: 2024-01-12 | Disposition: A | Discharge status: Home / Self Care | Source: Ambulatory Visit | Attending: Neurology

## 2024-01-12 DIAGNOSIS — R42 Dizziness and giddiness: Secondary | ICD-10-CM | POA: Diagnosis not present

## 2024-01-12 DIAGNOSIS — H5713 Ocular pain, bilateral: Secondary | ICD-10-CM

## 2024-01-12 DIAGNOSIS — H579 Unspecified disorder of eye and adnexa: Secondary | ICD-10-CM

## 2024-01-12 DIAGNOSIS — R519 Headache, unspecified: Secondary | ICD-10-CM

## 2024-01-12 MED ORDER — GADOPICLENOL 0.5 MMOL/ML IV SOLN
7.0000 mL | Freq: Once | INTRAVENOUS | Status: AC | PRN
  Administered 2024-01-12: 7 mL via INTRAVENOUS

## 2024-01-13 ENCOUNTER — Ambulatory Visit: Admitting: Family Medicine

## 2024-01-13 VITALS — BP 120/70 | HR 82 | Temp 99.3°F | Ht 61.0 in | Wt 154.6 lb

## 2024-01-13 DIAGNOSIS — F411 Generalized anxiety disorder: Secondary | ICD-10-CM | POA: Diagnosis not present

## 2024-01-13 NOTE — Progress Notes (Signed)
 Subjective:    Patient ID: Taylor Singleton, female    DOB: November 20, 1955, 68 y.o.   MRN: [id number] 12/02/23 I saw the patient last fall.  In September, I started the patient on Lexapro  for generalized anxiety disorder.  She stopped the medication after 4 weeks due to nausea.  She cannot recall any benefit.  However the patient states that she is extremely anxious.  She worries about everything per her own report.  She also tends to fly off the handle at her husband.  She is afraid that she is going to say something and truly hurt him.  She denies panic attacks.  However she does report perseverating over worries and fears to a pathologic level.  This keeps her from enjoying life.  She is unable to let things go and relax.  We discussed whether she would benefit from Xanax  however she states that this is a daily phenomenon throughout the day.  Her most recent lab work is listed below and is excellent  However the patient is extremely concerned about her bilirubin being elevated.  This is the first time the bilirubin has ever been elevated at 1.4.  As indicative of her anxiety, she focuses on this.  At that time, my plan was: I believe the patient has generalized anxiety disorder.  She has tried and failed Lexapro .  Will go to try Prozac  20 mg a day and reassess in 6 weeks.  Meanwhile I recommended that she return next week and we check a total bilirubin, indirect bilirubin, and a direct bilirubin.  I suspect that this is likely lab error.  Will proceed with, based on the fractionation, we will workup causes of hyperbilirubinemia.  I tried to reassure the patient about her normal liver function test as well as her normal alkaline phosphatase.  Her hemoglobin is normal making hemolysis unlikely  12/23/23 Since I last saw the patient a month ago, she has been in the emergency room on 3 separate occasions and saw my partner twice for different medical concerns including nausea, constipation, headache.   Ultimately my partner reduced the dose of Prozac  10 mg due to nausea.  She continues to experience nausea on a daily basis however the CAT scan in the emergency room showed moderate to severe constipation.  She is not having regular bowel movements.  I believe this is causing the majority of her bloating and nausea.  However I do believe the patient has become extremely hypervigilant because of her anxiety and now is extremely concerned about physical symptoms.  At that time, my plan was: Patient cannot tolerate Prozac .  Replace Prozac  with Zoloft  50 mg a day.  Begin Klonopin  0.5 mg twice daily on a scheduled basis to try to get some control over her anxiety.  Start Linzess 290 mcg daily for chronic constipation.  Reassess in 4 weeks or sooner if worsening.  01/13/24 Patient is taken the Zoloft  for 12 days.  She reports nausea every morning when she wakes up.  However she feels like she can handle this at the present time.  She is not taking Klonopin .  She took magnesium 6 and was able to evacuate her stool.  She never started Linzess.  Instead she started over-the-counter psyllium at the recommendation of her family.   Past Medical History:  Diagnosis Date  . Arthritis   . Chronic idiopathic constipation   . GERD (gastroesophageal reflux disease)   . HLD (hyperlipidemia)   . Hyperlipidemia   . Osteoporosis   .  Prolapse of posterior vaginal wall   . Sciatica   . Vaginal vault 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   . Varicose vein of leg   . Wears glasses    Past Surgical History:  Procedure Laterality Date  . ANTERIOR AND POSTERIOR REPAIR WITH SACROSPINOUS FIXATION N/A 07/19/2022   Procedure: POSTERIOR REPAIR WITHP PERINEORRHAPHY AND  SACROSPINOUS FIXATION;  Surgeon: Marilynne Rosaline SAILOR, MD;  Location: [facility];  Service: Gynecology;  Laterality: N/A;  Total time requested is 1.5hrs  . BREAST CYST EXCISION Left   . CHOLECYSTECTOMY    . CHOLECYSTECTOMY, LAPAROSCOPIC  2010  .  COLONOSCOPY WITH PROPOFOL  N/A 03/22/2023   Procedure: COLONOSCOPY WITH PROPOFOL ;  Surgeon: Eartha Angelia Sieving, MD;  Location: [facility];  Service: Gastroenterology;  Laterality: N/A;  8:45AM;ASA 1  . TOTAL ABDOMINAL HYSTERECTOMY W/ BILATERAL SALPINGOOPHORECTOMY     1990s   Current Outpatient Medications on File Prior to Visit  Medication Sig Dispense Refill  . atorvastatin  (LIPITOR) 40 MG tablet Take 1 tablet (40 mg total) by mouth daily. 90 tablet 2  . Calcium  Carb-Cholecalciferol (CALCIUM /VITAMIN D) 600-400 MG-UNIT TABS Take 2 tablets by mouth daily.    . clonazePAM  (KLONOPIN ) 0.5 MG tablet Take 1 tablet (0.5 mg total) by mouth 2 (two) times daily as needed for anxiety. 20 tablet 1  . pantoprazole  (PROTONIX ) 40 MG tablet Take 1 tablet (40 mg total) by mouth daily. 90 tablet 2  . sertraline  (ZOLOFT ) 50 MG tablet Take 1 tablet (50 mg total) by mouth daily. 30 tablet 3  . aspirin  EC 325 MG tablet Take 1 tablet (325 mg total) by mouth daily. (Patient not taking: Reported on 01/13/2024) 14 tablet 0  . ondansetron  (ZOFRAN -ODT) 4 MG disintegrating tablet Take 1 tablet (4 mg total) by mouth every 8 (eight) hours as needed for nausea or vomiting. (Patient not taking: Reported on 01/13/2024) 30 tablet 0  . oxybutynin  (DITROPAN  XL) 5 MG 24 hr tablet Take 1 tablet (5 mg total) by mouth at bedtime. (Patient not taking: Reported on 01/13/2024) 30 tablet 11  . oxyCODONE  (ROXICODONE ) 5 MG immediate release tablet Take 1 tablet (5 mg total) by mouth every 4 (four) hours as needed for severe pain (pain score 7-10) or breakthrough pain. (Patient not taking: Reported on 01/13/2024) 30 tablet 0   No current facility-administered medications on file prior to visit.   No Known Allergies Social History   Socioeconomic History  . Marital status: Married    Spouse name: Not on file  . Number of children: 2  . Years of education: Not on file  . Highest education level: Not on file  Occupational History  . Not  on file  Tobacco Use  . Smoking status: Never  . Smokeless tobacco: Never  Vaping Use  . Vaping status: Never Used  Substance and Sexual Activity  . Alcohol use: No  . Drug use: Never  . Sexual activity: Not Currently    Birth control/protection: Surgical    Comment: hyst  Other Topics Concern  . Not on file  Social History Narrative  . Not on file   Social Drivers of Health   Financial Resource Strain: Low Risk  (11/24/2023)   Overall Financial Resource Strain (CARDIA)   . Difficulty of Paying Living Expenses: Not hard at all  Food Insecurity: No Food Insecurity (11/24/2023)   Hunger Vital Sign   . Worried About Programme researcher, broadcasting/film/video in the Last Year: Never true   . Ran Out of  Food in the Last Year: Never true  Transportation Needs: No Transportation Needs (11/24/2023)   PRAPARE - Transportation   . Lack of Transportation (Medical): No   . Lack of Transportation (Non-Medical): No  Physical Activity: Inactive (11/24/2023)   Exercise Vital Sign   . Days of Exercise per Week: 0 days   . Minutes of Exercise per Session: 0 min  Stress: No Stress Concern Present (11/24/2023)   [employer] of Occupational Health - Occupational Stress Questionnaire   . Feeling of Stress: Not at all  Social Connections: Moderately Isolated (11/24/2023)   Social Connection and Isolation Panel   . Frequency of Communication with Friends and Family: Three times a week   . Frequency of Social Gatherings with Friends and Family: More than three times a week   . Attends Religious Services: Never   . Active Member of Clubs or Organizations: No   . Attends Banker Meetings: Never   . Marital Status: Married  Catering manager Violence: Not At Risk (11/24/2023)   Humiliation, Afraid, Rape, and Kick questionnaire   . Fear of Current or Ex-Partner: No   . Emotionally Abused: No   . Physically Abused: No   . Sexually Abused: No     Review of Systems  All other systems reviewed and are  negative.      Objective:   Physical Exam Constitutional:      General: She is not in acute distress.    Appearance: Normal appearance. She is well-developed and normal weight. She is not ill-appearing or toxic-appearing.  Cardiovascular:     Rate and Rhythm: Normal rate and regular rhythm.     Heart sounds: Normal heart sounds.  Pulmonary:     Effort: Pulmonary effort is normal. No respiratory distress.     Breath sounds: No wheezing or rales.  Musculoskeletal:     Cervical back: Neck supple.  Lymphadenopathy:     Cervical: No cervical adenopathy.  Skin:    Findings: No rash.  Neurological:     Mental Status: She is alert and oriented to person, place, and time.     Cranial Nerves: No dysarthria or facial asymmetry.     Motor: No tremor, atrophy, abnormal muscle tone or seizure activity.     Coordination: Romberg sign negative. Finger-Nose-Finger Test normal.  Psychiatric:        Mood and Affect: Mood normal.        Behavior: Behavior normal.        Thought Content: Thought content normal.        Judgment: Judgment normal.           Assessment & Plan:  GAD (generalized anxiety disorder) I strongly encouraged the patient to stick with the Zoloft .  I truly feel that if she can make it through the first 4 to 6 weeks, her anxiety will improve.  I believe that this will also help her overall quality of life.  She has chosen not to take Klonopin .  She is also chosen not to take linzess.  I feel that psyllium is an excellent over-the-counter choice.

## 2024-01-17 ENCOUNTER — Encounter (HOSPITAL_BASED_OUTPATIENT_CLINIC_OR_DEPARTMENT_OTHER): Payer: Self-pay | Admitting: Orthopaedic Surgery

## 2024-01-17 ENCOUNTER — Other Ambulatory Visit: Payer: Self-pay

## 2024-01-18 ENCOUNTER — Ambulatory Visit: Admitting: Obstetrics & Gynecology

## 2024-01-20 ENCOUNTER — Ambulatory Visit: Admitting: Family Medicine

## 2024-01-24 ENCOUNTER — Ambulatory Visit (HOSPITAL_BASED_OUTPATIENT_CLINIC_OR_DEPARTMENT_OTHER): Payer: Self-pay | Admitting: Certified Registered Nurse Anesthetist

## 2024-01-24 ENCOUNTER — Encounter (HOSPITAL_BASED_OUTPATIENT_CLINIC_OR_DEPARTMENT_OTHER): Payer: Self-pay | Admitting: Orthopaedic Surgery

## 2024-01-24 ENCOUNTER — Ambulatory Visit (HOSPITAL_BASED_OUTPATIENT_CLINIC_OR_DEPARTMENT_OTHER)
Admission: RE | Admit: 2024-01-24 | Discharge: 2024-01-24 | Disposition: A | Discharge status: Home / Self Care | Attending: Orthopaedic Surgery | Admitting: Orthopaedic Surgery

## 2024-01-24 ENCOUNTER — Other Ambulatory Visit: Payer: Self-pay

## 2024-01-24 ENCOUNTER — Encounter (HOSPITAL_BASED_OUTPATIENT_CLINIC_OR_DEPARTMENT_OTHER)
Admission: RE | Disposition: A | Discharge status: Home / Self Care | Payer: Self-pay | Source: Home / Self Care | Attending: Orthopaedic Surgery

## 2024-01-24 DIAGNOSIS — S76012D Strain of muscle, fascia and tendon of left hip, subsequent encounter: Secondary | ICD-10-CM | POA: Diagnosis not present

## 2024-01-24 DIAGNOSIS — E785 Hyperlipidemia, unspecified: Secondary | ICD-10-CM | POA: Diagnosis not present

## 2024-01-24 DIAGNOSIS — M810 Age-related osteoporosis without current pathological fracture: Secondary | ICD-10-CM | POA: Diagnosis not present

## 2024-01-24 DIAGNOSIS — X58XXXD Exposure to other specified factors, subsequent encounter: Secondary | ICD-10-CM | POA: Insufficient documentation

## 2024-01-24 DIAGNOSIS — S76012A Strain of muscle, fascia and tendon of left hip, initial encounter: Secondary | ICD-10-CM

## 2024-01-24 DIAGNOSIS — Z01818 Encounter for other preprocedural examination: Secondary | ICD-10-CM

## 2024-01-24 DIAGNOSIS — K219 Gastro-esophageal reflux disease without esophagitis: Secondary | ICD-10-CM | POA: Diagnosis not present

## 2024-01-24 HISTORY — PX: GLUTEUS MINIMUS REPAIR: SHX5843

## 2024-01-24 HISTORY — DX: Anxiety disorder, unspecified: F41.9

## 2024-01-24 SURGERY — REPAIR, TENDON, GLUTEUS MINIMUS
Anesthesia: General | Site: Buttocks | Laterality: Left

## 2024-01-24 MED ORDER — DEXAMETHASONE SODIUM PHOSPHATE 10 MG/ML IJ SOLN
INTRAMUSCULAR | Status: DC | PRN
Start: 2024-01-24 — End: 2024-01-24
  Administered 2024-01-24 (×2): 5 mg via INTRAVENOUS

## 2024-01-24 MED ORDER — ROCURONIUM BROMIDE 10 MG/ML (PF) SYRINGE
PREFILLED_SYRINGE | INTRAVENOUS | Status: AC
  Filled 2024-01-24: qty 10

## 2024-01-24 MED ORDER — GLYCOPYRROLATE PF 0.2 MG/ML IJ SOSY
PREFILLED_SYRINGE | INTRAMUSCULAR | Status: AC
  Filled 2024-01-24: qty 1

## 2024-01-24 MED ORDER — PROPOFOL 10 MG/ML IV BOLUS
INTRAVENOUS | Status: DC | PRN
  Administered 2024-01-24 (×2): 150 mg via INTRAVENOUS
  Administered 2024-01-24 (×2): 50 mg via INTRAVENOUS

## 2024-01-24 MED ORDER — ACETAMINOPHEN 500 MG PO TABS
1000.0000 mg | ORAL_TABLET | Freq: Once | ORAL | Status: AC
  Administered 2024-01-24 (×2): 1000 mg via ORAL

## 2024-01-24 MED ORDER — ROCURONIUM BROMIDE 100 MG/10ML IV SOLN
INTRAVENOUS | Status: DC | PRN
Start: 2024-01-24 — End: 2024-01-24
  Administered 2024-01-24 (×2): 50 mg via INTRAVENOUS

## 2024-01-24 MED ORDER — BUPIVACAINE HCL 0.25 % IJ SOLN
INTRAMUSCULAR | Status: DC | PRN
  Administered 2024-01-24 (×2): 20 mL

## 2024-01-24 MED ORDER — LIDOCAINE 2% (20 MG/ML) 5 ML SYRINGE
INTRAMUSCULAR | Status: AC
Start: 2024-01-24 — End: 2024-01-24
  Filled 2024-01-24: qty 5

## 2024-01-24 MED ORDER — GLYCOPYRROLATE 0.2 MG/ML IJ SOLN
INTRAMUSCULAR | Status: DC | PRN
Start: 2024-01-24 — End: 2024-01-24
  Administered 2024-01-24 (×2): .1 mg via INTRAVENOUS

## 2024-01-24 MED ORDER — ARTIFICIAL TEARS OPHTHALMIC OINT
TOPICAL_OINTMENT | OPHTHALMIC | Status: DC | PRN
Start: 2024-01-24 — End: 2024-01-24
  Administered 2024-01-24 (×2): 1 via OPHTHALMIC

## 2024-01-24 MED ORDER — TRANEXAMIC ACID-NACL 1000-0.7 MG/100ML-% IV SOLN
1000.0000 mg | INTRAVENOUS | Status: AC
  Administered 2024-01-24 (×2): 1000 mg via INTRAVENOUS

## 2024-01-24 MED ORDER — ACETAMINOPHEN 500 MG PO TABS
ORAL_TABLET | ORAL | Status: AC
  Filled 2024-01-24: qty 2

## 2024-01-24 MED ORDER — FENTANYL CITRATE (PF) 100 MCG/2ML IJ SOLN
INTRAMUSCULAR | Status: AC
Start: 2024-01-24 — End: 2024-01-24
  Filled 2024-01-24: qty 2

## 2024-01-24 MED ORDER — ONDANSETRON HCL 4 MG/2ML IJ SOLN
INTRAMUSCULAR | Status: AC
  Filled 2024-01-24: qty 2

## 2024-01-24 MED ORDER — FENTANYL CITRATE (PF) 100 MCG/2ML IJ SOLN
25.0000 ug | INTRAMUSCULAR | Status: DC | PRN
  Administered 2024-01-24 (×2): 50 ug via INTRAVENOUS

## 2024-01-24 MED ORDER — FENTANYL CITRATE (PF) 100 MCG/2ML IJ SOLN
INTRAMUSCULAR | Status: AC
  Filled 2024-01-24: qty 2

## 2024-01-24 MED ORDER — ACETAMINOPHEN 500 MG PO TABS: 1000.0000 mg | ORAL_TABLET | Freq: Once | ORAL | Status: AC

## 2024-01-24 MED ORDER — MIDAZOLAM HCL 2 MG/2ML IJ SOLN
INTRAMUSCULAR | Status: AC
  Filled 2024-01-24: qty 2

## 2024-01-24 MED ORDER — ONDANSETRON HCL 4 MG/2ML IJ SOLN
INTRAMUSCULAR | Status: DC | PRN
Start: 2024-01-24 — End: 2024-01-24
  Administered 2024-01-24 (×2): 4 mg via INTRAVENOUS

## 2024-01-24 MED ORDER — GABAPENTIN 300 MG PO CAPS
ORAL_CAPSULE | ORAL | Status: AC
  Filled 2024-01-24: qty 1

## 2024-01-24 MED ORDER — ONDANSETRON HCL 4 MG/2ML IJ SOLN: 4.0000 mg | Freq: Once | INTRAMUSCULAR | Status: DC | PRN

## 2024-01-24 MED ORDER — METOPROLOL TARTRATE 5 MG/5ML IV SOLN
INTRAVENOUS | Status: AC
  Filled 2024-01-24: qty 5

## 2024-01-24 MED ORDER — METOPROLOL TARTRATE 5 MG/5ML IV SOLN
INTRAVENOUS | Status: DC | PRN
  Administered 2024-01-24 (×2): 1 mg via INTRAVENOUS

## 2024-01-24 MED ORDER — LACTATED RINGERS IV SOLN: INTRAVENOUS | Status: DC

## 2024-01-24 MED ORDER — OXYCODONE HCL 5 MG PO TABS
5.0000 mg | ORAL_TABLET | Freq: Once | ORAL | Status: AC | PRN
  Administered 2024-01-24 (×2): 5 mg via ORAL

## 2024-01-24 MED ORDER — OXYCODONE HCL 5 MG PO TABS
ORAL_TABLET | ORAL | Status: AC
  Filled 2024-01-24: qty 1

## 2024-01-24 MED ORDER — PROPOFOL 10 MG/ML IV BOLUS
INTRAVENOUS | Status: AC
  Filled 2024-01-24: qty 20

## 2024-01-24 MED ORDER — SUGAMMADEX SODIUM 200 MG/2ML IV SOLN
INTRAVENOUS | Status: DC | PRN
  Administered 2024-01-24 (×2): 140 mg via INTRAVENOUS

## 2024-01-24 MED ORDER — CEFAZOLIN SODIUM-DEXTROSE 2-4 GM/100ML-% IV SOLN
INTRAVENOUS | Status: AC
  Filled 2024-01-24: qty 100

## 2024-01-24 MED ORDER — TRANEXAMIC ACID-NACL 1000-0.7 MG/100ML-% IV SOLN
INTRAVENOUS | Status: AC
  Filled 2024-01-24: qty 100

## 2024-01-24 MED ORDER — CEFAZOLIN SODIUM-DEXTROSE 2-4 GM/100ML-% IV SOLN
2.0000 g | INTRAVENOUS | Status: AC
  Administered 2024-01-24 (×2): 2 g via INTRAVENOUS

## 2024-01-24 MED ORDER — 0.9 % SODIUM CHLORIDE (POUR BTL) OPTIME
TOPICAL | Status: DC | PRN
  Administered 2024-01-24 (×2): 200 mL

## 2024-01-24 MED ORDER — OXYCODONE HCL 5 MG/5ML PO SOLN: 5.0000 mg | Freq: Once | ORAL | Status: AC | PRN

## 2024-01-24 MED ORDER — FENTANYL CITRATE (PF) 100 MCG/2ML IJ SOLN
INTRAMUSCULAR | Status: DC | PRN
  Administered 2024-01-24 (×4): 50 ug via INTRAVENOUS

## 2024-01-24 MED ORDER — LIDOCAINE HCL (CARDIAC) PF 100 MG/5ML IV SOSY
PREFILLED_SYRINGE | INTRAVENOUS | Status: DC | PRN
  Administered 2024-01-24 (×2): 60 mg via INTRATRACHEAL

## 2024-01-24 MED ORDER — DEXAMETHASONE SODIUM PHOSPHATE 10 MG/ML IJ SOLN
INTRAMUSCULAR | Status: AC
  Filled 2024-01-24: qty 1

## 2024-01-24 MED ORDER — ARTIFICIAL TEARS OPHTHALMIC OINT
TOPICAL_OINTMENT | OPHTHALMIC | Status: AC
  Filled 2024-01-24: qty 3.5

## 2024-01-24 MED ORDER — GABAPENTIN 300 MG PO CAPS
300.0000 mg | ORAL_CAPSULE | Freq: Once | ORAL | Status: AC
  Administered 2024-01-24 (×2): 300 mg via ORAL

## 2024-01-24 MED ORDER — MIDAZOLAM HCL 2 MG/2ML IJ SOLN
INTRAMUSCULAR | Status: DC | PRN
  Administered 2024-01-24 (×2): 2 mg via INTRAVENOUS

## 2024-01-24 SURGICAL SUPPLY — 46 items
ANCHOR JUGGERKNOT SOFT 2.9 (Anchor) IMPLANT
ANCHOR SUT QUATTRO KNTLS 4.5 (Anchor) IMPLANT
BLADE SURG 10 STRL SS (BLADE) ×1 IMPLANT
BLADE SURG 15 STRL LF DISP TIS (BLADE) ×1 IMPLANT
CANISTER SUCT 1200ML W/VALVE (MISCELLANEOUS) ×1 IMPLANT
CHLORAPREP W/TINT 26 (MISCELLANEOUS) ×1 IMPLANT
CLSR STERI-STRIP ANTIMIC 1/2X4 (GAUZE/BANDAGES/DRESSINGS) IMPLANT
COOLER ICEMAN CLASSIC (MISCELLANEOUS) ×1 IMPLANT
COVER BACK TABLE 60X90IN (DRAPES) ×1 IMPLANT
COVER MAYO STAND STRL (DRAPES) ×1 IMPLANT
DERMABOND ADVANCED .7 DNX12 (GAUZE/BANDAGES/DRESSINGS) IMPLANT
DRAPE STERI IOBAN 125X83 (DRAPES) ×1 IMPLANT
DRAPE U-SHAPE 47X51 STRL (DRAPES) ×2 IMPLANT
DRSG AQUACEL AG ADV 3.5X 6 (GAUZE/BANDAGES/DRESSINGS) ×1 IMPLANT
DRSG AQUACEL AG ADV 3.5X10 (GAUZE/BANDAGES/DRESSINGS) IMPLANT
ELECTRODE BLDE 4.0 EZ CLN MEGD (MISCELLANEOUS) IMPLANT
ELECTRODE REM PT RTRN 9FT ADLT (ELECTROSURGICAL) ×1 IMPLANT
GAUZE PAD ABD 8X10 STRL (GAUZE/BANDAGES/DRESSINGS) IMPLANT
GAUZE XEROFORM 1X8 LF (GAUZE/BANDAGES/DRESSINGS) IMPLANT
GLOVE BIO SURGEON STRL SZ 6 (GLOVE) ×2 IMPLANT
GLOVE BIO SURGEON STRL SZ7.5 (GLOVE) ×2 IMPLANT
GLOVE BIOGEL PI IND STRL 6.5 (GLOVE) ×1 IMPLANT
GLOVE BIOGEL PI IND STRL 8 (GLOVE) ×1 IMPLANT
GOWN STRL REUS W/ TWL LRG LVL3 (GOWN DISPOSABLE) ×2 IMPLANT
GOWN STRL REUS W/TWL XL LVL3 (GOWN DISPOSABLE) ×1 IMPLANT
MANIFOLD NEPTUNE II (INSTRUMENTS) ×1 IMPLANT
NDL HYPO 22X1.5 SAFETY MO (MISCELLANEOUS) ×1 IMPLANT
NEEDLE HYPO 22X1.5 SAFETY MO (MISCELLANEOUS) ×1 IMPLANT
NS IRRIG 1000ML POUR BTL (IV SOLUTION) ×1 IMPLANT
PACK BASIN DAY SURGERY FS (CUSTOM PROCEDURE TRAY) ×1 IMPLANT
PAD COLD SHLDR WRAP-ON (PAD) ×1 IMPLANT
PENCIL SMOKE EVACUATOR (MISCELLANEOUS) ×1 IMPLANT
SLEEVE SCD COMPRESS KNEE MED (STOCKING) ×1 IMPLANT
SPIKE FLUID TRANSFER (MISCELLANEOUS) IMPLANT
SPONGE T-LAP 18X18 ~~LOC~~+RFID (SPONGE) ×1 IMPLANT
SUCTION TUBE FRAZIER 10FR DISP (SUCTIONS) ×1 IMPLANT
SUT ETHILON 3 0 PS 1 (SUTURE) IMPLANT
SUT MNCRL AB 3-0 PS2 27 (SUTURE) IMPLANT
SUT VIC AB 0 CT1 27XBRD ANBCTR (SUTURE) ×1 IMPLANT
SUT VIC AB 2-0 CT1 TAPERPNT 27 (SUTURE) ×1 IMPLANT
SYR 20ML LL LF (SYRINGE) ×1 IMPLANT
SYR BULB EAR ULCER 3OZ GRN STR (SYRINGE) ×1 IMPLANT
TOWEL GREEN STERILE FF (TOWEL DISPOSABLE) ×2 IMPLANT
TUBE CONNECTING 20X1/4 (TUBING) ×1 IMPLANT
UNDERPAD 30X36 HEAVY ABSORB (UNDERPADS AND DIAPERS) IMPLANT
YANKAUER SUCT BULB TIP NO VENT (SUCTIONS) ×1 IMPLANT

## 2024-01-24 NOTE — Anesthesia Preprocedure Evaluation (Addendum)
 Anesthesia Evaluation  Patient identified by MRN, date of birth, ID band Patient awake    Reviewed: Allergy & Precautions, NPO status , Patient's Chart, lab work & pertinent test results  History of Anesthesia Complications Negative for: history of anesthetic complications  Airway Mallampati: III  TM Distance: >3 FB Neck ROM: Full    Dental  (+) Dental Advisory Given, Teeth Intact   Pulmonary neg pulmonary ROS   Pulmonary exam normal        Cardiovascular negative cardio ROS Normal cardiovascular exam     Neuro/Psych  PSYCHIATRIC DISORDERS Anxiety      BPPV   Neuromuscular disease    GI/Hepatic Neg liver ROS,GERD  Medicated and Controlled,,  Endo/Other  negative endocrine ROS    Renal/GU negative Renal ROS     Musculoskeletal  (+) Arthritis ,    Abdominal   Peds  Hematology negative hematology ROS (+)   Anesthesia Other Findings   Reproductive/Obstetrics                              Anesthesia Physical Anesthesia Plan  ASA: 2  Anesthesia Plan: General   Post-op Pain Management: Tylenol  PO (pre-op)*   Induction: Intravenous  PONV Risk Score and Plan: 3 and Treatment may vary due to age or medical condition, Ondansetron  and Dexamethasone   Airway Management Planned: Oral ETT  Additional Equipment: None  Intra-op Plan:   Post-operative Plan: Extubation in OR  Informed Consent: I have reviewed the patients History and Physical, chart, labs and discussed the procedure including the risks, benefits and alternatives for the proposed anesthesia with the patient or authorized representative who has indicated his/her understanding and acceptance.     Dental advisory given  Plan Discussed with: CRNA and Anesthesiologist  Anesthesia Plan Comments:          Anesthesia Quick Evaluation

## 2024-01-24 NOTE — Anesthesia Procedure Notes (Signed)
 Procedure Name: Intubation Date/Time: 01/24/2024 9:56 AM  Performed by: Lucious Debby BRAVO, MDPre-anesthesia Checklist: Patient identified, Emergency Drugs available, Suction available and Patient being monitored Patient Re-evaluated:Patient Re-evaluated prior to induction Oxygen Delivery Method: Circle system utilized Preoxygenation: Pre-oxygenation with 100% oxygen Induction Type: IV induction Ventilation: Mask ventilation without difficulty Laryngoscope Size: Miller and 2 Grade View: Grade II Tube type: Oral Number of attempts: 2 Airway Equipment and Method: Stylet and Oral airway Placement Confirmation: ETT inserted through vocal cords under direct vision, positive ETCO2 and breath sounds checked- equal and bilateral Tube secured with: Tape Dental Injury: Teeth and Oropharynx as per pre-operative assessment  Comments: First look by CRNA with Mac 3, grade 3 view. 2nd look by Dr. Lucious with Mil 2, grade 2a view

## 2024-01-24 NOTE — Anesthesia Postprocedure Evaluation (Signed)
 Anesthesia Post Note  Patient: Taylor Singleton  Procedure(s) Performed: REPAIR, TENDON, GLUTEUS MINIMUS (Left: Buttocks)     Patient location during evaluation: PACU Anesthesia Type: General Level of consciousness: awake and alert Pain management: pain level controlled Vital Signs Assessment: post-procedure vital signs reviewed and stable Respiratory status: spontaneous breathing, nonlabored ventilation and respiratory function stable Cardiovascular status: stable and blood pressure returned to baseline Anesthetic complications: no   No notable events documented.  Last Vitals:  Vitals:   01/24/24 1145 01/24/24 1230  BP: (!) 145/70 (!) 148/72  Pulse: 72 73  Resp: 12 16  Temp:  (!) 36.2 C  SpO2: 97% 96%    Last Pain:  Vitals:   01/24/24 1230  TempSrc:   PainSc: 4                  Debby FORBES Like

## 2024-01-24 NOTE — Op Note (Signed)
 Date of Surgery: 01/24/2024  INDICATIONS: Taylor Singleton is a 68 y.o.-year-old female with left hip chronic gluteus medius tear.  The risk and benefits of the procedure were discussed in detail and documented in the pre-operative evaluation.   PREOPERATIVE DIAGNOSIS: 1. Left hip chronic gluteus medius tear  POSTOPERATIVE DIAGNOSIS: Same.  PROCEDURE: 1. Left hip gluteus maximus tendon transfer 2. Left hip trochanteric bursectomy  SURGEON: Elspeth LITTIE Parker MD  ASSISTANT: Conley Dawson, ATC  ANESTHESIA:  general  IV FLUIDS AND URINE: See anesthesia record.  ANTIBIOTICS: Ancef   ESTIMATED BLOOD LOSS: 10 mL.  IMPLANTS:  Implant Name Type Inv. Item Serial No. Manufacturer Lot No. LRB No. Used Action  ANCHOR JUGGERKNOT SOFT 2.9 - [id number] Anchor ANCHOR JUGGERKNOT SOFT 2.9  [employer](ORTH,TRAU,BIO,SG) [id number] Left 1 Implanted  ANCHOR JUGGERKNOT SOFT 2.9 - [id number] Anchor ANCHOR JUGGERKNOT SOFT 2.9  [employer](ORTH,TRAU,BIO,SG) [id number] Left 1 Implanted  ANCHOR SUT QUATTRO KNTLS 4.5 - [id number] Anchor ANCHOR SUT QUATTRO KNTLS 4.5  [employer]IM[employer]ON(ORTH,TRAU,BIO,SG) [id number] Left 2 Implanted    DRAINS: None  CULTURES: None  COMPLICATIONS: none  DESCRIPTION OF PROCEDURE:   The patient was identified in the preoperative holding area.  Antibiotics were given 1 hour prior to skin incision.  Timeout was performed and surgical site was marked with nursing.  The patient was subsequently taken back to the operating room.  Anesthesia was induced. The patient was placed in the lateral decubitus position.  Axillary roll and down peroneal nerve was padded well   At this time I began with an approach to the lateral trochanter centered at the vastus ridge.  15 blade was used to incise skin.  This was done down to the level of the IT band.  Electrocautery was utilized to achieve hemostasis.  At this time the IT band was marked in such a way as to preserve an anterior flap.  This was incised  completely with a of an abduction.  Gluteus medius was completely torn and found to be nonviable with significant fatty infiltration.  At this time a flap was mobilized anteriorly from the gluteus maximus in a triangular fashion.  This mobilized well over to the trochanter. The trochanteric bursectomy was seen and excises with Metzenbaums.   A double row construct was utilized with 2 medial row all suture anchors.  These were brought up through the remaining gluteus medius tendon and into the flap.  2 of these were tied to provisionally stabilize the flap.  These were then brought up to the distal most aspect of the flap and brought into 2 lateral row anchors over the native footprint of the gluteus medius.     This time the wound was thoroughly irrigated.  The posterior aspect of the IT band was sutured to the posterior limb of the flap.  This was done with 0 Vicryl.  This was done in an interrupted fashion.  The wound was again irrigated and closed in layers of 0 Vicryl, 2-0 Vicryl and 3-0 nylon.  Xeroform, gauze, Aquacel dressing were applied.   Instrument, sponge, and needle counts were correct prior to wound closure and at the conclusion of the case.  The patient was taken to the PACU without complication         POSTOPERATIVE PLAN: The patient will be weight bearing as tolerated.  She will be placed on Aspirin  for blood clot prevention.  I will see the patient back in 2 weeks.     Elspeth LITTIE Parker, MD 10:36 AM

## 2024-01-24 NOTE — Anesthesia Procedure Notes (Addendum)
 Procedure Name: Intubation Date/Time: 01/24/2024 9:55 AM  Performed by: Edith Elsie Lloyd, CRNAPre-anesthesia Checklist: Patient identified, Emergency Drugs available, Suction available and Patient being monitored Patient Re-evaluated:Patient Re-evaluated prior to induction Oxygen Delivery Method: Circle system utilized Preoxygenation: Pre-oxygenation with 100% oxygen Induction Type: IV induction Ventilation: Mask ventilation without difficulty Laryngoscope Size: Mac, 4, Miller and 3 Grade View: Grade II Tube size: 7.0 mm Number of attempts: 2 (DLx1 by CRNA MAC 4, DLx1 by MDA Miller 3) Placement Confirmation: ETT inserted through vocal cords under direct vision Secured at: 22 cm Tube secured with: Tape Difficulty Due To: Difficult Airway- due to anterior larynx

## 2024-01-24 NOTE — Brief Op Note (Signed)
   Brief Op Note  Date of Surgery: 01/24/2024  Preoperative Diagnosis: LEFT GLUTEUS MEDIUS TEAR  Postoperative Diagnosis: same  Procedure: Procedure(s): REPAIR, TENDON, GLUTEUS MINIMUS  Implants: Implant Name Type Inv. Item Serial No. Manufacturer Lot No. LRB No. Used Action  ANCHOR JUGGERKNOT SOFT 2.9 - [id number] Anchor ANCHOR JUGGERKNOT SOFT 2.9  [employer](ORTH,TRAU,BIO,SG) [id number] Left 1 Implanted  ANCHOR JUGGERKNOT SOFT 2.9 - [id number] Anchor ANCHOR JUGGERKNOT SOFT 2.9  [employer](ORTH,TRAU,BIO,SG) [id number] Left 1 Implanted  ANCHOR SUT QUATTRO KNTLS 4.5 - [id number] Anchor ANCHOR SUT QUATTRO KNTLS 4.5  [employer](ORTH,TRAU,BIO,SG) [id number] Left 2 Implanted    Surgeons: Surgeon(s): Genelle Standing, MD  Anesthesia: General    Estimated Blood Loss: See anesthesia record  Complications: None  Condition to PACU: Stable  Standing LITTIE Genelle, MD 01/24/2024 10:36 AM

## 2024-01-24 NOTE — Transfer of Care (Signed)
 Immediate Anesthesia Transfer of Care Note  Patient: Taylor Singleton  Procedure(s) Performed: REPAIR, TENDON, GLUTEUS MINIMUS (Left: Buttocks)  Patient Location: PACU  Anesthesia Type:General  Level of Consciousness: awake and patient cooperative  Airway & Oxygen Therapy: Patient Spontanous Breathing and Patient connected to face mask oxygen  Post-op Assessment: Report given to RN, Patient moving all extremities, and Patient able to stick tongue midline  Post vital signs: Reviewed and stable  Last Vitals:  Vitals Value Taken Time  BP    Temp    Pulse 86 01/24/24 10:56  Resp 11 01/24/24 10:56  SpO2 99 % 01/24/24 10:56  Vitals shown include unfiled device data.  Last Pain:  Vitals:   01/24/24 0815  TempSrc: Temporal  PainSc: 0-No pain      Patients Stated Pain Goal: 1 (01/24/24 0815)  Complications: No notable events documented.

## 2024-01-24 NOTE — H&P (Signed)
 Expand All Collapse All       Chief Complaint: Hip pain        History of Present Illness:    11/23/2023: Presents today for follow-up of her left hip.  She is still having persistent symptoms which are progressive.  Her injection is no longer been effective   Taylor Singleton is a 68 y.o. female today with ongoing left hip pain in the setting of chronic symptoms about the posterior buttocks radiating to the side.  She is status post multiple epidural injections and ultimately lumbar discectomy with Dr. Joshua.  She is still having persistent pain about the lateral aspect of the hip.  She does have weakness with shortening of her stride and difficulty laying directly on the side as well as getting in and out of cars.  She does enjoy staying active but this has been limited as a result of her hip pain.  She has been seeing Dr. 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     Varicose vein of leg     Wears glasses               Past Surgical History:  Procedure Laterality Date   ANTERIOR AND POSTERIOR REPAIR WITH SACROSPINOUS FIXATION N/A 07/19/2022    Procedure: POSTERIOR REPAIR WITHP PERINEORRHAPHY AND  SACROSPINOUS FIXATION;  Surgeon: Marilynne Rosaline SAILOR, MD;  Location: [facility];  Service: Gynecology;  Laterality: N/A;  Total time requested is 1.5hrs   BREAST CYST EXCISION Left     CHOLECYSTECTOMY       CHOLECYSTECTOMY, LAPAROSCOPIC   2010   COLONOSCOPY WITH PROPOFOL  N/A 03/22/2023    Procedure: COLONOSCOPY WITH PROPOFOL ;  Surgeon: Eartha Angelia Sieving, MD;  Location: [facility];  Service: Gastroenterology;  Laterality: N/A;  8:45AM;ASA 1   TOTAL ABDOMINAL HYSTERECTOMY W/ BILATERAL SALPINGOOPHORECTOMY        1990s        Social History         Socioeconomic History   Marital status: Married      Spouse name: Not on file   Number of children: 2   Years of education: Not on file   Highest education level: Not on file  Occupational History   Not on file  Tobacco Use   Smoking status: Never   Smokeless tobacco: Never  Vaping Use   Vaping status: Never Used  Substance and Sexual Activity   Alcohol use: No   Drug use: Never   Sexual activity: Not Currently      Birth control/protection: Surgical      Comment: hyst  Other Topics Concern   Not on file  Social History Narrative   Not on file    Social Drivers of Health        Financial Resource Strain: Low Risk  (05/25/2022)    Overall Financial Resource Strain (CARDIA)     Difficulty of Paying Living Expenses: Not hard at all  Food Insecurity: No Food Insecurity (05/25/2022)  Hunger Vital Sign     Worried About Running Out of Food in the Last Year: Never true     Ran Out of Food in the Last Year: Never true  Transportation Needs: No Transportation Needs (05/25/2022)    PRAPARE - Therapist, art (Medical): No     Lack of Transportation (Non-Medical): No  Physical Activity: Insufficiently Active (05/25/2022)    Exercise Vital Sign     Days of Exercise per Week: 3 days     Minutes of Exercise per Session: 30 min  Stress: No Stress Concern Present (05/25/2022)    [employer] of Occupational Health - Occupational Stress Questionnaire     Feeling of Stress : Not at all  Social Connections: Moderately Isolated (05/25/2022)    Social Connection and Isolation Panel [NHANES]     Frequency of Communication with Friends and Family: Twice a week     Frequency of Social Gatherings with Friends and Family: Three times a week     Attends Religious Services: Never      Active Member of Clubs or Organizations: No     Attends Banker Meetings: Never     Marital Status: Married         Family History  Problem Relation Age of Onset   Diabetes Mother     Heart attack Brother     Breast cancer Neg Hx          Allergies  No Known Allergies         Current Outpatient Medications  Medication Sig Dispense Refill   atorvastatin  (LIPITOR) 40 MG tablet Take 1 tablet (40 mg total) by mouth daily. 90 tablet 2   Calcium  Carb-Cholecalciferol (CALCIUM /VITAMIN D) 600-400 MG-UNIT TABS Take 2 tablets by mouth daily.       nystatin -triamcinolone  ointment (MYCOLOG) Apply 1 Application topically 2 (two) times daily. Apply to affected areas BID 60 g 1   oxybutynin  (DITROPAN  XL) 5 MG 24 hr tablet Take 1 tablet (5 mg total) by mouth at bedtime. 30 tablet 11   pantoprazole  (PROTONIX ) 40 MG tablet Take 1 tablet (40 mg total) by mouth daily. 90 tablet 2      No current facility-administered medications for this visit.      Imaging Results (Last 48 hours)  No results found.     Review of Systems:   A ROS was performed including pertinent positives and negatives as documented in the HPI.   Physical Exam :   Constitutional: NAD and appears stated age Neurological: Alert and oriented Psych: Appropriate affect and cooperative There were no vitals taken for this visit.    Comprehensive Musculoskeletal Exam:     Left hip with tenderness about the gluteus as well as trochanteric insertion.  There is weakness with resisted abduction and Trendelenburg gait.  30 degrees internal rotation of the left hip without pain and negative FADIR or FABER     Imaging:   Xray (left hip): Normal osteoarthritis left hip   MRI (left hip): Insertional tearing of the gluteus medius insertion with some atrophy on the T1 view     I personally reviewed and interpreted the radiographs.     Assessment and Plan:   68 y.o. female with left gluteus medius insertional  tendinopathy in the setting of chronic left hip pain.  At this time she has had both therapy as well as injections.  Given this we did discuss treatment options.  I did discuss that  I do see some atrophy on the T1 MRI.  Given this overall I do believe that she would benefit from a left hip gluteus maximus tendon transfer.  I did discuss the risks and benefits as well as the associated recovery time for.  After discussion she would like to proceed with this     - Plan for left hip gluteus maximus tendon transfer     After a lengthy discussion of treatment options, including risks, benefits, alternatives, complications of surgical and nonsurgical conservative options, the patient elected surgical repair.    The patient  is aware of the material risks  and complications including, but not limited to injury to adjacent structures, neurovascular injury, infection, numbness, bleeding, implant failure, thermal burns, stiffness, persistent pain, failure to heal, disease transmission from allograft, need for further surgery, dislocation, anesthetic risks, blood clots, risks of death,and others. The probabilities of surgical success and failure discussed with patient given their particular co-morbidities.The time and nature of expected rehabilitation and recovery was discussed.The patient's questions were all answered preoperatively.  No barriers to understanding were noted. I explained the natural history of the disease process and Rx rationale.  I explained to the patient what I considered to be reasonable expectations given their personal situation.  The final treatment plan was arrived at through a shared patient decision making process model.     I personally saw and evaluated the patient, and participated in the management and treatment plan.   Elspeth Parker, MD Attending Physician, Orthopedic Surgery   This document was dictated using Dragon voice recognition software. A reasonable attempt at proof  reading has been made to minimize errors

## 2024-01-24 NOTE — Discharge Instructions (Addendum)
 Discharge Instructions    Attending Surgeon: Elspeth Parker, MD Office Phone Number: [phone]   Diagnosis and Procedures:    Surgeries Performed: Left hip gluteus maximus tendon transfer  Discharge Plan:    Diet: Resume usual diet. Begin with light or bland foods.  Drink plenty of fluids.  Activity:  Weight bearing as tolerated left leg. You are advised to go home directly from the hospital or surgical center. Restrict your activities.  GENERAL INSTRUCTIONS: 1.  Please apply ice to your wound to help with swelling and inflammation. This will improve your comfort and your overall recovery following surgery.     2. Please call Dr. Danetta office at [phone] with questions Monday-Friday during business hours. If no one answers, please leave a message and someone should get back to the patient within 24 hours. For emergencies please call 911 or proceed to the emergency room.   3. Patient to notify surgical team if experiences any of the following: Bowel/Bladder dysfunction, uncontrolled pain, nerve/muscle weakness, incision with increased drainage or redness, nausea/vomiting and Fever greater than 101.0 F.  Be alert for signs of infection including redness, streaking, odor, fever or chills. Be alert for excessive pain or bleeding and notify your surgeon immediately.  WOUND INSTRUCTIONS:   Leave your dressing, cast, or splint in place until your post operative visit.  Keep it clean and dry.  Always keep the incision clean and dry until the staples/sutures are removed. If there is no drainage from the incision you should keep it open to air. If there is drainage from the incision you must keep it covered at all times until the drainage stops  Do not soak in a bath tub, hot tub, pool, lake or other body of water until 21 days after your surgery and your incision is completely dry and healed.  If you have removable sutures (or staples) they must be removed 10-14 days  (unless otherwise instructed) from the day of your surgery.     1)  Elevate the extremity as much as possible.  2)  Keep the dressing clean and dry.  3)  Please call us  if the dressing becomes wet or dirty.  4)  If you are experiencing worsening pain or worsening swelling, please call.     MEDICATIONS: Resume all previous home medications at the previous prescribed dose and frequency unless otherwise noted Start taking the  pain medications on an as-needed basis as prescribed  Please taper down pain medication over the next week following surgery.  Ideally you should not require a refill of any narcotic pain medication.  Take pain medication with food to minimize nausea. In addition to the prescribed pain medication, you may take over-the-counter pain relievers such as Tylenol .  Do NOT take additional tylenol  if your pain medication already has tylenol  in it.  Aspirin  325mg  daily per instructions on bottle. Narcotic policy: Per [facility] clinic policy, our goal is ensure optimal postoperative pain control with a multimodal pain management strategy. For all [facility] patients, our goal is to wean post-operative narcotic medications by 6 weeks post-operatively, and many times sooner. If this is not possible due to utilization of pain medication prior to surgery, your [facility] doctor will support your acute post-operative pain control for the first 6 weeks postoperatively, with a plan to transition you back to your primary pain team following that. Taylor Singleton will work to ensure a Therapist, occupational.       FOLLOWUP INSTRUCTIONS: 1. Follow up at the  Physical Therapy Clinic 3-4 days following surgery. This appointment should be scheduled unless other arrangements have been made.The Physical Therapy scheduling number is [phone] if an appointment has not already been arranged.  2. Contact Dr. Danetta office during office hours at [phone] or the practice after hours line at [phone] for  non-emergencies. For medical emergencies call 911.   Discharge Location: Home  NO TYLENOL  UNITL 2:20 p.m.  Post Anesthesia Home Care Instructions  Activity: Get plenty of rest for the remainder of the day. A responsible individual must stay with you for 24 hours following the procedure.  For the next 24 hours, DO NOT: -Drive a car -Advertising copywriter -Drink alcoholic beverages -Take any medication unless instructed by your physician -Make any legal decisions or sign important papers.  Meals: Start with liquid foods such as gelatin or soup. Progress to regular foods as tolerated. Avoid greasy, spicy, heavy foods. If nausea and/or vomiting occur, drink only clear liquids until the nausea and/or vomiting subsides. Call your physician if vomiting continues.  Special Instructions/Symptoms: Your throat may feel dry or sore from the anesthesia or the breathing tube placed in your throat during surgery. If this causes discomfort, gargle with warm salt water. The discomfort should disappear within 24 hours.  If you had a scopolamine  patch placed behind your ear for the management of post- operative nausea and/or vomiting:  1. The medication in the patch is effective for 72 hours, after which it should be removed.  Wrap patch in a tissue and discard in the trash. Wash hands thoroughly with soap and water. 2. You may remove the patch earlier than 72 hours if you experience unpleasant side effects which may include dry mouth, dizziness or visual disturbances. 3. Avoid touching the patch. Wash your hands with soap and water after contact with the patch.

## 2024-01-25 ENCOUNTER — Encounter (HOSPITAL_BASED_OUTPATIENT_CLINIC_OR_DEPARTMENT_OTHER): Payer: Self-pay | Admitting: Orthopaedic Surgery

## 2024-01-25 ENCOUNTER — Telehealth (HOSPITAL_BASED_OUTPATIENT_CLINIC_OR_DEPARTMENT_OTHER): Payer: Self-pay | Admitting: Orthopaedic Surgery

## 2024-01-25 NOTE — Telephone Encounter (Signed)
 Patient said she needs to know when she can take off her socks and does she need to wear to bed.

## 2024-01-25 NOTE — Telephone Encounter (Signed)
 Returned call to patient informing her she may remove compression socks 72 hours post op but to make sure she is getting up and moving

## 2024-01-26 ENCOUNTER — Ambulatory Visit (HOSPITAL_BASED_OUTPATIENT_CLINIC_OR_DEPARTMENT_OTHER): Attending: Orthopaedic Surgery | Admitting: Physical Therapy

## 2024-01-26 ENCOUNTER — Other Ambulatory Visit: Payer: Self-pay

## 2024-01-26 ENCOUNTER — Encounter (HOSPITAL_BASED_OUTPATIENT_CLINIC_OR_DEPARTMENT_OTHER): Payer: Self-pay | Admitting: Physical Therapy

## 2024-01-26 DIAGNOSIS — M25552 Pain in left hip: Secondary | ICD-10-CM | POA: Insufficient documentation

## 2024-01-26 DIAGNOSIS — R2689 Other abnormalities of gait and mobility: Secondary | ICD-10-CM | POA: Insufficient documentation

## 2024-01-26 DIAGNOSIS — M25652 Stiffness of left hip, not elsewhere classified: Secondary | ICD-10-CM | POA: Insufficient documentation

## 2024-01-26 DIAGNOSIS — M6281 Muscle weakness (generalized): Secondary | ICD-10-CM | POA: Insufficient documentation

## 2024-01-26 NOTE — Therapy (Signed)
 OUTPATIENT PHYSICAL THERAPY LOWER EXTREMITY EVALUATION   Patient Name: Taylor Singleton MRN: [id number] DOB:May 05, 1956, 68 y.o., female Today's Date: 01/26/2024  END OF SESSION:  PT End of Session - 01/26/24 1145     Visit Number 1    Number of Visits 24    Date for PT Re-Evaluation 04/19/24    Authorization Type [employer] MEDICARE    Progress Note Due on Visit 10    PT Start Time 1146    PT Stop Time 1230    PT Time Calculation (min) 44 min    Activity Tolerance Patient tolerated treatment well    Behavior During Therapy WFL for tasks assessed/performed          Past Medical History:  Diagnosis Date   Anxiety    Arthritis    Chronic idiopathic constipation    GERD (gastroesophageal reflux disease)    HLD (hyperlipidemia)    Hyperlipidemia    Osteoporosis    Prolapse of posterior vaginal wall    Sciatica    Vaginal vault 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    Varicose vein of leg    Wears glasses    Past Surgical History:  Procedure Laterality Date   ANTERIOR AND POSTERIOR REPAIR WITH SACROSPINOUS FIXATION N/A 07/19/2022   Procedure: POSTERIOR REPAIR WITHP PERINEORRHAPHY AND  SACROSPINOUS FIXATION;  Surgeon: Marilynne Rosaline SAILOR, MD;  Location: [facility];  Service: Gynecology;  Laterality: N/A;  Total time requested is 1.5hrs   BREAST CYST EXCISION Left    CHOLECYSTECTOMY     CHOLECYSTECTOMY, LAPAROSCOPIC  2010   COLONOSCOPY WITH PROPOFOL  N/A 03/22/2023   Procedure: COLONOSCOPY WITH PROPOFOL ;  Surgeon: Eartha Angelia Sieving, MD;  [facility];  Service: Gastroenterology;  Laterality: N/A;  8:45AM;ASA 1   GLUTEUS MINIMUS REPAIR Left 01/24/2024   Procedure: REPAIR, TENDON, GLUTEUS MINIMUS;  Surgeon: Genelle Standing, MD;  Location: [facility];  Service: Orthopedics;  Laterality: Left;  LEFT GLUTEUS MAXIMUS TENDON TRANSFER   TOTAL ABDOMINAL HYSTERECTOMY W/ BILATERAL SALPINGOOPHORECTOMY     1990s   Patient Active Problem List    Diagnosis Date Noted   Tear of left gluteus medius tendon 01/24/2024   Candidiasis, intertrigo 08/26/2023   Seborrheic keratoses 08/26/2023   Greater trochanteric pain syndrome 05/30/2023   Acute cystitis without hematuria 05/30/2023   Encounter for colorectal cancer screening 03/22/2023   Nausea without vomiting 03/17/2023   Flatulence 03/17/2023   S/P hysterectomy 10/06/2022   Urinary frequency 10/06/2022   Vaginal burning 10/06/2022   Vaginal irritation 10/06/2022   Vulvar irritation 10/06/2022   Sebaceous cyst of labia 10/06/2022   BPPV (benign paroxysmal positional vertigo), left 01/29/2022   Laryngopharyngeal reflux (LPR) 01/29/2022   Pharyngoesophageal dysphagia 01/29/2022   HLD (hyperlipidemia)     PCP: Duanne Butler DASEN, MD  REFERRING PROVIDER: Genelle Standing, MD  REFERRING DIAG: [phone] (ICD-10-CM) - Pain in left hip 1. Left hip gluteus maximus tendon transfer 2. Left hip trochanteric bursectomy; DOS 01/24/24  THERAPY DIAG:  Pain in left hip  Stiffness of left hip, not elsewhere classified  Other abnormalities of gait and mobility  Muscle weakness (generalized)  Rationale for Evaluation and Treatment: Rehabilitation  ONSET DATE: DOS 01/24/24  SUBJECTIVE:   SUBJECTIVE STATEMENT: Patient states been doing alright since surgery. Has been using rollator. Using ice machine. Getting socks on is tough.   PERTINENT HISTORY: HLD, osteoporosis PAIN:  Are you having pain? Yes: NPRS scale: 3-4/10 Pain location: L hip Pain description: sore Aggravating factors: walking Relieving  factors: rest  PRECAUTIONS: Other: Left hip gluteus maximus tendon transfer  WEIGHT BEARING RESTRICTIONS: WBAT  FALLS:  Has patient fallen in last 6 months? No  PLOF: Independent  PATIENT GOALS: be able to function normal again  OBJECTIVE: (objective measures from initial evaluation unless otherwise dated)  PATIENT SURVEYS:  LEFS  Extreme difficulty/unable (0), Quite a bit of  difficulty (1), Moderate difficulty (2), Little difficulty (3), No difficulty (4) Survey date:  01/26/24  Any of your usual work, housework or school activities 3  2. Usual hobbies, recreational or sporting activities 3  3. Getting into/out of the bath 0  4. Walking between rooms 4  5. Putting on socks/shoes 4  6. Squatting  4  7. Lifting an object, like a bag of groceries from the floor 4  8. Performing light activities around your home 3  9. Performing heavy activities around your home 3  10. Getting into/out of a car 4  11. Walking 2 blocks 3  12. Walking 1 mile 1  13. Going up/down 10 stairs (1 flight) 4  14. Standing for 1 hour 3  15.  sitting for 1 hour 4  16. Running on even ground 1  17. Running on uneven ground 1  18. Making sharp turns while running fast 1  19. Hopping  1  20. Rolling over in bed 3  Score total:  54/80     COGNITION: Overall cognitive status: Within functional limits for tasks assessed     SENSATION: WFL   POSTURE: rounded shoulders and forward head  PALPATION: Grossly tender lateral L hip  LOWER EXTREMITY ROM:  Passive ROM Right eval Left eval  Hip flexion  > 90  Hip extension    Hip abduction    Hip adduction    Hip internal rotation    Hip external rotation    Knee flexion    Knee extension    Ankle dorsiflexion    Ankle plantarflexion    Ankle inversion    Ankle eversion     (Blank rows = not tested) *= pain/symptoms  LOWER EXTREMITY MMT:  MMT Right eval Left eval  Hip flexion    Hip extension    Hip abduction    Hip adduction    Hip internal rotation    Hip external rotation    Knee flexion 5 5  Knee extension 5 4+  Ankle dorsiflexion    Ankle plantarflexion    Ankle inversion    Ankle eversion     (Blank rows = not tested) *= pain/symptoms   FUNCTIONAL TESTS:  NT Transfer: labored with UE support  GAIT: Distance walked: 100 feet Assistive device utilized: rollator Level of assistance: Modified  independence Comments: slightly flexed trunk with rollator, antalgic on L    TODAY'S TREATMENT:  DATE:  01/26/24 Eval, HEP, education, rollator adjustment, gait mechanics   PATIENT EDUCATION:  Education details: Patient educated on exam findings, POC, scope of PT, HEP, relevant anatomy and biomechanics, compression garments, gait mechanics, and protocol/precautions. Person educated: Patient Education method: Explanation, Demonstration, and Handouts Education comprehension: verbalized understanding, returned demonstration, verbal cues required, and tactile cues required  HOME EXERCISE PROGRAM: Access Code: [email or web address] URL: [email or web address] Date: 01/26/2024 Prepared by: Prentice Mccall Will  Exercises - Gluteal Sets (Mirrored)  - 3 x daily - 7 x weekly - 2 sets - 10 reps - 5-10 second hold - Supine Quadricep Sets (Mirrored)  - 3 x daily - 7 x weekly - 2 sets - 10 reps - 5-10 second hold - Supine Ankle Pumps  - 5 x daily - 7 x weekly - 20 reps - Hooklying Gluteal Sets  - 3 x daily - 7 x weekly - 10 reps - 5-10 second hold - Supine Hip Adduction Isometric with Ball  - 3 x daily - 7 x weekly - 10 reps - 5-10 second hold  ASSESSMENT:  CLINICAL IMPRESSION: Patient a 68 y.o. y.o. female who was seen today for physical therapy evaluation and treatment for s/p L hip gluteus maximus tendon transfer on 01/24/24. Patient presents with pain limited deficits in L hip strength, ROM, endurance, activity tolerance, gait, balance, and functional mobility with ADL. Patient is having to modify and restrict ADL as indicated by outcome measure score as well as subjective information and objective measures which is affecting overall participation. Patient will benefit from skilled physical therapy in order to improve function and reduce impairment.  OBJECTIVE IMPAIRMENTS:  Abnormal gait, decreased activity tolerance, decreased balance, decreased endurance, decreased mobility, difficulty walking, decreased ROM, decreased strength, increased muscle spasms, impaired flexibility, improper body mechanics, and pain  ACTIVITY LIMITATIONS: lifting, bending, sitting, standing, squatting, sleeping, stairs, transfers, locomotion level, and caring for others  PARTICIPATION LIMITATIONS: meal prep, cleaning, laundry, shopping, community activity, occupation, and yard work  PERSONAL FACTORS: Time since onset of injury/illness/exacerbation and 1-2 comorbidities: HLD, osteoporosis are also affecting patient's functional outcome.   REHAB POTENTIAL: Good  CLINICAL DECISION MAKING: Evolving/moderate complexity  EVALUATION COMPLEXITY: Moderate   GOALS: Goals reviewed with patient? Yes  SHORT TERM GOALS: Target date: 03/08/2024    Patient will be independent with HEP in order to improve functional outcomes. Baseline: Goal status: INITIAL  2.  Patient will report at least 25% improvement in symptoms for improved quality of life. Baseline: Goal status: INITIAL  3.  Pain free flexion to 90 Baseline:  Goal status: INITIAL  4.  SLS 30s without compensation Baseline:  Goal status: INITIAL   LONG TERM GOALS: Target date: 04/19/2024    Patient will report at least 75% improvement in symptoms for improved quality of life. Baseline:  Goal status: INITIAL  2.  Patient will improve LEFS score by at least 15 points in order to indicate improved tolerance to activity. Baseline:  Goal status: INITIAL  3.  Patient will be able to navigate stairs with reciprocal pattern without compensation in order to demonstrate improved LE strength. Baseline:  Goal status: INITIAL  4.  Patient will demonstrate MMT 80% of opposite lower extremity in all tested musculature as evidence of improved strength to assist with stair ambulation and gait. Baseline:  Goal status: INITIAL  5.   Patient will be able to return to all activities unrestricted for improved ability to perform work functions and participate with family.  Baseline:  Goal status: INITIAL  PLAN:  PT FREQUENCY: 1-2x/week  PT DURATION: 12 weeks  PLANNED INTERVENTIONS: 97164- PT Re-evaluation, 97110-Therapeutic exercises, 97530- Therapeutic activity, [id number]- Neuromuscular re-education, 97535- Self Care, [id number]- Manual therapy, [phone]- Gait training, [phone]- Orthotic Fit/training, [phone]- Canalith repositioning, [id number]- Aquatic Therapy, [phone]- Splinting, [phone]- Wound care (first 20 sq cm), 97598- Wound care (each additional 20 sq cm)Patient/Family education, Balance training, Stair training, Taping, Dry Needling, Joint mobilization, Joint manipulation, Spinal manipulation, Spinal mobilization, Scar mobilization, and DME instructions.  PLAN FOR NEXT SESSION: progress with glute tendon transfer protocol   Prentice GORMAN Stains, PT, DPT 01/26/2024, 12:43 PM

## 2024-01-30 ENCOUNTER — Telehealth (HOSPITAL_BASED_OUTPATIENT_CLINIC_OR_DEPARTMENT_OTHER): Payer: Self-pay | Admitting: Orthopaedic Surgery

## 2024-01-30 NOTE — Telephone Encounter (Signed)
 Patient has question about symptoms after her surgery. She does not know if it is the meds or if it normal.

## 2024-02-03 ENCOUNTER — Ambulatory Visit (HOSPITAL_BASED_OUTPATIENT_CLINIC_OR_DEPARTMENT_OTHER): Admitting: Orthopaedic Surgery

## 2024-02-03 ENCOUNTER — Ambulatory Visit (HOSPITAL_BASED_OUTPATIENT_CLINIC_OR_DEPARTMENT_OTHER): Admitting: Physical Therapy

## 2024-02-03 DIAGNOSIS — M6281 Muscle weakness (generalized): Secondary | ICD-10-CM | POA: Diagnosis not present

## 2024-02-03 DIAGNOSIS — R2689 Other abnormalities of gait and mobility: Secondary | ICD-10-CM

## 2024-02-03 DIAGNOSIS — M25652 Stiffness of left hip, not elsewhere classified: Secondary | ICD-10-CM

## 2024-02-03 DIAGNOSIS — S76012A Strain of muscle, fascia and tendon of left hip, initial encounter: Secondary | ICD-10-CM

## 2024-02-03 DIAGNOSIS — M25552 Pain in left hip: Secondary | ICD-10-CM | POA: Diagnosis not present

## 2024-02-03 NOTE — Progress Notes (Signed)
 Post Operative Evaluation    Procedure/Date of Surgery: Left hip gluteus maximus tendon transfer 8/12  Interval History:    Presents 2 weeks status post above procedure.  Overall she is doing extremely well.  She is just walking with a cane.  She is being on physical therapy.  Denies any pain about the hip   PMH/PSH/Family History/Social History/Meds/Allergies:    Past Medical History:  Diagnosis Date   Anxiety    Arthritis    Chronic idiopathic constipation    GERD (gastroesophageal reflux disease)    HLD (hyperlipidemia)    Hyperlipidemia    Osteoporosis    Prolapse of posterior vaginal wall    Sciatica    Vaginal vault 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    Varicose vein of leg    Wears glasses    Past Surgical History:  Procedure Laterality Date   ANTERIOR AND POSTERIOR REPAIR WITH SACROSPINOUS FIXATION N/A 07/19/2022   Procedure: POSTERIOR REPAIR WITHP PERINEORRHAPHY AND  SACROSPINOUS FIXATION;  Surgeon: Marilynne Rosaline SAILOR, MD;  Location: [facility];  Service: Gynecology;  Laterality: N/A;  Total time requested is 1.5hrs   BREAST CYST EXCISION Left    CHOLECYSTECTOMY     CHOLECYSTECTOMY, LAPAROSCOPIC  2010   COLONOSCOPY WITH PROPOFOL  N/A 03/22/2023   Procedure: COLONOSCOPY WITH PROPOFOL ;  Surgeon: Eartha Angelia Sieving, MD;  Location: [facility];  Service: Gastroenterology;  Laterality: N/A;  8:45AM;ASA 1   GLUTEUS MINIMUS REPAIR Left 01/24/2024   Procedure: REPAIR, TENDON, GLUTEUS MINIMUS;  Surgeon: Genelle Standing, MD;  [facility];  Service: Orthopedics;  Laterality: Left;  LEFT GLUTEUS MAXIMUS TENDON TRANSFER   TOTAL ABDOMINAL HYSTERECTOMY W/ BILATERAL SALPINGOOPHORECTOMY     1990s   Social History   Socioeconomic History   Marital status: Married    Spouse name: Not on file   Number of children: 2   Years of education: Not on file   Highest education level: Not on file   Occupational History   Not on file  Tobacco Use   Smoking status: Never   Smokeless tobacco: Never  Vaping Use   Vaping status: Never Used  Substance and Sexual Activity   Alcohol use: No   Drug use: Never   Sexual activity: Not Currently    Birth control/protection: Surgical    Comment: hyst  Other Topics Concern   Not on file  Social History Narrative   Not on file   Social Drivers of Health   Financial Resource Strain: Low Risk  (11/24/2023)   Overall Financial Resource Strain (CARDIA)    Difficulty of Paying Living Expenses: Not hard at all  Food Insecurity: No Food Insecurity (11/24/2023)   Hunger Vital Sign    Worried About Running Out of Food in the Last Year: Never true    Ran Out of Food in the Last Year: Never true  Transportation Needs: No Transportation Needs (11/24/2023)   PRAPARE - Administrator, Civil Service (Medical): No    Lack of Transportation (Non-Medical): No  Physical Activity: Inactive (11/24/2023)   Exercise Vital Sign    Days of Exercise per Week: 0 days    Minutes of Exercise per Session: 0 min  Stress: No Stress Concern Present (11/24/2023)   [employer] of Occupational Health - Occupational Stress Questionnaire  Feeling of Stress: Not at all  Social Connections: Moderately Isolated (11/24/2023)   Social Connection and Isolation Panel    Frequency of Communication with Friends and Family: Three times a week    Frequency of Social Gatherings with Friends and Family: More than three times a week    Attends Religious Services: Never    Database administrator or Organizations: No    Attends Engineer, structural: Never    Marital Status: Married   Family History  Problem Relation Age of Onset   Diabetes Mother    Heart attack Brother    Breast cancer Neg Hx    No Known Allergies Current Outpatient Medications  Medication Sig Dispense Refill   aspirin  EC 325 MG tablet Take 1 tablet (325 mg total) by mouth daily. 14  tablet 0   atorvastatin  (LIPITOR) 40 MG tablet Take 1 tablet (40 mg total) by mouth daily. 90 tablet 2   Calcium  Carb-Cholecalciferol (CALCIUM /VITAMIN D) 600-400 MG-UNIT TABS Take 2 tablets by mouth daily.     clonazePAM  (KLONOPIN ) 0.5 MG tablet Take 1 tablet (0.5 mg total) by mouth 2 (two) times daily as needed for anxiety. (Patient not taking: Reported on 01/17/2024) 20 tablet 1   oxybutynin  (DITROPAN  XL) 5 MG 24 hr tablet Take 1 tablet (5 mg total) by mouth at bedtime. 30 tablet 11   oxyCODONE  (ROXICODONE ) 5 MG immediate release tablet Take 1 tablet (5 mg total) by mouth every 4 (four) hours as needed for severe pain (pain score 7-10) or breakthrough pain. 30 tablet 0   pantoprazole  (PROTONIX ) 40 MG tablet Take 1 tablet (40 mg total) by mouth daily. 90 tablet 2   psyllium (REGULOID) 0.52 g capsule Take 0.52 g by mouth daily.     sertraline  (ZOLOFT ) 50 MG tablet Take 1 tablet (50 mg total) by mouth daily. 30 tablet 3   No current facility-administered medications for this visit.   No results found.  Review of Systems:   A ROS was performed including pertinent positives and negatives as documented in the HPI.   Musculoskeletal Exam:    There were no vitals taken for this visit.  Left hip is well-appearing without erythema or drainage.  Incision is well-appearing 30 degrees internal/external rotation of the hip without pain  Imaging:      I personally reviewed and interpreted the radiographs.   Assessment:   2 weeks status post left hip gluteus maximus tendon transfer overall doing extremely well.  This time I will plan to see her back in 4 weeks for reassessment  Plan :    - Return to clinic 4 weeks for reassessment      I personally saw and evaluated the patient, and participated in the management and treatment plan.  Elspeth Parker, MD Attending Physician, Orthopedic Surgery  This document was dictated using Dragon voice recognition software. A reasonable attempt at  proof reading has been made to minimize errors.

## 2024-02-03 NOTE — Therapy (Signed)
 OUTPATIENT PHYSICAL THERAPY TREATMENT   Patient Name: Taylor Singleton MRN: [id number] DOB:[birth year], 68 y.o., female Today's Date: 02/03/2024  END OF SESSION:  PT End of Session - 02/03/24 1152     Visit Number 2    Number of Visits 24    Date for PT Re-Evaluation 04/19/24    Authorization Type [employer] MEDICARE    Progress Note Due on Visit 10    PT Start Time 1150    PT Stop Time 1230    PT Time Calculation (min) 40 min    Activity Tolerance Patient tolerated treatment well    Behavior During Therapy WFL for tasks assessed/performed          Past Medical History:  Diagnosis Date   Anxiety    Arthritis    Chronic idiopathic constipation    GERD (gastroesophageal reflux disease)    HLD (hyperlipidemia)    Hyperlipidemia    Osteoporosis    Prolapse of posterior vaginal wall    Sciatica    Vaginal vault 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    Varicose vein of leg    Wears glasses    Past Surgical History:  Procedure Laterality Date   ANTERIOR AND POSTERIOR REPAIR WITH SACROSPINOUS FIXATION N/A 07/19/2022   Procedure: POSTERIOR REPAIR WITHP PERINEORRHAPHY AND  SACROSPINOUS FIXATION;  Surgeon: Marilynne Rosaline SAILOR, MD;  Location: [facility];  Service: Gynecology;  Laterality: N/A;  Total time requested is 1.5hrs   BREAST CYST EXCISION Left    CHOLECYSTECTOMY     CHOLECYSTECTOMY, LAPAROSCOPIC  2010   COLONOSCOPY WITH PROPOFOL  N/A 03/22/2023   Procedure: COLONOSCOPY WITH PROPOFOL ;  Surgeon: Eartha Angelia Sieving, MD;  Location: [facility];  Service: Gastroenterology;  Laterality: N/A;  8:45AM;ASA 1   GLUTEUS MINIMUS REPAIR Left 01/24/2024   Procedure: REPAIR, TENDON, GLUTEUS MINIMUS;  Surgeon: Genelle Standing, MD;  Location: [facility];  Service: Orthopedics;  Laterality: Left;  LEFT GLUTEUS MAXIMUS TENDON TRANSFER   TOTAL ABDOMINAL HYSTERECTOMY W/ BILATERAL SALPINGOOPHORECTOMY     1990s   Patient Active Problem List   Diagnosis Date Noted    Tear of left gluteus medius tendon 01/24/2024   Candidiasis, intertrigo 08/26/2023   Seborrheic keratoses 08/26/2023   Greater trochanteric pain syndrome 05/30/2023   Acute cystitis without hematuria 05/30/2023   Encounter for colorectal cancer screening 03/22/2023   Nausea without vomiting 03/17/2023   Flatulence 03/17/2023   S/P hysterectomy 10/06/2022   Urinary frequency 10/06/2022   Vaginal burning 10/06/2022   Vaginal irritation 10/06/2022   Vulvar irritation 10/06/2022   Sebaceous cyst of labia 10/06/2022   BPPV (benign paroxysmal positional vertigo), left 01/29/2022   Laryngopharyngeal reflux (LPR) 01/29/2022   Pharyngoesophageal dysphagia 01/29/2022   HLD (hyperlipidemia)     PCP: Duanne Butler DASEN, MD  REFERRING PROVIDER: Genelle Standing, MD  REFERRING DIAG: [id number] (ICD-10-CM) - Pain in left hip 1. Left hip gluteus maximus tendon transfer 2. Left hip trochanteric bursectomy; DOS 01/24/24  THERAPY DIAG:  Pain in left hip  Stiffness of left hip, not elsewhere classified  Other abnormalities of gait and mobility  Muscle weakness (generalized)  Rationale for Evaluation and Treatment: Rehabilitation  ONSET DATE: DOS 01/24/24  SUBJECTIVE:   SUBJECTIVE STATEMENT: Patient states using SPC cane today. Feels she is progressing. HEP is going well.   PERTINENT HISTORY: HLD, osteoporosis PAIN:  Are you having pain? Yes: NPRS scale: 2/10 Pain location: L hip Pain description: sore Aggravating factors: walking Relieving factors: rest  PRECAUTIONS: Other: Left hip  gluteus maximus tendon transfer  WEIGHT BEARING RESTRICTIONS: WBAT  FALLS:  Has patient fallen in last 6 months? No  PLOF: Independent  PATIENT GOALS: be able to function normal again  OBJECTIVE: (objective measures from initial evaluation unless otherwise dated)  PATIENT SURVEYS:  LEFS  Extreme difficulty/unable (0), Quite a bit of difficulty (1), Moderate difficulty (2), Little difficulty (3),  No difficulty (4) Survey date:  01/26/24  Any of your usual work, housework or school activities 3  2. Usual hobbies, recreational or sporting activities 3  3. Getting into/out of the bath 0  4. Walking between rooms 4  5. Putting on socks/shoes 4  6. Squatting  4  7. Lifting an object, like a bag of groceries from the floor 4  8. Performing light activities around your home 3  9. Performing heavy activities around your home 3  10. Getting into/out of a car 4  11. Walking 2 blocks 3  12. Walking 1 mile 1  13. Going up/down 10 stairs (1 flight) 4  14. Standing for 1 hour 3  15.  sitting for 1 hour 4  16. Running on even ground 1  17. Running on uneven ground 1  18. Making sharp turns while running fast 1  19. Hopping  1  20. Rolling over in bed 3  Score total:  54/80     COGNITION: Overall cognitive status: Within functional limits for tasks assessed     SENSATION: WFL   POSTURE: rounded shoulders and forward head  PALPATION: Grossly tender lateral L hip  LOWER EXTREMITY ROM:  Passive ROM Right eval Left eval  Hip flexion  > 90  Hip extension    Hip abduction    Hip adduction    Hip internal rotation    Hip external rotation    Knee flexion    Knee extension    Ankle dorsiflexion    Ankle plantarflexion    Ankle inversion    Ankle eversion     (Blank rows = not tested) *= pain/symptoms  LOWER EXTREMITY MMT:  MMT Right eval Left eval  Hip flexion    Hip extension    Hip abduction    Hip adduction    Hip internal rotation    Hip external rotation    Knee flexion 5 5  Knee extension 5 4+  Ankle dorsiflexion    Ankle plantarflexion    Ankle inversion    Ankle eversion     (Blank rows = not tested) *= pain/symptoms   FUNCTIONAL TESTS:  NT Transfer: labored with UE support  GAIT: Distance walked: 100 feet Assistive device utilized: rollator Level of assistance: Modified independence Comments: slightly flexed trunk with rollator, antalgic on  L    TODAY'S TREATMENT:                                                                                                                              DATE:  02/03/24  Supine glute sets 2 x 10 Prone hamstring curl 3 x 10 Quadruped rocking 2 x 10 LAQ 3# 2 x 10 Gait mechanics with SPC Bike 5 minutes for hip mobility   01/26/24 Eval, HEP, education, rollator adjustment, gait mechanics   PATIENT EDUCATION:  Education details: Patient educated on exam findings, POC, scope of PT, HEP, relevant anatomy and biomechanics, compression garments, gait mechanics, and protocol/precautions. 02/03/24: HEP Person educated: Patient Education method: Explanation, Demonstration, and Handouts Education comprehension: verbalized understanding, returned demonstration, verbal cues required, and tactile cues required  HOME EXERCISE PROGRAM: Access Code: [email or web address] URL: [email or web address] Date: 01/26/2024 Prepared by: Prentice Tuvia Woodrick  Exercises - Gluteal Sets (Mirrored)  - 3 x daily - 7 x weekly - 2 sets - 10 reps - 5-10 second hold - Supine Quadricep Sets (Mirrored)  - 3 x daily - 7 x weekly - 2 sets - 10 reps - 5-10 second hold - Supine Ankle Pumps  - 5 x daily - 7 x weekly - 20 reps - Hooklying Gluteal Sets  - 3 x daily - 7 x weekly - 10 reps - 5-10 second hold - Supine Hip Adduction Isometric with Ball  - 3 x daily - 7 x weekly - 10 reps - 5-10 second hold  ASSESSMENT:  CLINICAL IMPRESSION: Patient overall doing very well with minimal to no pain. Continued with previously completed exercises and additional exercises performed today which are tolerated well. Cueing for proper SPC use with good mechanics. Patient will continue to benefit from physical therapy in order to improve function and reduce impairment.   OBJECTIVE IMPAIRMENTS: Abnormal gait, decreased activity tolerance, decreased balance, decreased endurance, decreased mobility, difficulty walking, decreased ROM, decreased  strength, increased muscle spasms, impaired flexibility, improper body mechanics, and pain  ACTIVITY LIMITATIONS: lifting, bending, sitting, standing, squatting, sleeping, stairs, transfers, locomotion level, and caring for others  PARTICIPATION LIMITATIONS: meal prep, cleaning, laundry, shopping, community activity, occupation, and yard work  PERSONAL FACTORS: Time since onset of injury/illness/exacerbation and 1-2 comorbidities: HLD, osteoporosis are also affecting patient's functional outcome.   REHAB POTENTIAL: Good  CLINICAL DECISION MAKING: Evolving/moderate complexity  EVALUATION COMPLEXITY: Moderate   GOALS: Goals reviewed with patient? Yes  SHORT TERM GOALS: Target date: 03/08/2024    Patient will be independent with HEP in order to improve functional outcomes. Baseline: Goal status: INITIAL  2.  Patient will report at least 25% improvement in symptoms for improved quality of life. Baseline: Goal status: INITIAL  3.  Pain free flexion to 90 Baseline:  Goal status: INITIAL  4.  SLS 30s without compensation Baseline:  Goal status: INITIAL   LONG TERM GOALS: Target date: 04/19/2024    Patient will report at least 75% improvement in symptoms for improved quality of life. Baseline:  Goal status: INITIAL  2.  Patient will improve LEFS score by at least 15 points in order to indicate improved tolerance to activity. Baseline:  Goal status: INITIAL  3.  Patient will be able to navigate stairs with reciprocal pattern without compensation in order to demonstrate improved LE strength. Baseline:  Goal status: INITIAL  4.  Patient will demonstrate MMT 80% of opposite lower extremity in all tested musculature as evidence of improved strength to assist with stair ambulation and gait. Baseline:  Goal status: INITIAL  5.  Patient will be able to return to all activities unrestricted for improved ability to perform work functions and participate with family.  Baseline:   Goal status: INITIAL    PLAN:  PT FREQUENCY:  1-2x/week  PT DURATION: 12 weeks  PLANNED INTERVENTIONS: 97164- PT Re-evaluation, 97110-Therapeutic exercises, 97530- Therapeutic activity, [id number]- Neuromuscular re-education, 97535- Self Care, [id number]- Manual therapy, [phone]- Gait training, [phone]- Orthotic Fit/training, [phone]- Canalith repositioning, [id number]- Aquatic Therapy, [phone]- Splinting, [phone]- Wound care (first 20 sq cm), 97598- Wound care (each additional 20 sq cm)Patient/Family education, Balance training, Stair training, Taping, Dry Needling, Joint mobilization, Joint manipulation, Spinal manipulation, Spinal mobilization, Scar mobilization, and DME instructions.  PLAN FOR NEXT SESSION: progress with glute tendon transfer protocol   Prentice GORMAN Stains, PT, DPT 02/03/2024, 12:34 PM

## 2024-02-09 ENCOUNTER — Ambulatory Visit (HOSPITAL_BASED_OUTPATIENT_CLINIC_OR_DEPARTMENT_OTHER): Admitting: Physical Therapy

## 2024-02-09 ENCOUNTER — Encounter (HOSPITAL_BASED_OUTPATIENT_CLINIC_OR_DEPARTMENT_OTHER): Payer: Self-pay | Admitting: Physical Therapy

## 2024-02-09 DIAGNOSIS — R2689 Other abnormalities of gait and mobility: Secondary | ICD-10-CM | POA: Diagnosis not present

## 2024-02-09 DIAGNOSIS — M25552 Pain in left hip: Secondary | ICD-10-CM | POA: Diagnosis not present

## 2024-02-09 DIAGNOSIS — M6281 Muscle weakness (generalized): Secondary | ICD-10-CM

## 2024-02-09 DIAGNOSIS — M25652 Stiffness of left hip, not elsewhere classified: Secondary | ICD-10-CM

## 2024-02-09 NOTE — Therapy (Signed)
 OUTPATIENT PHYSICAL THERAPY TREATMENT   Patient Name: Taylor Singleton MRN: [id number] DOB:[birth year], 68 y.o., female Today's Date: 02/09/2024  END OF SESSION:  PT End of Session - 02/09/24 1152     Visit Number 3    Number of Visits 24    Date for PT Re-Evaluation 04/19/24    Authorization Type [employer] MEDICARE    Progress Note Due on Visit 10    PT Start Time 1149    PT Stop Time 1228    PT Time Calculation (min) 39 min    Activity Tolerance Patient tolerated treatment well    Behavior During Therapy WFL for tasks assessed/performed          Past Medical History:  Diagnosis Date   Anxiety    Arthritis    Chronic idiopathic constipation    GERD (gastroesophageal reflux disease)    HLD (hyperlipidemia)    Hyperlipidemia    Osteoporosis    Prolapse of posterior vaginal wall    Sciatica    Vaginal vault 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    Varicose vein of leg    Wears glasses    Past Surgical History:  Procedure Laterality Date   ANTERIOR AND POSTERIOR REPAIR WITH SACROSPINOUS FIXATION N/A 07/19/2022   Procedure: POSTERIOR REPAIR WITHP PERINEORRHAPHY AND  SACROSPINOUS FIXATION;  Surgeon: Marilynne Rosaline SAILOR, MD;  Location: [facility] [place];  Service: Gynecology;  Laterality: N/A;  Total time requested is 1.5hrs   BREAST CYST EXCISION Left    CHOLECYSTECTOMY     CHOLECYSTECTOMY, LAPAROSCOPIC  2010   COLONOSCOPY WITH PROPOFOL  N/A 03/22/2023   Procedure: COLONOSCOPY WITH PROPOFOL ;  Surgeon: Eartha Angelia Sieving, MD;  Location: [facility];  Service: Gastroenterology;  Laterality: N/A;  8:45AM;ASA 1   GLUTEUS MINIMUS REPAIR Left 01/24/2024   Procedure: REPAIR, TENDON, GLUTEUS MINIMUS;  Surgeon: Genelle Standing, MD;  Location: [facility];  Service: Orthopedics;  Laterality: Left;  LEFT GLUTEUS MAXIMUS TENDON TRANSFER   TOTAL ABDOMINAL HYSTERECTOMY W/ BILATERAL SALPINGOOPHORECTOMY     1990s   Patient Active Problem List   Diagnosis Date Noted    Tear of left gluteus medius tendon 01/24/2024   Candidiasis, intertrigo 08/26/2023   Seborrheic keratoses 08/26/2023   Greater trochanteric pain syndrome 05/30/2023   Acute cystitis without hematuria 05/30/2023   Encounter for colorectal cancer screening 03/22/2023   Nausea without vomiting 03/17/2023   Flatulence 03/17/2023   S/P hysterectomy 10/06/2022   Urinary frequency 10/06/2022   Vaginal burning 10/06/2022   Vaginal irritation 10/06/2022   Vulvar irritation 10/06/2022   Sebaceous cyst of labia 10/06/2022   BPPV (benign paroxysmal positional vertigo), left 01/29/2022   Laryngopharyngeal reflux (LPR) 01/29/2022   Pharyngoesophageal dysphagia 01/29/2022   HLD (hyperlipidemia)     PCP: Duanne Butler DASEN, MD  REFERRING PROVIDER: Genelle Standing, MD  REFERRING DIAG: [phone] (ICD-10-CM) - Pain in left hip 1. Left hip gluteus maximus tendon transfer 2. Left hip trochanteric bursectomy; DOS 01/24/24  THERAPY DIAG:  Pain in left hip  Stiffness of left hip, not elsewhere classified  Other abnormalities of gait and mobility  Muscle weakness (generalized)  Rationale for Evaluation and Treatment: Rehabilitation  ONSET DATE: DOS 01/24/24  SUBJECTIVE:   SUBJECTIVE STATEMENT: Patient states stiff in the morning. Uses cane when up walking, no cane short distance in the house.   PERTINENT HISTORY: HLD, osteoporosis PAIN:  Are you having pain? Yes: NPRS scale: 2/10 Pain location: L hip Pain description: sore Aggravating factors: walking Relieving factors: rest  PRECAUTIONS: Other: Left hip gluteus maximus tendon transfer  WEIGHT BEARING RESTRICTIONS: WBAT  FALLS:  Has patient fallen in last 6 months? No  PLOF: Independent  PATIENT GOALS: be able to function normal again  OBJECTIVE: (objective measures from initial evaluation unless otherwise dated)  PATIENT SURVEYS:  LEFS  Extreme difficulty/unable (0), Quite a bit of difficulty (1), Moderate difficulty (2),  Little difficulty (3), No difficulty (4) Survey date:  01/26/24  Any of your usual work, housework or school activities 3  2. Usual hobbies, recreational or sporting activities 3  3. Getting into/out of the bath 0  4. Walking between rooms 4  5. Putting on socks/shoes 4  6. Squatting  4  7. Lifting an object, like a bag of groceries from the floor 4  8. Performing light activities around your home 3  9. Performing heavy activities around your home 3  10. Getting into/out of a car 4  11. Walking 2 blocks 3  12. Walking 1 mile 1  13. Going up/down 10 stairs (1 flight) 4  14. Standing for 1 hour 3  15.  sitting for 1 hour 4  16. Running on even ground 1  17. Running on uneven ground 1  18. Making sharp turns while running fast 1  19. Hopping  1  20. Rolling over in bed 3  Score total:  54/80     COGNITION: Overall cognitive status: Within functional limits for tasks assessed     SENSATION: WFL   POSTURE: rounded shoulders and forward head  PALPATION: Grossly tender lateral L hip  LOWER EXTREMITY ROM:  Passive ROM Right eval Left eval  Hip flexion  > 90  Hip extension    Hip abduction    Hip adduction    Hip internal rotation    Hip external rotation    Knee flexion    Knee extension    Ankle dorsiflexion    Ankle plantarflexion    Ankle inversion    Ankle eversion     (Blank rows = not tested) *= pain/symptoms  LOWER EXTREMITY MMT:  MMT Right eval Left eval  Hip flexion    Hip extension    Hip abduction    Hip adduction    Hip internal rotation    Hip external rotation    Knee flexion 5 5  Knee extension 5 4+  Ankle dorsiflexion    Ankle plantarflexion    Ankle inversion    Ankle eversion     (Blank rows = not tested) *= pain/symptoms   FUNCTIONAL TESTS:  NT Transfer: labored with UE support  GAIT: Distance walked: 100 feet Assistive device utilized: rollator Level of assistance: Modified independence Comments: slightly flexed trunk with  rollator, antalgic on L    TODAY'S TREATMENT:  DATE:  02/09/24 Bike 5 minutes for hip mobility Gait mechanics with SPC and cueing for glute activation LAQ 3# 3 x 10 Supine hamstring isometrics with green ball 10 x 5 second holds Supine glute sets 2 x 10  02/03/24 Supine glute sets 2 x 10 Prone hamstring curl 3 x 10 Quadruped rocking 2 x 10 LAQ 3# 2 x 10 Gait mechanics with SPC Bike 5 minutes for hip mobility   01/26/24 Eval, HEP, education, rollator adjustment, gait mechanics   PATIENT EDUCATION:  Education details: Patient educated on exam findings, POC, scope of PT, HEP, relevant anatomy and biomechanics, compression garments, gait mechanics, and protocol/precautions. 02/03/24: HEP Person educated: Patient Education method: Explanation, Demonstration, and Handouts Education comprehension: verbalized understanding, returned demonstration, verbal cues required, and tactile cues required  HOME EXERCISE PROGRAM: Access Code: [email or web address] URL: [email or web address] Date: 01/26/2024 Prepared by: Prentice Kaylynne Andres  Exercises - Gluteal Sets (Mirrored)  - 3 x daily - 7 x weekly - 2 sets - 10 reps - 5-10 second hold - Supine Quadricep Sets (Mirrored)  - 3 x daily - 7 x weekly - 2 sets - 10 reps - 5-10 second hold - Supine Ankle Pumps  - 5 x daily - 7 x weekly - 20 reps - Hooklying Gluteal Sets  - 3 x daily - 7 x weekly - 10 reps - 5-10 second hold - Supine Hip Adduction Isometric with Ball  - 3 x daily - 7 x weekly - 10 reps - 5-10 second hold  ASSESSMENT:  CLINICAL IMPRESSION: Patient overall doing very well with minimal to no pain. Continued with previously completed exercises and additional exercises performed today which are tolerated well. Cueing for proper SPC use with good mechanics. Patient will continue to benefit from physical therapy in  order to improve function and reduce impairment.   OBJECTIVE IMPAIRMENTS: Abnormal gait, decreased activity tolerance, decreased balance, decreased endurance, decreased mobility, difficulty walking, decreased ROM, decreased strength, increased muscle spasms, impaired flexibility, improper body mechanics, and pain  ACTIVITY LIMITATIONS: lifting, bending, sitting, standing, squatting, sleeping, stairs, transfers, locomotion level, and caring for others  PARTICIPATION LIMITATIONS: meal prep, cleaning, laundry, shopping, community activity, occupation, and yard work  PERSONAL FACTORS: Time since onset of injury/illness/exacerbation and 1-2 comorbidities: HLD, osteoporosis are also affecting patient's functional outcome.   REHAB POTENTIAL: Good  CLINICAL DECISION MAKING: Evolving/moderate complexity  EVALUATION COMPLEXITY: Moderate   GOALS: Goals reviewed with patient? Yes  SHORT TERM GOALS: Target date: 03/08/2024    Patient will be independent with HEP in order to improve functional outcomes. Baseline: Goal status: INITIAL  2.  Patient will report at least 25% improvement in symptoms for improved quality of life. Baseline: Goal status: INITIAL  3.  Pain free flexion to 90 Baseline:  Goal status: INITIAL  4.  SLS 30s without compensation Baseline:  Goal status: INITIAL   LONG TERM GOALS: Target date: 04/19/2024    Patient will report at least 75% improvement in symptoms for improved quality of life. Baseline:  Goal status: INITIAL  2.  Patient will improve LEFS score by at least 15 points in order to indicate improved tolerance to activity. Baseline:  Goal status: INITIAL  3.  Patient will be able to navigate stairs with reciprocal pattern without compensation in order to demonstrate improved LE strength. Baseline:  Goal status: INITIAL  4.  Patient will demonstrate MMT 80% of opposite lower extremity in all tested musculature as evidence of improved strength to  assist with stair ambulation and gait.  Baseline:  Goal status: INITIAL  5.  Patient will be able to return to all activities unrestricted for improved ability to perform work functions and participate with family.  Baseline:  Goal status: INITIAL    PLAN:  PT FREQUENCY: 1-2x/week  PT DURATION: 12 weeks  PLANNED INTERVENTIONS: 97164- PT Re-evaluation, 97110-Therapeutic exercises, 97530- Therapeutic activity, [id number]- Neuromuscular re-education, 97535- Self Care, 02859- Manual therapy, [id number]- Gait training, [phone]- Orthotic Fit/training, [phone]- Canalith repositioning, [id number]- Aquatic Therapy, [phone]- Splinting, [phone]- Wound care (first 20 sq cm), 97598- Wound care (each additional 20 sq cm)Patient/Family education, Balance training, Stair training, Taping, Dry Needling, Joint mobilization, Joint manipulation, Spinal manipulation, Spinal mobilization, Scar mobilization, and DME instructions.  PLAN FOR NEXT SESSION: progress with glute tendon transfer protocol   Prentice GORMAN Stains, PT, DPT 02/09/2024, 11:53 AM

## 2024-02-14 ENCOUNTER — Telehealth (HOSPITAL_BASED_OUTPATIENT_CLINIC_OR_DEPARTMENT_OTHER): Payer: Self-pay | Admitting: Orthopaedic Surgery

## 2024-02-14 NOTE — Telephone Encounter (Signed)
 Patient wants to come in to see DR B tomorrow please advise. She said she is really in pain and her leg is swollen.

## 2024-02-15 ENCOUNTER — Ambulatory Visit (INDEPENDENT_AMBULATORY_CARE_PROVIDER_SITE_OTHER): Admitting: Orthopaedic Surgery

## 2024-02-15 ENCOUNTER — Encounter (HOSPITAL_BASED_OUTPATIENT_CLINIC_OR_DEPARTMENT_OTHER): Payer: Self-pay

## 2024-02-15 ENCOUNTER — Ambulatory Visit (HOSPITAL_BASED_OUTPATIENT_CLINIC_OR_DEPARTMENT_OTHER): Attending: Orthopaedic Surgery

## 2024-02-15 ENCOUNTER — Ambulatory Visit (HOSPITAL_BASED_OUTPATIENT_CLINIC_OR_DEPARTMENT_OTHER): Payer: Self-pay

## 2024-02-15 DIAGNOSIS — M7632 Iliotibial band syndrome, left leg: Secondary | ICD-10-CM | POA: Diagnosis not present

## 2024-02-15 DIAGNOSIS — M25652 Stiffness of left hip, not elsewhere classified: Secondary | ICD-10-CM | POA: Diagnosis not present

## 2024-02-15 DIAGNOSIS — M6281 Muscle weakness (generalized): Secondary | ICD-10-CM | POA: Insufficient documentation

## 2024-02-15 DIAGNOSIS — R2689 Other abnormalities of gait and mobility: Secondary | ICD-10-CM | POA: Insufficient documentation

## 2024-02-15 DIAGNOSIS — M25552 Pain in left hip: Secondary | ICD-10-CM | POA: Diagnosis not present

## 2024-02-15 DIAGNOSIS — S76012A Strain of muscle, fascia and tendon of left hip, initial encounter: Secondary | ICD-10-CM

## 2024-02-15 MED ORDER — LIDOCAINE HCL 1 % IJ SOLN
4.0000 mL | INTRAMUSCULAR | Status: AC | PRN
  Administered 2024-02-15: 4 mL

## 2024-02-15 MED ORDER — TRIAMCINOLONE ACETONIDE 40 MG/ML IJ SUSP
80.0000 mg | INTRAMUSCULAR | Status: AC | PRN
  Administered 2024-02-15: 80 mg via INTRA_ARTICULAR

## 2024-02-15 NOTE — Progress Notes (Signed)
 Post Operative Evaluation    Procedure/Date of Surgery: Left hip gluteus maximus tendon transfer 8/12  Interval History:    Presents 3 weeks status post the above procedure.  She is having some soreness about the distal IT band.  Physical therapy is coming along nicely.  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   PMH/PSH/Family History/Social History/Meds/Allergies:    Past Medical History:  Diagnosis Date  . Anxiety   . Arthritis   . Chronic idiopathic constipation   . GERD (gastroesophageal reflux disease)   . HLD (hyperlipidemia)   . Hyperlipidemia   . Osteoporosis   . Prolapse of posterior vaginal wall   . Sciatica   . Vaginal vault 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   . Varicose vein of leg   . Wears glasses    Past Surgical History:  Procedure Laterality Date  . ANTERIOR AND POSTERIOR REPAIR WITH SACROSPINOUS FIXATION N/A 07/19/2022   Procedure: POSTERIOR REPAIR WITHP PERINEORRHAPHY AND  SACROSPINOUS FIXATION;  Surgeon: Marilynne Rosaline SAILOR, MD;  Location: [facility];  Service: Gynecology;  Laterality: N/A;  Total time requested is 1.5hrs  . BREAST CYST EXCISION Left   . CHOLECYSTECTOMY    . CHOLECYSTECTOMY, LAPAROSCOPIC  2010  . COLONOSCOPY WITH PROPOFOL  N/A 03/22/2023   Procedure: COLONOSCOPY WITH PROPOFOL ;  Surgeon: Eartha Angelia Sieving, MD;  [facility];  Service: Gastroenterology;  Laterality: N/A;  8:45AM;ASA 1  . GLUTEUS MINIMUS REPAIR Left 01/24/2024   Procedure: REPAIR, TENDON, GLUTEUS MINIMUS;  Surgeon: Genelle Standing, MD;  Location: [facility];  Service: Orthopedics;  Laterality: Left;  LEFT GLUTEUS MAXIMUS TENDON TRANSFER  . TOTAL ABDOMINAL HYSTERECTOMY W/ BILATERAL SALPINGOOPHORECTOMY     1990s   Social History   Socioeconomic History  . Marital status: Married    Spouse name: Not on file  . Number of children: 2  . Years of education: Not on  file  . Highest education level: Not on file  Occupational History  . Not on file  Tobacco Use  . Smoking status: Never  . Smokeless tobacco: Never  Vaping Use  . Vaping status: Never Used  Substance and Sexual Activity  . Alcohol use: No  . Drug use: Never  . Sexual activity: Not Currently    Birth control/protection: Surgical    Comment: hyst  Other Topics Concern  . Not on file  Social History Narrative  . Not on file   Social Drivers of Health   Financial Resource Strain: Low Risk  (11/24/2023)   Overall Financial Resource Strain (CARDIA)   . Difficulty of Paying Living Expenses: Not hard at all  Food Insecurity: No Food Insecurity (11/24/2023)   Hunger Vital Sign   . Worried About Programme researcher, broadcasting/film/video in the Last Year: Never true   . Ran Out of Food in the Last Year: Never true  Transportation Needs: No Transportation Needs (11/24/2023)   PRAPARE - Transportation   . Lack of Transportation (Medical): No   . Lack of Transportation (Non-Medical): No  Physical Activity: Inactive (11/24/2023)   Exercise Vital Sign   . Days of Exercise per Week: 0 days   . Minutes of Exercise per Session: 0 min  Stress: No Stress Concern Present (11/24/2023)   [employer] of Occupational Health - Occupational  Stress Questionnaire   . Feeling of Stress: Not at all  Social Connections: Moderately Isolated (11/24/2023)   Social Connection and Isolation Panel   . Frequency of Communication with Friends and Family: Three times a week   . Frequency of Social Gatherings with Friends and Family: More than three times a week   . Attends Religious Services: Never   . Active Member of Clubs or Organizations: No   . Attends Banker Meetings: Never   . Marital Status: Married   Family History  Problem Relation Age of Onset  . Diabetes Mother   . Heart attack Brother   . Breast cancer Neg Hx    No Known Allergies Current Outpatient Medications  Medication Sig Dispense Refill  .  aspirin  EC 325 MG tablet Take 1 tablet (325 mg total) by mouth daily. 14 tablet 0  . atorvastatin  (LIPITOR) 40 MG tablet Take 1 tablet (40 mg total) by mouth daily. 90 tablet 2  . Calcium  Carb-Cholecalciferol (CALCIUM /VITAMIN D) 600-400 MG-UNIT TABS Take 2 tablets by mouth daily.    . clonazePAM  (KLONOPIN ) 0.5 MG tablet Take 1 tablet (0.5 mg total) by mouth 2 (two) times daily as needed for anxiety. (Patient not taking: Reported on 01/17/2024) 20 tablet 1  . oxybutynin  (DITROPAN  XL) 5 MG 24 hr tablet Take 1 tablet (5 mg total) by mouth at bedtime. 30 tablet 11  . oxyCODONE  (ROXICODONE ) 5 MG immediate release tablet Take 1 tablet (5 mg total) by mouth every 4 (four) hours as needed for severe pain (pain score 7-10) or breakthrough pain. 30 tablet 0  . pantoprazole  (PROTONIX ) 40 MG tablet Take 1 tablet (40 mg total) by mouth daily. 90 tablet 2  . psyllium (REGULOID) 0.52 g capsule Take 0.52 g by mouth daily.    . sertraline  (ZOLOFT ) 50 MG tablet Take 1 tablet (50 mg total) by mouth daily. 30 tablet 3   No current facility-administered medications for this visit.   No results found.  Review of Systems:   A ROS was performed including pertinent positives and negatives as documented in the HPI.   Musculoskeletal Exam:    There were no vitals taken for this visit.  Left hip is well-appearing without erythema or drainage.  Incision is well-appearing 30 degrees internal/external rotation of the hip without pain  Imaging:      I personally reviewed and interpreted the radiographs.   Assessment:   Presents today status post 2 weeks of gluteus maximus tendon transfer.  Today she is having evidence of IT band symptoms for which I recommended ultrasound-guided injection.  She would like to proceed with this.  She is having some Symptoms which there does not appear to be a palpable cord or any type of positive Toula' sign.  Given this and her history of venous stasis/varicose veins I have  recommended follow-up with vein and vascular  Plan :    - Return to clinic 6 weeks for reassessment    Procedure Note  Patient: Taylor Singleton             Date of Birth: 12-14-1955           MRN: [id number]             Visit Date: 02/15/2024  Procedures: Visit Diagnoses:  1. Tear of left gluteus medius tendon, initial encounter     Large Joint Inj: L knee on 02/15/2024 9:20 AM Indications: pain Details: 22 G 1.5 in needle, ultrasound-guided anterior approach  Arthrogram: No  Medications: 4 mL lidocaine  1 %; 80 mg triamcinolone  acetonide 40 MG/ML Outcome: tolerated well, no immediate complications Procedure, treatment alternatives, risks and benefits explained, specific risks discussed. Consent was given by the patient. Immediately prior to procedure a time out was called to verify the correct patient, procedure, equipment, support staff and site/side marked as required. Patient was prepped and draped in the usual sterile fashion.            I personally saw and evaluated the patient, and participated in the management and treatment plan.  Elspeth Parker, MD Attending Physician, Orthopedic Surgery  This document was dictated using Dragon voice recognition software. A reasonable attempt at proof reading has been made to minimize errors.

## 2024-02-15 NOTE — Therapy (Signed)
 OUTPATIENT PHYSICAL THERAPY TREATMENT   Patient Name: Taylor Singleton MRN: [id number] DOB:Jun 10, 1956, 68 y.o., female Today's Date: 02/15/2024  END OF SESSION:  PT End of Session - 02/15/24 0935     Visit Number 4    Number of Visits 24    Date for PT Re-Evaluation 04/19/24    Authorization Type BCBS MEDICARE    Progress Note Due on Visit 10    PT Start Time 0932    PT Stop Time 1012    PT Time Calculation (min) 40 min    Activity Tolerance Patient tolerated treatment well    Behavior During Therapy WFL for tasks assessed/performed           Past Medical History:  Diagnosis Date   Anxiety    Arthritis    Chronic idiopathic constipation    GERD (gastroesophageal reflux disease)    HLD (hyperlipidemia)    Hyperlipidemia    Osteoporosis    Prolapse of posterior vaginal wall    Sciatica    Vaginal vault 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    Varicose vein of leg    Wears glasses    Past Surgical History:  Procedure Laterality Date   ANTERIOR AND POSTERIOR REPAIR WITH SACROSPINOUS FIXATION N/A 07/19/2022   Procedure: POSTERIOR REPAIR WITHP PERINEORRHAPHY AND  SACROSPINOUS FIXATION;  Surgeon: Marilynne Rosaline SAILOR, MD;  Location: [facility];  Service: Gynecology;  Laterality: N/A;  Total time requested is 1.5hrs   BREAST CYST EXCISION Left    CHOLECYSTECTOMY     CHOLECYSTECTOMY, LAPAROSCOPIC  2010   COLONOSCOPY WITH PROPOFOL  N/A 03/22/2023   Procedure: COLONOSCOPY WITH PROPOFOL ;  Surgeon: Eartha Angelia Sieving, MD;  [facility];  Service: Gastroenterology;  Laterality: N/A;  8:45AM;ASA 1   GLUTEUS MINIMUS REPAIR Left 01/24/2024   Procedure: REPAIR, TENDON, GLUTEUS MINIMUS;  Surgeon: Genelle Standing, MD;  Location: [facility];  Service: Orthopedics;  Laterality: Left;  LEFT GLUTEUS MAXIMUS TENDON TRANSFER   TOTAL ABDOMINAL HYSTERECTOMY W/ BILATERAL SALPINGOOPHORECTOMY     1990s   Patient Active Problem List   Diagnosis Date Noted    Tear of left gluteus medius tendon 01/24/2024   Candidiasis, intertrigo 08/26/2023   Seborrheic keratoses 08/26/2023   Greater trochanteric pain syndrome 05/30/2023   Acute cystitis without hematuria 05/30/2023   Encounter for colorectal cancer screening 03/22/2023   Nausea without vomiting 03/17/2023   Flatulence 03/17/2023   S/P hysterectomy 10/06/2022   Urinary frequency 10/06/2022   Vaginal burning 10/06/2022   Vaginal irritation 10/06/2022   Vulvar irritation 10/06/2022   Sebaceous cyst of labia 10/06/2022   BPPV (benign paroxysmal positional vertigo), left 01/29/2022   Laryngopharyngeal reflux (LPR) 01/29/2022   Pharyngoesophageal dysphagia 01/29/2022   HLD (hyperlipidemia)     PCP: Duanne Butler DASEN, MD  REFERRING PROVIDER: Genelle Standing, MD  REFERRING DIAG: [phone] (ICD-10-CM) - Pain in left hip 1. Left hip gluteus maximus tendon transfer 2. Left hip trochanteric bursectomy; DOS 01/24/24  THERAPY DIAG:  Pain in left hip  Stiffness of left hip, not elsewhere classified  Other abnormalities of gait and mobility  Muscle weakness (generalized)  Rationale for Evaluation and Treatment: Rehabilitation  ONSET DATE: DOS 01/24/24  SUBJECTIVE:   SUBJECTIVE STATEMENT: Pt arrive with SPC. She is having some bil ankle swelling and wants to see vein specialist. Reports compliance with HEP. Had an injection into distal ITB today. I came down to cancel my PT appt for tomorrow, but then they offered me this appointment.  PERTINENT  HISTORY: HLD, osteoporosis PAIN:  Are you having pain? Yes: NPRS scale: 2/10 Pain location: L hip Pain description: sore Aggravating factors: walking Relieving factors: rest  PRECAUTIONS: Other: Left hip gluteus maximus tendon transfer  WEIGHT BEARING RESTRICTIONS: WBAT  FALLS:  Has patient fallen in last 6 months? No  PLOF: Independent  PATIENT GOALS: be able to function normal again  OBJECTIVE: (objective measures from initial  evaluation unless otherwise dated)  PATIENT SURVEYS:  LEFS  Extreme difficulty/unable (0), Quite a bit of difficulty (1), Moderate difficulty (2), Little difficulty (3), No difficulty (4) Survey date:  01/26/24  Any of your usual work, housework or school activities 3  2. Usual hobbies, recreational or sporting activities 3  3. Getting into/out of the bath 0  4. Walking between rooms 4  5. Putting on socks/shoes 4  6. Squatting  4  7. Lifting an object, like a bag of groceries from the floor 4  8. Performing light activities around your home 3  9. Performing heavy activities around your home 3  10. Getting into/out of a car 4  11. Walking 2 blocks 3  12. Walking 1 mile 1  13. Going up/down 10 stairs (1 flight) 4  14. Standing for 1 hour 3  15.  sitting for 1 hour 4  16. Running on even ground 1  17. Running on uneven ground 1  18. Making sharp turns while running fast 1  19. Hopping  1  20. Rolling over in bed 3  Score total:  54/80     COGNITION: Overall cognitive status: Within functional limits for tasks assessed     SENSATION: WFL   POSTURE: rounded shoulders and forward head  PALPATION: Grossly tender lateral L hip  LOWER EXTREMITY ROM:  Passive ROM Right eval Left eval  Hip flexion  > 90  Hip extension    Hip abduction    Hip adduction    Hip internal rotation    Hip external rotation    Knee flexion    Knee extension    Ankle dorsiflexion    Ankle plantarflexion    Ankle inversion    Ankle eversion     (Blank rows = not tested) *= pain/symptoms  LOWER EXTREMITY MMT:  MMT Right eval Left eval  Hip flexion    Hip extension    Hip abduction    Hip adduction    Hip internal rotation    Hip external rotation    Knee flexion 5 5  Knee extension 5 4+  Ankle dorsiflexion    Ankle plantarflexion    Ankle inversion    Ankle eversion     (Blank rows = not tested) *= pain/symptoms   FUNCTIONAL TESTS:  NT Transfer: labored with UE  support  GAIT: Distance walked: 100 feet Assistive device utilized: rollator Level of assistance: Modified independence Comments: slightly flexed trunk with rollator, antalgic on L    TODAY'S TREATMENT:  DATE:  02/15/24 Bike 5 minutes for hip mobility PROM L hip within protocol limits Gait with SPC- x130ft. Cues for increased step length LAQ 3 x 10 Partial bridges 2x10 Hooklying ball squeeze 2x10 5 hold Standing HR 2x10 Seated marching 2x10 (trialled standing, but too painful) Discussion of precautions/restrictions, activity modifications.     02/09/24 Bike 5 minutes for hip mobility Gait mechanics with SPC and cueing for glute activation LAQ 3# 3 x 10 Supine hamstring isometrics with green ball 10 x 5 second holds Supine glute sets 2 x 10  02/03/24 Supine glute sets 2 x 10 Prone hamstring curl 3 x 10 Quadruped rocking 2 x 10 LAQ 3# 2 x 10 Gait mechanics with SPC Bike 5 minutes for hip mobility   01/26/24 Eval, HEP, education, rollator adjustment, gait mechanics   PATIENT EDUCATION:  Education details: Patient educated on exam findings, POC, scope of PT, HEP, relevant anatomy and biomechanics, compression garments, gait mechanics, and protocol/precautions. 02/03/24: HEP Person educated: Patient Education method: Explanation, Demonstration, and Handouts Education comprehension: verbalized understanding, returned demonstration, verbal cues required, and tactile cues required  HOME EXERCISE PROGRAM: Access Code: [email or web address] URL: [email or web address] Date: 01/26/2024 Prepared by: Prentice Zaunegger  Exercises - Gluteal Sets (Mirrored)  - 3 x daily - 7 x weekly - 2 sets - 10 reps - 5-10 second hold - Supine Quadricep Sets (Mirrored)  - 3 x daily - 7 x weekly - 2 sets - 10 reps - 5-10 second hold - Supine Ankle Pumps  - 5 x daily - 7 x  weekly - 20 reps - Hooklying Gluteal Sets  - 3 x daily - 7 x weekly - 10 reps - 5-10 second hold - Supine Hip Adduction Isometric with Ball  - 3 x daily - 7 x weekly - 10 reps - 5-10 second hold  ASSESSMENT:  CLINICAL IMPRESSION: Continued to work on posture with gait and standing tasks. Decreased cane height to aid with amount of elbow flexion and upper trap hiking. Required cues for increased step length and demonstrated good stability in [facility] in clinic setting. Good tolerance for gentle hip strengthening while careful to avoid pain limitations. Felt pain with standing marching, so performed seated with improved tolerance. Reviewed and educated pt on protocol, healing timeline, and activity restrictions/modifications. Will continue to progress as tolerated.   OBJECTIVE IMPAIRMENTS: Abnormal gait, decreased activity tolerance, decreased balance, decreased endurance, decreased mobility, difficulty walking, decreased ROM, decreased strength, increased muscle spasms, impaired flexibility, improper body mechanics, and pain  ACTIVITY LIMITATIONS: lifting, bending, sitting, standing, squatting, sleeping, stairs, transfers, locomotion level, and caring for others  PARTICIPATION LIMITATIONS: meal prep, cleaning, laundry, shopping, community activity, occupation, and yard work  PERSONAL FACTORS: Time since onset of injury/illness/exacerbation and 1-2 comorbidities: HLD, osteoporosis are also affecting patient's functional outcome.   REHAB POTENTIAL: Good  CLINICAL DECISION MAKING: Evolving/moderate complexity  EVALUATION COMPLEXITY: Moderate   GOALS: Goals reviewed with patient? Yes  SHORT TERM GOALS: Target date: 03/08/2024    Patient will be independent with HEP in order to improve functional outcomes. Baseline: Goal status: INITIAL  2.  Patient will report at least 25% improvement in symptoms for improved quality of life. Baseline: Goal status: INITIAL  3.  Pain free flexion to  90 Baseline:  Goal status: INITIAL  4.  SLS 30s without compensation Baseline:  Goal status: INITIAL   LONG TERM GOALS: Target date: 04/19/2024    Patient will report at least 75% improvement in symptoms for improved quality of life.  Baseline:  Goal status: INITIAL  2.  Patient will improve LEFS score by at least 15 points in order to indicate improved tolerance to activity. Baseline:  Goal status: INITIAL  3.  Patient will be able to navigate stairs with reciprocal pattern without compensation in order to demonstrate improved LE strength. Baseline:  Goal status: INITIAL  4.  Patient will demonstrate MMT 80% of opposite lower extremity in all tested musculature as evidence of improved strength to assist with stair ambulation and gait. Baseline:  Goal status: INITIAL  5.  Patient will be able to return to all activities unrestricted for improved ability to perform work functions and participate with family.  Baseline:  Goal status: INITIAL    PLAN:  PT FREQUENCY: 1-2x/week  PT DURATION: 12 weeks  PLANNED INTERVENTIONS: [id number]- PT Re-evaluation, 97110-Therapeutic exercises, 97530- Therapeutic activity, [id number]- Neuromuscular re-education, 97535- Self Care, [id number]- Manual therapy, [id number]- Gait training, [phone]- Orthotic Fit/training, [phone]- Canalith repositioning, [id number]- Aquatic Therapy, [phone]- Splinting, [phone]- Wound care (first 20 sq cm), 97598- Wound care (each additional 20 sq cm)Patient/Family education, Balance training, Stair training, Taping, Dry Needling, Joint mobilization, Joint manipulation, Spinal manipulation, Spinal mobilization, Scar mobilization, and DME instructions.  PLAN FOR NEXT SESSION: progress with glute tendon transfer protocol   Asberry BRAVO Lamoyne Hessel, PTA 02/15/2024, 11:57 AM

## 2024-02-16 ENCOUNTER — Encounter (HOSPITAL_BASED_OUTPATIENT_CLINIC_OR_DEPARTMENT_OTHER): Admitting: Physical Therapy

## 2024-02-16 ENCOUNTER — Ambulatory Visit (INDEPENDENT_AMBULATORY_CARE_PROVIDER_SITE_OTHER): Admitting: Family Medicine

## 2024-02-16 ENCOUNTER — Encounter: Payer: Self-pay | Admitting: Family Medicine

## 2024-02-16 VITALS — BP 132/72 | HR 86 | Temp 98.4°F | Ht 61.0 in | Wt 150.4 lb

## 2024-02-16 DIAGNOSIS — N3000 Acute cystitis without hematuria: Secondary | ICD-10-CM | POA: Diagnosis not present

## 2024-02-16 LAB — URINALYSIS, ROUTINE W REFLEX MICROSCOPIC
Glucose, UA: NEGATIVE
Nitrite: POSITIVE — AB
Specific Gravity, Urine: 1.02 (ref 1.001–1.035)
pH: 6 (ref 5.0–8.0)

## 2024-02-16 LAB — MICROSCOPIC MESSAGE

## 2024-02-16 MED ORDER — SULFAMETHOXAZOLE-TRIMETHOPRIM 800-160 MG PO TABS: 1.0000 | ORAL_TABLET | Freq: Two times a day (BID) | ORAL | 0 refills | Status: AC

## 2024-02-16 NOTE — Progress Notes (Signed)
 Subjective:    Patient ID: Taylor Singleton, female    DOB: Dec 02, 1955, 68 y.o.   MRN: [id number] Recently had orthopedic surgery.  Reports frequency and urgency.  On oxybutynin .  Has foul odor.  Feels weak and tired with chills like she is getting a bug.  Also has severe dry mouth.  UA shows +3 LE, + nitrates, trace blood.   Past Medical History:  Diagnosis Date   Anxiety    Arthritis    Chronic idiopathic constipation    GERD (gastroesophageal reflux disease)    HLD (hyperlipidemia)    Hyperlipidemia    Osteoporosis    Prolapse of posterior vaginal wall    Sciatica    Vaginal vault 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    Varicose vein of leg    Wears glasses    Past Surgical History:  Procedure Laterality Date   ANTERIOR AND POSTERIOR REPAIR WITH SACROSPINOUS FIXATION N/A 07/19/2022   Procedure: POSTERIOR REPAIR WITHP PERINEORRHAPHY AND  SACROSPINOUS FIXATION;  Surgeon: Marilynne Rosaline SAILOR, MD;  Location: [facility];  Service: Gynecology;  Laterality: N/A;  Total time requested is 1.5hrs   BREAST CYST EXCISION Left    CHOLECYSTECTOMY     CHOLECYSTECTOMY, LAPAROSCOPIC  2010   COLONOSCOPY WITH PROPOFOL  N/A 03/22/2023   Procedure: COLONOSCOPY WITH PROPOFOL ;  Surgeon: Eartha Angelia Sieving, MD;  [facility];  Service: Gastroenterology;  Laterality: N/A;  8:45AM;ASA 1   GLUTEUS MINIMUS REPAIR Left 01/24/2024   Procedure: REPAIR, TENDON, GLUTEUS MINIMUS;  Surgeon: Genelle Standing, MD;  Location: [facility];  Service: Orthopedics;  Laterality: Left;  LEFT GLUTEUS MAXIMUS TENDON TRANSFER   TOTAL ABDOMINAL HYSTERECTOMY W/ BILATERAL SALPINGOOPHORECTOMY     1990s   Current Outpatient Medications on File Prior to Visit  Medication Sig Dispense Refill   atorvastatin  (LIPITOR) 40 MG tablet Take 1 tablet (40 mg total) by mouth daily. 90 tablet 2   Calcium  Carb-Cholecalciferol (CALCIUM /VITAMIN D) 600-400 MG-UNIT TABS Take 2 tablets by mouth daily.      oxybutynin  (DITROPAN  XL) 5 MG 24 hr tablet Take 1 tablet (5 mg total) by mouth at bedtime. 30 tablet 11   pantoprazole  (PROTONIX ) 40 MG tablet Take 1 tablet (40 mg total) by mouth daily. 90 tablet 2   psyllium (REGULOID) 0.52 g capsule Take 0.52 g by mouth daily.     sertraline  (ZOLOFT ) 50 MG tablet Take 1 tablet (50 mg total) by mouth daily. 30 tablet 3   aspirin  EC 325 MG tablet Take 1 tablet (325 mg total) by mouth daily. (Patient not taking: Reported on 02/16/2024) 14 tablet 0   clonazePAM  (KLONOPIN ) 0.5 MG tablet Take 1 tablet (0.5 mg total) by mouth 2 (two) times daily as needed for anxiety. (Patient not taking: Reported on 01/17/2024) 20 tablet 1   oxyCODONE  (ROXICODONE ) 5 MG immediate release tablet Take 1 tablet (5 mg total) by mouth every 4 (four) hours as needed for severe pain (pain score 7-10) or breakthrough pain. (Patient not taking: Reported on 02/16/2024) 30 tablet 0   No current facility-administered medications on file prior to visit.   No Known Allergies Social History   Socioeconomic History   Marital status: Married    Spouse name: Not on file   Number of children: 2   Years of education: Not on file   Highest education level: Not on file  Occupational History   Not on file  Tobacco Use   Smoking status: Never   Smokeless tobacco: Never  Vaping Use   Vaping status: Never Used  Substance and Sexual Activity   Alcohol use: No   Drug use: Never   Sexual activity: Not Currently    Birth control/protection: Surgical    Comment: hyst  Other Topics Concern   Not on file  Social History Narrative   Not on file   Social Drivers of Health   Financial Resource Strain: Low Risk  (11/24/2023)   Overall Financial Resource Strain (CARDIA)    Difficulty of Paying Living Expenses: Not hard at all  Food Insecurity: No Food Insecurity (11/24/2023)   Hunger Vital Sign    Worried About Running Out of Food in the Last Year: Never true    Ran Out of Food in the Last Year: Never  true  Transportation Needs: No Transportation Needs (11/24/2023)   PRAPARE - Administrator, Civil Service (Medical): No    Lack of Transportation (Non-Medical): No  Physical Activity: Inactive (11/24/2023)   Exercise Vital Sign    Days of Exercise per Week: 0 days    Minutes of Exercise per Session: 0 min  Stress: No Stress Concern Present (11/24/2023)   [employer] of Occupational Health - Occupational Stress Questionnaire    Feeling of Stress: Not at all  Social Connections: Moderately Isolated (11/24/2023)   Social Connection and Isolation Panel    Frequency of Communication with Friends and Family: Three times a week    Frequency of Social Gatherings with Friends and Family: More than three times a week    Attends Religious Services: Never    Database administrator or Organizations: No    Attends Banker Meetings: Never    Marital Status: Married  Catering manager Violence: Not At Risk (11/24/2023)   Humiliation, Afraid, Rape, and Kick questionnaire    Fear of Current or Ex-Partner: No    Emotionally Abused: No    Physically Abused: No    Sexually Abused: No     Review of Systems  All other systems reviewed and are negative.      Objective:   Physical Exam Constitutional:      General: She is not in acute distress.    Appearance: Normal appearance. She is well-developed and normal weight. She is not ill-appearing or toxic-appearing.  Cardiovascular:     Rate and Rhythm: Normal rate and regular rhythm.     Heart sounds: Normal heart sounds.  Pulmonary:     Effort: Pulmonary effort is normal. No respiratory distress.     Breath sounds: No wheezing or rales.  Musculoskeletal:     Cervical back: Neck supple.  Lymphadenopathy:     Cervical: No cervical adenopathy.  Skin:    Findings: No rash.  Neurological:     Mental Status: She is alert and oriented to person, place, and time.     Cranial Nerves: No dysarthria or facial asymmetry.      Motor: No tremor, atrophy, abnormal muscle tone or seizure activity.     Coordination: Romberg sign negative. Finger-Nose-Finger Test normal.  Psychiatric:        Mood and Affect: Mood normal.        Behavior: Behavior normal.        Thought Content: Thought content normal.        Judgment: Judgment normal.           Assessment & Plan:  Acute cystitis without hematuria - Plan: Urinalysis, Routine w reflex microscopic DC oxybutynin  due to xerostomia.  Start bactrim  ds bid for 7 days for UTI.  Recheck in 1 week.  Consider myrbetriq  for OAB if needed.

## 2024-02-20 ENCOUNTER — Ambulatory Visit: Admitting: Family Medicine

## 2024-02-20 ENCOUNTER — Encounter (HOSPITAL_BASED_OUTPATIENT_CLINIC_OR_DEPARTMENT_OTHER): Payer: Self-pay

## 2024-02-20 ENCOUNTER — Ambulatory Visit (HOSPITAL_BASED_OUTPATIENT_CLINIC_OR_DEPARTMENT_OTHER)

## 2024-02-20 ENCOUNTER — Encounter: Payer: Self-pay | Admitting: Family Medicine

## 2024-02-20 VITALS — BP 132/72 | HR 74 | Temp 98.6°F | Ht 61.0 in | Wt 149.2 lb

## 2024-02-20 DIAGNOSIS — N3000 Acute cystitis without hematuria: Secondary | ICD-10-CM

## 2024-02-20 DIAGNOSIS — M6281 Muscle weakness (generalized): Secondary | ICD-10-CM | POA: Diagnosis not present

## 2024-02-20 DIAGNOSIS — R2689 Other abnormalities of gait and mobility: Secondary | ICD-10-CM | POA: Diagnosis not present

## 2024-02-20 DIAGNOSIS — F419 Anxiety disorder, unspecified: Secondary | ICD-10-CM | POA: Diagnosis not present

## 2024-02-20 DIAGNOSIS — M25552 Pain in left hip: Secondary | ICD-10-CM

## 2024-02-20 DIAGNOSIS — M25652 Stiffness of left hip, not elsewhere classified: Secondary | ICD-10-CM | POA: Diagnosis not present

## 2024-02-20 DIAGNOSIS — R6883 Chills (without fever): Secondary | ICD-10-CM

## 2024-02-20 LAB — URINALYSIS, ROUTINE W REFLEX MICROSCOPIC
Bilirubin Urine: NEGATIVE
Glucose, UA: NEGATIVE
Ketones, ur: NEGATIVE
Leukocytes,Ua: NEGATIVE
Nitrite: NEGATIVE
Protein, ur: NEGATIVE
Specific Gravity, Urine: 1.021 (ref 1.001–1.035)
pH: 6 (ref 5.0–8.0)

## 2024-02-20 LAB — MICROSCOPIC MESSAGE

## 2024-02-20 NOTE — Therapy (Signed)
 OUTPATIENT PHYSICAL THERAPY TREATMENT   Patient Name: Taylor Singleton MRN: [id number] DOB:October 08, 1955, 68 y.o., female Today's Date: 02/20/2024  END OF SESSION:  PT End of Session - 02/20/24 1147     Visit Number 5    Number of Visits 24    Date for PT Re-Evaluation 04/19/24    Authorization Type BCBS MEDICARE    Progress Note Due on Visit 10    PT Start Time 1145    PT Stop Time 1224    PT Time Calculation (min) 39 min    Activity Tolerance Patient tolerated treatment well    Behavior During Therapy WFL for tasks assessed/performed            Past Medical History:  Diagnosis Date   Anxiety    Arthritis    Chronic idiopathic constipation    GERD (gastroesophageal reflux disease)    HLD (hyperlipidemia)    Hyperlipidemia    Osteoporosis    Prolapse of posterior vaginal wall    Sciatica    Vaginal vault 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    Varicose vein of leg    Wears glasses    Past Surgical History:  Procedure Laterality Date   ANTERIOR AND POSTERIOR REPAIR WITH SACROSPINOUS FIXATION N/A 07/19/2022   Procedure: POSTERIOR REPAIR WITHP PERINEORRHAPHY AND  SACROSPINOUS FIXATION;  Surgeon: Marilynne Rosaline SAILOR, MD;  Location: [facility];  Service: Gynecology;  Laterality: N/A;  Total time requested is 1.5hrs   BREAST CYST EXCISION Left    CHOLECYSTECTOMY     CHOLECYSTECTOMY, LAPAROSCOPIC  2010   COLONOSCOPY WITH PROPOFOL  N/A 03/22/2023   Procedure: COLONOSCOPY WITH PROPOFOL ;  Surgeon: Eartha Angelia Sieving, MD;  Location: [facility];  Service: Gastroenterology;  Laterality: N/A;  8:45AM;ASA 1   GLUTEUS MINIMUS REPAIR Left 01/24/2024   Procedure: REPAIR, TENDON, GLUTEUS MINIMUS;  Surgeon: Genelle Standing, MD;  Location: [facility];  Service: Orthopedics;  Laterality: Left;  LEFT GLUTEUS MAXIMUS TENDON TRANSFER   TOTAL ABDOMINAL HYSTERECTOMY W/ BILATERAL SALPINGOOPHORECTOMY     1990s   Patient Active Problem List   Diagnosis Date Noted    Tear of left gluteus medius tendon 01/24/2024   Candidiasis, intertrigo 08/26/2023   Seborrheic keratoses 08/26/2023   Greater trochanteric pain syndrome 05/30/2023   Acute cystitis without hematuria 05/30/2023   Encounter for colorectal cancer screening 03/22/2023   Nausea without vomiting 03/17/2023   Flatulence 03/17/2023   S/P hysterectomy 10/06/2022   Urinary frequency 10/06/2022   Vaginal burning 10/06/2022   Vaginal irritation 10/06/2022   Vulvar irritation 10/06/2022   Sebaceous cyst of labia 10/06/2022   BPPV (benign paroxysmal positional vertigo), left 01/29/2022   Laryngopharyngeal reflux (LPR) 01/29/2022   Pharyngoesophageal dysphagia 01/29/2022   HLD (hyperlipidemia)     PCP: Duanne Butler DASEN, MD  REFERRING PROVIDER: Genelle Standing, MD  REFERRING DIAG: [phone] (ICD-10-CM) - Pain in left hip 1. Left hip gluteus maximus tendon transfer 2. Left hip trochanteric bursectomy; DOS 01/24/24  THERAPY DIAG:  Stiffness of left hip, not elsewhere classified  Pain in left hip  Other abnormalities of gait and mobility  Muscle weakness (generalized)  Rationale for Evaluation and Treatment: Rehabilitation  ONSET DATE: DOS 01/24/24  SUBJECTIVE:   SUBJECTIVE STATEMENT: Pt reports some soreness after last session. Feels injection for ITB has been helping. Reports improvement in ankle swelling. Compliant with HEP.  PERTINENT HISTORY: HLD, osteoporosis PAIN:  Are you having pain? Yes: NPRS scale: 2/10 Pain location: L hip Pain description: sore Aggravating factors:  walking Relieving factors: rest  PRECAUTIONS: Other: Left hip gluteus maximus tendon transfer  WEIGHT BEARING RESTRICTIONS: WBAT  FALLS:  Has patient fallen in last 6 months? No  PLOF: Independent  PATIENT GOALS: be able to function normal again  OBJECTIVE: (objective measures from initial evaluation unless otherwise dated)  PATIENT SURVEYS:  LEFS  Extreme difficulty/unable (0), Quite a bit  of difficulty (1), Moderate difficulty (2), Little difficulty (3), No difficulty (4) Survey date:  01/26/24  Any of your usual work, housework or school activities 3  2. Usual hobbies, recreational or sporting activities 3  3. Getting into/out of the bath 0  4. Walking between rooms 4  5. Putting on socks/shoes 4  6. Squatting  4  7. Lifting an object, like a bag of groceries from the floor 4  8. Performing light activities around your home 3  9. Performing heavy activities around your home 3  10. Getting into/out of a car 4  11. Walking 2 blocks 3  12. Walking 1 mile 1  13. Going up/down 10 stairs (1 flight) 4  14. Standing for 1 hour 3  15.  sitting for 1 hour 4  16. Running on even ground 1  17. Running on uneven ground 1  18. Making sharp turns while running fast 1  19. Hopping  1  20. Rolling over in bed 3  Score total:  54/80     COGNITION: Overall cognitive status: Within functional limits for tasks assessed     SENSATION: WFL   POSTURE: rounded shoulders and forward head  PALPATION: Grossly tender lateral L hip  LOWER EXTREMITY ROM:  Passive ROM Right eval Left eval  Hip flexion  > 90  Hip extension    Hip abduction    Hip adduction    Hip internal rotation    Hip external rotation    Knee flexion    Knee extension    Ankle dorsiflexion    Ankle plantarflexion    Ankle inversion    Ankle eversion     (Blank rows = not tested) *= pain/symptoms  LOWER EXTREMITY MMT:  MMT Right eval Left eval  Hip flexion    Hip extension    Hip abduction    Hip adduction    Hip internal rotation    Hip external rotation    Knee flexion 5 5  Knee extension 5 4+  Ankle dorsiflexion    Ankle plantarflexion    Ankle inversion    Ankle eversion     (Blank rows = not tested) *= pain/symptoms   FUNCTIONAL TESTS:  NT Transfer: labored with UE support  GAIT: Distance walked: 100 feet Assistive device utilized: rollator Level of assistance: Modified  independence Comments: slightly flexed trunk with rollator, antalgic on L    TODAY'S TREATMENT:  DATE:   02/20/24 Bike 5 minutes for hip mobility PROM L hip within protocol limits Gait with SPC- x110ft. Cues for increased step length LAQ 3 x 10 Partial bridges 2x10 Hooklying ball squeeze 2x10 5 hold Standing HR 2x10 Seated marching 2x10 HEP update/review   02/15/24 Bike 5 minutes for hip mobility PROM L hip within protocol limits Gait with SPC- x144ft. Cues for increased step length LAQ 3 x 10 Partial bridges 2x10 Hooklying ball squeeze 2x10 5 hold Standing HR 2x10 Seated marching 2x10 (trialled standing, but too painful) Discussion of precautions/restrictions, activity modifications.     02/09/24 Bike 5 minutes for hip mobility Gait mechanics with SPC and cueing for glute activation LAQ 3# 3 x 10 Supine hamstring isometrics with green ball 10 x 5 second holds Supine glute sets 2 x 10  02/03/24 Supine glute sets 2 x 10 Prone hamstring curl 3 x 10 Quadruped rocking 2 x 10 LAQ 3# 2 x 10 Gait mechanics with SPC Bike 5 minutes for hip mobility   01/26/24 Eval, HEP, education, rollator adjustment, gait mechanics   PATIENT EDUCATION:  Education details: Patient educated on exam findings, POC, scope of PT, HEP, relevant anatomy and biomechanics, compression garments, gait mechanics, and protocol/precautions. 02/03/24: HEP Person educated: Patient Education method: Explanation, Demonstration, and Handouts Education comprehension: verbalized understanding, returned demonstration, verbal cues required, and tactile cues required  HOME EXERCISE PROGRAM: Access Code: [email or web address] URL: [email or web address] Date: 01/26/2024 Prepared by: Prentice Zaunegger  Exercises - Gluteal Sets (Mirrored)  - 3 x daily - 7 x weekly - 2 sets - 10 reps - 5-10  second hold - Supine Quadricep Sets (Mirrored)  - 3 x daily - 7 x weekly - 2 sets - 10 reps - 5-10 second hold - Supine Ankle Pumps  - 5 x daily - 7 x weekly - 20 reps - Hooklying Gluteal Sets  - 3 x daily - 7 x weekly - 10 reps - 5-10 second hold - Supine Hip Adduction Isometric with Ball  - 3 x daily - 7 x weekly - 10 reps - 5-10 second hold  ASSESSMENT:  CLINICAL IMPRESSION: Moderate guarding present with PROM, so decreased range performed to avoid increasing pain levels. Pt continues to require cuing with gait for heel contact, toe off, and step length. She was advised to continue to use [facility] for longer distances until gait quality improves. Advised against treadmill use at this time. Reviewed HEP and adequate rest. Pt will be 4 weeks at next PT session. Will plan to progress exercises if tolerated.    OBJECTIVE IMPAIRMENTS: Abnormal gait, decreased activity tolerance, decreased balance, decreased endurance, decreased mobility, difficulty walking, decreased ROM, decreased strength, increased muscle spasms, impaired flexibility, improper body mechanics, and pain  ACTIVITY LIMITATIONS: lifting, bending, sitting, standing, squatting, sleeping, stairs, transfers, locomotion level, and caring for others  PARTICIPATION LIMITATIONS: meal prep, cleaning, laundry, shopping, community activity, occupation, and yard work  PERSONAL FACTORS: Time since onset of injury/illness/exacerbation and 1-2 comorbidities: HLD, osteoporosis are also affecting patient's functional outcome.   REHAB POTENTIAL: Good  CLINICAL DECISION MAKING: Evolving/moderate complexity  EVALUATION COMPLEXITY: Moderate   GOALS: Goals reviewed with patient? Yes  SHORT TERM GOALS: Target date: 03/08/2024    Patient will be independent with HEP in order to improve functional outcomes. Baseline: Goal status: INITIAL  2.  Patient will report at least 25% improvement in symptoms for improved quality of life. Baseline: Goal  status: INITIAL  3.  Pain free flexion to 90 Baseline:  Goal status: INITIAL  4.  SLS 30s without compensation Baseline:  Goal status: INITIAL   LONG TERM GOALS: Target date: 04/19/2024    Patient will report at least 75% improvement in symptoms for improved quality of life. Baseline:  Goal status: INITIAL  2.  Patient will improve LEFS score by at least 15 points in order to indicate improved tolerance to activity. Baseline:  Goal status: INITIAL  3.  Patient will be able to navigate stairs with reciprocal pattern without compensation in order to demonstrate improved LE strength. Baseline:  Goal status: INITIAL  4.  Patient will demonstrate MMT 80% of opposite lower extremity in all tested musculature as evidence of improved strength to assist with stair ambulation and gait. Baseline:  Goal status: INITIAL  5.  Patient will be able to return to all activities unrestricted for improved ability to perform work functions and participate with family.  Baseline:  Goal status: INITIAL    PLAN:  PT FREQUENCY: 1-2x/week  PT DURATION: 12 weeks  PLANNED INTERVENTIONS: [id number]- PT Re-evaluation, 97110-Therapeutic exercises, 97530- Therapeutic activity, [id number]- Neuromuscular re-education, 97535- Self Care, [id number]- Manual therapy, [id number]- Gait training, [phone]- Orthotic Fit/training, [phone]- Canalith repositioning, [id number]- Aquatic Therapy, [phone]- Splinting, [phone]- Wound care (first 20 sq cm), 97598- Wound care (each additional 20 sq cm)Patient/Family education, Balance training, Stair training, Taping, Dry Needling, Joint mobilization, Joint manipulation, Spinal manipulation, Spinal mobilization, Scar mobilization, and DME instructions.  PLAN FOR NEXT SESSION: progress with glute tendon transfer protocol   Asberry BRAVO Sylver Vantassell, PTA 02/20/2024, 2:30 PM

## 2024-02-20 NOTE — Progress Notes (Signed)
 Subjective:    Patient ID: Taylor Singleton, female    DOB: 1955-11-03, 68 y.o.   MRN: [id number] Patient was seen September 4 and diagnosed with a UTI.  She is here today because she does not feel well.  Urinalysis shows negative nitrates, nice negative leukocyte esterase and only trace blood.  She denies any dysuria or frequency.  She still has some dry mouth.  However she has a very difficult time putting into words her symptoms.  She states she does not feel right.  She feels like something is wrong.  She feels anxious.  She denies any chest pain or shortness of breath.  She does have some palpitations.  She denies any nausea or vomiting.  She denies any dysuria.  She denies any cough or pleurisy.  She denies any hemoptysis.  She denies any melena or hematochezia.  However she just feels like something is wrong.  She feels tired.  She feels off. Past Medical History:  Diagnosis Date   Anxiety    Arthritis    Chronic idiopathic constipation    GERD (gastroesophageal reflux disease)    HLD (hyperlipidemia)    Hyperlipidemia    Osteoporosis    Prolapse of posterior vaginal wall    Sciatica    Vaginal vault 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    Varicose vein of leg    Wears glasses    Past Surgical History:  Procedure Laterality Date   ANTERIOR AND POSTERIOR REPAIR WITH SACROSPINOUS FIXATION N/A 07/19/2022   Procedure: POSTERIOR REPAIR WITHP PERINEORRHAPHY AND  SACROSPINOUS FIXATION;  Surgeon: Marilynne Rosaline SAILOR, MD;  Location: [facility];  Service: Gynecology;  Laterality: N/A;  Total time requested is 1.5hrs   BREAST CYST EXCISION Left    CHOLECYSTECTOMY     CHOLECYSTECTOMY, LAPAROSCOPIC  2010   COLONOSCOPY WITH PROPOFOL  N/A 03/22/2023   Procedure: COLONOSCOPY WITH PROPOFOL ;  Surgeon: Eartha Angelia Sieving, MD;  Location: [facility];  Service: Gastroenterology;  Laterality: N/A;  8:45AM;ASA 1   GLUTEUS MINIMUS REPAIR Left 01/24/2024   Procedure: REPAIR, TENDON, GLUTEUS  MINIMUS;  Surgeon: Genelle Standing, MD;  Location: [facility];  Service: Orthopedics;  Laterality: Left;  LEFT GLUTEUS MAXIMUS TENDON TRANSFER   TOTAL ABDOMINAL HYSTERECTOMY W/ BILATERAL SALPINGOOPHORECTOMY     1990s   Current Outpatient Medications on File Prior to Visit  Medication Sig Dispense Refill   atorvastatin  (LIPITOR) 40 MG tablet Take 1 tablet (40 mg total) by mouth daily. 90 tablet 2   Calcium  Carb-Cholecalciferol (CALCIUM /VITAMIN D) 600-400 MG-UNIT TABS Take 2 tablets by mouth daily.     pantoprazole  (PROTONIX ) 40 MG tablet Take 1 tablet (40 mg total) by mouth daily. 90 tablet 2   psyllium (REGULOID) 0.52 g capsule Take 0.52 g by mouth daily.     sertraline  (ZOLOFT ) 50 MG tablet Take 1 tablet (50 mg total) by mouth daily. 30 tablet 3   sulfamethoxazole -trimethoprim  (BACTRIM  DS) 800-160 MG tablet Take 1 tablet by mouth 2 (two) times daily. 14 tablet 0   aspirin  EC 325 MG tablet Take 1 tablet (325 mg total) by mouth daily. (Patient not taking: Reported on 02/16/2024) 14 tablet 0   clonazePAM  (KLONOPIN ) 0.5 MG tablet Take 1 tablet (0.5 mg total) by mouth 2 (two) times daily as needed for anxiety. (Patient not taking: Reported on 02/20/2024) 20 tablet 1   oxybutynin  (DITROPAN  XL) 5 MG 24 hr tablet Take 1 tablet (5 mg total) by mouth at bedtime. (Patient not taking: Reported  on 02/20/2024) 30 tablet 11   oxyCODONE  (ROXICODONE ) 5 MG immediate release tablet Take 1 tablet (5 mg total) by mouth every 4 (four) hours as needed for severe pain (pain score 7-10) or breakthrough pain. (Patient not taking: Reported on 02/20/2024) 30 tablet 0   No current facility-administered medications on file prior to visit.   No Known Allergies Social History   Socioeconomic History   Marital status: Married    Spouse name: Not on file   Number of children: 2   Years of education: Not on file   Highest education level: Not on file  Occupational History   Not on file  Tobacco Use   Smoking  status: Never   Smokeless tobacco: Never  Vaping Use   Vaping status: Never Used  Substance and Sexual Activity   Alcohol use: No   Drug use: Never   Sexual activity: Not Currently    Birth control/protection: Surgical    Comment: hyst  Other Topics Concern   Not on file  Social History Narrative   Not on file   Social Drivers of Health   Financial Resource Strain: Low Risk  (11/24/2023)   Overall Financial Resource Strain (CARDIA)    Difficulty of Paying Living Expenses: Not hard at all  Food Insecurity: No Food Insecurity (11/24/2023)   Hunger Vital Sign    Worried About Running Out of Food in the Last Year: Never true    Ran Out of Food in the Last Year: Never true  Transportation Needs: No Transportation Needs (11/24/2023)   PRAPARE - Administrator, Civil Service (Medical): No    Lack of Transportation (Non-Medical): No  Physical Activity: Inactive (11/24/2023)   Exercise Vital Sign    Days of Exercise per Week: 0 days    Minutes of Exercise per Session: 0 min  Stress: No Stress Concern Present (11/24/2023)   [employer] of Occupational Health - Occupational Stress Questionnaire    Feeling of Stress: Not at all  Social Connections: Moderately Isolated (11/24/2023)   Social Connection and Isolation Panel    Frequency of Communication with Friends and Family: Three times a week    Frequency of Social Gatherings with Friends and Family: More than three times a week    Attends Religious Services: Never    Database administrator or Organizations: No    Attends Banker Meetings: Never    Marital Status: Married  Catering manager Violence: Not At Risk (11/24/2023)   Humiliation, Afraid, Rape, and Kick questionnaire    Fear of Current or Ex-Partner: No    Emotionally Abused: No    Physically Abused: No    Sexually Abused: No     Review of Systems  All other systems reviewed and are negative.      Objective:   Physical Exam Constitutional:       General: She is not in acute distress.    Appearance: Normal appearance. She is well-developed and normal weight. She is not ill-appearing or toxic-appearing.  Cardiovascular:     Rate and Rhythm: Normal rate and regular rhythm.     Heart sounds: Normal heart sounds.  Pulmonary:     Effort: Pulmonary effort is normal. No respiratory distress.     Breath sounds: No wheezing or rales.  Musculoskeletal:     Cervical back: Neck supple.  Lymphadenopathy:     Cervical: No cervical adenopathy.  Skin:    Findings: No rash.  Neurological:  Mental Status: She is alert and oriented to person, place, and time.     Cranial Nerves: No dysarthria or facial asymmetry.     Motor: No tremor, atrophy, abnormal muscle tone or seizure activity.     Coordination: Romberg sign negative. Finger-Nose-Finger Test normal.  Psychiatric:        Mood and Affect: Mood normal.        Behavior: Behavior normal.        Thought Content: Thought content normal.        Judgment: Judgment normal.           Assessment & Plan:  Acute cystitis without hematuria - Plan: Urinalysis, Routine w reflex microscopic  Anxiety  Chills - Plan: CBC with Differential/Platelet, Comprehensive metabolic panel with GFR, TSH, Sedimentation rate, B. burgdorfi antibodies by WB  Urinalysis shows that the urinary tract infection is resolving.  The only symptom the patient reports is chills.  However she is not having fevers.  Her temperature is typically 98 3-98 7.  I will check a CBC, CMP, sed rate, Lyme titers, and a TSH.  If labs are normal, I feel that the patient is dealing with generalized anxiety disorder.  I would recommend increasing her Zoloft  to 100 mg a day and adding Klonopin  on a as needed basis DC oxybutynin  due to xerostomia.  Start bactrim  ds bid for 7 days for UTI.  Recheck in 1 week.  Consider myrbetriq  for OAB if needed.

## 2024-02-21 ENCOUNTER — Telehealth: Payer: Self-pay

## 2024-02-21 ENCOUNTER — Ambulatory Visit: Payer: Self-pay | Admitting: Family Medicine

## 2024-02-21 NOTE — Telephone Encounter (Signed)
 Encounter made in error.

## 2024-02-22 LAB — COMPREHENSIVE METABOLIC PANEL WITH GFR
AG Ratio: 2 (calc) (ref 1.0–2.5)
ALT: 14 U/L (ref 6–29)
AST: 17 U/L (ref 10–35)
Albumin: 4.4 g/dL (ref 3.6–5.1)
Alkaline phosphatase (APISO): 78 U/L (ref 37–153)
BUN: 15 mg/dL (ref 7–25)
CO2: 24 mmol/L (ref 20–32)
Calcium: 9.6 mg/dL (ref 8.6–10.4)
Chloride: 100 mmol/L (ref 98–110)
Creat: 0.71 mg/dL (ref 0.60–1.00)
Globulin: 2.2 g/dL (ref 1.9–3.7)
Glucose, Bld: 95 mg/dL (ref 65–99)
Potassium: 4.3 mmol/L (ref 3.5–5.3)
Sodium: 134 mmol/L — ABNORMAL LOW (ref 135–146)
Total Bilirubin: 0.7 mg/dL (ref 0.2–1.2)
Total Protein: 6.6 g/dL (ref 6.1–8.1)
eGFR: 91 mL/min/1.73m2 (ref >=60)

## 2024-02-22 LAB — CBC WITH DIFFERENTIAL/PLATELET
Absolute Lymphocytes: 1288 {cells}/uL (ref 850–3900)
Absolute Monocytes: 629 {cells}/uL (ref 200–950)
Basophils Absolute: 89 {cells}/uL (ref 0–200)
Basophils Relative: 1.2 %
Eosinophils Absolute: 67 {cells}/uL (ref 15–500)
Eosinophils Relative: 0.9 %
HCT: 41.8 % (ref 35.0–45.0)
Hemoglobin: 13.7 g/dL (ref 11.7–15.5)
MCH: 30.1 pg (ref 27.0–33.0)
MCHC: 32.8 g/dL (ref 32.0–36.0)
MCV: 91.9 fL (ref 80.0–100.0)
MPV: 10.6 fL (ref 7.5–12.5)
Monocytes Relative: 8.5 %
Neutro Abs: 5328 {cells}/uL (ref 1500–7800)
Neutrophils Relative %: 72 %
Platelets: 382 Thousand/uL (ref 140–400)
RBC: 4.55 Million/uL (ref 3.80–5.10)
RDW: 12.1 % (ref 11.0–15.0)
Total Lymphocyte: 17.4 %
WBC: 7.4 Thousand/uL (ref 3.8–10.8)

## 2024-02-22 LAB — B. BURGDORFI ANTIBODIES BY WB

## 2024-02-22 LAB — TSH: TSH: 0.44 m[IU]/L (ref 0.40–4.50)

## 2024-02-22 LAB — SEDIMENTATION RATE: Sed Rate: 6 mm/h (ref 0–30)

## 2024-02-23 ENCOUNTER — Ambulatory Visit (HOSPITAL_BASED_OUTPATIENT_CLINIC_OR_DEPARTMENT_OTHER): Admitting: Physical Therapy

## 2024-02-23 ENCOUNTER — Encounter (HOSPITAL_BASED_OUTPATIENT_CLINIC_OR_DEPARTMENT_OTHER): Payer: Self-pay | Admitting: Physical Therapy

## 2024-02-23 DIAGNOSIS — M6281 Muscle weakness (generalized): Secondary | ICD-10-CM

## 2024-02-23 DIAGNOSIS — R2689 Other abnormalities of gait and mobility: Secondary | ICD-10-CM | POA: Diagnosis not present

## 2024-02-23 DIAGNOSIS — M25652 Stiffness of left hip, not elsewhere classified: Secondary | ICD-10-CM

## 2024-02-23 DIAGNOSIS — M25552 Pain in left hip: Secondary | ICD-10-CM

## 2024-02-23 NOTE — Therapy (Signed)
 OUTPATIENT PHYSICAL THERAPY TREATMENT   Patient Name: Taylor Singleton MRN: [id number] DOB:1956-03-26, 68 y.o., female Today's Date: 02/23/2024  END OF SESSION:  PT End of Session - 02/23/24 1150     Visit Number 6    Number of Visits 24    Date for PT Re-Evaluation 04/19/24    Authorization Type BCBS MEDICARE    Progress Note Due on Visit 10    PT Start Time 1149    PT Stop Time 1228    PT Time Calculation (min) 39 min    Activity Tolerance Patient tolerated treatment well    Behavior During Therapy WFL for tasks assessed/performed            Past Medical History:  Diagnosis Date   Anxiety    Arthritis    Chronic idiopathic constipation    GERD (gastroesophageal reflux disease)    HLD (hyperlipidemia)    Hyperlipidemia    Osteoporosis    Prolapse of posterior vaginal wall    Sciatica    Vaginal vault 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    Varicose vein of leg    Wears glasses    Past Surgical History:  Procedure Laterality Date   ANTERIOR AND POSTERIOR REPAIR WITH SACROSPINOUS FIXATION N/A 07/19/2022   Procedure: POSTERIOR REPAIR WITHP PERINEORRHAPHY AND  SACROSPINOUS FIXATION;  Surgeon: Marilynne Rosaline SAILOR, MD;  Location: [facility];  Service: Gynecology;  Laterality: N/A;  Total time requested is 1.5hrs   BREAST CYST EXCISION Left    CHOLECYSTECTOMY     CHOLECYSTECTOMY, LAPAROSCOPIC  2010   COLONOSCOPY WITH PROPOFOL  N/A 03/22/2023   Procedure: COLONOSCOPY WITH PROPOFOL ;  Surgeon: Eartha Angelia Sieving, MD;  Location: [facility];  Service: Gastroenterology;  Laterality: N/A;  8:45AM;ASA 1   GLUTEUS MINIMUS REPAIR Left 01/24/2024   Procedure: REPAIR, TENDON, GLUTEUS MINIMUS;  Surgeon: Genelle Standing, MD;  Location: [facility];  Service: Orthopedics;  Laterality: Left;  LEFT GLUTEUS MAXIMUS TENDON TRANSFER   TOTAL ABDOMINAL HYSTERECTOMY W/ BILATERAL SALPINGOOPHORECTOMY     1990s   Patient Active Problem List   Diagnosis Date Noted    Tear of left gluteus medius tendon 01/24/2024   Candidiasis, intertrigo 08/26/2023   Seborrheic keratoses 08/26/2023   Greater trochanteric pain syndrome 05/30/2023   Acute cystitis without hematuria 05/30/2023   Encounter for colorectal cancer screening 03/22/2023   Nausea without vomiting 03/17/2023   Flatulence 03/17/2023   S/P hysterectomy 10/06/2022   Urinary frequency 10/06/2022   Vaginal burning 10/06/2022   Vaginal irritation 10/06/2022   Vulvar irritation 10/06/2022   Sebaceous cyst of labia 10/06/2022   BPPV (benign paroxysmal positional vertigo), left 01/29/2022   Laryngopharyngeal reflux (LPR) 01/29/2022   Pharyngoesophageal dysphagia 01/29/2022   HLD (hyperlipidemia)     PCP: Duanne Butler DASEN, MD  REFERRING PROVIDER: Genelle Standing, MD  REFERRING DIAG: [id number] (ICD-10-CM) - Pain in left hip 1. Left hip gluteus maximus tendon transfer 2. Left hip trochanteric bursectomy; DOS 01/24/24  THERAPY DIAG:  Stiffness of left hip, not elsewhere classified  Pain in left hip  Other abnormalities of gait and mobility  Muscle weakness (generalized)  Rationale for Evaluation and Treatment: Rehabilitation  ONSET DATE: DOS 01/24/24  SUBJECTIVE:   SUBJECTIVE STATEMENT: Pt reports warn out feeling today. Hip doing alright.   PERTINENT HISTORY: HLD, osteoporosis PAIN:  Are you having pain? Yes: NPRS scale: 2/10 Pain location: L hip Pain description: sore Aggravating factors: walking Relieving factors: rest  PRECAUTIONS: Other: Left hip gluteus maximus tendon  transfer  WEIGHT BEARING RESTRICTIONS: WBAT  FALLS:  Has patient fallen in last 6 months? No  PLOF: Independent  PATIENT GOALS: be able to function normal again  OBJECTIVE: (objective measures from initial evaluation unless otherwise dated)  PATIENT SURVEYS:  LEFS  Extreme difficulty/unable (0), Quite a bit of difficulty (1), Moderate difficulty (2), Little difficulty (3), No difficulty  (4) Survey date:  01/26/24  Any of your usual work, housework or school activities 3  2. Usual hobbies, recreational or sporting activities 3  3. Getting into/out of the bath 0  4. Walking between rooms 4  5. Putting on socks/shoes 4  6. Squatting  4  7. Lifting an object, like a bag of groceries from the floor 4  8. Performing light activities around your home 3  9. Performing heavy activities around your home 3  10. Getting into/out of a car 4  11. Walking 2 blocks 3  12. Walking 1 mile 1  13. Going up/down 10 stairs (1 flight) 4  14. Standing for 1 hour 3  15.  sitting for 1 hour 4  16. Running on even ground 1  17. Running on uneven ground 1  18. Making sharp turns while running fast 1  19. Hopping  1  20. Rolling over in bed 3  Score total:  54/80     COGNITION: Overall cognitive status: Within functional limits for tasks assessed     SENSATION: WFL   POSTURE: rounded shoulders and forward head  PALPATION: Grossly tender lateral L hip  LOWER EXTREMITY ROM:  Passive ROM Right eval Left eval  Hip flexion  > 90  Hip extension    Hip abduction    Hip adduction    Hip internal rotation    Hip external rotation    Knee flexion    Knee extension    Ankle dorsiflexion    Ankle plantarflexion    Ankle inversion    Ankle eversion     (Blank rows = not tested) *= pain/symptoms  LOWER EXTREMITY MMT:  MMT Right eval Left eval  Hip flexion    Hip extension    Hip abduction    Hip adduction    Hip internal rotation    Hip external rotation    Knee flexion 5 5  Knee extension 5 4+  Ankle dorsiflexion    Ankle plantarflexion    Ankle inversion    Ankle eversion     (Blank rows = not tested) *= pain/symptoms   FUNCTIONAL TESTS:  NT Transfer: labored with UE support  GAIT: Distance walked: 100 feet Assistive device utilized: rollator Level of assistance: Modified independence Comments: slightly flexed trunk with rollator, antalgic on L     TODAY'S TREATMENT:                                                                                                                              DATE:  02/23/24 Bike 5 minutes  for hip mobility Bridge 2 x 10 Hooklying hip abduction isometric with belt 15 x 5-10 second holds Prone hip extension 2 x 10 Standing hip extension 2 x 10   02/20/24 Bike 5 minutes for hip mobility PROM L hip within protocol limits Gait with SPC- x130ft. Cues for increased step length LAQ 3 x 10 Partial bridges 2x10 Hooklying ball squeeze 2x10 5 hold Standing HR 2x10 Seated marching 2x10 HEP update/review   02/15/24 Bike 5 minutes for hip mobility PROM L hip within protocol limits Gait with SPC- x153ft. Cues for increased step length LAQ 3 x 10 Partial bridges 2x10 Hooklying ball squeeze 2x10 5 hold Standing HR 2x10 Seated marching 2x10 (trialled standing, but too painful) Discussion of precautions/restrictions, activity modifications.     02/09/24 Bike 5 minutes for hip mobility Gait mechanics with SPC and cueing for glute activation LAQ 3# 3 x 10 Supine hamstring isometrics with green ball 10 x 5 second holds Supine glute sets 2 x 10  02/03/24 Supine glute sets 2 x 10 Prone hamstring curl 3 x 10 Quadruped rocking 2 x 10 LAQ 3# 2 x 10 Gait mechanics with SPC Bike 5 minutes for hip mobility   01/26/24 Eval, HEP, education, rollator adjustment, gait mechanics   PATIENT EDUCATION:  Education details: Patient educated on exam findings, POC, scope of PT, HEP, relevant anatomy and biomechanics, compression garments, gait mechanics, and protocol/precautions. 02/03/24: HEP Person educated: Patient Education method: Explanation, Demonstration, and Handouts Education comprehension: verbalized understanding, returned demonstration, verbal cues required, and tactile cues required  HOME EXERCISE PROGRAM: Access Code: [email or web address] URL: [email or web address] Date: 01/26/2024 Prepared  by: Prentice Tenishia Ekman  Exercises - Gluteal Sets (Mirrored)  - 3 x daily - 7 x weekly - 2 sets - 10 reps - 5-10 second hold - Supine Quadricep Sets (Mirrored)  - 3 x daily - 7 x weekly - 2 sets - 10 reps - 5-10 second hold - Supine Ankle Pumps  - 5 x daily - 7 x weekly - 20 reps - Hooklying Gluteal Sets  - 3 x daily - 7 x weekly - 10 reps - 5-10 second hold - Supine Hip Adduction Isometric with Ball  - 3 x daily - 7 x weekly - 10 reps - 5-10 second hold  ASSESSMENT:  CLINICAL IMPRESSION: Continued to progress hip strengthening which is tolerated well. Some cueing required for increased ROM as able. Patient will continue to benefit from physical therapy in order to improve function and reduce impairment.    OBJECTIVE IMPAIRMENTS: Abnormal gait, decreased activity tolerance, decreased balance, decreased endurance, decreased mobility, difficulty walking, decreased ROM, decreased strength, increased muscle spasms, impaired flexibility, improper body mechanics, and pain  ACTIVITY LIMITATIONS: lifting, bending, sitting, standing, squatting, sleeping, stairs, transfers, locomotion level, and caring for others  PARTICIPATION LIMITATIONS: meal prep, cleaning, laundry, shopping, community activity, occupation, and yard work  PERSONAL FACTORS: Time since onset of injury/illness/exacerbation and 1-2 comorbidities: HLD, osteoporosis are also affecting patient's functional outcome.   REHAB POTENTIAL: Good  CLINICAL DECISION MAKING: Evolving/moderate complexity  EVALUATION COMPLEXITY: Moderate   GOALS: Goals reviewed with patient? Yes  SHORT TERM GOALS: Target date: 03/08/2024    Patient will be independent with HEP in order to improve functional outcomes. Baseline: Goal status: INITIAL  2.  Patient will report at least 25% improvement in symptoms for improved quality of life. Baseline: Goal status: INITIAL  3.  Pain free flexion to 90 Baseline:  Goal status: INITIAL  4.  SLS 30s  without compensation  Baseline:  Goal status: INITIAL   LONG TERM GOALS: Target date: 04/19/2024    Patient will report at least 75% improvement in symptoms for improved quality of life. Baseline:  Goal status: INITIAL  2.  Patient will improve LEFS score by at least 15 points in order to indicate improved tolerance to activity. Baseline:  Goal status: INITIAL  3.  Patient will be able to navigate stairs with reciprocal pattern without compensation in order to demonstrate improved LE strength. Baseline:  Goal status: INITIAL  4.  Patient will demonstrate MMT 80% of opposite lower extremity in all tested musculature as evidence of improved strength to assist with stair ambulation and gait. Baseline:  Goal status: INITIAL  5.  Patient will be able to return to all activities unrestricted for improved ability to perform work functions and participate with family.  Baseline:  Goal status: INITIAL    PLAN:  PT FREQUENCY: 1-2x/week  PT DURATION: 12 weeks  PLANNED INTERVENTIONS: [id number]- PT Re-evaluation, 97110-Therapeutic exercises, 97530- Therapeutic activity, [id number]- Neuromuscular re-education, 97535- Self Care, [id number]- Manual therapy, [id number]- Gait training, [phone]- Orthotic Fit/training, [phone]- Canalith repositioning, [id number]- Aquatic Therapy, [phone]- Splinting, [phone]- Wound care (first 20 sq cm), 97598- Wound care (each additional 20 sq cm)Patient/Family education, Balance training, Stair training, Taping, Dry Needling, Joint mobilization, Joint manipulation, Spinal manipulation, Spinal mobilization, Scar mobilization, and DME instructions.  PLAN FOR NEXT SESSION: progress with glute tendon transfer protocol   Prentice RAMAN Kolt Mcwhirter, PT 02/23/2024, 12:31 PM

## 2024-02-27 ENCOUNTER — Encounter (HOSPITAL_BASED_OUTPATIENT_CLINIC_OR_DEPARTMENT_OTHER): Payer: Self-pay

## 2024-02-27 ENCOUNTER — Ambulatory Visit (HOSPITAL_BASED_OUTPATIENT_CLINIC_OR_DEPARTMENT_OTHER)

## 2024-02-27 DIAGNOSIS — M6281 Muscle weakness (generalized): Secondary | ICD-10-CM

## 2024-02-27 DIAGNOSIS — R2689 Other abnormalities of gait and mobility: Secondary | ICD-10-CM

## 2024-02-27 DIAGNOSIS — M25652 Stiffness of left hip, not elsewhere classified: Secondary | ICD-10-CM | POA: Diagnosis not present

## 2024-02-27 DIAGNOSIS — M25552 Pain in left hip: Secondary | ICD-10-CM

## 2024-02-27 NOTE — Therapy (Signed)
 OUTPATIENT PHYSICAL THERAPY TREATMENT   Patient Name: Taylor Singleton MRN: [id number] DOB:1955-08-21, 68 y.o., female Today's Date: 02/27/2024  END OF SESSION:  PT End of Session - 02/27/24 1142     Visit Number 7    Number of Visits 24    Date for PT Re-Evaluation 04/19/24    Authorization Type BCBS MEDICARE    Progress Note Due on Visit 10    PT Start Time 1145    PT Stop Time 1227    PT Time Calculation (min) 42 min    Activity Tolerance Patient tolerated treatment well    Behavior During Therapy WFL for tasks assessed/performed             Past Medical History:  Diagnosis Date   Anxiety    Arthritis    Chronic idiopathic constipation    GERD (gastroesophageal reflux disease)    HLD (hyperlipidemia)    Hyperlipidemia    Osteoporosis    Prolapse of posterior vaginal wall    Sciatica    Vaginal vault 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    Varicose vein of leg    Wears glasses    Past Surgical History:  Procedure Laterality Date   ANTERIOR AND POSTERIOR REPAIR WITH SACROSPINOUS FIXATION N/A 07/19/2022   Procedure: POSTERIOR REPAIR WITHP PERINEORRHAPHY AND  SACROSPINOUS FIXATION;  Surgeon: Marilynne Rosaline SAILOR, MD;  Location: [facility];  Service: Gynecology;  Laterality: N/A;  Total time requested is 1.5hrs   BREAST CYST EXCISION Left    CHOLECYSTECTOMY     CHOLECYSTECTOMY, LAPAROSCOPIC  2010   COLONOSCOPY WITH PROPOFOL  N/A 03/22/2023   Procedure: COLONOSCOPY WITH PROPOFOL ;  Surgeon: Eartha Angelia Sieving, MD;  [facility];  Service: Gastroenterology;  Laterality: N/A;  8:45AM;ASA 1   GLUTEUS MINIMUS REPAIR Left 01/24/2024   Procedure: REPAIR, TENDON, GLUTEUS MINIMUS;  Surgeon: Genelle Standing, MD;  Location: [facility];  Service: Orthopedics;  Laterality: Left;  LEFT GLUTEUS MAXIMUS TENDON TRANSFER   TOTAL ABDOMINAL HYSTERECTOMY W/ BILATERAL SALPINGOOPHORECTOMY     1990s   Patient Active Problem List   Diagnosis Date  Noted   Tear of left gluteus medius tendon 01/24/2024   Candidiasis, intertrigo 08/26/2023   Seborrheic keratoses 08/26/2023   Greater trochanteric pain syndrome 05/30/2023   Acute cystitis without hematuria 05/30/2023   Encounter for colorectal cancer screening 03/22/2023   Nausea without vomiting 03/17/2023   Flatulence 03/17/2023   S/P hysterectomy 10/06/2022   Urinary frequency 10/06/2022   Vaginal burning 10/06/2022   Vaginal irritation 10/06/2022   Vulvar irritation 10/06/2022   Sebaceous cyst of labia 10/06/2022   BPPV (benign paroxysmal positional vertigo), left 01/29/2022   Laryngopharyngeal reflux (LPR) 01/29/2022   Pharyngoesophageal dysphagia 01/29/2022   HLD (hyperlipidemia)     PCP: Duanne Butler DASEN, MD  REFERRING PROVIDER: Genelle Standing, MD  REFERRING DIAG: [phone] (ICD-10-CM) - Pain in left hip 1. Left hip gluteus maximus tendon transfer 2. Left hip trochanteric bursectomy; DOS 01/24/24  THERAPY DIAG:  Stiffness of left hip, not elsewhere classified  Pain in left hip  Other abnormalities of gait and mobility  Muscle weakness (generalized)  Rationale for Evaluation and Treatment: Rehabilitation  ONSET DATE: DOS 01/24/24  SUBJECTIVE:   SUBJECTIVE STATEMENT: Pt reports her hip feels tight, especially in the morning. Mild pain over incision area.   PERTINENT HISTORY: HLD, osteoporosis PAIN:  Are you having pain? Yes: NPRS scale: 2/10 Pain location: L hip Pain description: sore Aggravating factors: walking Relieving factors: rest  PRECAUTIONS: Other: Left hip gluteus maximus tendon transfer  WEIGHT BEARING RESTRICTIONS: WBAT  FALLS:  Has patient fallen in last 6 months? No  PLOF: Independent  PATIENT GOALS: be able to function normal again  OBJECTIVE: (objective measures from initial evaluation unless otherwise dated)  PATIENT SURVEYS:  LEFS  Extreme difficulty/unable (0), Quite a bit of difficulty (1), Moderate difficulty (2), Little  difficulty (3), No difficulty (4) Survey date:  01/26/24  Any of your usual work, housework or school activities 3  2. Usual hobbies, recreational or sporting activities 3  3. Getting into/out of the bath 0  4. Walking between rooms 4  5. Putting on socks/shoes 4  6. Squatting  4  7. Lifting an object, like a bag of groceries from the floor 4  8. Performing light activities around your home 3  9. Performing heavy activities around your home 3  10. Getting into/out of a car 4  11. Walking 2 blocks 3  12. Walking 1 mile 1  13. Going up/down 10 stairs (1 flight) 4  14. Standing for 1 hour 3  15.  sitting for 1 hour 4  16. Running on even ground 1  17. Running on uneven ground 1  18. Making sharp turns while running fast 1  19. Hopping  1  20. Rolling over in bed 3  Score total:  54/80     COGNITION: Overall cognitive status: Within functional limits for tasks assessed     SENSATION: WFL   POSTURE: rounded shoulders and forward head  PALPATION: Grossly tender lateral L hip  LOWER EXTREMITY ROM:  Passive ROM Right eval Left eval  Hip flexion  > 90  Hip extension    Hip abduction    Hip adduction    Hip internal rotation    Hip external rotation    Knee flexion    Knee extension    Ankle dorsiflexion    Ankle plantarflexion    Ankle inversion    Ankle eversion     (Blank rows = not tested) *= pain/symptoms  LOWER EXTREMITY MMT:  MMT Right eval Left eval  Hip flexion    Hip extension    Hip abduction    Hip adduction    Hip internal rotation    Hip external rotation    Knee flexion 5 5  Knee extension 5 4+  Ankle dorsiflexion    Ankle plantarflexion    Ankle inversion    Ankle eversion     (Blank rows = not tested) *= pain/symptoms   FUNCTIONAL TESTS:  NT Transfer: labored with UE support  GAIT: Distance walked: 100 feet Assistive device utilized: rollator Level of assistance: Modified independence Comments: slightly flexed trunk with  rollator, antalgic on L    TODAY'S TREATMENT:  DATE:   02/27/24 Bike 5 minutes for hip mobility L2 PROM L hip within protocol limits Gait with SPC- x132ft. Cues for increased step length Hooklying hip abduction isometric with belt 15 x 5-10 second holds LAQ 2# 5 2x10 Partial bridges 2x10 Hooklying ball squeeze 2x10 5 hold Standing HR 2x10 Standing hip extension 2x10  02/23/24 Bike 5 minutes for hip mobility Bridge 2 x 10 Hooklying hip abduction isometric with belt 15 x 5-10 second holds Prone hip extension 2 x 10 Standing hip extension 2 x 10   02/20/24 Bike 5 minutes for hip mobility PROM L hip within protocol limits Gait with SPC- x174ft. Cues for increased step length LAQ 3 x 10 Partial bridges 2x10 Hooklying ball squeeze 2x10 5 hold Standing HR 2x10 Seated marching 2x10 HEP update/review   02/15/24 Bike 5 minutes for hip mobility PROM L hip within protocol limits Gait with SPC- x115ft. Cues for increased step length LAQ 3 x 10 Partial bridges 2x10 Hooklying ball squeeze 2x10 5 hold Standing HR 2x10 Seated marching 2x10 (trialled standing, but too painful) Discussion of precautions/restrictions, activity modifications.      PATIENT EDUCATION:  Education details: Patient educated on exam findings, POC, scope of PT, HEP, relevant anatomy and biomechanics, compression garments, gait mechanics, and protocol/precautions. 02/03/24: HEP Person educated: Patient Education method: Explanation, Demonstration, and Handouts Education comprehension: verbalized understanding, returned demonstration, verbal cues required, and tactile cues required  HOME EXERCISE PROGRAM: Access Code: [email or web address] URL: [email or web address] Date: 01/26/2024 Prepared by: Prentice Zaunegger  Exercises - Gluteal Sets (Mirrored)  - 3 x daily - 7 x weekly - 2  sets - 10 reps - 5-10 second hold - Supine Quadricep Sets (Mirrored)  - 3 x daily - 7 x weekly - 2 sets - 10 reps - 5-10 second hold - Supine Ankle Pumps  - 5 x daily - 7 x weekly - 20 reps - Hooklying Gluteal Sets  - 3 x daily - 7 x weekly - 10 reps - 5-10 second hold - Supine Hip Adduction Isometric with Ball  - 3 x daily - 7 x weekly - 10 reps - 5-10 second hold  ASSESSMENT:  CLINICAL IMPRESSION: Pt progressing well per protocol. She denies increase in discomfort with exercises. Improving with gait, but does revert to shuffling pattern when distracted. Reviewed isometric hip abduction and standing hip extension as pt questioned her form with these. Will continue to progress as tolerated.     OBJECTIVE IMPAIRMENTS: Abnormal gait, decreased activity tolerance, decreased balance, decreased endurance, decreased mobility, difficulty walking, decreased ROM, decreased strength, increased muscle spasms, impaired flexibility, improper body mechanics, and pain  ACTIVITY LIMITATIONS: lifting, bending, sitting, standing, squatting, sleeping, stairs, transfers, locomotion level, and caring for others  PARTICIPATION LIMITATIONS: meal prep, cleaning, laundry, shopping, community activity, occupation, and yard work  PERSONAL FACTORS: Time since onset of injury/illness/exacerbation and 1-2 comorbidities: HLD, osteoporosis are also affecting patient's functional outcome.   REHAB POTENTIAL: Good  CLINICAL DECISION MAKING: Evolving/moderate complexity  EVALUATION COMPLEXITY: Moderate   GOALS: Goals reviewed with patient? Yes  SHORT TERM GOALS: Target date: 03/08/2024    Patient will be independent with HEP in order to improve functional outcomes. Baseline: Goal status: INITIAL  2.  Patient will report at least 25% improvement in symptoms for improved quality of life. Baseline: Goal status: INITIAL  3.  Pain free flexion to 90 Baseline:  Goal status: INITIAL  4.  SLS 30s without  compensation Baseline:  Goal status: INITIAL   LONG TERM  GOALS: Target date: 04/19/2024    Patient will report at least 75% improvement in symptoms for improved quality of life. Baseline:  Goal status: INITIAL  2.  Patient will improve LEFS score by at least 15 points in order to indicate improved tolerance to activity. Baseline:  Goal status: INITIAL  3.  Patient will be able to navigate stairs with reciprocal pattern without compensation in order to demonstrate improved LE strength. Baseline:  Goal status: INITIAL  4.  Patient will demonstrate MMT 80% of opposite lower extremity in all tested musculature as evidence of improved strength to assist with stair ambulation and gait. Baseline:  Goal status: INITIAL  5.  Patient will be able to return to all activities unrestricted for improved ability to perform work functions and participate with family.  Baseline:  Goal status: INITIAL    PLAN:  PT FREQUENCY: 1-2x/week  PT DURATION: 12 weeks  PLANNED INTERVENTIONS: 97164- PT Re-evaluation, 97110-Therapeutic exercises, 97530- Therapeutic activity, [id number]- Neuromuscular re-education, 97535- Self Care, [id number]- Manual therapy, [id number]- Gait training, [phone]- Orthotic Fit/training, [phone]- Canalith repositioning, [id number]- Aquatic Therapy, [phone]- Splinting, [phone]- Wound care (first 20 sq cm), 97598- Wound care (each additional 20 sq cm)Patient/Family education, Balance training, Stair training, Taping, Dry Needling, Joint mobilization, Joint manipulation, Spinal manipulation, Spinal mobilization, Scar mobilization, and DME instructions.  PLAN FOR NEXT SESSION: progress with glute tendon transfer protocol   Asberry BRAVO Jaysen Wey, PTA 02/27/2024, 1:27 PM

## 2024-03-01 ENCOUNTER — Encounter (HOSPITAL_BASED_OUTPATIENT_CLINIC_OR_DEPARTMENT_OTHER): Admitting: Physical Therapy

## 2024-03-06 ENCOUNTER — Encounter (HOSPITAL_BASED_OUTPATIENT_CLINIC_OR_DEPARTMENT_OTHER): Payer: Self-pay | Admitting: Physical Therapy

## 2024-03-06 ENCOUNTER — Ambulatory Visit (HOSPITAL_BASED_OUTPATIENT_CLINIC_OR_DEPARTMENT_OTHER): Admitting: Physical Therapy

## 2024-03-06 DIAGNOSIS — M25652 Stiffness of left hip, not elsewhere classified: Secondary | ICD-10-CM | POA: Diagnosis not present

## 2024-03-06 DIAGNOSIS — M6281 Muscle weakness (generalized): Secondary | ICD-10-CM | POA: Diagnosis not present

## 2024-03-06 DIAGNOSIS — M25552 Pain in left hip: Secondary | ICD-10-CM | POA: Diagnosis not present

## 2024-03-06 DIAGNOSIS — R2689 Other abnormalities of gait and mobility: Secondary | ICD-10-CM | POA: Diagnosis not present

## 2024-03-06 NOTE — Therapy (Signed)
 OUTPATIENT PHYSICAL THERAPY TREATMENT   Patient Name: Taylor Singleton MRN: [id number] DOB:[birth year], 68 y.o., female Today's Date: 03/06/2024  END OF SESSION:  PT End of Session - 03/06/24 1235     Visit Number 8    Number of Visits 24    Date for Recertification  04/19/24    Authorization Type BCBS MEDICARE    Progress Note Due on Visit 10    PT Start Time 1231    PT Stop Time 1310    PT Time Calculation (min) 39 min    Activity Tolerance Patient tolerated treatment well    Behavior During Therapy WFL for tasks assessed/performed             Past Medical History:  Diagnosis Date   Anxiety    Arthritis    Chronic idiopathic constipation    GERD (gastroesophageal reflux disease)    HLD (hyperlipidemia)    Hyperlipidemia    Osteoporosis    Prolapse of posterior vaginal wall    Sciatica    Vaginal vault 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    Varicose vein of leg    Wears glasses    Past Surgical History:  Procedure Laterality Date   ANTERIOR AND POSTERIOR REPAIR WITH SACROSPINOUS FIXATION N/A 07/19/2022   Procedure: POSTERIOR REPAIR WITHP PERINEORRHAPHY AND  SACROSPINOUS FIXATION;  Surgeon: Marilynne Rosaline SAILOR, MD;  Location: [facility];  Service: Gynecology;  Laterality: N/A;  Total time requested is 1.5hrs   BREAST CYST EXCISION Left    CHOLECYSTECTOMY     CHOLECYSTECTOMY, LAPAROSCOPIC  2010   COLONOSCOPY WITH PROPOFOL  N/A 03/22/2023   Procedure: COLONOSCOPY WITH PROPOFOL ;  Surgeon: Eartha Angelia Sieving, MD;  [facility]: [facility];  Service: Gastroenterology;  Laterality: N/A;  8:45AM;ASA 1   GLUTEUS MINIMUS REPAIR Left 01/24/2024   Procedure: REPAIR, TENDON, GLUTEUS MINIMUS;  Surgeon: Genelle Standing, MD;  Location: [facility];  Service: Orthopedics;  Laterality: Left;  LEFT GLUTEUS MAXIMUS TENDON TRANSFER   TOTAL ABDOMINAL HYSTERECTOMY W/ BILATERAL SALPINGOOPHORECTOMY     1990s   Patient Active Problem List   Diagnosis Date  Noted   Tear of left gluteus medius tendon 01/24/2024   Candidiasis, intertrigo 08/26/2023   Seborrheic keratoses 08/26/2023   Greater trochanteric pain syndrome 05/30/2023   Acute cystitis without hematuria 05/30/2023   Encounter for colorectal cancer screening 03/22/2023   Nausea without vomiting 03/17/2023   Flatulence 03/17/2023   S/P hysterectomy 10/06/2022   Urinary frequency 10/06/2022   Vaginal burning 10/06/2022   Vaginal irritation 10/06/2022   Vulvar irritation 10/06/2022   Sebaceous cyst of labia 10/06/2022   BPPV (benign paroxysmal positional vertigo), left 01/29/2022   Laryngopharyngeal reflux (LPR) 01/29/2022   Pharyngoesophageal dysphagia 01/29/2022   HLD (hyperlipidemia)     PCP: Duanne Butler DASEN, MD  REFERRING PROVIDER: Genelle Standing, MD  REFERRING DIAG: [phone] (ICD-10-CM) - Pain in left hip 1. Left hip gluteus maximus tendon transfer 2. Left hip trochanteric bursectomy; DOS 01/24/24  THERAPY DIAG:  Stiffness of left hip, not elsewhere classified  Pain in left hip  Other abnormalities of gait and mobility  Muscle weakness (generalized)  Rationale for Evaluation and Treatment: Rehabilitation  ONSET DATE: DOS 01/24/24  SUBJECTIVE:   SUBJECTIVE STATEMENT: Pt reports 70% improvement in symptoms since starting therapy overall.  She reports she had difficulty walking after last session. Needed to return to using walker.  She thinks it was the passive ROM into Abduction.   PERTINENT HISTORY: HLD, osteoporosis PAIN:  Are  you having pain? Yes: NPRS scale: 2/10 Pain location: L hip Pain description: sore Aggravating factors: walking Relieving factors: rest  PRECAUTIONS: Other: Left hip gluteus maximus tendon transfer  WEIGHT BEARING RESTRICTIONS: WBAT  FALLS:  Has patient fallen in last 6 months? No  PLOF: Independent  PATIENT GOALS: be able to function normal again  OBJECTIVE: (objective measures from initial evaluation unless otherwise  dated)  PATIENT SURVEYS:  LEFS  Extreme difficulty/unable (0), Quite a bit of difficulty (1), Moderate difficulty (2), Little difficulty (3), No difficulty (4) Survey date:  01/26/24  Any of your usual work, housework or school activities 3  2. Usual hobbies, recreational or sporting activities 3  3. Getting into/out of the bath 0  4. Walking between rooms 4  5. Putting on socks/shoes 4  6. Squatting  4  7. Lifting an object, like a bag of groceries from the floor 4  8. Performing light activities around your home 3  9. Performing heavy activities around your home 3  10. Getting into/out of a car 4  11. Walking 2 blocks 3  12. Walking 1 mile 1  13. Going up/down 10 stairs (1 flight) 4  14. Standing for 1 hour 3  15.  sitting for 1 hour 4  16. Running on even ground 1  17. Running on uneven ground 1  18. Making sharp turns while running fast 1  19. Hopping  1  20. Rolling over in bed 3  Score total:  54/80     COGNITION: Overall cognitive status: Within functional limits for tasks assessed     SENSATION: WFL   POSTURE: rounded shoulders and forward head  PALPATION: Grossly tender lateral L hip  LOWER EXTREMITY ROM:  Passive ROM Right eval Left eval  Hip flexion  > 90  Hip extension    Hip abduction    Hip adduction    Hip internal rotation    Hip external rotation    Knee flexion    Knee extension    Ankle dorsiflexion    Ankle plantarflexion    Ankle inversion    Ankle eversion     (Blank rows = not tested) *= pain/symptoms  LOWER EXTREMITY MMT:  MMT Right eval Left eval  Hip flexion    Hip extension    Hip abduction    Hip adduction    Hip internal rotation    Hip external rotation    Knee flexion 5 5  Knee extension 5 4+  Ankle dorsiflexion    Ankle plantarflexion    Ankle inversion    Ankle eversion     (Blank rows = not tested) *= pain/symptoms   FUNCTIONAL TESTS:  NT Transfer: labored with UE support  GAIT: Distance walked: 100  feet Assistive device utilized: rollator Level of assistance: Modified independence Comments: slightly flexed trunk with rollator, antalgic on L    TODAY'S TREATMENT:  DATE:  03/06/24 Scifit L1-2, 6 min LAQ 3# 5 2x10 Standing weight shifts on scales  Squats with light UE support Bil calf raises x 10 Standing high knee marching with UE on rails Side stepping R/L with UE on rails Trial of hip rotation with Lt lower leg on stool; limited UE on rails: Lt hip abdct to toe touch x 10; Lt hip extension to toe tap x 10 Prone Lt knee flexion with hip IR/ER, range to tolerance  02/27/24 Bike 5 minutes for hip mobility L2 PROM L hip within protocol limits Gait with SPC- x190ft. Cues for increased step length Hooklying hip abduction isometric with belt 15 x 5-10 second holds LAQ 2# 5 2x10 Partial bridges 2x10 Hooklying ball squeeze 2x10 5 hold Standing HR 2x10 Standing hip extension 2x10  02/23/24 Bike 5 minutes for hip mobility Bridge 2 x 10 Hooklying hip abduction isometric with belt 15 x 5-10 second holds Prone hip extension 2 x 10 Standing hip extension 2 x 10   02/20/24 Bike 5 minutes for hip mobility PROM L hip within protocol limits Gait with SPC- x175ft. Cues for increased step length LAQ 3 x 10 Partial bridges 2x10 Hooklying ball squeeze 2x10 5 hold Standing HR 2x10 Seated marching 2x10 HEP update/review   02/15/24 Bike 5 minutes for hip mobility PROM L hip within protocol limits Gait with SPC- x182ft. Cues for increased step length LAQ 3 x 10 Partial bridges 2x10 Hooklying ball squeeze 2x10 5 hold Standing HR 2x10 Seated marching 2x10 (trialled standing, but too painful) Discussion of precautions/restrictions, activity modifications.      PATIENT EDUCATION:  Education details:  Person educated: Patient Education method:  Solicitor, Education comprehension: verbalized understanding, returned demonstration, verbal cues required, and tactile cues required  HOME EXERCISE PROGRAM: Access Code: [email or web address] URL: [email or web address] Updated 03/06/24  ASSESSMENT:  CLINICAL IMPRESSION: Pt is 6 wks s/p Lt glute tendon transfer. She tolerated all progression of exercises well with slight reduction in pain in Lt hip. She was able to demonstrate equal wt between legs for mid-range squats without difficulty. She reported 70% reduction in symptoms since initiating therapy; has met STG2.  Plan to trial forward step ups next visit.  Will continue to progress as tolerated.     OBJECTIVE IMPAIRMENTS: Abnormal gait, decreased activity tolerance, decreased balance, decreased endurance, decreased mobility, difficulty walking, decreased ROM, decreased strength, increased muscle spasms, impaired flexibility, improper body mechanics, and pain  ACTIVITY LIMITATIONS: lifting, bending, sitting, standing, squatting, sleeping, stairs, transfers, locomotion level, and caring for others  PARTICIPATION LIMITATIONS: meal prep, cleaning, laundry, shopping, community activity, occupation, and yard work  PERSONAL FACTORS: Time since onset of injury/illness/exacerbation and 1-2 comorbidities: HLD, osteoporosis are also affecting patient's functional outcome.   REHAB POTENTIAL: Good  CLINICAL DECISION MAKING: Evolving/moderate complexity  EVALUATION COMPLEXITY: Moderate   GOALS: Goals reviewed with patient? Yes  SHORT TERM GOALS: Target date: 03/08/2024    Patient will be independent with HEP in order to improve functional outcomes. Baseline: Goal status: INITIAL  2.  Patient will report at least 25% improvement in symptoms for improved quality of life. Baseline: Goal status: MET - 03/06/24  3.  Pain free flexion to 90 Baseline:  Goal status:MET   4.  SLS 30s without compensation Baseline: (will  trial closer to 8wks per protocol) Goal status: INITIAL   LONG TERM GOALS: Target date: 04/19/2024    Patient will report at least 75% improvement in symptoms for improved quality of life. Baseline:  Goal status:  INITIAL  2.  Patient will improve LEFS score by at least 15 points in order to indicate improved tolerance to activity. Baseline:  Goal status: INITIAL  3.  Patient will be able to navigate stairs with reciprocal pattern without compensation in order to demonstrate improved LE strength. Baseline:  Goal status: INITIAL  4.  Patient will demonstrate MMT 80% of opposite lower extremity in all tested musculature as evidence of improved strength to assist with stair ambulation and gait. Baseline:  Goal status: INITIAL  5.  Patient will be able to return to all activities unrestricted for improved ability to perform work functions and participate with family.  Baseline:  Goal status: INITIAL    PLAN:  PT FREQUENCY: 1-2x/week  PT DURATION: 12 weeks  PLANNED INTERVENTIONS: 97164- PT Re-evaluation, 97110-Therapeutic exercises, 97530- Therapeutic activity, [id number]- Neuromuscular re-education, 97535- Self Care, 02859- Manual therapy, [id number]- Gait training, [phone]- Orthotic Fit/training, [phone]- Canalith repositioning, [id number]- Aquatic Therapy, [phone]- Splinting, [phone]- Wound care (first 20 sq cm), 97598- Wound care (each additional 20 sq cm)Patient/Family education, Balance training, Stair training, Taping, Dry Needling, Joint mobilization, Joint manipulation, Spinal manipulation, Spinal mobilization, Scar mobilization, and DME instructions.  PLAN FOR NEXT SESSION: progress with glute tendon transfer protocol  Delon Aquas, PTA 03/06/24 1:34 PM [facility] [address] [place], [place], [address] Phone: [phone]   Fax:  [phone]

## 2024-03-07 ENCOUNTER — Encounter (HOSPITAL_BASED_OUTPATIENT_CLINIC_OR_DEPARTMENT_OTHER): Admitting: Orthopaedic Surgery

## 2024-03-08 ENCOUNTER — Encounter (HOSPITAL_BASED_OUTPATIENT_CLINIC_OR_DEPARTMENT_OTHER): Payer: Self-pay | Admitting: Physical Therapy

## 2024-03-08 ENCOUNTER — Ambulatory Visit (HOSPITAL_BASED_OUTPATIENT_CLINIC_OR_DEPARTMENT_OTHER): Admitting: Physical Therapy

## 2024-03-08 DIAGNOSIS — M25552 Pain in left hip: Secondary | ICD-10-CM | POA: Diagnosis not present

## 2024-03-08 DIAGNOSIS — R2689 Other abnormalities of gait and mobility: Secondary | ICD-10-CM | POA: Diagnosis not present

## 2024-03-08 DIAGNOSIS — M6281 Muscle weakness (generalized): Secondary | ICD-10-CM | POA: Diagnosis not present

## 2024-03-08 DIAGNOSIS — M25652 Stiffness of left hip, not elsewhere classified: Secondary | ICD-10-CM

## 2024-03-08 NOTE — Therapy (Signed)
 OUTPATIENT PHYSICAL THERAPY TREATMENT   Patient Name: Taylor Singleton MRN: [id number] DOB:1955/07/18, 68 y.o., female Today's Date: 03/08/2024  END OF SESSION:  PT End of Session - 03/08/24 1309     Visit Number 9    Number of Visits 24    Date for Recertification  04/19/24    Authorization Type [employer] MEDICARE    Progress Note Due on Visit 10    PT Start Time 1312    PT Stop Time 1352    PT Time Calculation (min) 40 min    Behavior During Therapy WFL for tasks assessed/performed             Past Medical History:  Diagnosis Date   Anxiety    Arthritis    Chronic idiopathic constipation    GERD (gastroesophageal reflux disease)    HLD (hyperlipidemia)    Hyperlipidemia    Osteoporosis    Prolapse of posterior vaginal wall    Sciatica    Vaginal vault 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    Varicose vein of leg    Wears glasses    Past Surgical History:  Procedure Laterality Date   ANTERIOR AND POSTERIOR REPAIR WITH SACROSPINOUS FIXATION N/A 07/19/2022   Procedure: POSTERIOR REPAIR WITHP PERINEORRHAPHY AND  SACROSPINOUS FIXATION;  Surgeon: Marilynne Rosaline SAILOR, MD;  Location: [facility] [place];  Service: Gynecology;  Laterality: N/A;  Total time requested is 1.5hrs   BREAST CYST EXCISION Left    CHOLECYSTECTOMY     CHOLECYSTECTOMY, LAPAROSCOPIC  2010   COLONOSCOPY WITH PROPOFOL  N/A 03/22/2023   Procedure: COLONOSCOPY WITH PROPOFOL ;  Surgeon: Eartha Angelia Sieving, MD;  Location: [facility];  Service: Gastroenterology;  Laterality: N/A;  8:45AM;ASA 1   GLUTEUS MINIMUS REPAIR Left 01/24/2024   Procedure: REPAIR, TENDON, GLUTEUS MINIMUS;  Surgeon: Genelle Standing, MD;  Location: [facility];  Service: Orthopedics;  Laterality: Left;  LEFT GLUTEUS MAXIMUS TENDON TRANSFER   TOTAL ABDOMINAL HYSTERECTOMY W/ BILATERAL SALPINGOOPHORECTOMY     1990s   Patient Active Problem List   Diagnosis Date Noted   Tear of left gluteus medius tendon 01/24/2024    Candidiasis, intertrigo 08/26/2023   Seborrheic keratoses 08/26/2023   Greater trochanteric pain syndrome 05/30/2023   Acute cystitis without hematuria 05/30/2023   Encounter for colorectal cancer screening 03/22/2023   Nausea without vomiting 03/17/2023   Flatulence 03/17/2023   S/P hysterectomy 10/06/2022   Urinary frequency 10/06/2022   Vaginal burning 10/06/2022   Vaginal irritation 10/06/2022   Vulvar irritation 10/06/2022   Sebaceous cyst of labia 10/06/2022   BPPV (benign paroxysmal positional vertigo), left 01/29/2022   Laryngopharyngeal reflux (LPR) 01/29/2022   Pharyngoesophageal dysphagia 01/29/2022   HLD (hyperlipidemia)     PCP: Duanne Butler DASEN, MD  REFERRING PROVIDER: Genelle Standing, MD  REFERRING DIAG: [phone] (ICD-10-CM) - Pain in left hip 1. Left hip gluteus maximus tendon transfer 2. Left hip trochanteric bursectomy; DOS 01/24/24  THERAPY DIAG:  Stiffness of left hip, not elsewhere classified  Pain in left hip  Other abnormalities of gait and mobility  Muscle weakness (generalized)  Rationale for Evaluation and Treatment: Rehabilitation  ONSET DATE: DOS 01/24/24  SUBJECTIVE:   SUBJECTIVE STATEMENT: Pt reports she was sore the night of last session - but she also repeated all of the exercises again that night.  Exercises at home are going ok.    PERTINENT HISTORY: HLD, osteoporosis PAIN:  Are you having pain? no: NPRS scale: 0/10 Pain location: L hip Pain description: just stiff  Aggravating factors: walking Relieving factors: rest  PRECAUTIONS: Other: Left hip gluteus maximus tendon transfer  WEIGHT BEARING RESTRICTIONS: WBAT  FALLS:  Has patient fallen in last 6 months? No  PLOF: Independent  PATIENT GOALS: be able to function normal again  OBJECTIVE: (objective measures from initial evaluation unless otherwise dated)  PATIENT SURVEYS:  LEFS  Extreme difficulty/unable (0), Quite a bit of difficulty (1), Moderate difficulty  (2), Little difficulty (3), No difficulty (4) Survey date:  01/26/24  Any of your usual work, housework or school activities 3  2. Usual hobbies, recreational or sporting activities 3  3. Getting into/out of the bath 0  4. Walking between rooms 4  5. Putting on socks/shoes 4  6. Squatting  4  7. Lifting an object, like a bag of groceries from the floor 4  8. Performing light activities around your home 3  9. Performing heavy activities around your home 3  10. Getting into/out of a car 4  11. Walking 2 blocks 3  12. Walking 1 mile 1  13. Going up/down 10 stairs (1 flight) 4  14. Standing for 1 hour 3  15.  sitting for 1 hour 4  16. Running on even ground 1  17. Running on uneven ground 1  18. Making sharp turns while running fast 1  19. Hopping  1  20. Rolling over in bed 3  Score total:  54/80     COGNITION: Overall cognitive status: Within functional limits for tasks assessed     SENSATION: WFL   POSTURE: rounded shoulders and forward head  PALPATION: Grossly tender lateral L hip  LOWER EXTREMITY ROM:  Passive ROM Right eval Left eval  Hip flexion  > 90  Hip extension    Hip abduction    Hip adduction    Hip internal rotation    Hip external rotation    Knee flexion    Knee extension    Ankle dorsiflexion    Ankle plantarflexion    Ankle inversion    Ankle eversion     (Blank rows = not tested) *= pain/symptoms  LOWER EXTREMITY MMT:  MMT Right eval Left eval  Hip flexion    Hip extension    Hip abduction    Hip adduction    Hip internal rotation    Hip external rotation    Knee flexion 5 5  Knee extension 5 4+  Ankle dorsiflexion    Ankle plantarflexion    Ankle inversion    Ankle eversion     (Blank rows = not tested) *= pain/symptoms   FUNCTIONAL TESTS:  NT Transfer: labored with UE support  GAIT: Distance walked: 100 feet Assistive device utilized: rollator Level of assistance: Modified independence Comments: slightly flexed trunk  with rollator, antalgic on L    TODAY'S TREATMENT:  DATE:  03/08/24 Scifit L2, 5 min UE on rails: hip abdct to toe touch 3x5;  hip extension to toe tap 3x5 - each LE; Bil calf raises x 10 Lt forward step up 2 x 10 with RUE on rail; repeated on 4 x 10 Lt lateral step up 2 x 10 with BUE on rail; repeated on 4 x 10 Side stepping R/L with UE on rails Seated quad stretch with foot under chair, sliding buttocks forward 20sec x 2 LLE, x 1 RLE On Airex pad: slow marching, intermittent UE on rails; narrow BOS eyes open, eyes closed x 10sec; semi tandem stance x 15sec each Gait:  25 ft x 5, with SPC cues for increasing Rt step height (to avoid shuffle),even stance time, Lt arm swing.   03/06/24 Scifit L1-2, 6 min LAQ 3# 5 2x10 Standing weight shifts on scales  Squats with light UE support Bil calf raises x 10 Standing high knee marching with UE on rails Side stepping R/L with UE on rails Trial of hip rotation with Lt lower leg on stool; limited UE on rails: Lt hip abdct to toe touch x 10; Lt hip extension to toe tap x 10 Prone Lt knee flexion with hip IR/ER, range to tolerance  02/27/24 Bike 5 minutes for hip mobility L2 PROM L hip within protocol limits Gait with SPC- x160ft. Cues for increased step length Hooklying hip abduction isometric with belt 15 x 5-10 second holds LAQ 2# 5 2x10 Partial bridges 2x10 Hooklying ball squeeze 2x10 5 hold Standing HR 2x10 Standing hip extension 2x10  02/23/24 Bike 5 minutes for hip mobility Bridge 2 x 10 Hooklying hip abduction isometric with belt 15 x 5-10 second holds Prone hip extension 2 x 10 Standing hip extension 2 x 10   02/20/24 Bike 5 minutes for hip mobility PROM L hip within protocol limits Gait with SPC- x150ft. Cues for increased step length LAQ 3 x 10 Partial bridges 2x10 Hooklying ball squeeze 2x10 5  hold Standing HR 2x10 Seated marching 2x10 HEP update/review   02/15/24 Bike 5 minutes for hip mobility PROM L hip within protocol limits Gait with SPC- x165ft. Cues for increased step length LAQ 3 x 10 Partial bridges 2x10 Hooklying ball squeeze 2x10 5 hold Standing HR 2x10 Seated marching 2x10 (trialled standing, but too painful) Discussion of precautions/restrictions, activity modifications.      PATIENT EDUCATION:  Education details: exercise progression/ modification Person educated: Patient Education method: Solicitor, Education comprehension: verbalized understanding, returned demonstration, verbal cues required, and tactile cues required  HOME EXERCISE PROGRAM: Access Code: [email or web address] URL: [email or web address] Updated 03/06/24  ASSESSMENT:  CLINICAL IMPRESSION: Pt is 6wks 2day s/p Lt glute tendon transfer. She tolerated forward and lateral step ups well (2 and 4).  Advised pt to not over-do it at home and to listen to body.  Will continue to progress as tolerated.  Therapist to assess goals for 10th visit progress note next visit.     OBJECTIVE IMPAIRMENTS: Abnormal gait, decreased activity tolerance, decreased balance, decreased endurance, decreased mobility, difficulty walking, decreased ROM, decreased strength, increased muscle spasms, impaired flexibility, improper body mechanics, and pain  ACTIVITY LIMITATIONS: lifting, bending, sitting, standing, squatting, sleeping, stairs, transfers, locomotion level, and caring for others  PARTICIPATION LIMITATIONS: meal prep, cleaning, laundry, shopping, community activity, occupation, and yard work  PERSONAL FACTORS: Time since onset of injury/illness/exacerbation and 1-2 comorbidities: HLD, osteoporosis are also affecting patient's functional outcome.   REHAB POTENTIAL: Good  CLINICAL DECISION MAKING: Evolving/moderate complexity  EVALUATION COMPLEXITY: Moderate   GOALS: Goals  reviewed with patient? Yes  SHORT TERM GOALS: Target date: 03/08/2024    Patient will be independent with HEP in order to improve functional outcomes. Baseline: Goal status: INITIAL  2.  Patient will report at least 25% improvement in symptoms for improved quality of life. Baseline: Goal status: MET - 03/06/24  3.  Pain free flexion to 90 Baseline:  Goal status:MET   4.  SLS 30s without compensation Baseline: (will trial closer to 8wks per protocol) Goal status: INITIAL   LONG TERM GOALS: Target date: 04/19/2024    Patient will report at least 75% improvement in symptoms for improved quality of life. Baseline:  Goal status: INITIAL  2.  Patient will improve LEFS score by at least 15 points in order to indicate improved tolerance to activity. Baseline:  Goal status: INITIAL  3.  Patient will be able to navigate stairs with reciprocal pattern without compensation in order to demonstrate improved LE strength. Baseline:  Goal status: INITIAL  4.  Patient will demonstrate MMT 80% of opposite lower extremity in all tested musculature as evidence of improved strength to assist with stair ambulation and gait. Baseline:  Goal status: INITIAL  5.  Patient will be able to return to all activities unrestricted for improved ability to perform work functions and participate with family.  Baseline:  Goal status: INITIAL    PLAN:  PT FREQUENCY: 1-2x/week  PT DURATION: 12 weeks  PLANNED INTERVENTIONS: [id number]- PT Re-evaluation, 97110-Therapeutic exercises, 97530- Therapeutic activity, [id number]- Neuromuscular re-education, 97535- Self Care, [id number]- Manual therapy, [id number]- Gait training, [phone]- Orthotic Fit/training, [phone]- Canalith repositioning, [id number]- Aquatic Therapy, [phone]- Splinting, [phone]- Wound care (first 20 sq cm), 97598- Wound care (each additional 20 sq cm)Patient/Family education, Balance training, Stair training, Taping, Dry Needling, Joint mobilization, Joint manipulation,  Spinal manipulation, Spinal mobilization, Scar mobilization, and DME instructions.  PLAN FOR NEXT SESSION: progress with glute tendon transfer protocol  Delon Aquas, PTA 03/08/24 2:10 PM [facility] [address] [place], [place], [address] Phone: [phone]   Fax:  [phone]

## 2024-03-13 ENCOUNTER — Encounter (HOSPITAL_BASED_OUTPATIENT_CLINIC_OR_DEPARTMENT_OTHER): Payer: Self-pay

## 2024-03-13 ENCOUNTER — Ambulatory Visit (HOSPITAL_BASED_OUTPATIENT_CLINIC_OR_DEPARTMENT_OTHER)

## 2024-03-13 DIAGNOSIS — M6281 Muscle weakness (generalized): Secondary | ICD-10-CM

## 2024-03-13 DIAGNOSIS — R2689 Other abnormalities of gait and mobility: Secondary | ICD-10-CM

## 2024-03-13 DIAGNOSIS — M25652 Stiffness of left hip, not elsewhere classified: Secondary | ICD-10-CM | POA: Diagnosis not present

## 2024-03-13 DIAGNOSIS — M25552 Pain in left hip: Secondary | ICD-10-CM | POA: Diagnosis not present

## 2024-03-13 NOTE — Therapy (Cosign Needed)
 OUTPATIENT PHYSICAL THERAPY TREATMENT Progress Note Reporting Period 01/26/24 to 03/13/24  See note below for Objective Data and Assessment of Progress/Goals.       Patient Name: Taylor Singleton MRN: [id number] DOB:07-13-1955, 68 y.o., female Today's Date: 03/13/2024  END OF SESSION:  PT End of Session - 03/13/24 1021     Visit Number 10    Number of Visits 24    Date for Recertification  04/19/24    Authorization Type [employer] MEDICARE    Progress Note Due on Visit 10    PT Start Time 1018    PT Stop Time 1100    PT Time Calculation (min) 42 min    Activity Tolerance Patient tolerated treatment well    Behavior During Therapy WFL for tasks assessed/performed              Past Medical History:  Diagnosis Date   Anxiety    Arthritis    Chronic idiopathic constipation    GERD (gastroesophageal reflux disease)    HLD (hyperlipidemia)    Hyperlipidemia    Osteoporosis    Prolapse of posterior vaginal wall    Sciatica    Vaginal vault 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    Varicose vein of leg    Wears glasses    Past Surgical History:  Procedure Laterality Date   ANTERIOR AND POSTERIOR REPAIR WITH SACROSPINOUS FIXATION N/A 07/19/2022   Procedure: POSTERIOR REPAIR WITHP PERINEORRHAPHY AND  SACROSPINOUS FIXATION;  Surgeon: Marilynne Rosaline SAILOR, MD;  Location: [facility];  Service: Gynecology;  Laterality: N/A;  Total time requested is 1.5hrs   BREAST CYST EXCISION Left    CHOLECYSTECTOMY     CHOLECYSTECTOMY, LAPAROSCOPIC  2010   COLONOSCOPY WITH PROPOFOL  N/A 03/22/2023   Procedure: COLONOSCOPY WITH PROPOFOL ;  Surgeon: Eartha Angelia Sieving, MD;  Location: [facility];  Service: Gastroenterology;  Laterality: N/A;  8:45AM;ASA 1   GLUTEUS MINIMUS REPAIR Left 01/24/2024   Procedure: REPAIR, TENDON, GLUTEUS MINIMUS;  Surgeon: Genelle Standing, MD;  Location: [facility];  Service: Orthopedics;  Laterality: Left;  LEFT GLUTEUS MAXIMUS TENDON  TRANSFER   TOTAL ABDOMINAL HYSTERECTOMY W/ BILATERAL SALPINGOOPHORECTOMY     1990s   Patient Active Problem List   Diagnosis Date Noted   Tear of left gluteus medius tendon 01/24/2024   Candidiasis, intertrigo 08/26/2023   Seborrheic keratoses 08/26/2023   Greater trochanteric pain syndrome 05/30/2023   Acute cystitis without hematuria 05/30/2023   Encounter for colorectal cancer screening 03/22/2023   Nausea without vomiting 03/17/2023   Flatulence 03/17/2023   S/P hysterectomy 10/06/2022   Urinary frequency 10/06/2022   Vaginal burning 10/06/2022   Vaginal irritation 10/06/2022   Vulvar irritation 10/06/2022   Sebaceous cyst of labia 10/06/2022   BPPV (benign paroxysmal positional vertigo), left 01/29/2022   Laryngopharyngeal reflux (LPR) 01/29/2022   Pharyngoesophageal dysphagia 01/29/2022   HLD (hyperlipidemia)     PCP: Duanne Butler DASEN, MD  REFERRING PROVIDER: Genelle Standing, MD  REFERRING DIAG: [phone] (ICD-10-CM) - Pain in left hip 1. Left hip gluteus maximus tendon transfer 2. Left hip trochanteric bursectomy; DOS 01/24/24  THERAPY DIAG:  Pain in left hip  Stiffness of left hip, not elsewhere classified  Other abnormalities of gait and mobility  Muscle weakness (generalized)  Rationale for Evaluation and Treatment: Rehabilitation  ONSET DATE: DOS 01/24/24  SUBJECTIVE:   SUBJECTIVE STATEMENT: Pt reports she feels like she has back tracked a little.It's more sore today. Pt has been trying to be more aware  of her posture. She reports L sided hip soreness every morning that improves after moving.   PERTINENT HISTORY: HLD, osteoporosis PAIN:  Are you having pain? no: NPRS scale: 2-3/10 Pain location: L hip Pain description: just stiff  Aggravating factors: walking Relieving factors: rest  PRECAUTIONS: Other: Left hip gluteus maximus tendon transfer  WEIGHT BEARING RESTRICTIONS: WBAT  FALLS:  Has patient fallen in last 6 months? No  PLOF:  Independent  PATIENT GOALS: be able to function normal again  OBJECTIVE: (objective measures from initial evaluation unless otherwise dated)  PATIENT SURVEYS:  LEFS  Extreme difficulty/unable (0), Quite a bit of difficulty (1), Moderate difficulty (2), Little difficulty (3), No difficulty (4) Survey date:  01/26/24 03/13/24  Any of your usual work, housework or school activities 3 2  2. Usual hobbies, recreational or sporting activities 3 3  3. Getting into/out of the bath 0 4  4. Walking between rooms 4 3  5. Putting on socks/shoes 4 3  6. Squatting  4 4  7. Lifting an object, like a bag of groceries from the floor 4 4  8. Performing light activities around your home 3 4  9. Performing heavy activities around your home 3 2  10. Getting into/out of a car 4 4  11. Walking 2 blocks 3 2  12. Walking 1 mile 1 0  13. Going up/down 10 stairs (1 flight) 4 3  14. Standing for 1 hour 3 0  15.  sitting for 1 hour 4 2  16. Running on even ground 1 0  17. Running on uneven ground 1 0  18. Making sharp turns while running fast 1 0  19. Hopping  1 0  20. Rolling over in bed 3 4  Score total:  54/80 44/80     COGNITION: Overall cognitive status: Within functional limits for tasks assessed     SENSATION: WFL   POSTURE: rounded shoulders and forward head  PALPATION: Grossly tender lateral L hip  LOWER EXTREMITY ROM:  Passive ROM Right eval Left eval Left  9/30  Hip flexion  > 90 108 active supine  Hip extension     Hip abduction   20deg passive  Hip adduction     Hip internal rotation     Hip external rotation     Knee flexion     Knee extension     Ankle dorsiflexion     Ankle plantarflexion     Ankle inversion     Ankle eversion      (Blank rows = not tested) *= pain/symptoms  LOWER EXTREMITY MMT:  MMT Right eval Left eval Left  9/30  Hip flexion   5  Hip extension     Hip abduction     Hip adduction     Hip internal rotation     Hip external rotation      Knee flexion 5 5 5   Knee extension 5 4+ 4+  Ankle dorsiflexion     Ankle plantarflexion     Ankle inversion     Ankle eversion      (Blank rows = not tested) *= pain/symptoms   FUNCTIONAL TESTS:  NT Transfer: labored with UE support  GAIT: Distance walked: 100 feet Assistive device utilized: rollator Level of assistance: Modified independence Comments: slightly flexed trunk with rollator, antalgic on L    TODAY'S TREATMENT:  DATE:   03/13/24 Sci fit bike L2 x29min Updated MMT and ROM Goal review Bridges x20 Sidelying clam 2x10 LAQ 2# 5 2x10 Sit to stands x10  Step ups fwd 4 2x10   03/08/24 Scifit L2, 5 min UE on rails: hip abdct to toe touch 3x5;  hip extension to toe tap 3x5 - each LE; Bil calf raises x 10 Lt forward step up 2 x 10 with RUE on rail; repeated on 4 x 10 Lt lateral step up 2 x 10 with BUE on rail; repeated on 4 x 10 Side stepping R/L with UE on rails Seated quad stretch with foot under chair, sliding buttocks forward 20sec x 2 LLE, x 1 RLE On Airex pad: slow marching, intermittent UE on rails; narrow BOS eyes open, eyes closed x 10sec; semi tandem stance x 15sec each Gait:  25 ft x 5, with SPC cues for increasing Rt step height (to avoid shuffle),even stance time, Lt arm swing.   03/06/24 Scifit L1-2, 6 min LAQ 3# 5 2x10 Standing weight shifts on scales  Squats with light UE support Bil calf raises x 10 Standing high knee marching with UE on rails Side stepping R/L with UE on rails Trial of hip rotation with Lt lower leg on stool; limited UE on rails: Lt hip abdct to toe touch x 10; Lt hip extension to toe tap x 10 Prone Lt knee flexion with hip IR/ER, range to tolerance  02/27/24 Bike 5 minutes for hip mobility L2 PROM L hip within protocol limits Gait with SPC- x140ft. Cues for increased step length Hooklying hip  abduction isometric with belt 15 x 5-10 second holds LAQ 2# 5 2x10 Partial bridges 2x10 Hooklying ball squeeze 2x10 5 hold Standing HR 2x10 Standing hip extension 2x10   PATIENT EDUCATION:  Education details: exercise progression/ modification Person educated: Patient Education method: Programmer, multimedia, Demonstration, Education comprehension: verbalized understanding, returned demonstration, verbal cues required, and tactile cues required  HOME EXERCISE PROGRAM: Access Code: [email or web address] URL: [email or web address] Updated 03/06/24  ASSESSMENT:  CLINICAL IMPRESSION: Pt has attended 10 visits of PT thus far and is making steady progress towards. Pt is now 7 weeks s/p glute medius repair and bursectomy. Pt continues to ambulate using SPC. She reports only mild pain/discomfort at this time which fluctuates based on activity/positioning. LEFS score has decreased to 44/80. Reviewed protocol and current restrictions with pt with verbalized understanding. Updated ROM and MMT reflect improvements. Mild guarding present with passive abduction. Pt has met 3/4 STG and is working towards all LTGs. Pt will continue to benefit from PT to improve functional strength and ROM.   Patient has met 3/4 short term goals and 0/5 long term goals with ability to complete HEP and improvement in symptoms, strength, ROM, activity tolerance, and functional mobility. Remaining goals not met due to continued deficits in balance, gait, strength, activity tolerance, functional mobility. Patient has made good progress toward remaining goals and is overall progressing very well. Patient will continue to benefit from skilled physical therapy in order to improve function and reduce impairment.  7:56 AM, 03/14/24 Prentice CANDIE Stains PT, DPT Physical Therapist at [facility]   OBJECTIVE IMPAIRMENTS: Abnormal gait, decreased activity tolerance, decreased balance, decreased endurance, decreased mobility, difficulty  walking, decreased ROM, decreased strength, increased muscle spasms, impaired flexibility, improper body mechanics, and pain  ACTIVITY LIMITATIONS: lifting, bending, sitting, standing, squatting, sleeping, stairs, transfers, locomotion level, and caring for others  PARTICIPATION LIMITATIONS: meal prep, cleaning, laundry, shopping, community activity, occupation, and yard  work  PERSONAL FACTORS: Time since onset of injury/illness/exacerbation and 1-2 comorbidities: HLD, osteoporosis are also affecting patient's functional outcome.   REHAB POTENTIAL: Good  CLINICAL DECISION MAKING: Evolving/moderate complexity  EVALUATION COMPLEXITY: Moderate   GOALS: Goals reviewed with patient? Yes  SHORT TERM GOALS: Target date: 03/08/2024    Patient will be independent with HEP in order to improve functional outcomes. Baseline: Goal status: MET 03/13/24  2.  Patient will report at least 25% improvement in symptoms for improved quality of life. Baseline: Goal status: MET - 03/06/24  3.  Pain free flexion to 90 Baseline:  Goal status:MET   4.  SLS 30s without compensation Baseline: (will trial closer to 8wks per protocol) Goal status: INITIAL   LONG TERM GOALS: Target date: 04/19/2024    Patient will report at least 75% improvement in symptoms for improved quality of life. Baseline:  Goal status: IN PROGRESS  2.  Patient will improve LEFS score by at least 15 points in order to indicate improved tolerance to activity. Baseline:  Goal status: NOT MET 9/30  3.  Patient will be able to navigate stairs with reciprocal pattern without compensation in order to demonstrate improved LE strength. Baseline:  Goal status: IN PROGRESS  4.  Patient will demonstrate MMT 80% of opposite lower extremity in all tested musculature as evidence of improved strength to assist with stair ambulation and gait. Baseline:  Goal status: IN PROGRESS 03/13/24  5.  Patient will be able to return to all  activities unrestricted for improved ability to perform work functions and participate with family.  Baseline:  Goal status: IN PROGRESS    PLAN:  PT FREQUENCY: 1-2x/week  PT DURATION: 12 weeks  PLANNED INTERVENTIONS: [id number]- PT Re-evaluation, 97110-Therapeutic exercises, 97530- Therapeutic activity, [id number]- Neuromuscular re-education, 97535- Self Care, [id number]- Manual therapy, [phone]- Gait training, [phone]- Orthotic Fit/training, [phone]- Canalith repositioning, [id number]- Aquatic Therapy, [phone]- Splinting, [phone]- Wound care (first 20 sq cm), 97598- Wound care (each additional 20 sq cm)Patient/Family education, Balance training, Stair training, Taping, Dry Needling, Joint mobilization, Joint manipulation, Spinal manipulation, Spinal mobilization, Scar mobilization, and DME instructions.  PLAN FOR NEXT SESSION: progress with glute tendon transfer protocol  Asberry Rodes, PTA  03/13/24 11:47 AM [facility] [address] [place], [place], [address] Phone: [phone]   Fax:  [phone]

## 2024-03-14 ENCOUNTER — Telehealth (HOSPITAL_BASED_OUTPATIENT_CLINIC_OR_DEPARTMENT_OTHER): Payer: Self-pay | Admitting: Orthopaedic Surgery

## 2024-03-14 NOTE — Telephone Encounter (Signed)
 Attempted CB but line disconnected

## 2024-03-14 NOTE — Telephone Encounter (Signed)
 Patient wants to know if she can get a massage, Patient also needs to get her handicap plaquard renewed she will pick it up tomorrow . She can not use my chart best contact [phone]

## 2024-03-15 ENCOUNTER — Ambulatory Visit (INDEPENDENT_AMBULATORY_CARE_PROVIDER_SITE_OTHER): Admitting: Family Medicine

## 2024-03-15 ENCOUNTER — Encounter (HOSPITAL_BASED_OUTPATIENT_CLINIC_OR_DEPARTMENT_OTHER): Payer: Self-pay

## 2024-03-15 ENCOUNTER — Encounter: Payer: Self-pay | Admitting: Family Medicine

## 2024-03-15 ENCOUNTER — Ambulatory Visit (HOSPITAL_BASED_OUTPATIENT_CLINIC_OR_DEPARTMENT_OTHER): Attending: Orthopaedic Surgery

## 2024-03-15 VITALS — BP 132/74 | HR 96 | Temp 98.2°F | Ht 61.0 in | Wt 147.8 lb

## 2024-03-15 DIAGNOSIS — F419 Anxiety disorder, unspecified: Secondary | ICD-10-CM

## 2024-03-15 DIAGNOSIS — M25552 Pain in left hip: Secondary | ICD-10-CM | POA: Insufficient documentation

## 2024-03-15 DIAGNOSIS — R2689 Other abnormalities of gait and mobility: Secondary | ICD-10-CM | POA: Diagnosis not present

## 2024-03-15 DIAGNOSIS — M25652 Stiffness of left hip, not elsewhere classified: Secondary | ICD-10-CM | POA: Diagnosis not present

## 2024-03-15 DIAGNOSIS — M6281 Muscle weakness (generalized): Secondary | ICD-10-CM | POA: Diagnosis not present

## 2024-03-15 NOTE — Therapy (Signed)
 OUTPATIENT PHYSICAL THERAPY TREATMENT      Patient Name: Taylor Singleton MRN: [id number] DOB:12/13/1955, 68 y.o., female Today's Date: 03/15/2024  END OF SESSION:  PT End of Session - 03/15/24 1529     Visit Number 11    Number of Visits 24    Date for Recertification  04/19/24    Authorization Type BCBS MEDICARE    Progress Note Due on Visit 20    PT Start Time 1019    PT Stop Time 1100    PT Time Calculation (min) 41 min    Activity Tolerance Patient tolerated treatment well    Behavior During Therapy WFL for tasks assessed/performed               Past Medical History:  Diagnosis Date   Anxiety    Arthritis    Chronic idiopathic constipation    GERD (gastroesophageal reflux disease)    HLD (hyperlipidemia)    Hyperlipidemia    Osteoporosis    Prolapse of posterior vaginal wall    Sciatica    Vaginal vault 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    Varicose vein of leg    Wears glasses    Past Surgical History:  Procedure Laterality Date   ANTERIOR AND POSTERIOR REPAIR WITH SACROSPINOUS FIXATION N/A 07/19/2022   Procedure: POSTERIOR REPAIR WITHP PERINEORRHAPHY AND  SACROSPINOUS FIXATION;  Surgeon: Marilynne Rosaline SAILOR, MD;  Location: [facility];  Service: Gynecology;  Laterality: N/A;  Total time requested is 1.5hrs   BREAST CYST EXCISION Left    CHOLECYSTECTOMY     CHOLECYSTECTOMY, LAPAROSCOPIC  2010   COLONOSCOPY WITH PROPOFOL  N/A 03/22/2023   Procedure: COLONOSCOPY WITH PROPOFOL ;  Surgeon: Eartha Angelia Sieving, MD;  [facility];  Service: Gastroenterology;  Laterality: N/A;  8:45AM;ASA 1   GLUTEUS MINIMUS REPAIR Left 01/24/2024   Procedure: REPAIR, TENDON, GLUTEUS MINIMUS;  Surgeon: Genelle Standing, MD;  Location: [facility];  Service: Orthopedics;  Laterality: Left;  LEFT GLUTEUS MAXIMUS TENDON TRANSFER   TOTAL ABDOMINAL HYSTERECTOMY W/ BILATERAL SALPINGOOPHORECTOMY     1990s   Patient Active Problem List   Diagnosis  Date Noted   Tear of left gluteus medius tendon 01/24/2024   Candidiasis, intertrigo 08/26/2023   Seborrheic keratoses 08/26/2023   Greater trochanteric pain syndrome 05/30/2023   Acute cystitis without hematuria 05/30/2023   Encounter for colorectal cancer screening 03/22/2023   Nausea without vomiting 03/17/2023   Flatulence 03/17/2023   S/P hysterectomy 10/06/2022   Urinary frequency 10/06/2022   Vaginal burning 10/06/2022   Vaginal irritation 10/06/2022   Vulvar irritation 10/06/2022   Sebaceous cyst of labia 10/06/2022   BPPV (benign paroxysmal positional vertigo), left 01/29/2022   Laryngopharyngeal reflux (LPR) 01/29/2022   Pharyngoesophageal dysphagia 01/29/2022   HLD (hyperlipidemia)     PCP: Duanne Butler DASEN, MD  REFERRING PROVIDER: Genelle Standing, MD  REFERRING DIAG: [phone] (ICD-10-CM) - Pain in left hip 1. Left hip gluteus maximus tendon transfer 2. Left hip trochanteric bursectomy; DOS 01/24/24  THERAPY DIAG:  Pain in left hip  Stiffness of left hip, not elsewhere classified  Other abnormalities of gait and mobility  Muscle weakness (generalized)  Rationale for Evaluation and Treatment: Rehabilitation  ONSET DATE: DOS 01/24/24  SUBJECTIVE:   SUBJECTIVE STATEMENT: No new complaints at entry. Hurts more in the mornings.   PERTINENT HISTORY: HLD, osteoporosis PAIN:  Are you having pain? no: NPRS scale: 2-3/10 Pain location: L hip Pain description: just stiff  Aggravating factors: walking Relieving factors:  rest  PRECAUTIONS: Other: Left hip gluteus maximus tendon transfer  WEIGHT BEARING RESTRICTIONS: WBAT  FALLS:  Has patient fallen in last 6 months? No  PLOF: Independent  PATIENT GOALS: be able to function normal again  OBJECTIVE: (objective measures from initial evaluation unless otherwise dated)  PATIENT SURVEYS:  LEFS  Extreme difficulty/unable (0), Quite a bit of difficulty (1), Moderate difficulty (2), Little difficulty (3),  No difficulty (4) Survey date:  01/26/24 03/13/24  Any of your usual work, housework or school activities 3 2  2. Usual hobbies, recreational or sporting activities 3 3  3. Getting into/out of the bath 0 4  4. Walking between rooms 4 3  5. Putting on socks/shoes 4 3  6. Squatting  4 4  7. Lifting an object, like a bag of groceries from the floor 4 4  8. Performing light activities around your home 3 4  9. Performing heavy activities around your home 3 2  10. Getting into/out of a car 4 4  11. Walking 2 blocks 3 2  12. Walking 1 mile 1 0  13. Going up/down 10 stairs (1 flight) 4 3  14. Standing for 1 hour 3 0  15.  sitting for 1 hour 4 2  16. Running on even ground 1 0  17. Running on uneven ground 1 0  18. Making sharp turns while running fast 1 0  19. Hopping  1 0  20. Rolling over in bed 3 4  Score total:  54/80 44/80     COGNITION: Overall cognitive status: Within functional limits for tasks assessed     SENSATION: WFL   POSTURE: rounded shoulders and forward head  PALPATION: Grossly tender lateral L hip  LOWER EXTREMITY ROM:  Passive ROM Right eval Left eval Left  9/30  Hip flexion  > 90 108 active supine  Hip extension     Hip abduction   20deg passive  Hip adduction     Hip internal rotation     Hip external rotation     Knee flexion     Knee extension     Ankle dorsiflexion     Ankle plantarflexion     Ankle inversion     Ankle eversion      (Blank rows = not tested) *= pain/symptoms  LOWER EXTREMITY MMT:  MMT Right eval Left eval Left  9/30  Hip flexion   5  Hip extension     Hip abduction     Hip adduction     Hip internal rotation     Hip external rotation     Knee flexion 5 5 5   Knee extension 5 4+ 4+  Ankle dorsiflexion     Ankle plantarflexion     Ankle inversion     Ankle eversion      (Blank rows = not tested) *= pain/symptoms   FUNCTIONAL TESTS:  NT Transfer: labored with UE support  GAIT: Distance walked: 100  feet Assistive device utilized: rollator Level of assistance: Modified independence Comments: slightly flexed trunk with rollator, antalgic on L    TODAY'S TREATMENT:  DATE:   03/15/24 Sci fit bike L2 x78min Bridges x20 Sidelying clam 2x10 LAQ 2# 5 2x10 Sit to stands 2x10 (cues for trunk flexion) Step ups fwd 4 2x10 Side stepping 3x 28ft Stepping over single hurdle x5  03/13/24 Sci fit bike L2 x62min Updated MMT and ROM Goal review Bridges x20 Sidelying clam 2x10 LAQ 2# 5 2x10 Sit to stands x10  Step ups fwd 4 2x10   03/08/24 Scifit L2, 5 min UE on rails: hip abdct to toe touch 3x5;  hip extension to toe tap 3x5 - each LE; Bil calf raises x 10 Lt forward step up 2 x 10 with RUE on rail; repeated on 4 x 10 Lt lateral step up 2 x 10 with BUE on rail; repeated on 4 x 10 Side stepping R/L with UE on rails Seated quad stretch with foot under chair, sliding buttocks forward 20sec x 2 LLE, x 1 RLE On Airex pad: slow marching, intermittent UE on rails; narrow BOS eyes open, eyes closed x 10sec; semi tandem stance x 15sec each Gait:  25 ft x 5, with SPC cues for increasing Rt step height (to avoid shuffle),even stance time, Lt arm swing.   03/06/24 Scifit L1-2, 6 min LAQ 3# 5 2x10 Standing weight shifts on scales  Squats with light UE support Bil calf raises x 10 Standing high knee marching with UE on rails Side stepping R/L with UE on rails Trial of hip rotation with Lt lower leg on stool; limited UE on rails: Lt hip abdct to toe touch x 10; Lt hip extension to toe tap x 10 Prone Lt knee flexion with hip IR/ER, range to tolerance  02/27/24 Bike 5 minutes for hip mobility L2 PROM L hip within protocol limits Gait with SPC- x182ft. Cues for increased step length Hooklying hip abduction isometric with belt 15 x 5-10 second holds LAQ 2# 5  2x10 Partial bridges 2x10 Hooklying ball squeeze 2x10 5 hold Standing HR 2x10 Standing hip extension 2x10   PATIENT EDUCATION:  Education details: exercise progression/ modification Person educated: Patient Education method: Programmer, multimedia, Demonstration, Education comprehension: verbalized understanding, returned demonstration, verbal cues required, and tactile cues required  HOME EXERCISE PROGRAM: Access Code: [email or web address] URL: [email or web address] Updated 03/06/24  ASSESSMENT:  CLINICAL IMPRESSION:  Continued to progress therAct including step ups and sit to stands. She denied pain with most exercises. With sit to stands, she required cuing for increased trunk flexion which did improve her hip pain. With gait in hall, continued to cue pt for increased step length and improved heel contact to reduce shuffling. Practiced stepping over a hurdle to further challenge this. Will continue to progress as tolerated.    OBJECTIVE IMPAIRMENTS: Abnormal gait, decreased activity tolerance, decreased balance, decreased endurance, decreased mobility, difficulty walking, decreased ROM, decreased strength, increased muscle spasms, impaired flexibility, improper body mechanics, and pain  ACTIVITY LIMITATIONS: lifting, bending, sitting, standing, squatting, sleeping, stairs, transfers, locomotion level, and caring for others  PARTICIPATION LIMITATIONS: meal prep, cleaning, laundry, shopping, community activity, occupation, and yard work  PERSONAL FACTORS: Time since onset of injury/illness/exacerbation and 1-2 comorbidities: HLD, osteoporosis are also affecting patient's functional outcome.   REHAB POTENTIAL: Good  CLINICAL DECISION MAKING: Evolving/moderate complexity  EVALUATION COMPLEXITY: Moderate   GOALS: Goals reviewed with patient? Yes  SHORT TERM GOALS: Target date: 03/08/2024    Patient will be independent with HEP in order to improve functional  outcomes. Baseline: Goal status: MET 03/13/24  2.  Patient will report at least 25% improvement  in symptoms for improved quality of life. Baseline: Goal status: MET - 03/06/24  3.  Pain free flexion to 90 Baseline:  Goal status:MET   4.  SLS 30s without compensation Baseline: (will trial closer to 8wks per protocol) Goal status: INITIAL   LONG TERM GOALS: Target date: 04/19/2024    Patient will report at least 75% improvement in symptoms for improved quality of life. Baseline:  Goal status: IN PROGRESS  2.  Patient will improve LEFS score by at least 15 points in order to indicate improved tolerance to activity. Baseline:  Goal status: NOT MET 9/30  3.  Patient will be able to navigate stairs with reciprocal pattern without compensation in order to demonstrate improved LE strength. Baseline:  Goal status: IN PROGRESS  4.  Patient will demonstrate MMT 80% of opposite lower extremity in all tested musculature as evidence of improved strength to assist with stair ambulation and gait. Baseline:  Goal status: IN PROGRESS 03/13/24  5.  Patient will be able to return to all activities unrestricted for improved ability to perform work functions and participate with family.  Baseline:  Goal status: IN PROGRESS    PLAN:  PT FREQUENCY: 1-2x/week  PT DURATION: 12 weeks  PLANNED INTERVENTIONS: 97164- PT Re-evaluation, 97110-Therapeutic exercises, 97530- Therapeutic activity, [id number]- Neuromuscular re-education, 97535- Self Care, 02859- Manual therapy, [phone]- Gait training, [phone]- Orthotic Fit/training, [phone]- Canalith repositioning, [id number]- Aquatic Therapy, [phone]- Splinting, [phone]- Wound care (first 20 sq cm), 97598- Wound care (each additional 20 sq cm)Patient/Family education, Balance training, Stair training, Taping, Dry Needling, Joint mobilization, Joint manipulation, Spinal manipulation, Spinal mobilization, Scar mobilization, and DME instructions.  PLAN FOR NEXT SESSION: progress  with glute tendon transfer protocol  Asberry Rodes, PTA  03/15/24 3:38 PM [facility] [address] [place], [place], [address] Phone: [phone]   Fax:  [phone]

## 2024-03-15 NOTE — Progress Notes (Signed)
 Subjective:    Patient ID: Taylor Singleton, female    DOB: 1956/04/23, 68 y.o.   MRN: [id number] I have seen the patient multiple times over the last year for various somatic complaints which I believe are related to anxiety.  I have told this to her on multiple occasions.  3 months ago I started her on Zoloft  for anxiety.  At her last visit, she told me she felt things were better.  However today she wants to discontinue the Zoloft  because she believes it is causing her to have memory problems, vision problems, etc.  She describes problems as yellow spots in her field of vision.  She describes the memory problems in a fog.  She states that she has a hard time remembering what she wants to say at times.  She also states that last night she woke up and she was reaching and making odd motions with her hand like she was hallucinating although she denies any specific hallucination.  I believe what she describes is more likely sleep associated with limb movements Past Medical History:  Diagnosis Date  . Anxiety   . Arthritis   . Chronic idiopathic constipation   . GERD (gastroesophageal reflux disease)   . HLD (hyperlipidemia)   . Hyperlipidemia   . Osteoporosis   . Prolapse of posterior vaginal wall   . Sciatica   . Vaginal vault 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   . Varicose vein of leg   . Wears glasses    Past Surgical History:  Procedure Laterality Date  . ANTERIOR AND POSTERIOR REPAIR WITH SACROSPINOUS FIXATION N/A 07/19/2022   Procedure: POSTERIOR REPAIR WITHP PERINEORRHAPHY AND  SACROSPINOUS FIXATION;  Surgeon: Marilynne Rosaline SAILOR, MD;  Location: [facility];  Service: Gynecology;  Laterality: N/A;  Total time requested is 1.5hrs  . BREAST CYST EXCISION Left   . CHOLECYSTECTOMY    . CHOLECYSTECTOMY, LAPAROSCOPIC  2010  . COLONOSCOPY WITH PROPOFOL  N/A 03/22/2023   Procedure: COLONOSCOPY WITH PROPOFOL ;  Surgeon: Eartha Angelia Sieving, MD;  [facility]: [facility];  Service:  Gastroenterology;  Laterality: N/A;  8:45AM;ASA 1  . GLUTEUS MINIMUS REPAIR Left 01/24/2024   Procedure: REPAIR, TENDON, GLUTEUS MINIMUS;  Surgeon: Genelle Standing, MD;  Location: [facility];  Service: Orthopedics;  Laterality: Left;  LEFT GLUTEUS MAXIMUS TENDON TRANSFER  . TOTAL ABDOMINAL HYSTERECTOMY W/ BILATERAL SALPINGOOPHORECTOMY     1990s   Current Outpatient Medications on File Prior to Visit  Medication Sig Dispense Refill  . atorvastatin  (LIPITOR) 40 MG tablet Take 1 tablet (40 mg total) by mouth daily. 90 tablet 2  . Calcium  Carb-Cholecalciferol (CALCIUM /VITAMIN D) 600-400 MG-UNIT TABS Take 2 tablets by mouth daily.    . pantoprazole  (PROTONIX ) 40 MG tablet Take 1 tablet (40 mg total) by mouth daily. 90 tablet 2  . psyllium (REGULOID) 0.52 g capsule Take 0.52 g by mouth daily.    . sertraline  (ZOLOFT ) 50 MG tablet Take 1 tablet (50 mg total) by mouth daily. 30 tablet 3  . clonazePAM  (KLONOPIN ) 0.5 MG tablet Take 1 tablet (0.5 mg total) by mouth 2 (two) times daily as needed for anxiety. (Patient not taking: Reported on 03/15/2024) 20 tablet 1  . oxybutynin  (DITROPAN  XL) 5 MG 24 hr tablet Take 1 tablet (5 mg total) by mouth at bedtime. (Patient not taking: Reported on 03/15/2024) 30 tablet 11  . sulfamethoxazole -trimethoprim  (BACTRIM  DS) 800-160 MG tablet Take 1 tablet by mouth 2 (two) times daily. (Patient not taking: Reported on  03/15/2024) 14 tablet 0   No current facility-administered medications on file prior to visit.   No Known Allergies Social History   Socioeconomic History  . Marital status: Married    Spouse name: Not on file  . Number of children: 2  . Years of education: Not on file  . Highest education level: Not on file  Occupational History  . Not on file  Tobacco Use  . Smoking status: Never  . Smokeless tobacco: Never  Vaping Use  . Vaping status: Never Used  Substance and Sexual Activity  . Alcohol use: No  . Drug use: Never  . Sexual  activity: Not Currently    Birth control/protection: Surgical    Comment: hyst  Other Topics Concern  . Not on file  Social History Narrative  . Not on file   Social Drivers of Health   Financial Resource Strain: Low Risk  (11/24/2023)   Overall Financial Resource Strain (CARDIA)   . Difficulty of Paying Living Expenses: Not hard at all  Food Insecurity: No Food Insecurity (11/24/2023)   Hunger Vital Sign   . Worried About Programme researcher, broadcasting/film/video in the Last Year: Never true   . Ran Out of Food in the Last Year: Never true  Transportation Needs: No Transportation Needs (11/24/2023)   PRAPARE - Transportation   . Lack of Transportation (Medical): No   . Lack of Transportation (Non-Medical): No  Physical Activity: Inactive (11/24/2023)   Exercise Vital Sign   . Days of Exercise per Week: 0 days   . Minutes of Exercise per Session: 0 min  Stress: No Stress Concern Present (11/24/2023)   [employer] of Occupational Health - Occupational Stress Questionnaire   . Feeling of Stress: Not at all  Social Connections: Moderately Isolated (11/24/2023)   Social Connection and Isolation Panel   . Frequency of Communication with Friends and Family: Three times a week   . Frequency of Social Gatherings with Friends and Family: More than three times a week   . Attends Religious Services: Never   . Active Member of Clubs or Organizations: No   . Attends Banker Meetings: Never   . Marital Status: Married  Catering manager Violence: Not At Risk (11/24/2023)   Humiliation, Afraid, Rape, and Kick questionnaire   . Fear of Current or Ex-Partner: No   . Emotionally Abused: No   . Physically Abused: No   . Sexually Abused: No     Review of Systems  All other systems reviewed and are negative.      Objective:   Physical Exam Constitutional:      General: She is not in acute distress.    Appearance: Normal appearance. She is well-developed and normal weight. She is not  ill-appearing or toxic-appearing.  Cardiovascular:     Rate and Rhythm: Normal rate and regular rhythm.     Heart sounds: Normal heart sounds.  Pulmonary:     Effort: Pulmonary effort is normal. No respiratory distress.     Breath sounds: No wheezing or rales.  Neurological:     Mental Status: She is alert and oriented to person, place, and time.     Cranial Nerves: No dysarthria or facial asymmetry.     Motor: No tremor, atrophy, abnormal muscle tone or seizure activity.     Coordination: Romberg sign negative. Finger-Nose-Finger Test normal.  Psychiatric:        Mood and Affect: Mood normal.        Behavior:  Behavior normal.        Thought Content: Thought content normal.        Judgment: Judgment normal.           Anxiety I explained to the patient that I feel anxiety is her biggest issue.  I do not think it is wise to stop Zoloft .  However the patient insist.  Decrease Zoloft  to 25 mg daily for 1 week and then 25 mg every other day for 1 week.  Recommended that she see a therapist which she declines at the present time

## 2024-03-20 ENCOUNTER — Encounter (HOSPITAL_BASED_OUTPATIENT_CLINIC_OR_DEPARTMENT_OTHER): Payer: Self-pay | Admitting: Physical Therapy

## 2024-03-20 ENCOUNTER — Ambulatory Visit (HOSPITAL_BASED_OUTPATIENT_CLINIC_OR_DEPARTMENT_OTHER): Admitting: Physical Therapy

## 2024-03-20 DIAGNOSIS — R2689 Other abnormalities of gait and mobility: Secondary | ICD-10-CM

## 2024-03-20 DIAGNOSIS — M25652 Stiffness of left hip, not elsewhere classified: Secondary | ICD-10-CM

## 2024-03-20 DIAGNOSIS — M6281 Muscle weakness (generalized): Secondary | ICD-10-CM

## 2024-03-20 DIAGNOSIS — M25552 Pain in left hip: Secondary | ICD-10-CM

## 2024-03-20 NOTE — Therapy (Addendum)
 OUTPATIENT PHYSICAL THERAPY TREATMENT      Patient Name: Taylor Singleton MRN: [id number] DOB:06/26/1955, 68 y.o., female Today's Date: 03/20/2024  END OF SESSION:  PT End of Session - 03/20/24 1146     Visit Number 12    Number of Visits 24    Date for Recertification  04/19/24    Authorization Type [employer] MEDICARE    Progress Note Due on Visit 20    PT Start Time 1147    PT Stop Time 1230    PT Time Calculation (min) 43 min    Activity Tolerance Patient tolerated treatment well    Behavior During Therapy WFL for tasks assessed/performed         Past Medical History:  Diagnosis Date   Anxiety    Arthritis    Chronic idiopathic constipation    GERD (gastroesophageal reflux disease)    HLD (hyperlipidemia)    Hyperlipidemia    Osteoporosis    Prolapse of posterior vaginal wall    Sciatica    Vaginal vault 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    Varicose vein of leg    Wears glasses    Past Surgical History:  Procedure Laterality Date   ANTERIOR AND POSTERIOR REPAIR WITH SACROSPINOUS FIXATION N/A 07/19/2022   Procedure: POSTERIOR REPAIR WITHP PERINEORRHAPHY AND  SACROSPINOUS FIXATION;  Surgeon: Marilynne Rosaline SAILOR, MD;  Location: [facility];  Service: Gynecology;  Laterality: N/A;  Total time requested is 1.5hrs   BREAST CYST EXCISION Left    CHOLECYSTECTOMY     CHOLECYSTECTOMY, LAPAROSCOPIC  2010   COLONOSCOPY WITH PROPOFOL  N/A 03/22/2023   Procedure: COLONOSCOPY WITH PROPOFOL ;  Surgeon: Eartha Angelia Sieving, MD;  Location: [facility];  Service: Gastroenterology;  Laterality: N/A;  8:45AM;ASA 1   GLUTEUS MINIMUS REPAIR Left 01/24/2024   Procedure: REPAIR, TENDON, GLUTEUS MINIMUS;  Surgeon: Genelle Standing, MD;  Location: [facility];  Service: Orthopedics;  Laterality: Left;  LEFT GLUTEUS MAXIMUS TENDON TRANSFER   TOTAL ABDOMINAL HYSTERECTOMY W/ BILATERAL SALPINGOOPHORECTOMY     1990s   Patient Active Problem List   Diagnosis Date Noted    Tear of left gluteus medius tendon 01/24/2024   Candidiasis, intertrigo 08/26/2023   Seborrheic keratoses 08/26/2023   Greater trochanteric pain syndrome 05/30/2023   Acute cystitis without hematuria 05/30/2023   Encounter for colorectal cancer screening 03/22/2023   Nausea without vomiting 03/17/2023   Flatulence 03/17/2023   S/P hysterectomy 10/06/2022   Urinary frequency 10/06/2022   Vaginal burning 10/06/2022   Vaginal irritation 10/06/2022   Vulvar irritation 10/06/2022   Sebaceous cyst of labia 10/06/2022   BPPV (benign paroxysmal positional vertigo), left 01/29/2022   Laryngopharyngeal reflux (LPR) 01/29/2022   Pharyngoesophageal dysphagia 01/29/2022   HLD (hyperlipidemia)     PCP: Duanne Butler DASEN, MD  REFERRING PROVIDER: Genelle Standing, MD  REFERRING DIAG: [phone] (ICD-10-CM) - Pain in left hip 1. Left hip gluteus maximus tendon transfer 2. Left hip trochanteric bursectomy; DOS 01/24/24  THERAPY DIAG:  Pain in left hip  Muscle weakness (generalized)  Stiffness of left hip, not elsewhere classified  Other abnormalities of gait and mobility  Rationale for Evaluation and Treatment: Rehabilitation  ONSET DATE: DOS 01/24/24  SUBJECTIVE:   SUBJECTIVE STATEMENT: Reports she had a good day yesterday but she is struggling today. Woke up las night with stiffness in L hip.  PERTINENT HISTORY: HLD, osteoporosis PAIN:  Are you having pain? no: NPRS scale: 2-3/10 Pain location: L hip Pain description: just stiff  Aggravating  factors: walking Relieving factors: rest  PRECAUTIONS: Other: Left hip gluteus maximus tendon transfer  WEIGHT BEARING RESTRICTIONS: WBAT  FALLS:  Has patient fallen in last 6 months? No  PLOF: Independent  PATIENT GOALS: be able to function normal again  OBJECTIVE: (objective measures from initial evaluation unless otherwise dated)  PATIENT SURVEYS:  LEFS  Extreme difficulty/unable (0), Quite a bit of difficulty (1), Moderate  difficulty (2), Little difficulty (3), No difficulty (4) Survey date:  01/26/24 03/13/24  Any of your usual work, housework or school activities 3 2  2. Usual hobbies, recreational or sporting activities 3 3  3. Getting into/out of the bath 0 4  4. Walking between rooms 4 3  5. Putting on socks/shoes 4 3  6. Squatting  4 4  7. Lifting an object, like a bag of groceries from the floor 4 4  8. Performing light activities around your home 3 4  9. Performing heavy activities around your home 3 2  10. Getting into/out of a car 4 4  11. Walking 2 blocks 3 2  12. Walking 1 mile 1 0  13. Going up/down 10 stairs (1 flight) 4 3  14. Standing for 1 hour 3 0  15.  sitting for 1 hour 4 2  16. Running on even ground 1 0  17. Running on uneven ground 1 0  18. Making sharp turns while running fast 1 0  19. Hopping  1 0  20. Rolling over in bed 3 4  Score total:  54/80 44/80     COGNITION: Overall cognitive status: Within functional limits for tasks assessed     SENSATION: WFL   POSTURE: rounded shoulders and forward head  PALPATION: Grossly tender lateral L hip  LOWER EXTREMITY ROM:  Passive ROM Right eval Left eval Left  9/30  Hip flexion  > 90 108 active supine  Hip extension     Hip abduction   20deg passive  Hip adduction     Hip internal rotation     Hip external rotation     Knee flexion     Knee extension     Ankle dorsiflexion     Ankle plantarflexion     Ankle inversion     Ankle eversion      (Blank rows = not tested) *= pain/symptoms  LOWER EXTREMITY MMT:  MMT Right eval Left eval Left  9/30  Hip flexion   5  Hip extension     Hip abduction     Hip adduction     Hip internal rotation     Hip external rotation     Knee flexion 5 5 5   Knee extension 5 4+ 4+  Ankle dorsiflexion     Ankle plantarflexion     Ankle inversion     Ankle eversion      (Blank rows = not tested) *= pain/symptoms   FUNCTIONAL TESTS:  NT Transfer: labored with UE  support  GAIT: Distance walked: 100 feet Assistive device utilized: rollator Level of assistance: Modified independence Comments: slightly flexed trunk with rollator, antalgic on L    TODAY'S TREATMENT:  DATE:  03/20/24 SciFit bike L2.5 x5 min Mini squats with hands on rail 3x10 with airex pad under R foot  Lateral walks with GTB x6 laps  Forward step ups 6 inch box 3x10  03/15/24 Sci fit bike L2 x51min Bridges x20 Sidelying clam 2x10 LAQ 2# 5 2x10 Sit to stands 2x10 (cues for trunk flexion) Step ups fwd 4 2x10 Side stepping 3x 69ft Stepping over single hurdle x5  03/13/24 Sci fit bike L2 x12min Updated MMT and ROM Goal review Bridges x20 Sidelying clam 2x10 LAQ 2# 5 2x10 Sit to stands x10 Step ups fwd 4 2x10   03/08/24 Scifit L2, 5 min UE on rails: hip abdct to toe touch 3x5;  hip extension to toe tap 3x5 - each LE; Bil calf raises x 10 Lt forward step up 2 x 10 with RUE on rail; repeated on 4 x 10 Lt lateral step up 2 x 10 with BUE on rail; repeated on 4 x 10 Side stepping R/L with UE on rails Seated quad stretch with foot under chair, sliding buttocks forward 20sec x 2 LLE, x 1 RLE On Airex pad: slow marching, intermittent UE on rails; narrow BOS eyes open, eyes closed x 10sec; semi tandem stance x 15sec each Gait:  25 ft x 5, with SPC cues for increasing Rt step height (to avoid shuffle),even stance time, Lt arm swing.   03/06/24 Scifit L1-2, 6 min LAQ 3# 5 2x10 Standing weight shifts on scales  Squats with light UE support Bil calf raises x 10 Standing high knee marching with UE on rails Side stepping R/L with UE on rails Trial of hip rotation with Lt lower leg on stool; limited UE on rails: Lt hip abdct to toe touch x 10; Lt hip extension to toe tap x 10 Prone Lt knee flexion with hip IR/ER, range to tolerance  02/27/24 Bike 5  minutes for hip mobility L2 PROM L hip within protocol limits Gait with SPC- x144ft. Cues for increased step length Hooklying hip abduction isometric with belt 15 x 5-10 second holds LAQ 2# 5 2x10 Partial bridges 2x10 Hooklying ball squeeze 2x10 5 hold Standing HR 2x10 Standing hip extension 2x10   PATIENT EDUCATION:  Education details: exercise progression/ modification Person educated: Patient Education method: Programmer, multimedia, Demonstration, Education comprehension: verbalized understanding, returned demonstration, verbal cues required, and tactile cues required  HOME EXERCISE PROGRAM: Access Code: [email or web address] URL: [email or web address] Updated 03/06/24  ASSESSMENT:  CLINICAL IMPRESSION: Patient tolerated exercises well today with no significant increase in pain. Therapy focused on quad and glute strengthening to improve activity tolerance. Verbal cueing throughout for improved body mechanics to increase glute activation. Educated on expected soreness amd ambulation with cane when fatigued. Updated HEP with monster walks and lateral steps with bands. Will continue to improve from therapy to address remaining limitations and return to prior level of function.   OBJECTIVE IMPAIRMENTS: Abnormal gait, decreased activity tolerance, decreased balance, decreased endurance, decreased mobility, difficulty walking, decreased ROM, decreased strength, increased muscle spasms, impaired flexibility, improper body mechanics, and pain  ACTIVITY LIMITATIONS: lifting, bending, sitting, standing, squatting, sleeping, stairs, transfers, locomotion level, and caring for others  PARTICIPATION LIMITATIONS: meal prep, cleaning, laundry, shopping, community activity, occupation, and yard work  PERSONAL FACTORS: Time since onset of injury/illness/exacerbation and 1-2 comorbidities: HLD, osteoporosis are also affecting patient's functional outcome.   REHAB POTENTIAL: Good  CLINICAL DECISION  MAKING: Evolving/moderate complexity  EVALUATION COMPLEXITY: Moderate   GOALS: Goals reviewed with patient? Yes  SHORT TERM GOALS:  Target date: 03/08/2024    Patient will be independent with HEP in order to improve functional outcomes. Baseline: Goal status: MET 03/13/24  2.  Patient will report at least 25% improvement in symptoms for improved quality of life. Baseline: Goal status: MET - 03/06/24  3.  Pain free flexion to 90 Baseline:  Goal status:MET   4.  SLS 30s without compensation Baseline: (will trial closer to 8wks per protocol) Goal status: INITIAL   LONG TERM GOALS: Target date: 04/19/2024    Patient will report at least 75% improvement in symptoms for improved quality of life. Baseline:  Goal status: IN PROGRESS  2.  Patient will improve LEFS score by at least 15 points in order to indicate improved tolerance to activity. Baseline:  Goal status: NOT MET 9/30  3.  Patient will be able to navigate stairs with reciprocal pattern without compensation in order to demonstrate improved LE strength. Baseline:  Goal status: IN PROGRESS  4.  Patient will demonstrate MMT 80% of opposite lower extremity in all tested musculature as evidence of improved strength to assist with stair ambulation and gait. Baseline:  Goal status: IN PROGRESS 03/13/24  5.  Patient will be able to return to all activities unrestricted for improved ability to perform work functions and participate with family.  Baseline:  Goal status: IN PROGRESS    PLAN:  PT FREQUENCY: 1-2x/week  PT DURATION: 12 weeks  PLANNED INTERVENTIONS: [id number]- PT Re-evaluation, 97110-Therapeutic exercises, 97530- Therapeutic activity, [id number]- Neuromuscular re-education, 97535- Self Care, [id number]- Manual therapy, [phone]- Gait training, [phone]- Orthotic Fit/training, [phone]- Canalith repositioning, [id number]- Aquatic Therapy, [phone]- Splinting, [phone]- Wound care (first 20 sq cm), 97598- Wound care (each additional 20 sq  cm)Patient/Family education, Balance training, Stair training, Taping, Dry Needling, Joint mobilization, Joint manipulation, Spinal manipulation, Spinal mobilization, Scar mobilization, and DME instructions.  PLAN FOR NEXT SESSION: progress with glute tendon transfer protocol  Lili Finder, Student-PT 03/20/2024, 12:31 PM   This entire session was performed under direct supervision and direction of a licensed therapist/therapist assistant . I have personally read, edited and approve of the note as written. 12:43 PM, 03/20/24 Prentice CANDIE Stains PT, DPT Physical Therapist at [facility]

## 2024-03-23 ENCOUNTER — Encounter (HOSPITAL_BASED_OUTPATIENT_CLINIC_OR_DEPARTMENT_OTHER): Payer: Self-pay | Admitting: Physical Therapy

## 2024-03-23 ENCOUNTER — Ambulatory Visit (HOSPITAL_BASED_OUTPATIENT_CLINIC_OR_DEPARTMENT_OTHER): Admitting: Physical Therapy

## 2024-03-23 DIAGNOSIS — M25552 Pain in left hip: Secondary | ICD-10-CM

## 2024-03-23 DIAGNOSIS — R2689 Other abnormalities of gait and mobility: Secondary | ICD-10-CM

## 2024-03-23 DIAGNOSIS — M25652 Stiffness of left hip, not elsewhere classified: Secondary | ICD-10-CM | POA: Diagnosis not present

## 2024-03-23 DIAGNOSIS — M6281 Muscle weakness (generalized): Secondary | ICD-10-CM

## 2024-03-23 NOTE — Therapy (Addendum)
 OUTPATIENT PHYSICAL THERAPY TREATMENT      Patient Name: Taylor Singleton MRN: [id number] DOB:[birth year], 68 y.o., female Today's Date: 03/23/2024  END OF SESSION:  PT End of Session - 03/23/24 1137     Visit Number 13    Number of Visits 24    Date for Recertification  04/19/24    Authorization Type [employer] MEDICARE    Progress Note Due on Visit 20    PT Start Time 1145    PT Stop Time 1229    PT Time Calculation (min) 44 min    Activity Tolerance Patient tolerated treatment well    Behavior During Therapy WFL for tasks assessed/performed         Past Medical History:  Diagnosis Date   Anxiety    Arthritis    Chronic idiopathic constipation    GERD (gastroesophageal reflux disease)    HLD (hyperlipidemia)    Hyperlipidemia    Osteoporosis    Prolapse of posterior vaginal wall    Sciatica    Vaginal vault 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    Varicose vein of leg    Wears glasses    Past Surgical History:  Procedure Laterality Date   ANTERIOR AND POSTERIOR REPAIR WITH SACROSPINOUS FIXATION N/A 07/19/2022   Procedure: POSTERIOR REPAIR WITHP PERINEORRHAPHY AND  SACROSPINOUS FIXATION;  Surgeon: Marilynne Rosaline SAILOR, MD;  Location: [facility] [place];  Service: Gynecology;  Laterality: N/A;  Total time requested is 1.5hrs   BREAST CYST EXCISION Left    CHOLECYSTECTOMY     CHOLECYSTECTOMY, LAPAROSCOPIC  2010   COLONOSCOPY WITH PROPOFOL  N/A 03/22/2023   Procedure: COLONOSCOPY WITH PROPOFOL ;  Surgeon: Eartha Angelia Sieving, MD;  Location: [facility];  Service: Gastroenterology;  Laterality: N/A;  8:45AM;ASA 1   GLUTEUS MINIMUS REPAIR Left 01/24/2024   Procedure: REPAIR, TENDON, GLUTEUS MINIMUS;  Surgeon: Genelle Standing, MD;  Location: [facility];  Service: Orthopedics;  Laterality: Left;  LEFT GLUTEUS MAXIMUS TENDON TRANSFER   TOTAL ABDOMINAL HYSTERECTOMY W/ BILATERAL SALPINGOOPHORECTOMY     1990s   Patient Active Problem List   Diagnosis Date Noted    Tear of left gluteus medius tendon 01/24/2024   Candidiasis, intertrigo 08/26/2023   Seborrheic keratoses 08/26/2023   Greater trochanteric pain syndrome 05/30/2023   Acute cystitis without hematuria 05/30/2023   Encounter for colorectal cancer screening 03/22/2023   Nausea without vomiting 03/17/2023   Flatulence 03/17/2023   S/P hysterectomy 10/06/2022   Urinary frequency 10/06/2022   Vaginal burning 10/06/2022   Vaginal irritation 10/06/2022   Vulvar irritation 10/06/2022   Sebaceous cyst of labia 10/06/2022   BPPV (benign paroxysmal positional vertigo), left 01/29/2022   Laryngopharyngeal reflux (LPR) 01/29/2022   Pharyngoesophageal dysphagia 01/29/2022   HLD (hyperlipidemia)    PCP: Duanne Butler DASEN, MD  REFERRING PROVIDER: Genelle Standing, MD  REFERRING DIAG: [phone] (ICD-10-CM) - Pain in left hip 1. Left hip gluteus maximus tendon transfer 2. Left hip trochanteric bursectomy; DOS 01/24/24  THERAPY DIAG:  Pain in left hip  Muscle weakness (generalized)  Stiffness of left hip, not elsewhere classified  Other abnormalities of gait and mobility  Rationale for Evaluation and Treatment: Rehabilitation  ONSET DATE: DOS 01/24/24  SUBJECTIVE:   SUBJECTIVE STATEMENT: Reports that she felt really good after previous session. Feels good today.   PERTINENT HISTORY: HLD, osteoporosis PAIN:  Are you having pain? no: NPRS scale: 2-3/10 Pain location: L hip Pain description: just stiff  Aggravating factors: walking Relieving factors: rest  PRECAUTIONS: Other: Left  hip gluteus maximus tendon transfer  WEIGHT BEARING RESTRICTIONS: WBAT  FALLS:  Has patient fallen in last 6 months? No  PLOF: Independent  PATIENT GOALS: be able to function normal again  OBJECTIVE: (objective measures from initial evaluation unless otherwise dated)  PATIENT SURVEYS:  LEFS  Extreme difficulty/unable (0), Quite a bit of difficulty (1), Moderate difficulty (2), Little difficulty  (3), No difficulty (4) Survey date:  01/26/24 03/13/24  Any of your usual work, housework or school activities 3 2  2. Usual hobbies, recreational or sporting activities 3 3  3. Getting into/out of the bath 0 4  4. Walking between rooms 4 3  5. Putting on socks/shoes 4 3  6. Squatting  4 4  7. Lifting an object, like a bag of groceries from the floor 4 4  8. Performing light activities around your home 3 4  9. Performing heavy activities around your home 3 2  10. Getting into/out of a car 4 4  11. Walking 2 blocks 3 2  12. Walking 1 mile 1 0  13. Going up/down 10 stairs (1 flight) 4 3  14. Standing for 1 hour 3 0  15.  sitting for 1 hour 4 2  16. Running on even ground 1 0  17. Running on uneven ground 1 0  18. Making sharp turns while running fast 1 0  19. Hopping  1 0  20. Rolling over in bed 3 4  Score total:  54/80 44/80     COGNITION: Overall cognitive status: Within functional limits for tasks assessed     SENSATION: WFL   POSTURE: rounded shoulders and forward head  PALPATION: Grossly tender lateral L hip  LOWER EXTREMITY ROM:  Passive ROM Right eval Left eval Left  9/30  Hip flexion  > 90 108 active supine  Hip extension     Hip abduction   20deg passive  Hip adduction     Hip internal rotation     Hip external rotation     Knee flexion     Knee extension     Ankle dorsiflexion     Ankle plantarflexion     Ankle inversion     Ankle eversion      (Blank rows = not tested) *= pain/symptoms  LOWER EXTREMITY MMT:  MMT Right eval Left eval Left  9/30  Hip flexion   5  Hip extension     Hip abduction     Hip adduction     Hip internal rotation     Hip external rotation     Knee flexion 5 5 5   Knee extension 5 4+ 4+  Ankle dorsiflexion     Ankle plantarflexion     Ankle inversion     Ankle eversion      (Blank rows = not tested) *= pain/symptoms   FUNCTIONAL TESTS:  NT Transfer: labored with UE support  GAIT: Distance walked: 100  feet Assistive device utilized: rollator Level of assistance: Modified independence Comments: slightly flexed trunk with rollator, antalgic on L    TODAY'S TREATMENT:  DATE:  03/23/24 SciFit bike x5 Mini squats with BTB around knees 2x10 Lateral walks with BTB x6 laps on rail  Step ups 6 inch box 2x10 Cybex leg press 3x10 40# RDL x10 holding cane   03/20/24 SciFit bike L2.5 x5 min Mini squats with hands on rail 3x10 with airex pad under R foot  Lateral walks with GTB x6 laps  Forward step ups 6 inch box 3x10  03/15/24 Sci fit bike L2 x46min Bridges x20 Sidelying clam 2x10 LAQ 2# 5 2x10 Sit to stands 2x10 (cues for trunk flexion) Step ups fwd 4 2x10 Side stepping 3x 9ft Stepping over single hurdle x5  03/13/24 Sci fit bike L2 x43min Updated MMT and ROM Goal review Bridges x20 Sidelying clam 2x10 LAQ 2# 5 2x10 Sit to stands x10 Step ups fwd 4 2x10   03/08/24 Scifit L2, 5 min UE on rails: hip abdct to toe touch 3x5;  hip extension to toe tap 3x5 - each LE; Bil calf raises x 10 Lt forward step up 2 x 10 with RUE on rail; repeated on 4 x 10 Lt lateral step up 2 x 10 with BUE on rail; repeated on 4 x 10 Side stepping R/L with UE on rails Seated quad stretch with foot under chair, sliding buttocks forward 20sec x 2 LLE, x 1 RLE On Airex pad: slow marching, intermittent UE on rails; narrow BOS eyes open, eyes closed x 10sec; semi tandem stance x 15sec each Gait:  25 ft x 5, with SPC cues for increasing Rt step height (to avoid shuffle),even stance time, Lt arm swing.   03/06/24 Scifit L1-2, 6 min LAQ 3# 5 2x10 Standing weight shifts on scales  Squats with light UE support Bil calf raises x 10 Standing high knee marching with UE on rails Side stepping R/L with UE on rails Trial of hip rotation with Lt lower leg on stool; limited UE on  rails: Lt hip abdct to toe touch x 10; Lt hip extension to toe tap x 10 Prone Lt knee flexion with hip IR/ER, range to tolerance  02/27/24 Bike 5 minutes for hip mobility L2 PROM L hip within protocol limits Gait with SPC- x136ft. Cues for increased step length Hooklying hip abduction isometric with belt 15 x 5-10 second holds LAQ 2# 5 2x10 Partial bridges 2x10 Hooklying ball squeeze 2x10 5 hold Standing HR 2x10 Standing hip extension 2x10   PATIENT EDUCATION:  Education details: exercise progression/ modification Person educated: Patient Education method: Programmer, multimedia, Demonstration, Education comprehension: verbalized understanding, returned demonstration, verbal cues required, and tactile cues required  HOME EXERCISE PROGRAM: Access Code: [email or web address] URL: [email or web address] Updated 03/06/24  ASSESSMENT:  CLINICAL IMPRESSION: Patient tolerated exercises well today with no significant increase in pain. Therapy focused on progressing quad and glute strengthening to improve activity tolerance. Attempted 8 inch step up but patient compensated with a trendelenburg pattern when stepping up with the left foot. Educated on expected soreness and updated home exercise program. Will continue to benefit from therapy to address remaining limitations and return to prior level of function.   OBJECTIVE IMPAIRMENTS: Abnormal gait, decreased activity tolerance, decreased balance, decreased endurance, decreased mobility, difficulty walking, decreased ROM, decreased strength, increased muscle spasms, impaired flexibility, improper body mechanics, and pain  ACTIVITY LIMITATIONS: lifting, bending, sitting, standing, squatting, sleeping, stairs, transfers, locomotion level, and caring for others  PARTICIPATION LIMITATIONS: meal prep, cleaning, laundry, shopping, community activity, occupation, and yard work  PERSONAL FACTORS: Time since onset of injury/illness/exacerbation and 1-2  comorbidities:  HLD, osteoporosis are also affecting patient's functional outcome.   REHAB POTENTIAL: Good  CLINICAL DECISION MAKING: Evolving/moderate complexity  EVALUATION COMPLEXITY: Moderate   GOALS: Goals reviewed with patient? Yes  SHORT TERM GOALS: Target date: 03/08/2024    Patient will be independent with HEP in order to improve functional outcomes. Baseline: Goal status: MET 03/13/24  2.  Patient will report at least 25% improvement in symptoms for improved quality of life. Baseline: Goal status: MET - 03/06/24  3.  Pain free flexion to 90 Baseline:  Goal status:MET   4.  SLS 30s without compensation Baseline: (will trial closer to 8wks per protocol) Goal status: INITIAL   LONG TERM GOALS: Target date: 04/19/2024    Patient will report at least 75% improvement in symptoms for improved quality of life. Baseline:  Goal status: IN PROGRESS  2.  Patient will improve LEFS score by at least 15 points in order to indicate improved tolerance to activity. Baseline:  Goal status: NOT MET 9/30  3.  Patient will be able to navigate stairs with reciprocal pattern without compensation in order to demonstrate improved LE strength. Baseline:  Goal status: IN PROGRESS  4.  Patient will demonstrate MMT 80% of opposite lower extremity in all tested musculature as evidence of improved strength to assist with stair ambulation and gait. Baseline:  Goal status: IN PROGRESS 03/13/24  5.  Patient will be able to return to all activities unrestricted for improved ability to perform work functions and participate with family.  Baseline:  Goal status: IN PROGRESS   PLAN:  PT FREQUENCY: 1-2x/week  PT DURATION: 12 weeks  PLANNED INTERVENTIONS: [id number]- PT Re-evaluation, 97110-Therapeutic exercises, 97530- Therapeutic activity, [id number]- Neuromuscular re-education, 97535- Self Care, [id number]- Manual therapy, [phone]- Gait training, [phone]- Orthotic Fit/training, [phone]- Canalith  repositioning, [id number]- Aquatic Therapy, [phone]- Splinting, [phone]- Wound care (first 20 sq cm), 97598- Wound care (each additional 20 sq cm)Patient/Family education, Balance training, Stair training, Taping, Dry Needling, Joint mobilization, Joint manipulation, Spinal manipulation, Spinal mobilization, Scar mobilization, and DME instructions.  PLAN FOR NEXT SESSION: progress with glute tendon transfer protocol  Lili Finder, Student-PT 03/23/2024, 12:28 PM   This entire session was performed under direct supervision and direction of a licensed therapist/therapist assistant . I have personally read, edited and approve of the note as written. 12:35 PM, 03/23/24 Prentice CANDIE Stains PT, DPT Physical Therapist at [facility]

## 2024-03-27 ENCOUNTER — Encounter (HOSPITAL_BASED_OUTPATIENT_CLINIC_OR_DEPARTMENT_OTHER): Payer: Self-pay | Admitting: Physical Therapy

## 2024-03-27 ENCOUNTER — Ambulatory Visit (HOSPITAL_BASED_OUTPATIENT_CLINIC_OR_DEPARTMENT_OTHER): Admitting: Physical Therapy

## 2024-03-27 DIAGNOSIS — M6281 Muscle weakness (generalized): Secondary | ICD-10-CM

## 2024-03-27 DIAGNOSIS — M25652 Stiffness of left hip, not elsewhere classified: Secondary | ICD-10-CM | POA: Diagnosis not present

## 2024-03-27 DIAGNOSIS — R2689 Other abnormalities of gait and mobility: Secondary | ICD-10-CM | POA: Diagnosis not present

## 2024-03-27 DIAGNOSIS — M25552 Pain in left hip: Secondary | ICD-10-CM | POA: Diagnosis not present

## 2024-03-27 NOTE — Therapy (Addendum)
 OUTPATIENT PHYSICAL THERAPY TREATMENT      Patient Name: Taylor Singleton MRN: [id number] DOB:1955-08-11, 68 y.o., female Today's Date: 03/27/2024  END OF SESSION:  PT End of Session - 03/27/24 1109     Visit Number 14    Number of Visits 24    Date for Recertification  04/19/24    Authorization Type [employer]BS MEDICARE    Progress Note Due on Visit 20    PT Start Time 1109    PT Stop Time 1145    PT Time Calculation (min) 36 min    Activity Tolerance Patient tolerated treatment well    Behavior During Therapy WFL for tasks assessed/performed         Past Medical History:  Diagnosis Date   Anxiety    Arthritis    Chronic idiopathic constipation    GERD (gastroesophageal reflux disease)    HLD (hyperlipidemia)    Hyperlipidemia    Osteoporosis    Prolapse of posterior vaginal wall    Sciatica    Vaginal vault 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    Varicose vein of leg    Wears glasses    Past Surgical History:  Procedure Laterality Date   ANTERIOR AND POSTERIOR REPAIR WITH SACROSPINOUS FIXATION N/A 07/19/2022   Procedure: POSTERIOR REPAIR WITHP PERINEORRHAPHY AND  SACROSPINOUS FIXATION;  Surgeon: Marilynne Rosaline SAILOR, MD;  Location: [facility];  Service: Gynecology;  Laterality: N/A;  Total time requested is 1.5hrs   BREAST CYST EXCISION Left    CHOLECYSTECTOMY     CHOLECYSTECTOMY, LAPAROSCOPIC  2010   COLONOSCOPY WITH PROPOFOL  N/A 03/22/2023   Procedure: COLONOSCOPY WITH PROPOFOL ;  Surgeon: Eartha Angelia Sieving, MD;  Location: [facility];  Service: Gastroenterology;  Laterality: N/A;  8:45AM;ASA 1   GLUTEUS MINIMUS REPAIR Left 01/24/2024   Procedure: REPAIR, TENDON, GLUTEUS MINIMUS;  Surgeon: Genelle Standing, MD;  Location: [facility];  Service: Orthopedics;  Laterality: Left;  LEFT GLUTEUS MAXIMUS TENDON TRANSFER   TOTAL ABDOMINAL HYSTERECTOMY W/ BILATERAL SALPINGOOPHORECTOMY     1990s   Patient Active Problem List   Diagnosis Date Noted    Tear of left gluteus medius tendon 01/24/2024   Candidiasis, intertrigo 08/26/2023   Seborrheic keratoses 08/26/2023   Greater trochanteric pain syndrome 05/30/2023   Acute cystitis without hematuria 05/30/2023   Encounter for colorectal cancer screening 03/22/2023   Nausea without vomiting 03/17/2023   Flatulence 03/17/2023   S/P hysterectomy 10/06/2022   Urinary frequency 10/06/2022   Vaginal burning 10/06/2022   Vaginal irritation 10/06/2022   Vulvar irritation 10/06/2022   Sebaceous cyst of labia 10/06/2022   BPPV (benign paroxysmal positional vertigo), left 01/29/2022   Laryngopharyngeal reflux (LPR) 01/29/2022   Pharyngoesophageal dysphagia 01/29/2022   HLD (hyperlipidemia)    PCP: Duanne Butler DASEN, MD  REFERRING PROVIDER: Genelle Standing, MD  REFERRING DIAG: [phone] (ICD-10-CM) - Pain in left hip 1. Left hip gluteus maximus tendon transfer 2. Left hip trochanteric bursectomy; DOS 01/24/24  THERAPY DIAG:  Pain in left hip  Muscle weakness (generalized)  Stiffness of left hip, not elsewhere classified  Other abnormalities of gait and mobility  Rationale for Evaluation and Treatment: Rehabilitation  ONSET DATE: DOS 01/24/24  SUBJECTIVE:   SUBJECTIVE STATEMENT: Reports that she felt sore after last session. Was sick with cold over weekend but she is feeling better now.   PERTINENT HISTORY: HLD, osteoporosis PAIN:  Are you having pain? no: NPRS scale: 2-3/10 Pain location: L hip Pain description: just stiff  Aggravating factors:  walking Relieving factors: rest  PRECAUTIONS: Other: Left hip gluteus maximus tendon transfer  WEIGHT BEARING RESTRICTIONS: WBAT  FALLS:  Has patient fallen in last 6 months? No  PLOF: Independent  PATIENT GOALS: be able to function normal again  OBJECTIVE: (objective measures from initial evaluation unless otherwise dated)  PATIENT SURVEYS:  LEFS  Extreme difficulty/unable (0), Quite a bit of difficulty (1), Moderate  difficulty (2), Little difficulty (3), No difficulty (4) Survey date:  01/26/24 03/13/24  Any of your usual work, housework or school activities 3 2  2. Usual hobbies, recreational or sporting activities 3 3  3. Getting into/out of the bath 0 4  4. Walking between rooms 4 3  5. Putting on socks/shoes 4 3  6. Squatting  4 4  7. Lifting an object, like a bag of groceries from the floor 4 4  8. Performing light activities around your home 3 4  9. Performing heavy activities around your home 3 2  10. Getting into/out of a car 4 4  11. Walking 2 blocks 3 2  12. Walking 1 mile 1 0  13. Going up/down 10 stairs (1 flight) 4 3  14. Standing for 1 hour 3 0  15.  sitting for 1 hour 4 2  16. Running on even ground 1 0  17. Running on uneven ground 1 0  18. Making sharp turns while running fast 1 0  19. Hopping  1 0  20. Rolling over in bed 3 4  Score total:  54/80 44/80     COGNITION: Overall cognitive status: Within functional limits for tasks assessed     SENSATION: WFL   POSTURE: rounded shoulders and forward head  PALPATION: Grossly tender lateral L hip  LOWER EXTREMITY ROM:  Passive ROM Right eval Left eval Left  9/30  Hip flexion  > 90 108 active supine  Hip extension     Hip abduction   20deg passive  Hip adduction     Hip internal rotation     Hip external rotation     Knee flexion     Knee extension     Ankle dorsiflexion     Ankle plantarflexion     Ankle inversion     Ankle eversion      (Blank rows = not tested) *= pain/symptoms  LOWER EXTREMITY MMT:  MMT Right eval Left eval Left  9/30  Hip flexion   5  Hip extension     Hip abduction     Hip adduction     Hip internal rotation     Hip external rotation     Knee flexion 5 5 5   Knee extension 5 4+ 4+  Ankle dorsiflexion     Ankle plantarflexion     Ankle inversion     Ankle eversion      (Blank rows = not tested) *= pain/symptoms   FUNCTIONAL TESTS:  NT Transfer: labored with UE  support  GAIT: Distance walked: 100 feet Assistive device utilized: rollator Level of assistance: Modified independence Comments: slightly flexed trunk with rollator, antalgic on L    TODAY'S TREATMENT:  DATE:  03/27/24 STM and IASTM to L ITB, piriformis, and glutes  SciFit bike lvl 3 x6 min Leg extension machine 10# 3x10 Squats with airex pad under right foot 3x10 Forward knee bend stretch with LLE elevated on plinth 3x30 seconds   03/23/24 SciFit bike x5 Mini squats with BTB around knees 2x10 Lateral walks with BTB x6 laps on rail  Step ups 6 inch box 2x10 Cybex leg press 3x10 40# RDL x10 holding cane   03/20/24 SciFit bike L2.5 x5 min Mini squats with hands on rail 3x10 with airex pad under R foot  Lateral walks with GTB x6 laps  Forward step ups 6 inch box 3x10  03/15/24 Sci fit bike L2 x34min Bridges x20 Sidelying clam 2x10 LAQ 2# 5 2x10 Sit to stands 2x10 (cues for trunk flexion) Step ups fwd 4 2x10 Side stepping 3x 93ft Stepping over single hurdle x5  03/13/24 Sci fit bike L2 x65min Updated MMT and ROM Goal review Bridges x20 Sidelying clam 2x10 LAQ 2# 5 2x10 Sit to stands x10 Step ups fwd 4 2x10  03/08/24 Scifit L2, 5 min UE on rails: hip abdct to toe touch 3x5;  hip extension to toe tap 3x5 - each LE; Bil calf raises x 10 Lt forward step up 2 x 10 with RUE on rail; repeated on 4 x 10 Lt lateral step up 2 x 10 with BUE on rail; repeated on 4 x 10 Side stepping R/L with UE on rails Seated quad stretch with foot under chair, sliding buttocks forward 20sec x 2 LLE, x 1 RLE On Airex pad: slow marching, intermittent UE on rails; narrow BOS eyes open, eyes closed x 10sec; semi tandem stance x 15sec each Gait:  25 ft x 5, with SPC cues for increasing Rt step height (to avoid shuffle),even stance time, Lt arm swing.    03/06/24 Scifit L1-2, 6 min LAQ 3# 5 2x10 Standing weight shifts on scales  Squats with light UE support Bil calf raises x 10 Standing high knee marching with UE on rails Side stepping R/L with UE on rails Trial of hip rotation with Lt lower leg on stool; limited UE on rails: Lt hip abdct to toe touch x 10; Lt hip extension to toe tap x 10 Prone Lt knee flexion with hip IR/ER, range to tolerance  02/27/24 Bike 5 minutes for hip mobility L2 PROM L hip within protocol limits Gait with SPC- x176ft. Cues for increased step length Hooklying hip abduction isometric with belt 15 x 5-10 second holds LAQ 2# 5 2x10 Partial bridges 2x10 Hooklying ball squeeze 2x10 5 hold Standing HR 2x10 Standing hip extension 2x10   PATIENT EDUCATION:  Education details: exercise progression/ modification Person educated: Patient Education method: Programmer, multimedia, Demonstration, Education comprehension: verbalized understanding, returned demonstration, verbal cues required, and tactile cues required  HOME EXERCISE PROGRAM: Access Code: [email or web address] URL: [email or web address] Updated 03/06/24  ASSESSMENT:  CLINICAL IMPRESSION: Patient tolerated exercises well today with no significant increase in pain. Therapy focused on progressing quad and glute strengthening to improve activity tolerance and manual therapy to decrease hyperactive musculature. Patient verbalized increase pain after IASTM so stretching was utilized and pain decreased. Educated on activity tolerance, relevant anatomy, and HEP. Will continue to benefit from therapy to address remaining limitations and return to prior level.   OBJECTIVE IMPAIRMENTS: Abnormal gait, decreased activity tolerance, decreased balance, decreased endurance, decreased mobility, difficulty walking, decreased ROM, decreased strength, increased muscle spasms, impaired flexibility, improper body mechanics, and pain  ACTIVITY LIMITATIONS:  lifting, bending,  sitting, standing, squatting, sleeping, stairs, transfers, locomotion level, and caring for others  PARTICIPATION LIMITATIONS: meal prep, cleaning, laundry, shopping, community activity, occupation, and yard work  PERSONAL FACTORS: Time since onset of injury/illness/exacerbation and 1-2 comorbidities: HLD, osteoporosis are also affecting patient's functional outcome.   REHAB POTENTIAL: Good  CLINICAL DECISION MAKING: Evolving/moderate complexity  EVALUATION COMPLEXITY: Moderate   GOALS: Goals reviewed with patient? Yes  SHORT TERM GOALS: Target date: 03/08/2024    Patient will be independent with HEP in order to improve functional outcomes. Baseline: Goal status: MET 03/13/24  2.  Patient will report at least 25% improvement in symptoms for improved quality of life. Baseline: Goal status: MET - 03/06/24  3.  Pain free flexion to 90 Baseline:  Goal status:MET   4.  SLS 30s without compensation Baseline: (will trial closer to 8wks per protocol) Goal status: INITIAL   LONG TERM GOALS: Target date: 04/19/2024    Patient will report at least 75% improvement in symptoms for improved quality of life. Baseline:  Goal status: IN PROGRESS  2.  Patient will improve LEFS score by at least 15 points in order to indicate improved tolerance to activity. Baseline:  Goal status: NOT MET 9/30  3.  Patient will be able to navigate stairs with reciprocal pattern without compensation in order to demonstrate improved LE strength. Baseline:  Goal status: IN PROGRESS  4.  Patient will demonstrate MMT 80% of opposite lower extremity in all tested musculature as evidence of improved strength to assist with stair ambulation and gait. Baseline:  Goal status: IN PROGRESS 03/13/24  5.  Patient will be able to return to all activities unrestricted for improved ability to perform work functions and participate with family.  Baseline:  Goal status: IN PROGRESS   PLAN:  PT FREQUENCY:  1-2x/week  PT DURATION: 12 weeks  PLANNED INTERVENTIONS: 97164- PT Re-evaluation, 97110-Therapeutic exercises, 97530- Therapeutic activity, [id number]- Neuromuscular re-education, 97535- Self Care, [id number]- Manual therapy, [phone]- Gait training, [phone]- Orthotic Fit/training, [phone]- Canalith repositioning, [id number]- Aquatic Therapy, [phone]- Splinting, [phone]- Wound care (first 20 sq cm), 97598- Wound care (each additional 20 sq cm)Patient/Family education, Balance training, Stair training, Taping, Dry Needling, Joint mobilization, Joint manipulation, Spinal manipulation, Spinal mobilization, Scar mobilization, and DME instructions.  PLAN FOR NEXT SESSION: progress with glute tendon transfer protocol  Lili Finder, Student-PT 03/27/2024, 11:10 AM   This entire session was performed under direct supervision and direction of a licensed therapist/therapist assistant . I have personally read, edited and approve of the note as written. 12:12 PM, 03/27/24 Prentice CANDIE Stains PT, DPT Physical Therapist at [facility]

## 2024-03-28 ENCOUNTER — Ambulatory Visit (INDEPENDENT_AMBULATORY_CARE_PROVIDER_SITE_OTHER): Admitting: Orthopaedic Surgery

## 2024-03-28 ENCOUNTER — Encounter (INDEPENDENT_AMBULATORY_CARE_PROVIDER_SITE_OTHER): Payer: Self-pay | Admitting: Gastroenterology

## 2024-03-28 DIAGNOSIS — S76012A Strain of muscle, fascia and tendon of left hip, initial encounter: Secondary | ICD-10-CM

## 2024-03-28 NOTE — Progress Notes (Signed)
 Post Operative Evaluation    Procedure/Date of Surgery: Left hip gluteus maximus tendon transfer 8/12  Interval History:    Presents 8 weeks status post the above procedure.  She is continuing to improve slowly although there is still soreness about the lateral aspect of the hip.  She has been having some right foot plantar fasciitis as well   PMH/PSH/Family History/Social History/Meds/Allergies:    Past Medical History:  Diagnosis Date   Anxiety    Arthritis    Chronic idiopathic constipation    GERD (gastroesophageal reflux disease)    HLD (hyperlipidemia)    Hyperlipidemia    Osteoporosis    Prolapse of posterior vaginal wall    Sciatica    Vaginal vault 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    Varicose vein of leg    Wears glasses    Past Surgical History:  Procedure Laterality Date   ANTERIOR AND POSTERIOR REPAIR WITH SACROSPINOUS FIXATION N/A 07/19/2022   Procedure: POSTERIOR REPAIR WITHP PERINEORRHAPHY AND  SACROSPINOUS FIXATION;  Surgeon: Marilynne Rosaline SAILOR, MD;  Location: [facility];  Service: Gynecology;  Laterality: N/A;  Total time requested is 1.5hrs   BREAST CYST EXCISION Left    CHOLECYSTECTOMY     CHOLECYSTECTOMY, LAPAROSCOPIC  2010   COLONOSCOPY WITH PROPOFOL  N/A 03/22/2023   Procedure: COLONOSCOPY WITH PROPOFOL ;  Surgeon: Eartha Angelia Sieving, MD;  [facility]: [facility];  Service: Gastroenterology;  Laterality: N/A;  8:45AM;ASA 1   GLUTEUS MINIMUS REPAIR Left 01/24/2024   Procedure: REPAIR, TENDON, GLUTEUS MINIMUS;  Surgeon: Genelle Standing, MD;  Location: [facility];  Service: Orthopedics;  Laterality: Left;  LEFT GLUTEUS MAXIMUS TENDON TRANSFER   TOTAL ABDOMINAL HYSTERECTOMY W/ BILATERAL SALPINGOOPHORECTOMY     1990s   Social History   Socioeconomic History   Marital status: Married    Spouse name: Not on file   Number of children: 2   Years of education: Not on file   Highest  education level: Not on file  Occupational History   Not on file  Tobacco Use   Smoking status: Never   Smokeless tobacco: Never  Vaping Use   Vaping status: Never Used  Substance and Sexual Activity   Alcohol use: No   Drug use: Never   Sexual activity: Not Currently    Birth control/protection: Surgical    Comment: hyst  Other Topics Concern   Not on file  Social History Narrative   Not on file   Social Drivers of Health   Financial Resource Strain: Low Risk  (11/24/2023)   Overall Financial Resource Strain (CARDIA)    Difficulty of Paying Living Expenses: Not hard at all  Food Insecurity: No Food Insecurity (11/24/2023)   Hunger Vital Sign    Worried About Running Out of Food in the Last Year: Never true    Ran Out of Food in the Last Year: Never true  Transportation Needs: No Transportation Needs (11/24/2023)   PRAPARE - Administrator, Civil Service (Medical): No    Lack of Transportation (Non-Medical): No  Physical Activity: Inactive (11/24/2023)   Exercise Vital Sign    Days of Exercise per Week: 0 days    Minutes of Exercise per Session: 0 min  Stress: No Stress Concern Present (11/24/2023)   [employer] of Occupational Health - Occupational  Stress Questionnaire    Feeling of Stress: Not at all  Social Connections: Moderately Isolated (11/24/2023)   Social Connection and Isolation Panel    Frequency of Communication with Friends and Family: Three times a week    Frequency of Social Gatherings with Friends and Family: More than three times a week    Attends Religious Services: Never    Database administrator or Organizations: No    Attends Engineer, structural: Never    Marital Status: Married   Family History  Problem Relation Age of Onset   Diabetes Mother    Heart attack Brother    Breast cancer Neg Hx    No Known Allergies Current Outpatient Medications  Medication Sig Dispense Refill   atorvastatin  (LIPITOR) 40 MG tablet Take 1  tablet (40 mg total) by mouth daily. 90 tablet 2   Calcium  Carb-Cholecalciferol (CALCIUM /VITAMIN D) 600-400 MG-UNIT TABS Take 2 tablets by mouth daily.     clonazePAM  (KLONOPIN ) 0.5 MG tablet Take 1 tablet (0.5 mg total) by mouth 2 (two) times daily as needed for anxiety. (Patient not taking: Reported on 03/15/2024) 20 tablet 1   oxybutynin  (DITROPAN  XL) 5 MG 24 hr tablet Take 1 tablet (5 mg total) by mouth at bedtime. (Patient not taking: Reported on 03/15/2024) 30 tablet 11   pantoprazole  (PROTONIX ) 40 MG tablet Take 1 tablet (40 mg total) by mouth daily. 90 tablet 2   psyllium (REGULOID) 0.52 g capsule Take 0.52 g by mouth daily.     sertraline  (ZOLOFT ) 50 MG tablet Take 1 tablet (50 mg total) by mouth daily. 30 tablet 3   sulfamethoxazole -trimethoprim  (BACTRIM  DS) 800-160 MG tablet Take 1 tablet by mouth 2 (two) times daily. (Patient not taking: Reported on 03/15/2024) 14 tablet 0   No current facility-administered medications for this visit.   No results found.  Review of Systems:   A ROS was performed including pertinent positives and negatives as documented in the HPI.   Musculoskeletal Exam:    There were no vitals taken for this visit.  Left hip is well-appearing without erythema or drainage.  Incision is well-appearing 30 degrees internal/external rotation of the hip without pain  Imaging:      I personally reviewed and interpreted the radiographs.   Assessment:   Presents today status post 8 weeks status post left hip gluteus maximus tendon transfer continuing to improve.  At this time should continue work on strengthening I will plan to see her back in 8 weeks for reassessment Plan :    - Return to clinic 8 weeks for reassessment      I personally saw and evaluated the patient, and participated in the management and treatment plan.  Elspeth Parker, MD Attending Physician, Orthopedic Surgery  This document was dictated using Dragon voice recognition software. A  reasonable attempt at proof reading has been made to minimize errors.

## 2024-03-30 ENCOUNTER — Encounter (HOSPITAL_BASED_OUTPATIENT_CLINIC_OR_DEPARTMENT_OTHER): Payer: Self-pay | Admitting: Physical Therapy

## 2024-03-30 ENCOUNTER — Ambulatory Visit (HOSPITAL_BASED_OUTPATIENT_CLINIC_OR_DEPARTMENT_OTHER): Admitting: Physical Therapy

## 2024-03-30 DIAGNOSIS — M25652 Stiffness of left hip, not elsewhere classified: Secondary | ICD-10-CM

## 2024-03-30 DIAGNOSIS — R2689 Other abnormalities of gait and mobility: Secondary | ICD-10-CM | POA: Diagnosis not present

## 2024-03-30 DIAGNOSIS — M25552 Pain in left hip: Secondary | ICD-10-CM | POA: Diagnosis not present

## 2024-03-30 DIAGNOSIS — M6281 Muscle weakness (generalized): Secondary | ICD-10-CM

## 2024-03-30 NOTE — Therapy (Addendum)
 OUTPATIENT PHYSICAL THERAPY TREATMENT      Patient Name: Taylor Singleton MRN: [id number] DOB:[birth year], 68 y.o., female Today's Date: 03/30/2024  END OF SESSION:  PT End of Session - 03/30/24 1142     Visit Number 15    Number of Visits 24    Date for Recertification  04/19/24    Authorization Type [employer] MEDICARE    Progress Note Due on Visit 20    PT Start Time 1143    PT Stop Time 1228    PT Time Calculation (min) 45 min    Activity Tolerance Patient tolerated treatment well    Behavior During Therapy WFL for tasks assessed/performed         Past Medical History:  Diagnosis Date   Anxiety    Arthritis    Chronic idiopathic constipation    GERD (gastroesophageal reflux disease)    HLD (hyperlipidemia)    Hyperlipidemia    Osteoporosis    Prolapse of posterior vaginal wall    Sciatica    Vaginal vault 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    Varicose vein of leg    Wears glasses    Past Surgical History:  Procedure Laterality Date   ANTERIOR AND POSTERIOR REPAIR WITH SACROSPINOUS FIXATION N/A 07/19/2022   Procedure: POSTERIOR REPAIR WITHP PERINEORRHAPHY AND  SACROSPINOUS FIXATION;  Surgeon: Marilynne Rosaline SAILOR, MD;  Location: [facility] [place];  Service: Gynecology;  Laterality: N/A;  Total time requested is 1.5hrs   BREAST CYST EXCISION Left    CHOLECYSTECTOMY     CHOLECYSTECTOMY, LAPAROSCOPIC  2010   COLONOSCOPY WITH PROPOFOL  N/A 03/22/2023   Procedure: COLONOSCOPY WITH PROPOFOL ;  Surgeon: Eartha Angelia Sieving, MD;  Location: [facility];  Service: Gastroenterology;  Laterality: N/A;  8:45AM;ASA 1   GLUTEUS MINIMUS REPAIR Left 01/24/2024   Procedure: REPAIR, TENDON, GLUTEUS MINIMUS;  Surgeon: Genelle Standing, MD;  Location: [facility];  Service: Orthopedics;  Laterality: Left;  LEFT GLUTEUS MAXIMUS TENDON TRANSFER   TOTAL ABDOMINAL HYSTERECTOMY W/ BILATERAL SALPINGOOPHORECTOMY     1990s   Patient Active Problem List   Diagnosis Date Noted    Tear of left gluteus medius tendon 01/24/2024   Candidiasis, intertrigo 08/26/2023   Seborrheic keratoses 08/26/2023   Greater trochanteric pain syndrome 05/30/2023   Acute cystitis without hematuria 05/30/2023   Encounter for colorectal cancer screening 03/22/2023   Nausea without vomiting 03/17/2023   Flatulence 03/17/2023   S/P hysterectomy 10/06/2022   Urinary frequency 10/06/2022   Vaginal burning 10/06/2022   Vaginal irritation 10/06/2022   Vulvar irritation 10/06/2022   Sebaceous cyst of labia 10/06/2022   BPPV (benign paroxysmal positional vertigo), left 01/29/2022   Laryngopharyngeal reflux (LPR) 01/29/2022   Pharyngoesophageal dysphagia 01/29/2022   HLD (hyperlipidemia)    PCP: Duanne Butler DASEN, MD  REFERRING PROVIDER: Genelle Standing, MD  REFERRING DIAG: [id number] (ICD-10-CM) - Pain in left hip 1. Left hip gluteus maximus tendon transfer 2. Left hip trochanteric bursectomy; DOS 01/24/24  THERAPY DIAG:  Pain in left hip  Muscle weakness (generalized)  Stiffness of left hip, not elsewhere classified  Other abnormalities of gait and mobility  Rationale for Evaluation and Treatment: Rehabilitation  ONSET DATE: DOS 01/24/24  SUBJECTIVE:   SUBJECTIVE STATEMENT: States she feels good and slept great last night. Reports that she booked a massage for Monday.   PERTINENT HISTORY: HLD, osteoporosis PAIN:  Are you having pain? no: NPRS scale: 2-3/10 Pain location: L hip Pain description: just stiff  Aggravating factors: walking Relieving factors:  rest  PRECAUTIONS: Other: Left hip gluteus maximus tendon transfer  WEIGHT BEARING RESTRICTIONS: WBAT  FALLS:  Has patient fallen in last 6 months? No  PLOF: Independent  PATIENT GOALS: be able to function normal again  OBJECTIVE: (objective measures from initial evaluation unless otherwise dated)  PATIENT SURVEYS:  LEFS  Extreme difficulty/unable (0), Quite a bit of difficulty (1), Moderate difficulty  (2), Little difficulty (3), No difficulty (4) Survey date:  01/26/24 03/13/24  Any of your usual work, housework or school activities 3 2  2. Usual hobbies, recreational or sporting activities 3 3  3. Getting into/out of the bath 0 4  4. Walking between rooms 4 3  5. Putting on socks/shoes 4 3  6. Squatting  4 4  7. Lifting an object, like a bag of groceries from the floor 4 4  8. Performing light activities around your home 3 4  9. Performing heavy activities around your home 3 2  10. Getting into/out of a car 4 4  11. Walking 2 blocks 3 2  12. Walking 1 mile 1 0  13. Going up/down 10 stairs (1 flight) 4 3  14. Standing for 1 hour 3 0  15.  sitting for 1 hour 4 2  16. Running on even ground 1 0  17. Running on uneven ground 1 0  18. Making sharp turns while running fast 1 0  19. Hopping  1 0  20. Rolling over in bed 3 4  Score total:  54/80 44/80     COGNITION: Overall cognitive status: Within functional limits for tasks assessed     SENSATION: WFL   POSTURE: rounded shoulders and forward head  PALPATION: Grossly tender lateral L hip  LOWER EXTREMITY ROM:  Passive ROM Right eval Left eval Left  9/30  Hip flexion  > 90 108 active supine  Hip extension     Hip abduction   20deg passive  Hip adduction     Hip internal rotation     Hip external rotation     Knee flexion     Knee extension     Ankle dorsiflexion     Ankle plantarflexion     Ankle inversion     Ankle eversion      (Blank rows = not tested) *= pain/symptoms  LOWER EXTREMITY MMT:  MMT Right eval Left eval Left  9/30  Hip flexion   5  Hip extension     Hip abduction     Hip adduction     Hip internal rotation     Hip external rotation     Knee flexion 5 5 5   Knee extension 5 4+ 4+  Ankle dorsiflexion     Ankle plantarflexion     Ankle inversion     Ankle eversion      (Blank rows = not tested) *= pain/symptoms   FUNCTIONAL TESTS:  NT Transfer: labored with UE  support  GAIT: Distance walked: 100 feet Assistive device utilized: rollator Level of assistance: Modified independence Comments: slightly flexed trunk with rollator, antalgic on L    TODAY'S TREATMENT:  DATE:  03/30/24 SciFit bike 5 min lvl 4 Shuttle DL 49# [id number] Shuttle SL 81# 3x10 Squats with airex pad under right foot 3x10 Lateral step ups 6 inch box 3x10 Partial lunges 2x10 RDL x10 5# dumbbells   03/27/24 STM and IASTM to L ITB, piriformis, and glutes  SciFit bike lvl 3 x6 min Leg extension machine 10# 3x10 Squats with airex pad under right foot 3x10 Forward knee bend stretch with LLE elevated on plinth 3x30 seconds   03/23/24 SciFit bike x5 Mini squats with BTB around knees 2x10 Lateral walks with BTB x6 laps on rail  Step ups 6 inch box 2x10 Cybex leg press 3x10 40# RDL x10 holding cane   03/20/24 SciFit bike L2.5 x5 min Mini squats with hands on rail 3x10 with airex pad under R foot  Lateral walks with GTB x6 laps  Forward step ups 6 inch box 3x10  03/15/24 Sci fit bike L2 x83min Bridges x20 Sidelying clam 2x10 LAQ 2# 5 2x10 Sit to stands 2x10 (cues for trunk flexion) Step ups fwd 4 2x10 Side stepping 3x 37ft Stepping over single hurdle x5  03/13/24 Sci fit bike L2 x13min Updated MMT and ROM Goal review Bridges x20 Sidelying clam 2x10 LAQ 2# 5 2x10 Sit to stands x10 Step ups fwd 4 2x10  03/08/24 Scifit L2, 5 min UE on rails: hip abdct to toe touch 3x5;  hip extension to toe tap 3x5 - each LE; Bil calf raises x 10 Lt forward step up 2 x 10 with RUE on rail; repeated on 4 x 10 Lt lateral step up 2 x 10 with BUE on rail; repeated on 4 x 10 Side stepping R/L with UE on rails Seated quad stretch with foot under chair, sliding buttocks forward 20sec x 2 LLE, x 1 RLE On Airex pad: slow marching, intermittent UE on rails;  narrow BOS eyes open, eyes closed x 10sec; semi tandem stance x 15sec each Gait:  25 ft x 5, with SPC cues for increasing Rt step height (to avoid shuffle),even stance time, Lt arm swing.   03/06/24 Scifit L1-2, 6 min LAQ 3# 5 2x10 Standing weight shifts on scales  Squats with light UE support Bil calf raises x 10 Standing high knee marching with UE on rails Side stepping R/L with UE on rails Trial of hip rotation with Lt lower leg on stool; limited UE on rails: Lt hip abdct to toe touch x 10; Lt hip extension to toe tap x 10 Prone Lt knee flexion with hip IR/ER, range to tolerance  02/27/24 Bike 5 minutes for hip mobility L2 PROM L hip within protocol limits Gait with SPC- x162ft. Cues for increased step length Hooklying hip abduction isometric with belt 15 x 5-10 second holds LAQ 2# 5 2x10 Partial bridges 2x10 Hooklying ball squeeze 2x10 5 hold Standing HR 2x10 Standing hip extension 2x10   PATIENT EDUCATION:  Education details: exercise progression/ modification Person educated: Patient Education method: Programmer, multimedia, Demonstration, Education comprehension: verbalized understanding, returned demonstration, verbal cues required, and tactile cues required  HOME EXERCISE PROGRAM: Access Code: [email or web address] URL: [email or web address] Updated 03/06/24  ASSESSMENT:  CLINICAL IMPRESSION: Patient tolerated exercises well today with no significant increase in pain. Therapy focused on progressing quad and glute strengthening to improve activity tolerance. Educated on healing timeline, returning to work, and safety with exercises. Verbal cueing to decrease compensation with trendelenburg pattern during exercises. Will continue to benefit from therapy to address remaining limitations and return to PLOF.  OBJECTIVE IMPAIRMENTS:  Abnormal gait, decreased activity tolerance, decreased balance, decreased endurance, decreased mobility, difficulty walking, decreased ROM, decreased  strength, increased muscle spasms, impaired flexibility, improper body mechanics, and pain  ACTIVITY LIMITATIONS: lifting, bending, sitting, standing, squatting, sleeping, stairs, transfers, locomotion level, and caring for others  PARTICIPATION LIMITATIONS: meal prep, cleaning, laundry, shopping, community activity, occupation, and yard work  PERSONAL FACTORS: Time since onset of injury/illness/exacerbation and 1-2 comorbidities: HLD, osteoporosis are also affecting patient's functional outcome.   REHAB POTENTIAL: Good  CLINICAL DECISION MAKING: Evolving/moderate complexity  EVALUATION COMPLEXITY: Moderate   GOALS: Goals reviewed with patient? Yes  SHORT TERM GOALS: Target date: 03/08/2024    Patient will be independent with HEP in order to improve functional outcomes. Baseline: Goal status: MET 03/13/24  2.  Patient will report at least 25% improvement in symptoms for improved quality of life. Baseline: Goal status: MET - 03/06/24  3.  Pain free flexion to 90 Baseline:  Goal status:MET   4.  SLS 30s without compensation Baseline: (will trial closer to 8wks per protocol) Goal status: INITIAL   LONG TERM GOALS: Target date: 04/19/2024    Patient will report at least 75% improvement in symptoms for improved quality of life. Baseline:  Goal status: IN PROGRESS  2.  Patient will improve LEFS score by at least 15 points in order to indicate improved tolerance to activity. Baseline:  Goal status: NOT MET 9/30  3.  Patient will be able to navigate stairs with reciprocal pattern without compensation in order to demonstrate improved LE strength. Baseline:  Goal status: IN PROGRESS  4.  Patient will demonstrate MMT 80% of opposite lower extremity in all tested musculature as evidence of improved strength to assist with stair ambulation and gait. Baseline:  Goal status: IN PROGRESS 03/13/24  5.  Patient will be able to return to all activities unrestricted for improved  ability to perform work functions and participate with family.  Baseline:  Goal status: IN PROGRESS  PLAN:  PT FREQUENCY: 1-2x/week  PT DURATION: 12 weeks  PLANNED INTERVENTIONS: [id number]- PT Re-evaluation, 97110-Therapeutic exercises, 97530- Therapeutic activity, [id number]- Neuromuscular re-education, 97535- Self Care, [id number]- Manual therapy, [phone]- Gait training, [phone]- Orthotic Fit/training, [phone]- Canalith repositioning, [id number]- Aquatic Therapy, [phone]- Splinting, [phone]- Wound care (first 20 sq cm), 97598- Wound care (each additional 20 sq cm)Patient/Family education, Balance training, Stair training, Taping, Dry Needling, Joint mobilization, Joint manipulation, Spinal manipulation, Spinal mobilization, Scar mobilization, and DME instructions.  PLAN FOR NEXT SESSION: progress with glute tendon transfer protocol  Lili Finder, Student-PT 03/30/2024, 12:28 PM   This entire session was performed under direct supervision and direction of a licensed therapist/therapist assistant . I have personally read, edited and approve of the note as written. 12:28 PM, 03/30/24 Prentice CANDIE Stains PT, DPT Physical Therapist at [facility]

## 2024-04-03 ENCOUNTER — Ambulatory Visit (HOSPITAL_BASED_OUTPATIENT_CLINIC_OR_DEPARTMENT_OTHER): Admitting: Physical Therapy

## 2024-04-03 ENCOUNTER — Encounter (HOSPITAL_BASED_OUTPATIENT_CLINIC_OR_DEPARTMENT_OTHER): Payer: Self-pay | Admitting: Physical Therapy

## 2024-04-03 DIAGNOSIS — R2689 Other abnormalities of gait and mobility: Secondary | ICD-10-CM | POA: Diagnosis not present

## 2024-04-03 DIAGNOSIS — M6281 Muscle weakness (generalized): Secondary | ICD-10-CM

## 2024-04-03 DIAGNOSIS — M25552 Pain in left hip: Secondary | ICD-10-CM

## 2024-04-03 DIAGNOSIS — M25652 Stiffness of left hip, not elsewhere classified: Secondary | ICD-10-CM | POA: Diagnosis not present

## 2024-04-03 NOTE — Therapy (Addendum)
 OUTPATIENT PHYSICAL THERAPY TREATMENT      Patient Name: Taylor Singleton MRN: [id number] DOB:01-04-1956, 68 y.o., female Today's Date: 04/03/2024  END OF SESSION:  PT End of Session - 04/03/24 1145     Visit Number 16    Number of Visits 24    Date for Recertification  04/19/24    Authorization Type [employer] MEDICARE    Progress Note Due on Visit 20    PT Start Time 1150    PT Stop Time 1234    PT Time Calculation (min) 44 min    Activity Tolerance Patient tolerated treatment well    Behavior During Therapy WFL for tasks assessed/performed         Past Medical History:  Diagnosis Date   Anxiety    Arthritis    Chronic idiopathic constipation    GERD (gastroesophageal reflux disease)    HLD (hyperlipidemia)    Hyperlipidemia    Osteoporosis    Prolapse of posterior vaginal wall    Sciatica    Vaginal vault 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    Varicose vein of leg    Wears glasses    Past Surgical History:  Procedure Laterality Date   ANTERIOR AND POSTERIOR REPAIR WITH SACROSPINOUS FIXATION N/A 07/19/2022   Procedure: POSTERIOR REPAIR WITHP PERINEORRHAPHY AND  SACROSPINOUS FIXATION;  Surgeon: Marilynne Rosaline SAILOR, MD;  Location: [facility];  Service: Gynecology;  Laterality: N/A;  Total time requested is 1.5hrs   BREAST CYST EXCISION Left    CHOLECYSTECTOMY     CHOLECYSTECTOMY, LAPAROSCOPIC  2010   COLONOSCOPY WITH PROPOFOL  N/A 03/22/2023   Procedure: COLONOSCOPY WITH PROPOFOL ;  Surgeon: Eartha Angelia Sieving, MD;  Location: [facility];  Service: Gastroenterology;  Laterality: N/A;  8:45AM;ASA 1   GLUTEUS MINIMUS REPAIR Left 01/24/2024   Procedure: REPAIR, TENDON, GLUTEUS MINIMUS;  Surgeon: Genelle Standing, MD;  Location:  S[facility]ERY C[facility];  Service: Orthopedics;  Laterality: Left;  LEFT GLUTEUS MAXIMUS TENDON TRANSFER   TOTAL ABDOMINAL HYSTERECTOMY W/ BILATERAL SALPINGOOPHORECTOMY     1990s   Patient Active Problem List   Diagnosis Date Noted    Tear of left gluteus medius tendon 01/24/2024   Candidiasis, intertrigo 08/26/2023   Seborrheic keratoses 08/26/2023   Greater trochanteric pain syndrome 05/30/2023   Acute cystitis without hematuria 05/30/2023   Encounter for colorectal cancer screening 03/22/2023   Nausea without vomiting 03/17/2023   Flatulence 03/17/2023   S/P hysterectomy 10/06/2022   Urinary frequency 10/06/2022   Vaginal burning 10/06/2022   Vaginal irritation 10/06/2022   Vulvar irritation 10/06/2022   Sebaceous cyst of labia 10/06/2022   BPPV (benign paroxysmal positional vertigo), left 01/29/2022   Laryngopharyngeal reflux (LPR) 01/29/2022   Pharyngoesophageal dysphagia 01/29/2022   HLD (hyperlipidemia)    PCP: Duanne Butler DASEN, MD  REFERRING PROVIDER: Genelle Standing, MD  REFERRING DIAG: [phone] (ICD-10-CM) - Pain in left hip 1. Left hip gluteus maximus tendon transfer 2. Left hip trochanteric bursectomy; DOS 01/24/24  THERAPY DIAG:  Pain in left hip  Other abnormalities of gait and mobility  Muscle weakness (generalized)  Stiffness of left hip, not elsewhere classified  Rationale for Evaluation and Treatment: Rehabilitation  ONSET DATE: DOS 01/24/24  SUBJECTIVE:   SUBJECTIVE STATEMENT: States she feels great today. Is not using cane anymore. Reports she was unable to get massage Monday.   PERTINENT HISTORY: HLD, osteoporosis PAIN:  Are you having pain? no: NPRS scale: 2-3/10 Pain location: L hip Pain description: just stiff  Aggravating factors: walking Relieving  factors: rest  PRECAUTIONS: Other: Left hip gluteus maximus tendon transfer  WEIGHT BEARING RESTRICTIONS: WBAT  FALLS:  Has patient fallen in last 6 months? No  PLOF: Independent  PATIENT GOALS: be able to function normal again  OBJECTIVE: (objective measures from initial evaluation unless otherwise dated)  PATIENT SURVEYS:  LEFS  Extreme difficulty/unable (0), Quite a bit of difficulty (1), Moderate  difficulty (2), Little difficulty (3), No difficulty (4) Survey date:  01/26/24 03/13/24  Any of your usual work, housework or school activities 3 2  2. Usual hobbies, recreational or sporting activities 3 3  3. Getting into/out of the bath 0 4  4. Walking between rooms 4 3  5. Putting on socks/shoes 4 3  6. Squatting  4 4  7. Lifting an object, like a bag of groceries from the floor 4 4  8. Performing light activities around your home 3 4  9. Performing heavy activities around your home 3 2  10. Getting into/out of a car 4 4  11. Walking 2 blocks 3 2  12. Walking 1 mile 1 0  13. Going up/down 10 stairs (1 flight) 4 3  14. Standing for 1 hour 3 0  15.  sitting for 1 hour 4 2  16. Running on even ground 1 0  17. Running on uneven ground 1 0  18. Making sharp turns while running fast 1 0  19. Hopping  1 0  20. Rolling over in bed 3 4  Score total:  54/80 44/80     COGNITION: Overall cognitive status: Within functional limits for tasks assessed     SENSATION: WFL   POSTURE: rounded shoulders and forward head  PALPATION: Grossly tender lateral L hip  LOWER EXTREMITY ROM:  Passive ROM Right eval Left eval Left  9/30  Hip flexion  > 90 108 active supine  Hip extension     Hip abduction   20deg passive  Hip adduction     Hip internal rotation     Hip external rotation     Knee flexion     Knee extension     Ankle dorsiflexion     Ankle plantarflexion     Ankle inversion     Ankle eversion      (Blank rows = not tested) *= pain/symptoms  LOWER EXTREMITY MMT:  MMT Right eval Left eval Left  9/30  Hip flexion   5  Hip extension     Hip abduction     Hip adduction     Hip internal rotation     Hip external rotation     Knee flexion 5 5 5   Knee extension 5 4+ 4+  Ankle dorsiflexion     Ankle plantarflexion     Ankle inversion     Ankle eversion      (Blank rows = not tested) *= pain/symptoms   FUNCTIONAL TESTS:  NT Transfer: labored with UE  support  GAIT: Distance walked: 100 feet Assistive device utilized: rollator Level of assistance: Modified independence Comments: slightly flexed trunk with rollator, antalgic on L    TODAY'S TREATMENT:  DATE:  04/03/24 Elliptical x3 min  Shuttle DL 49# [id number], 27# [id number] Shuttle SL 18# x10, 25# 2x10 Squats with blue TB around knees 2x10 Lateral walks with blue TB x2 laps  Lateral walks with mirror feedback x2 laps  Mini squats with mirror feedback x10  Partial lunges 3x10  03/30/24 SciFit bike 5 min lvl 4 Shuttle DL 49# [id number] Shuttle SL 81# 3x10 Squats with airex pad under right foot 3x10 Lateral step ups 6 inch box 3x10 Partial lunges 2x10 RDL x10 5# dumbbells   03/27/24 STM and IASTM to L ITB, piriformis, and glutes  SciFit bike lvl 3 x6 min Leg extension machine 10# 3x10 Squats with airex pad under right foot 3x10 Forward knee bend stretch with LLE elevated on plinth 3x30 seconds   03/23/24 SciFit bike x5 Mini squats with BTB around knees 2x10 Lateral walks with BTB x6 laps on rail  Step ups 6 inch box 2x10 Cybex leg press 3x10 40# RDL x10 holding cane   03/20/24 SciFit bike L2.5 x5 min Mini squats with hands on rail 3x10 with airex pad under R foot  Lateral walks with GTB x6 laps  Forward step ups 6 inch box 3x10  03/15/24 Sci fit bike L2 x19min Bridges x20 Sidelying clam 2x10 LAQ 2# 5 2x10 Sit to stands 2x10 (cues for trunk flexion) Step ups fwd 4 2x10 Side stepping 3x 60ft Stepping over single hurdle x5  03/13/24 Sci fit bike L2 x50min Updated MMT and ROM Goal review Bridges x20 Sidelying clam 2x10 LAQ 2# 5 2x10 Sit to stands x10 Step ups fwd 4 2x10  03/08/24 Scifit L2, 5 min UE on rails: hip abdct to toe touch 3x5;  hip extension to toe tap 3x5 - each LE; Bil calf raises x 10 Lt forward step up 2 x 10 with RUE on rail;  repeated on 4 x 10 Lt lateral step up 2 x 10 with BUE on rail; repeated on 4 x 10 Side stepping R/L with UE on rails Seated quad stretch with foot under chair, sliding buttocks forward 20sec x 2 LLE, x 1 RLE On Airex pad: slow marching, intermittent UE on rails; narrow BOS eyes open, eyes closed x 10sec; semi tandem stance x 15sec each Gait:  25 ft x 5, with SPC cues for increasing Rt step height (to avoid shuffle),even stance time, Lt arm swing.   03/06/24 Scifit L1-2, 6 min LAQ 3# 5 2x10 Standing weight shifts on scales  Squats with light UE support Bil calf raises x 10 Standing high knee marching with UE on rails Side stepping R/L with UE on rails Trial of hip rotation with Lt lower leg on stool; limited UE on rails: Lt hip abdct to toe touch x 10; Lt hip extension to toe tap x 10 Prone Lt knee flexion with hip IR/ER, range to tolerance  02/27/24 Bike 5 minutes for hip mobility L2 PROM L hip within protocol limits Gait with SPC- x162ft. Cues for increased step length Hooklying hip abduction isometric with belt 15 x 5-10 second holds LAQ 2# 5 2x10 Partial bridges 2x10 Hooklying ball squeeze 2x10 5 hold Standing HR 2x10 Standing hip extension 2x10   PATIENT EDUCATION:  Education details: exercise progression/ modification Person educated: Patient Education method: Programmer, multimedia, Demonstration, Education comprehension: verbalized understanding, returned demonstration, verbal cues required, and tactile cues required  HOME EXERCISE PROGRAM: Access Code: [email or web address] URL: [email or web address] Updated 03/06/24  ASSESSMENT:  CLINICAL IMPRESSION: Patient tolerated exercises well today with no significant increase in  pain. Therapy focused on progressing quad and glute strengthening to improve activity tolerance and compensation. Educated on healing timeline, returning to work, and safety with exercises. Utilized Banker and verbal cueing to improve  trendelenburg compensation with certain exercises. Will continue to benefit from therapy to address remaining limitations and return to PLOF.   OBJECTIVE IMPAIRMENTS: Abnormal gait, decreased activity tolerance, decreased balance, decreased endurance, decreased mobility, difficulty walking, decreased ROM, decreased strength, increased muscle spasms, impaired flexibility, improper body mechanics, and pain  ACTIVITY LIMITATIONS: lifting, bending, sitting, standing, squatting, sleeping, stairs, transfers, locomotion level, and caring for others  PARTICIPATION LIMITATIONS: meal prep, cleaning, laundry, shopping, community activity, occupation, and yard work  PERSONAL FACTORS: Time since onset of injury/illness/exacerbation and 1-2 comorbidities: HLD, osteoporosis are also affecting patient's functional outcome.   REHAB POTENTIAL: Good  CLINICAL DECISION MAKING: Evolving/moderate complexity  EVALUATION COMPLEXITY: Moderate   GOALS: Goals reviewed with patient? Yes  SHORT TERM GOALS: Target date: 03/08/2024    Patient will be independent with HEP in order to improve functional outcomes. Baseline: Goal status: MET 03/13/24  2.  Patient will report at least 25% improvement in symptoms for improved quality of life. Baseline: Goal status: MET - 03/06/24  3.  Pain free flexion to 90 Baseline:  Goal status:MET   4.  SLS 30s without compensation Baseline: (will trial closer to 8wks per protocol) Goal status: INITIAL   LONG TERM GOALS: Target date: 04/19/2024    Patient will report at least 75% improvement in symptoms for improved quality of life. Baseline:  Goal status: IN PROGRESS  2.  Patient will improve LEFS score by at least 15 points in order to indicate improved tolerance to activity. Baseline:  Goal status: NOT MET 9/30  3.  Patient will be able to navigate stairs with reciprocal pattern without compensation in order to demonstrate improved LE strength. Baseline:  Goal  status: IN PROGRESS  4.  Patient will demonstrate MMT 80% of opposite lower extremity in all tested musculature as evidence of improved strength to assist with stair ambulation and gait. Baseline:  Goal status: IN PROGRESS 03/13/24  5.  Patient will be able to return to all activities unrestricted for improved ability to perform work functions and participate with family.  Baseline:  Goal status: IN PROGRESS  PLAN:  PT FREQUENCY: 1-2x/week  PT DURATION: 12 weeks  PLANNED INTERVENTIONS: 97164- PT Re-evaluation, 97110-Therapeutic exercises, 97530- Therapeutic activity, [id number]- Neuromuscular re-education, 97535- Self Care, [id number]- Manual therapy, [phone]- Gait training, [phone]- Orthotic Fit/training, [phone]- Canalith repositioning, [id number]- Aquatic Therapy, [phone]- Splinting, [phone]- Wound care (first 20 sq cm), 97598- Wound care (each additional 20 sq cm)Patient/Family education, Balance training, Stair training, Taping, Dry Needling, Joint mobilization, Joint manipulation, Spinal manipulation, Spinal mobilization, Scar mobilization, and DME instructions.  PLAN FOR NEXT SESSION: progress with glute tendon transfer protocol  Lili Finder, Student-PT 04/03/2024, 12:34 PM   This entire session was performed under direct supervision and direction of a licensed therapist/therapist assistant . I have personally read, edited and approve of the note as written. 12:36 PM, 04/03/24 Prentice CANDIE Stains PT, DPT Physical Therapist at [facility]

## 2024-04-04 ENCOUNTER — Telehealth (HOSPITAL_BASED_OUTPATIENT_CLINIC_OR_DEPARTMENT_OTHER): Payer: Self-pay | Admitting: Orthopaedic Surgery

## 2024-04-04 ENCOUNTER — Encounter (HOSPITAL_BASED_OUTPATIENT_CLINIC_OR_DEPARTMENT_OTHER): Payer: Self-pay | Admitting: Orthopaedic Surgery

## 2024-04-04 NOTE — Telephone Encounter (Signed)
 Patient wants to know she can get a note saying she can go back to work on Dec 1 and she will pick on thursday

## 2024-04-05 ENCOUNTER — Ambulatory Visit

## 2024-04-05 DIAGNOSIS — B351 Tinea unguium: Secondary | ICD-10-CM | POA: Diagnosis not present

## 2024-04-05 DIAGNOSIS — L603 Nail dystrophy: Secondary | ICD-10-CM | POA: Diagnosis not present

## 2024-04-05 DIAGNOSIS — G629 Polyneuropathy, unspecified: Secondary | ICD-10-CM | POA: Diagnosis not present

## 2024-04-05 MED ORDER — GABAPENTIN 100 MG PO CAPS: 100.0000 mg | ORAL_CAPSULE | Freq: Every day | ORAL | 3 refills | Status: DC

## 2024-04-05 NOTE — Progress Notes (Signed)
 Subjective:  Patient ID: Taylor Singleton, female    DOB: Jan 08, 1956,  MRN: [id number]  Chief Complaint  Patient presents with   Nail Problem    Rm21 Patient complains of Left hallux thick toenail and Right hallux nail is splitting in half/not diabetic.    Discussed the use of AI scribe software for clinical note transcription with the patient, who gave verbal consent to proceed.  History of Present Illness Taylor Singleton is a 68 year old female who presents with concerns about toenail issues and neuropathy.  She experiences longitudinal splitting of the right toenail and is concerned about the potential for an ingrown toenail. She manages this by keeping the nail clean and using a bandage. Her daughter has a similar issue.  Her left big toe nail is curved into the nail borders without causing any pain or discomfort.   Neuropathy causes burning and tingling sensations, particularly at night, and is exacerbated by restless leg syndrome. She uses lidocaine  cream for some relief and is currently in physical therapy, which she finds beneficial. She is aware of shuffling her feet, which her husband has also noticed. She attributes some gait issues to delaying gluteus medius repair surgery due to travel plans.  She recently purchased new stable supportive shoes, similar to her previous ones, which she finds comfortable. She is concerned about her gait and the impact of her footwear on her foot health.     Objective:    Constitutional Well developed. Well nourished. Oriented to person, place, and time.  Vascular Dorsalis pedis pulses palpable bilaterally. Posterior tibial pulses palpable bilaterally. Capillary refill normal to all digits.  No cyanosis or clubbing noted. Pedal hair growth normal.  Neurologic Normal speech. Epicritic sensation to light touch grossly intact bilaterally. Subjective description of neuropathic symptoms.  Negative tinel sign at tarsal tunnel bilaterally.    Dermatologic Skin texture and turgor are within normal limits.  no open wounds no skin lesions. Right foot hallux nail has a longitudinal split extending from the distal aspect to approximately the central part of the nail.  Minimal lifting of distal nail.  Proximal aspect of nail well adhered without split.  Proximal nail fold appears healthy. Left foot hallux nail has pincer shaped to it with some ingrowing nature.  There is no pain to palpation of either nail border or sign of infection.  Musculoskeletal: 5/5 muscle strength to all major pedal muscle groups. No contributing deformity.      Assessment:   1. Onychomycosis   2. Dystrophic nail   3. Neuropathy      Plan:  Patient was evaluated and treated and all questions answered.  Assessment and Plan Assessment & Plan Dystrophic Toenail Right hallux toenail splits and cracks, likely due to trauma. Left hallux toenail pincir shaped. No ingrown toenail or fungus. - Monitor for changes or signs of ingrowth. - Clip toenail to remove loose edges. - Advise to keep nail area clean and moisturized. - Discussed performing permanent PNA on left hallux due to pincir shape and potential ingrowing. Patient would like to hold off on this for now.   Neuropathy and Restless Leg Syndrome Burning and tingling in feet consistent with neuropathy and restless leg syndrome. Gabapentin  considered for symptom management. - Prescribe gabapentin  100 mg at night. Discussed titration of dosage. She will start with once daily 100mg  before dinner.  - Advise to monitor for side effects and report issues. - Instruct to contact office if symptoms persist or worsen.  RTC PRN  Prentice Ovens, DPM AACFAS [facility] Trained Podiatric Surgeon [facility]

## 2024-04-06 ENCOUNTER — Encounter (HOSPITAL_BASED_OUTPATIENT_CLINIC_OR_DEPARTMENT_OTHER): Payer: Self-pay | Admitting: Physical Therapy

## 2024-04-06 ENCOUNTER — Ambulatory Visit (HOSPITAL_BASED_OUTPATIENT_CLINIC_OR_DEPARTMENT_OTHER): Admitting: Physical Therapy

## 2024-04-06 DIAGNOSIS — M25652 Stiffness of left hip, not elsewhere classified: Secondary | ICD-10-CM | POA: Diagnosis not present

## 2024-04-06 DIAGNOSIS — R2689 Other abnormalities of gait and mobility: Secondary | ICD-10-CM | POA: Diagnosis not present

## 2024-04-06 DIAGNOSIS — M6281 Muscle weakness (generalized): Secondary | ICD-10-CM

## 2024-04-06 DIAGNOSIS — M25552 Pain in left hip: Secondary | ICD-10-CM | POA: Diagnosis not present

## 2024-04-06 NOTE — Therapy (Addendum)
 OUTPATIENT PHYSICAL THERAPY TREATMENT      Patient Name: Taylor Singleton MRN: [id number] DOB:[birth year], 68 y.o., female Today's Date: 04/06/2024  END OF SESSION:  PT End of Session - 04/06/24 1142     Visit Number 17    Number of Visits 24    Date for Recertification  04/19/24    Authorization Type [employer] MEDICARE    Progress Note Due on Visit 20    PT Start Time 1145    PT Stop Time 1227    PT Time Calculation (min) 42 min    Activity Tolerance Patient tolerated treatment well    Behavior During Therapy WFL for tasks assessed/performed         Past Medical History:  Diagnosis Date   Anxiety    Arthritis    Chronic idiopathic constipation    GERD (gastroesophageal reflux disease)    HLD (hyperlipidemia)    Hyperlipidemia    Osteoporosis    Prolapse of posterior vaginal wall    Sciatica    Vaginal vault 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    Varicose vein of leg    Wears glasses    Past Surgical History:  Procedure Laterality Date   ANTERIOR AND POSTERIOR REPAIR WITH SACROSPINOUS FIXATION N/A 07/19/2022   Procedure: POSTERIOR REPAIR WITHP PERINEORRHAPHY AND  SACROSPINOUS FIXATION;  Surgeon: Marilynne Rosaline SAILOR, MD;  Location: [facility] [place];  Service: Gynecology;  Laterality: N/A;  Total time requested is 1.5hrs   BREAST CYST EXCISION Left    CHOLECYSTECTOMY     CHOLECYSTECTOMY, LAPAROSCOPIC  2010   COLONOSCOPY WITH PROPOFOL  N/A 03/22/2023   Procedure: COLONOSCOPY WITH PROPOFOL ;  Surgeon: Eartha Angelia Sieving, MD;  Location: [facility];  Service: Gastroenterology;  Laterality: N/A;  8:45AM;ASA 1   GLUTEUS MINIMUS REPAIR Left 01/24/2024   Procedure: REPAIR, TENDON, GLUTEUS MINIMUS;  Surgeon: Genelle Standing, MD;  Location: [facility];  Service: Orthopedics;  Laterality: Left;  LEFT GLUTEUS MAXIMUS TENDON TRANSFER   TOTAL ABDOMINAL HYSTERECTOMY W/ BILATERAL SALPINGOOPHORECTOMY     1990s   Patient Active Problem List   Diagnosis Date Noted    Tear of left gluteus medius tendon 01/24/2024   Candidiasis, intertrigo 08/26/2023   Seborrheic keratoses 08/26/2023   Greater trochanteric pain syndrome 05/30/2023   Acute cystitis without hematuria 05/30/2023   Encounter for colorectal cancer screening 03/22/2023   Nausea without vomiting 03/17/2023   Flatulence 03/17/2023   S/P hysterectomy 10/06/2022   Urinary frequency 10/06/2022   Vaginal burning 10/06/2022   Vaginal irritation 10/06/2022   Vulvar irritation 10/06/2022   Sebaceous cyst of labia 10/06/2022   BPPV (benign paroxysmal positional vertigo), left 01/29/2022   Laryngopharyngeal reflux (LPR) 01/29/2022   Pharyngoesophageal dysphagia 01/29/2022   HLD (hyperlipidemia)    PCP: Duanne Butler DASEN, MD  REFERRING PROVIDER: Genelle Standing, MD  REFERRING DIAG: [id number] (ICD-10-CM) - Pain in left hip 1. Left hip gluteus maximus tendon transfer 2. Left hip trochanteric bursectomy; DOS 01/24/24  THERAPY DIAG:  Pain in left hip  Other abnormalities of gait and mobility  Muscle weakness (generalized)  Stiffness of left hip, not elsewhere classified  Rationale for Evaluation and Treatment: Rehabilitation  ONSET DATE: DOS 01/24/24  SUBJECTIVE:   SUBJECTIVE STATEMENT: States she feels good today. Getting massage next Tuesday. Really stiff when she wakes up in the morning.   PERTINENT HISTORY: HLD, osteoporosis PAIN:  Are you having pain? no: NPRS scale: 2-3/10 Pain location: L hip Pain description: just stiff  Aggravating factors: walking Relieving  factors: rest  PRECAUTIONS: Other: Left hip gluteus maximus tendon transfer  WEIGHT BEARING RESTRICTIONS: WBAT  FALLS:  Has patient fallen in last 6 months? No  PLOF: Independent  PATIENT GOALS: be able to function normal again  OBJECTIVE: (objective measures from initial evaluation unless otherwise dated)  PATIENT SURVEYS:  LEFS  Extreme difficulty/unable (0), Quite a bit of difficulty (1), Moderate  difficulty (2), Little difficulty (3), No difficulty (4) Survey date:  01/26/24 03/13/24  Any of your usual work, housework or school activities 3 2  2. Usual hobbies, recreational or sporting activities 3 3  3. Getting into/out of the bath 0 4  4. Walking between rooms 4 3  5. Putting on socks/shoes 4 3  6. Squatting  4 4  7. Lifting an object, like a bag of groceries from the floor 4 4  8. Performing light activities around your home 3 4  9. Performing heavy activities around your home 3 2  10. Getting into/out of a car 4 4  11. Walking 2 blocks 3 2  12. Walking 1 mile 1 0  13. Going up/down 10 stairs (1 flight) 4 3  14. Standing for 1 hour 3 0  15.  sitting for 1 hour 4 2  16. Running on even ground 1 0  17. Running on uneven ground 1 0  18. Making sharp turns while running fast 1 0  19. Hopping  1 0  20. Rolling over in bed 3 4  Score total:  54/80 44/80     COGNITION: Overall cognitive status: Within functional limits for tasks assessed     SENSATION: WFL   POSTURE: rounded shoulders and forward head  PALPATION: Grossly tender lateral L hip  LOWER EXTREMITY ROM:  Passive ROM Right eval Left eval Left  9/30  Hip flexion  > 90 108 active supine  Hip extension     Hip abduction   20deg passive  Hip adduction     Hip internal rotation     Hip external rotation     Knee flexion     Knee extension     Ankle dorsiflexion     Ankle plantarflexion     Ankle inversion     Ankle eversion      (Blank rows = not tested) *= pain/symptoms  LOWER EXTREMITY MMT:  MMT Right eval Left eval Left  9/30  Hip flexion   5  Hip extension     Hip abduction     Hip adduction     Hip internal rotation     Hip external rotation     Knee flexion 5 5 5   Knee extension 5 4+ 4+  Ankle dorsiflexion     Ankle plantarflexion     Ankle inversion     Ankle eversion      (Blank rows = not tested) *= pain/symptoms   FUNCTIONAL TESTS:  NT Transfer: labored with UE  support  GAIT: Distance walked: 100 feet Assistive device utilized: rollator Level of assistance: Modified independence Comments: slightly flexed trunk with rollator, antalgic on L    TODAY'S TREATMENT:  DATE:  04/06/24 Elliptical 4 min  LF leg press 3x10 10# Leg extension 3x10 10#  Hip abduction 25# 3x10 Leg curl 10# x10 Hip adduction 10# x10  04/03/24 Elliptical x3 min  Shuttle DL 49# [id number], 27# [id number] Shuttle SL 18# x10, 25# 2x10 Squats with blue TB around knees 2x10 Lateral walks with blue TB x2 laps  Lateral walks with mirror feedback x2 laps  Mini squats with mirror feedback x10  Partial lunges 3x10  03/30/24 SciFit bike 5 min lvl 4 Shuttle DL 49# [id number] Shuttle SL 81# 3x10 Squats with airex pad under right foot 3x10 Lateral step ups 6 inch box 3x10 Partial lunges 2x10 RDL x10 5# dumbbells   03/27/24 STM and IASTM to L ITB, piriformis, and glutes  SciFit bike lvl 3 x6 min Leg extension machine 10# 3x10 Squats with airex pad under right foot 3x10 Forward knee bend stretch with LLE elevated on plinth 3x30 seconds   03/23/24 SciFit bike x5 Mini squats with BTB around knees 2x10 Lateral walks with BTB x6 laps on rail  Step ups 6 inch box 2x10 Cybex leg press 3x10 40# RDL x10 holding cane   03/20/24 SciFit bike L2.5 x5 min Mini squats with hands on rail 3x10 with airex pad under R foot  Lateral walks with GTB x6 laps  Forward step ups 6 inch box 3x10  03/15/24 Sci fit bike L2 x52min Bridges x20 Sidelying clam 2x10 LAQ 2# 5 2x10 Sit to stands 2x10 (cues for trunk flexion) Step ups fwd 4 2x10 Side stepping 3x 76ft Stepping over single hurdle x5  03/13/24 Sci fit bike L2 x67min Updated MMT and ROM Goal review Bridges x20 Sidelying clam 2x10 LAQ 2# 5 2x10 Sit to stands x10 Step ups fwd 4 2x10  03/08/24 Scifit L2, 5 min UE on  rails: hip abdct to toe touch 3x5;  hip extension to toe tap 3x5 - each LE; Bil calf raises x 10 Lt forward step up 2 x 10 with RUE on rail; repeated on 4 x 10 Lt lateral step up 2 x 10 with BUE on rail; repeated on 4 x 10 Side stepping R/L with UE on rails Seated quad stretch with foot under chair, sliding buttocks forward 20sec x 2 LLE, x 1 RLE On Airex pad: slow marching, intermittent UE on rails; narrow BOS eyes open, eyes closed x 10sec; semi tandem stance x 15sec each Gait:  25 ft x 5, with SPC cues for increasing Rt step height (to avoid shuffle),even stance time, Lt arm swing.   03/06/24 Scifit L1-2, 6 min LAQ 3# 5 2x10 Standing weight shifts on scales  Squats with light UE support Bil calf raises x 10 Standing high knee marching with UE on rails Side stepping R/L with UE on rails Trial of hip rotation with Lt lower leg on stool; limited UE on rails: Lt hip abdct to toe touch x 10; Lt hip extension to toe tap x 10 Prone Lt knee flexion with hip IR/ER, range to tolerance  02/27/24 Bike 5 minutes for hip mobility L2 PROM L hip within protocol limits Gait with SPC- x166ft. Cues for increased step length Hooklying hip abduction isometric with belt 15 x 5-10 second holds LAQ 2# 5 2x10 Partial bridges 2x10 Hooklying ball squeeze 2x10 5 hold Standing HR 2x10 Standing hip extension 2x10   PATIENT EDUCATION:  Education details: exercise progression/ modification Person educated: Patient Education method: Explanation, Demonstration, Education comprehension: verbalized understanding, returned demonstration, verbal cues required, and tactile  cues required  HOME EXERCISE PROGRAM: Access Code: [email or web address] URL: [email or web address] Updated 03/06/24  ASSESSMENT:  CLINICAL IMPRESSION: Patient tolerated exercises well today with no significant increase in pain. Therapy focused on utilizing gym equipment to improve patients confidence in returning to personal gym  routine. Verbal cueing for proper breathing during exercises. Educated on Conservation officer, historic buildings and activity modification after session with soreness. Will continue to benefit from therapy to address remaining limitations.   OBJECTIVE IMPAIRMENTS: Abnormal gait, decreased activity tolerance, decreased balance, decreased endurance, decreased mobility, difficulty walking, decreased ROM, decreased strength, increased muscle spasms, impaired flexibility, improper body mechanics, and pain  ACTIVITY LIMITATIONS: lifting, bending, sitting, standing, squatting, sleeping, stairs, transfers, locomotion level, and caring for others  PARTICIPATION LIMITATIONS: meal prep, cleaning, laundry, shopping, community activity, occupation, and yard work  PERSONAL FACTORS: Time since onset of injury/illness/exacerbation and 1-2 comorbidities: HLD, osteoporosis are also affecting patient's functional outcome.   REHAB POTENTIAL: Good  CLINICAL DECISION MAKING: Evolving/moderate complexity  EVALUATION COMPLEXITY: Moderate   GOALS: Goals reviewed with patient? Yes  SHORT TERM GOALS: Target date: 03/08/2024    Patient will be independent with HEP in order to improve functional outcomes. Baseline: Goal status: MET 03/13/24  2.  Patient will report at least 25% improvement in symptoms for improved quality of life. Baseline: Goal status: MET - 03/06/24  3.  Pain free flexion to 90 Baseline:  Goal status:MET   4.  SLS 30s without compensation Baseline: (will trial closer to 8wks per protocol) Goal status: INITIAL   LONG TERM GOALS: Target date: 04/19/2024    Patient will report at least 75% improvement in symptoms for improved quality of life. Baseline:  Goal status: IN PROGRESS  2.  Patient will improve LEFS score by at least 15 points in order to indicate improved tolerance to activity. Baseline:  Goal status: NOT MET 9/30  3.  Patient will be able to navigate stairs with reciprocal pattern  without compensation in order to demonstrate improved LE strength. Baseline:  Goal status: IN PROGRESS  4.  Patient will demonstrate MMT 80% of opposite lower extremity in all tested musculature as evidence of improved strength to assist with stair ambulation and gait. Baseline:  Goal status: IN PROGRESS 03/13/24  5.  Patient will be able to return to all activities unrestricted for improved ability to perform work functions and participate with family.  Baseline:  Goal status: IN PROGRESS  PLAN:  PT FREQUENCY: 1-2x/week  PT DURATION: 12 weeks  PLANNED INTERVENTIONS: [id number]- PT Re-evaluation, 97110-Therapeutic exercises, 97530- Therapeutic activity, [id number]- Neuromuscular re-education, 97535- Self Care, [id number]- Manual therapy, [phone]- Gait training, [phone]- Orthotic Fit/training, [phone]- Canalith repositioning, [id number]- Aquatic Therapy, [phone]- Splinting, [phone]- Wound care (first 20 sq cm), 97598- Wound care (each additional 20 sq cm)Patient/Family education, Balance training, Stair training, Taping, Dry Needling, Joint mobilization, Joint manipulation, Spinal manipulation, Spinal mobilization, Scar mobilization, and DME instructions.  PLAN FOR NEXT SESSION: progress with glute tendon transfer protocol  Lili Finder, Student-PT 04/06/2024, 12:26 PM   This entire session was performed under direct supervision and direction of a licensed therapist/therapist assistant . I have personally read, edited and approve of the note as written. 12:55 PM, 04/06/24 Prentice CANDIE Stains PT, DPT Physical Therapist at [facility]

## 2024-04-10 ENCOUNTER — Encounter (HOSPITAL_BASED_OUTPATIENT_CLINIC_OR_DEPARTMENT_OTHER): Admitting: Physical Therapy

## 2024-04-11 ENCOUNTER — Other Ambulatory Visit: Payer: Self-pay | Admitting: Family Medicine

## 2024-04-11 DIAGNOSIS — K224 Dyskinesia of esophagus: Secondary | ICD-10-CM

## 2024-04-11 DIAGNOSIS — K219 Gastro-esophageal reflux disease without esophagitis: Secondary | ICD-10-CM

## 2024-04-12 ENCOUNTER — Telehealth: Payer: Self-pay

## 2024-04-12 NOTE — Telephone Encounter (Signed)
 Copied from CRM #[id number]. Topic: Clinical - Medication Question >> Apr 12, 2024  9:42 AM Antony RAMAN wrote: Reason for CRM: wants to get back on sertraline  (ZOLOFT ) 50 MG tablet

## 2024-04-13 ENCOUNTER — Ambulatory Visit (HOSPITAL_BASED_OUTPATIENT_CLINIC_OR_DEPARTMENT_OTHER): Admitting: Physical Therapy

## 2024-04-13 ENCOUNTER — Encounter (HOSPITAL_BASED_OUTPATIENT_CLINIC_OR_DEPARTMENT_OTHER): Payer: Self-pay | Admitting: Physical Therapy

## 2024-04-13 ENCOUNTER — Other Ambulatory Visit: Payer: Self-pay | Admitting: Family Medicine

## 2024-04-13 DIAGNOSIS — M25552 Pain in left hip: Secondary | ICD-10-CM | POA: Diagnosis not present

## 2024-04-13 DIAGNOSIS — M25652 Stiffness of left hip, not elsewhere classified: Secondary | ICD-10-CM

## 2024-04-13 DIAGNOSIS — M6281 Muscle weakness (generalized): Secondary | ICD-10-CM | POA: Diagnosis not present

## 2024-04-13 DIAGNOSIS — R2689 Other abnormalities of gait and mobility: Secondary | ICD-10-CM

## 2024-04-13 MED ORDER — SERTRALINE HCL 50 MG PO TABS: 50.0000 mg | ORAL_TABLET | Freq: Every day | ORAL | 3 refills | Status: AC

## 2024-04-13 NOTE — Therapy (Addendum)
 OUTPATIENT PHYSICAL THERAPY TREATMENT      Patient Name: Taylor Singleton MRN: [id number] DOB:04-Mar-1956, 68 y.o., female Today's Date: 04/13/2024  END OF SESSION:  PT End of Session - 04/13/24 1142     Visit Number 18    Number of Visits 24    Date for Recertification  04/19/24    Authorization Type BCBS MEDICARE    Progress Note Due on Visit 20    PT Start Time 1147    PT Stop Time 1231    PT Time Calculation (min) 44 min    Activity Tolerance Patient tolerated treatment well    Behavior During Therapy WFL for tasks assessed/performed         Past Medical History:  Diagnosis Date   Anxiety    Arthritis    Chronic idiopathic constipation    GERD (gastroesophageal reflux disease)    HLD (hyperlipidemia)    Hyperlipidemia    Osteoporosis    Prolapse of posterior vaginal wall    Sciatica    Vaginal vault 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    Varicose vein of leg    Wears glasses    Past Surgical History:  Procedure Laterality Date   ANTERIOR AND POSTERIOR REPAIR WITH SACROSPINOUS FIXATION N/A 07/19/2022   Procedure: POSTERIOR REPAIR WITHP PERINEORRHAPHY AND  SACROSPINOUS FIXATION;  Surgeon: Marilynne Rosaline SAILOR, MD;  Location: [facility];  Service: Gynecology;  Laterality: N/A;  Total time requested is 1.5hrs   BREAST CYST EXCISION Left    CHOLECYSTECTOMY     CHOLECYSTECTOMY, LAPAROSCOPIC  2010   COLONOSCOPY WITH PROPOFOL  N/A 03/22/2023   Procedure: COLONOSCOPY WITH PROPOFOL ;  Surgeon: Eartha Angelia Sieving, MD;  [facility];  Service: Gastroenterology;  Laterality: N/A;  8:45AM;ASA 1   GLUTEUS MINIMUS REPAIR Left 01/24/2024   Procedure: REPAIR, TENDON, GLUTEUS MINIMUS;  Surgeon: Genelle Standing, MD;  Location: [facility];  Service: Orthopedics;  Laterality: Left;  LEFT GLUTEUS MAXIMUS TENDON TRANSFER   TOTAL ABDOMINAL HYSTERECTOMY W/ BILATERAL SALPINGOOPHORECTOMY     1990s   Patient Active Problem List   Diagnosis Date Noted    Tear of left gluteus medius tendon 01/24/2024   Candidiasis, intertrigo 08/26/2023   Seborrheic keratoses 08/26/2023   Greater trochanteric pain syndrome 05/30/2023   Acute cystitis without hematuria 05/30/2023   Encounter for colorectal cancer screening 03/22/2023   Nausea without vomiting 03/17/2023   Flatulence 03/17/2023   S/P hysterectomy 10/06/2022   Urinary frequency 10/06/2022   Vaginal burning 10/06/2022   Vaginal irritation 10/06/2022   Vulvar irritation 10/06/2022   Sebaceous cyst of labia 10/06/2022   BPPV (benign paroxysmal positional vertigo), left 01/29/2022   Laryngopharyngeal reflux (LPR) 01/29/2022   Pharyngoesophageal dysphagia 01/29/2022   HLD (hyperlipidemia)    PCP: Duanne Butler DASEN, MD  REFERRING PROVIDER: Genelle Standing, MD  REFERRING DIAG: [phone] (ICD-10-CM) - Pain in left hip 1. Left hip gluteus maximus tendon transfer 2. Left hip trochanteric bursectomy; DOS 01/24/24  THERAPY DIAG:  Pain in left hip  Other abnormalities of gait and mobility  Muscle weakness (generalized)  Stiffness of left hip, not elsewhere classified  Rationale for Evaluation and Treatment: Rehabilitation  ONSET DATE: DOS 01/24/24  SUBJECTIVE:   SUBJECTIVE STATEMENT: Going back to work December 1st. Patient states she feels like IT band is biggest remaining issue. Feels okay today.  PERTINENT HISTORY: HLD, osteoporosis PAIN:  Are you having pain? no: NPRS scale: 2-3/10 Pain location: L hip Pain description: just stiff  Aggravating factors: walking  Relieving factors: rest  PRECAUTIONS: Other: Left hip gluteus maximus tendon transfer  WEIGHT BEARING RESTRICTIONS: WBAT  FALLS:  Has patient fallen in last 6 months? No  PLOF: Independent  PATIENT GOALS: be able to function normal again  OBJECTIVE: (objective measures from initial evaluation unless otherwise dated)  PATIENT SURVEYS:  LEFS  Extreme difficulty/unable (0), Quite a bit of difficulty (1),  Moderate difficulty (2), Little difficulty (3), No difficulty (4) Survey date:  01/26/24 03/13/24  Any of your usual work, housework or school activities 3 2  2. Usual hobbies, recreational or sporting activities 3 3  3. Getting into/out of the bath 0 4  4. Walking between rooms 4 3  5. Putting on socks/shoes 4 3  6. Squatting  4 4  7. Lifting an object, like a bag of groceries from the floor 4 4  8. Performing light activities around your home 3 4  9. Performing heavy activities around your home 3 2  10. Getting into/out of a car 4 4  11. Walking 2 blocks 3 2  12. Walking 1 mile 1 0  13. Going up/down 10 stairs (1 flight) 4 3  14. Standing for 1 hour 3 0  15.  sitting for 1 hour 4 2  16. Running on even ground 1 0  17. Running on uneven ground 1 0  18. Making sharp turns while running fast 1 0  19. Hopping  1 0  20. Rolling over in bed 3 4  Score total:  54/80 44/80     COGNITION: Overall cognitive status: Within functional limits for tasks assessed     SENSATION: WFL   POSTURE: rounded shoulders and forward head  PALPATION: Grossly tender lateral L hip  LOWER EXTREMITY ROM:  Passive ROM Right eval Left eval Left  9/30  Hip flexion  > 90 108 active supine  Hip extension     Hip abduction   20deg passive  Hip adduction     Hip internal rotation     Hip external rotation     Knee flexion     Knee extension     Ankle dorsiflexion     Ankle plantarflexion     Ankle inversion     Ankle eversion      (Blank rows = not tested) *= pain/symptoms  LOWER EXTREMITY MMT:  MMT Right eval Left eval Left  9/30  Hip flexion   5  Hip extension     Hip abduction     Hip adduction     Hip internal rotation     Hip external rotation     Knee flexion 5 5 5   Knee extension 5 4+ 4+  Ankle dorsiflexion     Ankle plantarflexion     Ankle inversion     Ankle eversion      (Blank rows = not tested) *= pain/symptoms   FUNCTIONAL TESTS:  NT Transfer: labored with UE  support  GAIT: Distance walked: 100 feet Assistive device utilized: rollator Level of assistance: Modified independence Comments: slightly flexed trunk with rollator, antalgic on L    TODAY'S TREATMENT:  DATE:  04/13/24 Elliptical x2 min  Obers TFL stretch 3x30 seconds  Supine IT band stretch 3x30 seconds  Gait training  STM to vastus lateralis/ITB SL stance on L leg with contralateral hip drop (glute med activation) 2x10  04/06/24 Elliptical 4 min  LF leg press 3x10 10# Leg extension 3x10 10#  Hip abduction 25# 3x10 Leg curl 10# x10 Hip adduction 10# x10  04/03/24 Elliptical x3 min  Shuttle DL 49# [id number], 27# [id number] Shuttle SL 18# x10, 25# 2x10 Squats with blue TB around knees 2x10 Lateral walks with blue TB x2 laps  Lateral walks with mirror feedback x2 laps  Mini squats with mirror feedback x10  Partial lunges 3x10  03/30/24 SciFit bike 5 min lvl 4 Shuttle DL 49# [id number] Shuttle SL 81# 3x10 Squats with airex pad under right foot 3x10 Lateral step ups 6 inch box 3x10 Partial lunges 2x10 RDL x10 5# dumbbells   03/27/24 STM and IASTM to L ITB, piriformis, and glutes  SciFit bike lvl 3 x6 min Leg extension machine 10# 3x10 Squats with airex pad under right foot 3x10 Forward knee bend stretch with LLE elevated on plinth 3x30 seconds   03/23/24 SciFit bike x5 Mini squats with BTB around knees 2x10 Lateral walks with BTB x6 laps on rail  Step ups 6 inch box 2x10 Cybex leg press 3x10 40# RDL x10 holding cane   03/20/24 SciFit bike L2.5 x5 min Mini squats with hands on rail 3x10 with airex pad under R foot  Lateral walks with GTB x6 laps  Forward step ups 6 inch box 3x10  03/15/24 Sci fit bike L2 x55min Bridges x20 Sidelying clam 2x10 LAQ 2# 5 2x10 Sit to stands 2x10 (cues for trunk flexion) Step ups fwd 4 2x10 Side stepping 3x  3ft Stepping over single hurdle x5  03/13/24 Sci fit bike L2 x67min Updated MMT and ROM Goal review Bridges x20 Sidelying clam 2x10 LAQ 2# 5 2x10 Sit to stands x10 Step ups fwd 4 2x10  03/08/24 Scifit L2, 5 min UE on rails: hip abdct to toe touch 3x5;  hip extension to toe tap 3x5 - each LE; Bil calf raises x 10 Lt forward step up 2 x 10 with RUE on rail; repeated on 4 x 10 Lt lateral step up 2 x 10 with BUE on rail; repeated on 4 x 10 Side stepping R/L with UE on rails Seated quad stretch with foot under chair, sliding buttocks forward 20sec x 2 LLE, x 1 RLE On Airex pad: slow marching, intermittent UE on rails; narrow BOS eyes open, eyes closed x 10sec; semi tandem stance x 15sec each Gait:  25 ft x 5, with SPC cues for increasing Rt step height (to avoid shuffle),even stance time, Lt arm swing.   03/06/24 Scifit L1-2, 6 min LAQ 3# 5 2x10 Standing weight shifts on scales  Squats with light UE support Bil calf raises x 10 Standing high knee marching with UE on rails Side stepping R/L with UE on rails Trial of hip rotation with Lt lower leg on stool; limited UE on rails: Lt hip abdct to toe touch x 10; Lt hip extension to toe tap x 10 Prone Lt knee flexion with hip IR/ER, range to tolerance  02/27/24 Bike 5 minutes for hip mobility L2 PROM L hip within protocol limits Gait with SPC- x180ft. Cues for increased step length Hooklying hip abduction isometric with belt 15 x 5-10 second holds LAQ 2# 5 2x10 Partial bridges 2x10 Hooklying ball  squeeze 2x10 5 hold Standing HR 2x10 Standing hip extension 2x10   PATIENT EDUCATION:  Education details: exercise progression/ modification Person educated: Patient Education method: Explanation, Demonstration, Education comprehension: verbalized understanding, returned demonstration, verbal cues required, and tactile cues required  HOME EXERCISE PROGRAM: Access Code: [email or web address] URL:  [email or web address] Updated 03/06/24  ASSESSMENT:  CLINICAL IMPRESSION: Patient tolerated exercises well today with no significant increase in pain. Therapy focused on stretching ITB and TFL to alleviate pain and stiffness in lateral hip. Exercises focused on glute med activation to improve compensated trendelenburg gait. Educated on expected soreness ad updated HEP. Will continue to benefit from therapy to address remaining limitations.   OBJECTIVE IMPAIRMENTS: Abnormal gait, decreased activity tolerance, decreased balance, decreased endurance, decreased mobility, difficulty walking, decreased ROM, decreased strength, increased muscle spasms, impaired flexibility, improper body mechanics, and pain  ACTIVITY LIMITATIONS: lifting, bending, sitting, standing, squatting, sleeping, stairs, transfers, locomotion level, and caring for others  PARTICIPATION LIMITATIONS: meal prep, cleaning, laundry, shopping, community activity, occupation, and yard work  PERSONAL FACTORS: Time since onset of injury/illness/exacerbation and 1-2 comorbidities: HLD, osteoporosis are also affecting patient's functional outcome.   REHAB POTENTIAL: Good  CLINICAL DECISION MAKING: Evolving/moderate complexity  EVALUATION COMPLEXITY: Moderate   GOALS: Goals reviewed with patient? Yes  SHORT TERM GOALS: Target date: 03/08/2024    Patient will be independent with HEP in order to improve functional outcomes. Baseline: Goal status: MET 03/13/24  2.  Patient will report at least 25% improvement in symptoms for improved quality of life. Baseline: Goal status: MET - 03/06/24  3.  Pain free flexion to 90 Baseline:  Goal status:MET   4.  SLS 30s without compensation Baseline: (will trial closer to 8wks per protocol) Goal status: INITIAL   LONG TERM GOALS: Target date: 04/19/2024    Patient will report at least 75% improvement in symptoms for improved quality of life. Baseline:  Goal status: IN  PROGRESS  2.  Patient will improve LEFS score by at least 15 points in order to indicate improved tolerance to activity. Baseline:  Goal status: NOT MET 9/30  3.  Patient will be able to navigate stairs with reciprocal pattern without compensation in order to demonstrate improved LE strength. Baseline:  Goal status: IN PROGRESS  4.  Patient will demonstrate MMT 80% of opposite lower extremity in all tested musculature as evidence of improved strength to assist with stair ambulation and gait. Baseline:  Goal status: IN PROGRESS 03/13/24  5.  Patient will be able to return to all activities unrestricted for improved ability to perform work functions and participate with family.  Baseline:  Goal status: IN PROGRESS  PLAN:  PT FREQUENCY: 1-2x/week  PT DURATION: 12 weeks  PLANNED INTERVENTIONS: [id number]- PT Re-evaluation, 97110-Therapeutic exercises, 97530- Therapeutic activity, [id number]- Neuromuscular re-education, 97535- Self Care, [id number]- Manual therapy, [phone]- Gait training, [phone]- Orthotic Fit/training, [phone]- Canalith repositioning, [id number]- Aquatic Therapy, [phone]- Splinting, [phone]- Wound care (first 20 sq cm), 97598- Wound care (each additional 20 sq cm)Patient/Family education, Balance training, Stair training, Taping, Dry Needling, Joint mobilization, Joint manipulation, Spinal manipulation, Spinal mobilization, Scar mobilization, and DME instructions.  PLAN FOR NEXT SESSION: progress with glute tendon transfer protocol  Lili Finder, Student-PT 04/13/2024, 12:32 PM   This entire session was performed under direct supervision and direction of a licensed therapist/therapist assistant . I have personally read, edited and approve of the note as written. 12:35 PM, 04/13/24 Prentice CANDIE Stains PT, DPT Physical Therapist at [address] [facility]

## 2024-04-16 ENCOUNTER — Ambulatory Visit: Payer: Self-pay

## 2024-04-16 ENCOUNTER — Encounter: Payer: Self-pay | Admitting: Radiology

## 2024-04-16 NOTE — Telephone Encounter (Signed)
 Patient disconnected prior to transfer to this RN This RN called patient back Patient states left leg pain only at night when trying to go to sleep Offered patient an appointment for today but patient states she has another appointment today and would like to see her PCP tomorrow (04/17/2024)   FYI Only or Action Required?: FYI only for provider: appointment scheduled on 04/17/2024 at 3:30pm with her PCP Dr Butler Burr.  Patient was last seen in primary care on 03/15/2024 by Burr Butler DASEN, MD.  Called Nurse Triage reporting Leg Pain.  Symptoms began years per patient but worse in the past 3 days.  Interventions attempted: OTC medications: tylenol  or ibuprofen  and Rest, hydration, or home remedies.  Symptoms are: gradually worsening.  Triage Disposition: See PCP When Office is Open (Within 3 Days)  Patient/caregiver understands and will follow disposition?: Yes               Copied from CRM [id number]. Topic: Clinical - Red Word Triage >> Apr 16, 2024  1:23 PM Montie POUR wrote: Red Word that prompted transfer to Nurse Triage:  In her left leg she in having a lot of pain (nerve pain). Over the last 3 days, she has got about 6 hours of sleep. Pain level is a 10 every night.   01/24/24 - She had surgery to fix her left gluteus medius tear Reason for Disposition . [1] MODERATE pain (e.g., interferes with normal activities, limping) AND [2] present > 3 days  Answer Assessment - Initial Assessment Questions Patient states leg pain in her left leg only at night when trying to sleep States that this has been going on for years and she is thinking she might have restless leg syndrome & would like to talk to her PCP about this She denies any injuries, fevers, redness/swelling/heat or any pain during triage with this RN.  Patient is advised to call us  back if anything changes or with any further questions/concerns. Patient is advised that if anything worsens to go to the  Emergency Room. Patient verbalized understanding.    1. ONSET: When did the pain start?      3 days ago but has been going on for a while 2. LOCATION: Where is the pain located?      Left leg 3. PAIN: How bad is the pain?    (Scale 1-10; or mild, moderate, severe)     No pain except when trying to sleep at night 4. WORK OR EXERCISE: Has there been any recent work or exercise that involved this part of the body?      Daily use 5. CAUSE: What do you think is causing the leg pain?     Restless leg syndrome 6. OTHER SYMPTOMS: Do you have any other symptoms? (e.g., chest pain, back pain, breathing difficulty, swelling, rash, fever, numbness, weakness)     denies  Protocols used: Leg Pain-A-AH

## 2024-04-17 ENCOUNTER — Encounter: Payer: Self-pay | Admitting: Family Medicine

## 2024-04-17 ENCOUNTER — Ambulatory Visit (INDEPENDENT_AMBULATORY_CARE_PROVIDER_SITE_OTHER): Admitting: Family Medicine

## 2024-04-17 VITALS — BP 118/68 | HR 81 | Temp 98.4°F | Ht 61.0 in | Wt 148.0 lb

## 2024-04-17 DIAGNOSIS — M792 Neuralgia and neuritis, unspecified: Secondary | ICD-10-CM | POA: Diagnosis not present

## 2024-04-17 MED ORDER — GABAPENTIN 100 MG PO CAPS: 100.0000 mg | ORAL_CAPSULE | Freq: Every day | ORAL | 3 refills | Status: AC

## 2024-04-17 NOTE — Progress Notes (Signed)
 Subjective:    Patient ID: Taylor Singleton, female    DOB: [birth year], 68 y.o.   MRN: [id number] Patient had surgery on her left leg in August due to a torn gluteus muscle.  Over the last 2 months, she has had a nervelike pain radiate down her left leg.  The pain is however more in her distal calf and shin radiating into her toes.  The pain is sharp and electrical.  It occurs suddenly for no reason.  It lasts just a brief second or so and then resolves.  The patient states that is worse at night when she tries to lay down and go to sleep.  At times it keeps her awake with recurring shooting throbbing electrical pain.  It is only in the left leg.  It is not in the right leg.  She has palpable dorsalis pedis and posterior tibialis pulses in both feet.  There is no erythema in either leg.  There is no swelling in either leg.  Patient has normal sensation in the left leg and normal reflexes.  Past Medical History:  Diagnosis Date   Anxiety    Arthritis    Chronic idiopathic constipation    GERD (gastroesophageal reflux disease)    HLD (hyperlipidemia)    Hyperlipidemia    Osteoporosis    Prolapse of posterior vaginal wall    Sciatica    Vaginal vault 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    Varicose vein of leg    Wears glasses    Past Surgical History:  Procedure Laterality Date   ANTERIOR AND POSTERIOR REPAIR WITH SACROSPINOUS FIXATION N/A 07/19/2022   Procedure: POSTERIOR REPAIR WITHP PERINEORRHAPHY AND  SACROSPINOUS FIXATION;  Surgeon: Marilynne Rosaline SAILOR, MD;  Location: [facility];  Service: Gynecology;  Laterality: N/A;  Total time requested is 1.5hrs   BREAST CYST EXCISION Left    CHOLECYSTECTOMY     CHOLECYSTECTOMY, LAPAROSCOPIC  2010   COLONOSCOPY WITH PROPOFOL  N/A 03/22/2023   Procedure: COLONOSCOPY WITH PROPOFOL ;  Surgeon: Eartha Angelia Sieving, MD;  Location: [facility];  Service: Gastroenterology;  Laterality: N/A;  8:45AM;ASA 1   GLUTEUS MINIMUS REPAIR Left  01/24/2024   Procedure: REPAIR, TENDON, GLUTEUS MINIMUS;  Surgeon: Genelle Standing, MD;  Location: [facility];  Service: Orthopedics;  Laterality: Left;  LEFT GLUTEUS MAXIMUS TENDON TRANSFER   TOTAL ABDOMINAL HYSTERECTOMY W/ BILATERAL SALPINGOOPHORECTOMY     1990s   Current Outpatient Medications on File Prior to Visit  Medication Sig Dispense Refill   atorvastatin  (LIPITOR) 40 MG tablet Take 1 tablet (40 mg total) by mouth daily. 90 tablet 2   Calcium  Carb-Cholecalciferol (CALCIUM /VITAMIN D) 600-400 MG-UNIT TABS Take 2 tablets by mouth daily.     oxybutynin  (DITROPAN  XL) 5 MG 24 hr tablet Take 1 tablet (5 mg total) by mouth at bedtime. 30 tablet 11   pantoprazole  (PROTONIX ) 40 MG tablet TAKE 1 TABLET BY MOUTH EVERY DAY 90 tablet 2   psyllium (REGULOID) 0.52 g capsule Take 0.52 g by mouth daily.     sertraline  (ZOLOFT ) 50 MG tablet Take 1 tablet (50 mg total) by mouth daily. 30 tablet 3   clonazePAM  (KLONOPIN ) 0.5 MG tablet Take 1 tablet (0.5 mg total) by mouth 2 (two) times daily as needed for anxiety. (Patient not taking: Reported on 04/17/2024) 20 tablet 1   sulfamethoxazole -trimethoprim  (BACTRIM  DS) 800-160 MG tablet Take 1 tablet by mouth 2 (two) times daily. (Patient not taking: Reported on 04/17/2024) 14 tablet 0  No current facility-administered medications on file prior to visit.   No Known Allergies Social History   Socioeconomic History   Marital status: Married    Spouse name: Not on file   Number of children: 2   Years of education: Not on file   Highest education level: Not on file  Occupational History   Not on file  Tobacco Use   Smoking status: Never   Smokeless tobacco: Never  Vaping Use   Vaping status: Never Used  Substance and Sexual Activity   Alcohol use: No   Drug use: Never   Sexual activity: Not Currently    Birth control/protection: Surgical    Comment: hyst  Other Topics Concern   Not on file  Social History Narrative   Not on file    Social Drivers of Health   Financial Resource Strain: Low Risk  (11/24/2023)   Overall Financial Resource Strain (CARDIA)    Difficulty of Paying Living Expenses: Not hard at all  Food Insecurity: No Food Insecurity (11/24/2023)   Hunger Vital Sign    Worried About Running Out of Food in the Last Year: Never true    Ran Out of Food in the Last Year: Never true  Transportation Needs: No Transportation Needs (11/24/2023)   PRAPARE - Administrator, Civil Service (Medical): No    Lack of Transportation (Non-Medical): No  Physical Activity: Inactive (11/24/2023)   Exercise Vital Sign    Days of Exercise per Week: 0 days    Minutes of Exercise per Session: 0 min  Stress: No Stress Concern Present (11/24/2023)   [employer] of Occupational Health - Occupational Stress Questionnaire    Feeling of Stress: Not at all  Social Connections: Moderately Isolated (11/24/2023)   Social Connection and Isolation Panel    Frequency of Communication with Friends and Family: Three times a week    Frequency of Social Gatherings with Friends and Family: More than three times a week    Attends Religious Services: Never    Database Administrator or Organizations: No    Attends Banker Meetings: Never    Marital Status: Married  Catering Manager Violence: Not At Risk (11/24/2023)   Humiliation, Afraid, Rape, and Kick questionnaire    Fear of Current or Ex-Partner: No    Emotionally Abused: No    Physically Abused: No    Sexually Abused: No     Review of Systems  All other systems reviewed and are negative.      Objective:   Physical Exam Constitutional:      General: She is not in acute distress.    Appearance: Normal appearance. She is well-developed and normal weight. She is not ill-appearing or toxic-appearing.  Cardiovascular:     Rate and Rhythm: Normal rate and regular rhythm.     Pulses:          Dorsalis pedis pulses are 2+ on the left side.       Posterior  tibial pulses are 2+ on the left side.     Heart sounds: Normal heart sounds.  Pulmonary:     Effort: Pulmonary effort is normal. No respiratory distress.     Breath sounds: No wheezing or rales.  Musculoskeletal:     Left foot: Normal range of motion. No deformity, bunion, Charcot foot, foot drop or prominent metatarsal heads.  Feet:     Left foot:     Skin integrity: Skin integrity normal.  Skin:  Findings: No ecchymosis, erythema or rash.  Neurological:     Mental Status: She is alert and oriented to person, place, and time.     Cranial Nerves: No dysarthria or facial asymmetry.     Motor: No tremor, atrophy, abnormal muscle tone or seizure activity.     Coordination: Romberg sign negative. Finger-Nose-Finger Test normal.  Psychiatric:        Mood and Affect: Mood normal.        Behavior: Behavior normal.        Thought Content: Thought content normal.        Judgment: Judgment normal.          Neuropathic pain Exam is normal.  Pain sounds neuropathic in nature.  Try gabapentin  100 mg p.o. nightly.  If necessary gradually increase dose of medication to 300 mg nightly and if necessary up to 3 times a day.  Reassess over the next 3 to 4 weeks to see how the patient is doing

## 2024-04-18 ENCOUNTER — Encounter (HOSPITAL_BASED_OUTPATIENT_CLINIC_OR_DEPARTMENT_OTHER): Payer: Self-pay | Admitting: Physical Therapy

## 2024-04-18 ENCOUNTER — Ambulatory Visit (HOSPITAL_BASED_OUTPATIENT_CLINIC_OR_DEPARTMENT_OTHER): Attending: Orthopaedic Surgery | Admitting: Physical Therapy

## 2024-04-18 DIAGNOSIS — R2689 Other abnormalities of gait and mobility: Secondary | ICD-10-CM | POA: Insufficient documentation

## 2024-04-18 DIAGNOSIS — M6281 Muscle weakness (generalized): Secondary | ICD-10-CM | POA: Insufficient documentation

## 2024-04-18 DIAGNOSIS — M25652 Stiffness of left hip, not elsewhere classified: Secondary | ICD-10-CM | POA: Insufficient documentation

## 2024-04-18 DIAGNOSIS — M25552 Pain in left hip: Secondary | ICD-10-CM | POA: Diagnosis not present

## 2024-04-18 NOTE — Therapy (Addendum)
 OUTPATIENT PHYSICAL THERAPY TREATMENT Progress Note   Reporting Period 03/13/24 to 04/18/24   See note below for Objective Data and Assessment of Progress/Goals    Patient Name: Taylor Singleton MRN: [id number] DOB:05-13-1956, 68 y.o., female Today's Date: 04/18/2024  END OF SESSION:  PT End of Session - 04/18/24 1509     Visit Number 19    Number of Visits 32    Date for Recertification  05/16/24    Authorization Type BCBS MEDICARE    Progress Note Due on Visit 29    PT Start Time 1510    PT Stop Time 1557    PT Time Calculation (min) 47 min    Activity Tolerance Patient tolerated treatment well    Behavior During Therapy WFL for tasks assessed/performed         Past Medical History:  Diagnosis Date   Anxiety    Arthritis    Chronic idiopathic constipation    GERD (gastroesophageal reflux disease)    HLD (hyperlipidemia)    Hyperlipidemia    Osteoporosis    Prolapse of posterior vaginal wall    Sciatica    Vaginal vault 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    Varicose vein of leg    Wears glasses    Past Surgical History:  Procedure Laterality Date   ANTERIOR AND POSTERIOR REPAIR WITH SACROSPINOUS FIXATION N/A 07/19/2022   Procedure: POSTERIOR REPAIR WITHP PERINEORRHAPHY AND  SACROSPINOUS FIXATION;  Surgeon: Marilynne Rosaline SAILOR, MD;  Location: [facility] [place];  Service: Gynecology;  Laterality: N/A;  Total time requested is 1.5hrs   BREAST CYST EXCISION Left    CHOLECYSTECTOMY     CHOLECYSTECTOMY, LAPAROSCOPIC  2010   COLONOSCOPY WITH PROPOFOL  N/A 03/22/2023   Procedure: COLONOSCOPY WITH PROPOFOL ;  Surgeon: Eartha Angelia Sieving, MD;  Location: [facility];  Service: Gastroenterology;  Laterality: N/A;  8:45AM;ASA 1   GLUTEUS MINIMUS REPAIR Left 01/24/2024   Procedure: REPAIR, TENDON, GLUTEUS MINIMUS;  Surgeon: Genelle Standing, MD;  Location: [facility];  Service: Orthopedics;  Laterality: Left;  LEFT GLUTEUS MAXIMUS TENDON TRANSFER   TOTAL  ABDOMINAL HYSTERECTOMY W/ BILATERAL SALPINGOOPHORECTOMY     1990s   Patient Active Problem List   Diagnosis Date Noted   Tear of left gluteus medius tendon 01/24/2024   Candidiasis, intertrigo 08/26/2023   Seborrheic keratoses 08/26/2023   Greater trochanteric pain syndrome 05/30/2023   Acute cystitis without hematuria 05/30/2023   Encounter for colorectal cancer screening 03/22/2023   Nausea without vomiting 03/17/2023   Flatulence 03/17/2023   S/P hysterectomy 10/06/2022   Urinary frequency 10/06/2022   Vaginal burning 10/06/2022   Vaginal irritation 10/06/2022   Vulvar irritation 10/06/2022   Sebaceous cyst of labia 10/06/2022   BPPV (benign paroxysmal positional vertigo), left 01/29/2022   Laryngopharyngeal reflux (LPR) 01/29/2022   Pharyngoesophageal dysphagia 01/29/2022   HLD (hyperlipidemia)    PCP: Duanne Butler DASEN, MD  REFERRING PROVIDER: Genelle Standing, MD  REFERRING DIAG: [phone] (ICD-10-CM) - Pain in left hip 1. Left hip gluteus maximus tendon transfer 2. Left hip trochanteric bursectomy; DOS 01/24/24  THERAPY DIAG:  Pain in left hip  Muscle weakness (generalized)  Stiffness of left hip, not elsewhere classified  Other abnormalities of gait and mobility  Rationale for Evaluation and Treatment: Rehabilitation  ONSET DATE: DOS 01/24/24  SUBJECTIVE:   SUBJECTIVE STATEMENT: Doing well today but has felt weak this week.   PERTINENT HISTORY: HLD, osteoporosis PAIN:  Are you having pain? no: NPRS scale: 2-3/10 Pain location: L  hip Pain description: just stiff  Aggravating factors: walking Relieving factors: rest  PRECAUTIONS: Other: Left hip gluteus maximus tendon transfer  WEIGHT BEARING RESTRICTIONS: WBAT  FALLS:  Has patient fallen in last 6 months? No  PLOF: Independent  PATIENT GOALS: be able to function normal again  OBJECTIVE: (objective measures from initial evaluation unless otherwise dated)  PATIENT SURVEYS:  LEFS  Extreme  difficulty/unable (0), Quite a bit of difficulty (1), Moderate difficulty (2), Little difficulty (3), No difficulty (4) Survey date:  01/26/24 03/13/24 04/18/24  Any of your usual work, housework or school activities 3 2 3   2. Usual hobbies, recreational or sporting activities 3 3 3   3. Getting into/out of the bath 0 4 4  4. Walking between rooms 4 3 4   5. Putting on socks/shoes 4 3 4   6. Squatting  4 4 4   7. Lifting an object, like a bag of groceries from the floor 4 4 4   8. Performing light activities around your home 3 4 4   9. Performing heavy activities around your home 3 2 1   10. Getting into/out of a car 4 4 4   11. Walking 2 blocks 3 2 3   12. Walking 1 mile 1 0 1  13. Going up/down 10 stairs (1 flight) 4 3 4   14. Standing for 1 hour 3 0 3  15.  sitting for 1 hour 4 2 4   16. Running on even ground 1 0 2  17. Running on uneven ground 1 0 0  18. Making sharp turns while running fast 1 0 0  19. Hopping  1 0 0  20. Rolling over in bed 3 4 4   Score total:  54/80 44/80 52/80      COGNITION: Overall cognitive status: Within functional limits for tasks assessed     SENSATION: WFL   POSTURE: rounded shoulders and forward head  PALPATION: Grossly tender lateral L hip  LOWER EXTREMITY ROM:  Passive ROM Right eval Left eval Left  9/30  Hip flexion  > 90 108 active supine  Hip extension     Hip abduction   20deg passive  Hip adduction     Hip internal rotation     Hip external rotation     Knee flexion     Knee extension     Ankle dorsiflexion     Ankle plantarflexion     Ankle inversion     Ankle eversion      (Blank rows = not tested) *= pain/symptoms  LOWER EXTREMITY MMT:  MMT Right eval Left eval Left  9/30 Right 04/18/24 Left 04/18/24  Hip flexion   5 29.2 24.4  Hip extension    4+ 4+  Hip abduction    28.7 28.3  Hip adduction       Hip internal rotation       Hip external rotation       Knee flexion 5 5 5     Knee extension 5 4+ 4+ 42.9 47  Ankle  dorsiflexion       Ankle plantarflexion       Ankle inversion       Ankle eversion        (Blank rows = not tested) *= pain/symptoms   FUNCTIONAL TESTS:  NT Transfer: labored with UE support  GAIT: Distance walked: 100 feet Assistive device utilized: rollator Level of assistance: Modified independence Comments: slightly flexed trunk with rollator, antalgic on L    TODAY'S TREATMENT:  DATE:  04/18/24 Reassessment   04/13/24 Elliptical x2 min  Obers TFL stretch 3x30 seconds  Supine IT band stretch 3x30 seconds  Gait training  STM to vastus lateralis/ITB SL stance on L leg with contralateral hip drop (glute med activation) 2x10  04/06/24 Elliptical 4 min  LF leg press 3x10 10# Leg extension 3x10 10#  Hip abduction 25# 3x10 Leg curl 10# x10 Hip adduction 10# x10  04/03/24 Elliptical x3 min  Shuttle DL 49# [id number], 27# [id number] Shuttle SL 18# x10, 25# 2x10 Squats with blue TB around knees 2x10 Lateral walks with blue TB x2 laps  Lateral walks with mirror feedback x2 laps  Mini squats with mirror feedback x10  Partial lunges 3x10  03/30/24 SciFit bike 5 min lvl 4 Shuttle DL 49# [id number] Shuttle SL 81# 3x10 Squats with airex pad under right foot 3x10 Lateral step ups 6 inch box 3x10 Partial lunges 2x10 RDL x10 5# dumbbells   03/27/24 STM and IASTM to L ITB, piriformis, and glutes  SciFit bike lvl 3 x6 min Leg extension machine 10# 3x10 Squats with airex pad under right foot 3x10 Forward knee bend stretch with LLE elevated on plinth 3x30 seconds   03/23/24 SciFit bike x5 Mini squats with BTB around knees 2x10 Lateral walks with BTB x6 laps on rail  Step ups 6 inch box 2x10 Cybex leg press 3x10 40# RDL x10 holding cane   03/20/24 SciFit bike L2.5 x5 min Mini squats with hands on rail 3x10 with airex pad under R foot  Lateral walks with GTB x6  laps  Forward step ups 6 inch box 3x10  03/15/24 Sci fit bike L2 x36min Bridges x20 Sidelying clam 2x10 LAQ 2# 5 2x10 Sit to stands 2x10 (cues for trunk flexion) Step ups fwd 4 2x10 Side stepping 3x 56ft Stepping over single hurdle x5  03/13/24 Sci fit bike L2 x72min Updated MMT and ROM Goal review Bridges x20 Sidelying clam 2x10 LAQ 2# 5 2x10 Sit to stands x10 Step ups fwd 4 2x10  03/08/24 Scifit L2, 5 min UE on rails: hip abdct to toe touch 3x5;  hip extension to toe tap 3x5 - each LE; Bil calf raises x 10 Lt forward step up 2 x 10 with RUE on rail; repeated on 4 x 10 Lt lateral step up 2 x 10 with BUE on rail; repeated on 4 x 10 Side stepping R/L with UE on rails Seated quad stretch with foot under chair, sliding buttocks forward 20sec x 2 LLE, x 1 RLE On Airex pad: slow marching, intermittent UE on rails; narrow BOS eyes open, eyes closed x 10sec; semi tandem stance x 15sec each Gait:  25 ft x 5, with SPC cues for increasing Rt step height (to avoid shuffle),even stance time, Lt arm swing.   03/06/24 Scifit L1-2, 6 min LAQ 3# 5 2x10 Standing weight shifts on scales  Squats with light UE support Bil calf raises x 10 Standing high knee marching with UE on rails Side stepping R/L with UE on rails Trial of hip rotation with Lt lower leg on stool; limited UE on rails: Lt hip abdct to toe touch x 10; Lt hip extension to toe tap x 10 Prone Lt knee flexion with hip IR/ER, range to tolerance  02/27/24 Bike 5 minutes for hip mobility L2 PROM L hip within protocol limits Gait with SPC- x142ft. Cues for increased step length Hooklying hip abduction isometric with belt 15 x 5-10 second holds LAQ 2# 5 2x10 Partial  bridges 2x10 Hooklying ball squeeze 2x10 5 hold Standing HR 2x10 Standing hip extension 2x10   PATIENT EDUCATION:  Education details: exercise progression/ modification Person educated: Patient Education method: Programmer, Multimedia, Demonstration, Education  comprehension: verbalized understanding, returned demonstration, verbal cues required, and tactile cues required  HOME EXERCISE PROGRAM: Access Code: [email or web address] URL: [email or web address] Updated 03/06/24  ASSESSMENT:  CLINICAL IMPRESSION: Patient has met 3/4 short term goals and 2/5 long term goals with ability to complete HEP and improvement in symptoms, strength, ROM, activity tolerance, gait, balance, and functional mobility. Remaining goals not met due to continued deficits in LE strength, activity tolerance, gait, and muscle endurance. Patient has made good progress toward remaining goals. Patient is planning to return to work around Dec. 1st. Patient will continue to benefit from skilled physical therapy in order to improve function and reduce impairment.   OBJECTIVE IMPAIRMENTS: Abnormal gait, decreased activity tolerance, decreased balance, decreased endurance, decreased mobility, difficulty walking, decreased ROM, decreased strength, increased muscle spasms, impaired flexibility, improper body mechanics, and pain  ACTIVITY LIMITATIONS: lifting, bending, sitting, standing, squatting, sleeping, stairs, transfers, locomotion level, and caring for others  PARTICIPATION LIMITATIONS: meal prep, cleaning, laundry, shopping, community activity, occupation, and yard work  PERSONAL FACTORS: Time since onset of injury/illness/exacerbation and 1-2 comorbidities: HLD, osteoporosis are also affecting patient's functional outcome.   REHAB POTENTIAL: Good  CLINICAL DECISION MAKING: Evolving/moderate complexity  EVALUATION COMPLEXITY: Moderate   GOALS: Goals reviewed with patient? Yes  SHORT TERM GOALS: Target date: 03/08/2024    Patient will be independent with HEP in order to improve functional outcomes. Baseline: Goal status: MET 03/13/24  2.  Patient will report at least 25% improvement in symptoms for improved quality of life. Baseline: Goal status: MET - 03/06/24  3.   Pain free flexion to 90 Baseline:  Goal status:MET   4.  SLS 30s without compensation Baseline: (will trial closer to 8wks per protocol) Goal status: IN PROGRESS 04/18/24   LONG TERM GOALS: Target date: 04/19/2024    Patient will report at least 75% improvement in symptoms for improved quality of life. Baseline:  Goal status: MET 04/18/24  2.  Patient will improve LEFS score by at least 15 points in order to indicate improved tolerance to activity. Baseline:  Goal status: IN PROGRESS 04/18/24  3.  Patient will be able to navigate stairs with reciprocal pattern without compensation in order to demonstrate improved LE strength. Baseline:  Goal status: IN PROGRESS 04/18/24  4.  Patient will demonstrate MMT 80% of opposite lower extremity in all tested musculature as evidence of improved strength to assist with stair ambulation and gait. Baseline:  Goal status: MET 04/18/24  5.  Patient will be able to return to all activities unrestricted for improved ability to perform work functions and participate with family.  Baseline:  Goal status: IN PROGRESS Returns to work Dec 1st (04/18/24)  PLAN:  PT FREQUENCY: 1-2x/week  PT DURATION: 12 weeks  PLANNED INTERVENTIONS: 97164- PT Re-evaluation, 97110-Therapeutic exercises, 97530- Therapeutic activity, 97112- Neuromuscular re-education, 97535- Self Care, [id number]- Manual therapy, [id number]- Gait training, [phone]- Orthotic Fit/training, [phone]- Canalith repositioning, [id number]- Aquatic Therapy, 97760- Splinting, 97597- Wound care (first 20 sq cm), 97598- Wound care (each additional 20 sq cm)Patient/Family education, Balance training, Stair training, Taping, Dry Needling, Joint mobilization, Joint manipulation, Spinal manipulation, Spinal mobilization, Scar mobilization, and DME instructions.  PLAN FOR NEXT SESSION: progress with glute tendon transfer protocol  Lili Finder, Student-PT 04/18/2024, 3:58 PM   This entire session was performed under direct  supervision and direction of a licensed estate agent . I have personally read, edited and approve of the note as written. 3:58 PM, 04/18/24 Prentice CANDIE Stains PT, DPT Physical Therapist at [facility]

## 2024-04-19 ENCOUNTER — Ambulatory Visit (HOSPITAL_BASED_OUTPATIENT_CLINIC_OR_DEPARTMENT_OTHER)

## 2024-04-19 ENCOUNTER — Ambulatory Visit (HOSPITAL_BASED_OUTPATIENT_CLINIC_OR_DEPARTMENT_OTHER): Admitting: Student

## 2024-04-19 DIAGNOSIS — M5416 Radiculopathy, lumbar region: Secondary | ICD-10-CM

## 2024-04-19 DIAGNOSIS — M4805 Spinal stenosis, thoracolumbar region: Secondary | ICD-10-CM | POA: Diagnosis not present

## 2024-04-19 DIAGNOSIS — M48061 Spinal stenosis, lumbar region without neurogenic claudication: Secondary | ICD-10-CM | POA: Diagnosis not present

## 2024-04-19 DIAGNOSIS — M47816 Spondylosis without myelopathy or radiculopathy, lumbar region: Secondary | ICD-10-CM | POA: Diagnosis not present

## 2024-04-19 DIAGNOSIS — M5186 Other intervertebral disc disorders, lumbar region: Secondary | ICD-10-CM | POA: Diagnosis not present

## 2024-04-19 DIAGNOSIS — S76012A Strain of muscle, fascia and tendon of left hip, initial encounter: Secondary | ICD-10-CM

## 2024-04-19 MED ORDER — METHYLPREDNISOLONE 4 MG PO TBPK
ORAL_TABLET | ORAL | 0 refills | Status: AC
Start: 2024-04-19 — End: ?

## 2024-04-19 NOTE — Progress Notes (Signed)
 Post Operative Evaluation    Procedure/Date of Surgery: Left hip gluteus maximus tendon transfer 8/12  Interval History:   Patient presents today almost 3 months status post the above procedure.  She does report that over the last few weeks she has been developing some occasional electrical sensations that shoot down the leg that are very short-lived.  These occur sporadically without any known cause.  She was started on gabapentin  by her PCP but just began taking this last night.  She does note some pain in the low back particularly during periods of standing.  She is continuing work with physical therapy.   PMH/PSH/Family History/Social History/Meds/Allergies:    Past Medical History:  Diagnosis Date   Anxiety    Arthritis    Chronic idiopathic constipation    GERD (gastroesophageal reflux disease)    HLD (hyperlipidemia)    Hyperlipidemia    Osteoporosis    Prolapse of posterior vaginal wall    Sciatica    Vaginal vault 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    Varicose vein of leg    Wears glasses    Past Surgical History:  Procedure Laterality Date   ANTERIOR AND POSTERIOR REPAIR WITH SACROSPINOUS FIXATION N/A 07/19/2022   Procedure: POSTERIOR REPAIR WITHP PERINEORRHAPHY AND  SACROSPINOUS FIXATION;  Surgeon: Marilynne Rosaline SAILOR, MD;  Location: [facility];  Service: Gynecology;  Laterality: N/A;  Total time requested is 1.5hrs   BREAST CYST EXCISION Left    CHOLECYSTECTOMY     CHOLECYSTECTOMY, LAPAROSCOPIC  2010   COLONOSCOPY WITH PROPOFOL  N/A 03/22/2023   Procedure: COLONOSCOPY WITH PROPOFOL ;  Surgeon: Eartha Angelia Sieving, MD;  [facility]: [facility];  Service: Gastroenterology;  Laterality: N/A;  8:45AM;ASA 1   GLUTEUS MINIMUS REPAIR Left 01/24/2024   Procedure: REPAIR, TENDON, GLUTEUS MINIMUS;  Surgeon: Genelle Standing, MD;  Location: [facility];  Service: Orthopedics;  Laterality: Left;  LEFT GLUTEUS MAXIMUS  TENDON TRANSFER   TOTAL ABDOMINAL HYSTERECTOMY W/ BILATERAL SALPINGOOPHORECTOMY     1990s   Social History   Socioeconomic History   Marital status: Married    Spouse name: Not on file   Number of children: 2   Years of education: Not on file   Highest education level: Not on file  Occupational History   Not on file  Tobacco Use   Smoking status: Never   Smokeless tobacco: Never  Vaping Use   Vaping status: Never Used  Substance and Sexual Activity   Alcohol use: No   Drug use: Never   Sexual activity: Not Currently    Birth control/protection: Surgical    Comment: hyst  Other Topics Concern   Not on file  Social History Narrative   Not on file   Social Drivers of Health   Financial Resource Strain: Low Risk  (11/24/2023)   Overall Financial Resource Strain (CARDIA)    Difficulty of Paying Living Expenses: Not hard at all  Food Insecurity: No Food Insecurity (11/24/2023)   Hunger Vital Sign    Worried About Running Out of Food in the Last Year: Never true    Ran Out of Food in the Last Year: Never true  Transportation Needs: No Transportation Needs (11/24/2023)   PRAPARE - Administrator, Civil Service (Medical): No    Lack of Transportation (Non-Medical): No  Physical Activity: Inactive (11/24/2023)   Exercise Vital Sign    Days of Exercise per Week: 0 days    Minutes of Exercise per Session: 0 min  Stress: No Stress Concern Present (11/24/2023)   [employer] of Occupational Health - Occupational Stress Questionnaire    Feeling of Stress: Not at all  Social Connections: Moderately Isolated (11/24/2023)   Social Connection and Isolation Panel    Frequency of Communication with Friends and Family: Three times a week    Frequency of Social Gatherings with Friends and Family: More than three times a week    Attends Religious Services: Never    Database Administrator or Organizations: No    Attends Engineer, Structural: Never    Marital Status:  Married   Family History  Problem Relation Age of Onset   Diabetes Mother    Heart attack Brother    Breast cancer Neg Hx    No Known Allergies Current Outpatient Medications  Medication Sig Dispense Refill   methylPREDNISolone  (MEDROL  DOSEPAK) 4 MG TBPK tablet Take per packet instructions 1 each 0   atorvastatin  (LIPITOR) 40 MG tablet Take 1 tablet (40 mg total) by mouth daily. 90 tablet 2   Calcium  Carb-Cholecalciferol (CALCIUM /VITAMIN D) 600-400 MG-UNIT TABS Take 2 tablets by mouth daily.     clonazePAM  (KLONOPIN ) 0.5 MG tablet Take 1 tablet (0.5 mg total) by mouth 2 (two) times daily as needed for anxiety. (Patient not taking: Reported on 04/17/2024) 20 tablet 1   gabapentin  (NEURONTIN ) 100 MG capsule Take 1 capsule (100 mg total) by mouth daily after supper. 30 capsule 3   oxybutynin  (DITROPAN  XL) 5 MG 24 hr tablet Take 1 tablet (5 mg total) by mouth at bedtime. 30 tablet 11   pantoprazole  (PROTONIX ) 40 MG tablet TAKE 1 TABLET BY MOUTH EVERY DAY 90 tablet 2   psyllium (REGULOID) 0.52 g capsule Take 0.52 g by mouth daily.     sertraline  (ZOLOFT ) 50 MG tablet Take 1 tablet (50 mg total) by mouth daily. 30 tablet 3   sulfamethoxazole -trimethoprim  (BACTRIM  DS) 800-160 MG tablet Take 1 tablet by mouth 2 (two) times daily. (Patient not taking: Reported on 04/17/2024) 14 tablet 0   No current facility-administered medications for this visit.   No results found.  Review of Systems:   A ROS was performed including pertinent positives and negatives as documented in the HPI.   Musculoskeletal Exam:    There were no vitals taken for this visit.  Exam of the left hip demonstrates no evidence of erythema or drainage.  Mild tenderness with palpation over the left lumbar musculature.  Bilateral knee flexion/extension ankle dorsiflexion/plantarflexion strength is 5/5.  Distal neurosensory exam intact.  Imaging:   Xray (lumbar spine 4 views): Diffuse degenerative changes with multilevel mild  to moderate disc space narrowing.  Lower facet arthropathy.  Significant T12-L1 joint space narrowing with osteophyte formation.    I personally reviewed and interpreted the radiographs.   Assessment:   Patient presents almost 12 weeks status post left hip gluteus maximus tendon transfer.  Her hip continues to do well although she has not been experiencing some intermittent nerve symptoms traveling down the left leg.  Discussed that  this does appear to be most consistent with a lumbar radiculopathy which she does have history of in the past.  She has previously underwent ESI with Dr. Eldonna which did give some relief.  Will trial on a Medrol  Dosepak for symptom relief, and may pursue  a repeat MRI or ESI should symptoms worsen.  She will otherwise continue PT.  Plan :    - Return to clinic as scheduled on 12/17, sooner if symptoms worsen      I personally saw and evaluated the patient, and participated in the management and treatment plan.   Leonce Reveal, PA-C Orthopedics  This document was dictated using Conservation officer, historic buildings. A reasonable attempt at proof reading has been made to minimize errors.

## 2024-04-24 ENCOUNTER — Ambulatory Visit (HOSPITAL_BASED_OUTPATIENT_CLINIC_OR_DEPARTMENT_OTHER): Admitting: Physical Therapy

## 2024-04-26 ENCOUNTER — Encounter (HOSPITAL_BASED_OUTPATIENT_CLINIC_OR_DEPARTMENT_OTHER): Admitting: Physical Therapy

## 2024-04-30 ENCOUNTER — Encounter: Payer: Self-pay | Admitting: Family Medicine

## 2024-04-30 ENCOUNTER — Other Ambulatory Visit (HOSPITAL_COMMUNITY): Payer: Self-pay

## 2024-04-30 ENCOUNTER — Telehealth: Payer: Self-pay | Admitting: Pharmacy Technician

## 2024-04-30 ENCOUNTER — Other Ambulatory Visit: Payer: Self-pay | Admitting: Family Medicine

## 2024-04-30 ENCOUNTER — Ambulatory Visit (INDEPENDENT_AMBULATORY_CARE_PROVIDER_SITE_OTHER): Admitting: Family Medicine

## 2024-04-30 VITALS — BP 120/70 | HR 67 | Temp 97.9°F | Ht 61.0 in | Wt 143.0 lb

## 2024-04-30 DIAGNOSIS — R634 Abnormal weight loss: Secondary | ICD-10-CM

## 2024-04-30 DIAGNOSIS — L0100 Impetigo, unspecified: Secondary | ICD-10-CM | POA: Diagnosis not present

## 2024-04-30 MED ORDER — MUPIROCIN CALCIUM 2 % EX CREA: 1.0000 | TOPICAL_CREAM | Freq: Two times a day (BID) | CUTANEOUS | 0 refills | Status: DC

## 2024-04-30 MED ORDER — DOXYCYCLINE HYCLATE 100 MG PO TABS
100.0000 mg | ORAL_TABLET | Freq: Two times a day (BID) | ORAL | 0 refills | Status: AC
Start: 2024-04-30 — End: ?

## 2024-04-30 MED ORDER — MUPIROCIN 2 % EX OINT: 1.0000 | TOPICAL_OINTMENT | Freq: Two times a day (BID) | CUTANEOUS | 0 refills | Status: AC

## 2024-04-30 NOTE — Progress Notes (Signed)
 Subjective:    Patient ID: Taylor Singleton, female    DOB: 05/30/1956, 68 y.o.   MRN: [id number] Patient had surgery on her left leg in August due to a torn gluteus muscle.  Since that time, she has lost weight.  Last year at this time she weighed 159 pounds.  This summer she weighed 156 pounds. Wt Readings from Last 3 Encounters:  04/30/24 143 lb (64.9 kg)  04/17/24 148 lb (67.1 kg)  03/15/24 147 lb 12.8 oz (67 kg)   Since July, she has lost almost 13 pounds.  She states that she just does not have much appetite.  At other times she feels nauseated.  In July she had a CT scan of the abdomen and pelvis that showed no evidence of malignancy.  She denies any abdominal pain but she does report bloating and gas and early satiety and lack of appetite.  The reason she made the appointment today is a rash around her left nostril.  She has 5 small blisters and scabs that are formed around the left nostril that appear to be impetigo Past Medical History:  Diagnosis Date  . Anxiety   . Arthritis   . Chronic idiopathic constipation   . GERD (gastroesophageal reflux disease)   . HLD (hyperlipidemia)   . Hyperlipidemia   . Osteoporosis   . Prolapse of posterior vaginal wall   . Sciatica   . Vaginal vault 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   . Varicose vein of leg   . Wears glasses    Past Surgical History:  Procedure Laterality Date  . ANTERIOR AND POSTERIOR REPAIR WITH SACROSPINOUS FIXATION N/A 07/19/2022   Procedure: POSTERIOR REPAIR WITHP PERINEORRHAPHY AND  SACROSPINOUS FIXATION;  Surgeon: Marilynne Rosaline SAILOR, MD;  Location: [facility];  Service: Gynecology;  Laterality: N/A;  Total time requested is 1.5hrs  . BREAST CYST EXCISION Left   . CHOLECYSTECTOMY    . CHOLECYSTECTOMY, LAPAROSCOPIC  2010  . COLONOSCOPY WITH PROPOFOL  N/A 03/22/2023   Procedure: COLONOSCOPY WITH PROPOFOL ;  Surgeon: Eartha Angelia Sieving, MD;  [facility];  Service: Gastroenterology;   Laterality: N/A;  8:45AM;ASA 1  . GLUTEUS MINIMUS REPAIR Left 01/24/2024   Procedure: REPAIR, TENDON, GLUTEUS MINIMUS;  Surgeon: Genelle Standing, MD;  Location: [facility];  Service: Orthopedics;  Laterality: Left;  LEFT GLUTEUS MAXIMUS TENDON TRANSFER  . TOTAL ABDOMINAL HYSTERECTOMY W/ BILATERAL SALPINGOOPHORECTOMY     1990s   Current Outpatient Medications on File Prior to Visit  Medication Sig Dispense Refill  . atorvastatin  (LIPITOR) 40 MG tablet Take 1 tablet (40 mg total) by mouth daily. 90 tablet 2  . Calcium  Carb-Cholecalciferol (CALCIUM /VITAMIN D) 600-400 MG-UNIT TABS Take 2 tablets by mouth daily.    . clonazePAM  (KLONOPIN ) 0.5 MG tablet Take 1 tablet (0.5 mg total) by mouth 2 (two) times daily as needed for anxiety. (Patient not taking: Reported on 04/30/2024) 20 tablet 1  . gabapentin  (NEURONTIN ) 100 MG capsule Take 1 capsule (100 mg total) by mouth daily after supper. 30 capsule 3  . methylPREDNISolone  (MEDROL  DOSEPAK) 4 MG TBPK tablet Take per packet instructions 1 each 0  . oxybutynin  (DITROPAN  XL) 5 MG 24 hr tablet Take 1 tablet (5 mg total) by mouth at bedtime. 30 tablet 11  . pantoprazole  (PROTONIX ) 40 MG tablet TAKE 1 TABLET BY MOUTH EVERY DAY 90 tablet 2  . psyllium (REGULOID) 0.52 g capsule Take 0.52 g by mouth daily.    . sertraline  (ZOLOFT ) 50 MG  tablet Take 1 tablet (50 mg total) by mouth daily. 30 tablet 3  . sulfamethoxazole -trimethoprim  (BACTRIM  DS) 800-160 MG tablet Take 1 tablet by mouth 2 (two) times daily. (Patient not taking: Reported on 04/30/2024) 14 tablet 0   No current facility-administered medications on file prior to visit.   No Known Allergies Social History   Socioeconomic History  . Marital status: Married    Spouse name: Not on file  . Number of children: 2  . Years of education: Not on file  . Highest education level: Not on file  Occupational History  . Not on file  Tobacco Use  . Smoking status: Never  . Smokeless tobacco:  Never  Vaping Use  . Vaping status: Never Used  Substance and Sexual Activity  . Alcohol use: No  . Drug use: Never  . Sexual activity: Not Currently    Birth control/protection: Surgical    Comment: hyst  Other Topics Concern  . Not on file  Social History Narrative  . Not on file   Social Drivers of Health   Financial Resource Strain: Low Risk  (11/24/2023)   Overall Financial Resource Strain (CARDIA)   . Difficulty of Paying Living Expenses: Not hard at all  Food Insecurity: No Food Insecurity (11/24/2023)   Hunger Vital Sign   . Worried About Programme Researcher, Broadcasting/film/video in the Last Year: Never true   . Ran Out of Food in the Last Year: Never true  Transportation Needs: No Transportation Needs (11/24/2023)   PRAPARE - Transportation   . Lack of Transportation (Medical): No   . Lack of Transportation (Non-Medical): No  Physical Activity: Inactive (11/24/2023)   Exercise Vital Sign   . Days of Exercise per Week: 0 days   . Minutes of Exercise per Session: 0 min  Stress: No Stress Concern Present (11/24/2023)   [employer] of Occupational Health - Occupational Stress Questionnaire   . Feeling of Stress: Not at all  Social Connections: Moderately Isolated (11/24/2023)   Social Connection and Isolation Panel   . Frequency of Communication with Friends and Family: Three times a week   . Frequency of Social Gatherings with Friends and Family: More than three times a week   . Attends Religious Services: Never   . Active Member of Clubs or Organizations: No   . Attends Banker Meetings: Never   . Marital Status: Married  Catering Manager Violence: Not At Risk (11/24/2023)   Humiliation, Afraid, Rape, and Kick questionnaire   . Fear of Current or Ex-Partner: No   . Emotionally Abused: No   . Physically Abused: No   . Sexually Abused: No     Review of Systems  All other systems reviewed and are negative.      Objective:   Physical Exam Constitutional:       General: She is not in acute distress.    Appearance: Normal appearance. She is well-developed and normal weight. She is not ill-appearing or toxic-appearing.  Cardiovascular:     Rate and Rhythm: Normal rate and regular rhythm.     Heart sounds: Normal heart sounds.  Pulmonary:     Effort: Pulmonary effort is normal. No respiratory distress.     Breath sounds: No wheezing or rales.  Musculoskeletal:     Left foot: Normal range of motion. No deformity, bunion, Charcot foot, foot drop or prominent metatarsal heads.  Feet:     Left foot:     Skin integrity: Skin integrity normal.  Skin:    Findings: No ecchymosis, erythema or rash.  Neurological:     Mental Status: She is alert and oriented to person, place, and time.     Cranial Nerves: No dysarthria or facial asymmetry.     Motor: No tremor, atrophy, abnormal muscle tone or seizure activity.     Coordination: Romberg sign negative. Finger-Nose-Finger Test normal.  Psychiatric:        Mood and Affect: Mood normal.        Behavior: Behavior normal.        Thought Content: Thought content normal.        Judgment: Judgment normal.     Weight loss - Plan: CBC with Differential/Platelet, Comprehensive metabolic panel with GFR, TSH, Sedimentation rate  Impetigo I will treat the impetigo with Bactroban cream twice daily for 1 week.  If insurance would not pay for Bactroban we will use oral doxycycline.  I am more concerned by the weight loss.  I personally believe that her early satiety is likely anxiety related.  However I will start by checking a CBC a CMP a TSH and a sed rate.  If she continues to lose weight and experience early satiety I would recommend GI consultation for EGD.

## 2024-04-30 NOTE — Telephone Encounter (Signed)
 Pharmacy Patient Advocate Encounter   Received notification from Onbase that prior authorization for Mupirocin 2% cream is required/requested.   Insurance verification completed.   The patient is insured through [employer].   Per test claim:  Mupirocin 2% ointment is preferred by the insurance.  If suggested medication is appropriate, Please send in a new RX and discontinue this one. If not, please advise as to why it's not appropriate so that we may request a Prior Authorization. Please note, some preferred medications may still require a PA.  If the suggested medications have not been trialed and there are no contraindications to their use, the PA will not be submitted, as it will not be approved.

## 2024-05-01 ENCOUNTER — Ambulatory Visit: Payer: Self-pay | Admitting: Family Medicine

## 2024-05-01 ENCOUNTER — Encounter (HOSPITAL_BASED_OUTPATIENT_CLINIC_OR_DEPARTMENT_OTHER): Admitting: Physical Therapy

## 2024-05-01 LAB — CBC WITH DIFFERENTIAL/PLATELET
Absolute Lymphocytes: 1532 {cells}/uL (ref 850–3900)
Absolute Monocytes: 614 {cells}/uL (ref 200–950)
Basophils Absolute: 62 {cells}/uL (ref 0–200)
Basophils Relative: 0.9 %
Eosinophils Absolute: 69 {cells}/uL (ref 15–500)
Eosinophils Relative: 1 %
HCT: 43.1 % (ref 35.0–45.0)
Hemoglobin: 14.2 g/dL (ref 11.7–15.5)
MCH: 29.1 pg (ref 27.0–33.0)
MCHC: 32.9 g/dL (ref 32.0–36.0)
MCV: 88.3 fL (ref 80.0–100.0)
MPV: 10.7 fL (ref 7.5–12.5)
Monocytes Relative: 8.9 %
Neutro Abs: 4623 {cells}/uL (ref 1500–7800)
Neutrophils Relative %: 67 %
Platelets: 370 Thousand/uL (ref 140–400)
RBC: 4.88 Million/uL (ref 3.80–5.10)
RDW: 11.9 % (ref 11.0–15.0)
Total Lymphocyte: 22.2 %
WBC: 6.9 Thousand/uL (ref 3.8–10.8)

## 2024-05-01 LAB — COMPREHENSIVE METABOLIC PANEL WITH GFR
AG Ratio: 1.8 (calc) (ref 1.0–2.5)
ALT: 16 U/L (ref 6–29)
AST: 16 U/L (ref 10–35)
Albumin: 4.3 g/dL (ref 3.6–5.1)
Alkaline phosphatase (APISO): 75 U/L (ref 37–153)
BUN: 14 mg/dL (ref 7–25)
CO2: 28 mmol/L (ref 20–32)
Calcium: 9.9 mg/dL (ref 8.6–10.4)
Chloride: 100 mmol/L (ref 98–110)
Creat: 0.71 mg/dL (ref 0.60–1.00)
Globulin: 2.4 g/dL (ref 1.9–3.7)
Glucose, Bld: 83 mg/dL (ref 65–99)
Potassium: 4.2 mmol/L (ref 3.5–5.3)
Sodium: 138 mmol/L (ref 135–146)
Total Bilirubin: 1.1 mg/dL (ref 0.2–1.2)
Total Protein: 6.7 g/dL (ref 6.1–8.1)
eGFR: 91 mL/min/1.73m2 (ref >=60)

## 2024-05-01 LAB — SEDIMENTATION RATE: Sed Rate: 6 mm/h (ref 0–30)

## 2024-05-01 LAB — TSH: TSH: 0.55 m[IU]/L (ref 0.40–4.50)

## 2024-05-02 ENCOUNTER — Ambulatory Visit: Admitting: Obstetrics and Gynecology

## 2024-05-02 ENCOUNTER — Encounter: Payer: Self-pay | Admitting: Obstetrics and Gynecology

## 2024-05-02 VITALS — BP 112/72 | HR 80

## 2024-05-02 DIAGNOSIS — N3281 Overactive bladder: Secondary | ICD-10-CM

## 2024-05-02 DIAGNOSIS — N393 Stress incontinence (female) (male): Secondary | ICD-10-CM

## 2024-05-02 NOTE — Progress Notes (Signed)
 Taylor Singleton Return Visit  SUBJECTIVE  History of Present Illness: Taylor Singleton is a 68 y.o. female seen in follow-up for incontinence. She is s/p Posterior repair, perineorrhaphy, sacrospinous ligament fixation on 07/19/22   Wears light pads and sometimes they are damp- but not aware of when it is happening.  Leakage happens sometimes in the middle of the night. When she is home, she is going to the bathroom more often. Usually if she is out, she can hold it for 2-3 hours.   She tried ditropan  5mg  with Dr Ozan in March. Initially it seemed to get better but stopped taking because over time was not seeing a benefit. Drinking: 1 cup milk in AM, 16oz water, 8-16oz pepsi.   She is also having leakage with cough/ sneeze.   Does not feel any prolapse symptoms.   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   Past Surgical History: She  has a past surgical history that includes Total abdominal hysterectomy w/ bilateral salpingoophorectomy; Breast cyst excision (Left); Cholecystectomy, laparoscopic (2010); Anterior and posterior repair with sacrospinous fixation (N/A, 07/19/2022); Cholecystectomy; Colonoscopy with propofol  (N/A, 03/22/2023); and Gluteus minimus repair (Left, 01/24/2024).   Medications: She has a current medication list which includes the following prescription(s): atorvastatin , calcium /vitamin d, doxycycline, gabapentin , methylprednisolone , mupirocin ointment, oxybutynin , pantoprazole , psyllium, sertraline , and sulfamethoxazole -trimethoprim .   Allergies: Patient has no known allergies.   Social History: Patient  reports that she has never smoked. She has never used smokeless  tobacco. She reports that she does not drink alcohol and does not use drugs.     OBJECTIVE     Physical Exam: Vitals:   05/02/24 1031  BP: 112/72  Pulse: 80   Gen: No apparent distress, A&O x 3.  Detailed Urogynecologic Evaluation:  Deferred.    ASSESSMENT AND PLAN    [name] is a 68 y.o. with:  1. SUI (stress urinary incontinence, female)   2. Overactive bladder     - Unclear reason for leakage throughout the day. We discussed it could be SUI alone or some overactive bladder.  - We discussed drinking more water and limiting bladder irritants such as soda. We could try another OAB medication but she is not interested due to potential side effects.  - Recommended starting with pelvic physical therapy, referral placed. If she is not seeing improvement, then will plan for urodynamic testing to better assess cause of leakage.  - Briefly reviewed treatment options for SUI and OAB.   Return 3 months  Rosaline LOISE Caper, MD  Time spent: I spent 35 minutes dedicated to the care of this patient on the date of this encounter to include pre-visit review of records, face-to-face time with the patient and post visit documentation.

## 2024-05-02 NOTE — Patient Instructions (Signed)

## 2024-05-03 ENCOUNTER — Encounter (HOSPITAL_BASED_OUTPATIENT_CLINIC_OR_DEPARTMENT_OTHER): Admitting: Physical Therapy

## 2024-05-04 ENCOUNTER — Other Ambulatory Visit: Payer: Self-pay | Admitting: Family Medicine

## 2024-05-04 ENCOUNTER — Telehealth: Payer: Self-pay

## 2024-05-04 DIAGNOSIS — K224 Dyskinesia of esophagus: Secondary | ICD-10-CM

## 2024-05-04 DIAGNOSIS — K219 Gastro-esophageal reflux disease without esophagitis: Secondary | ICD-10-CM

## 2024-05-04 NOTE — Telephone Encounter (Signed)
 Pt. Was contacted by phone. [facility] pt. Had picked up a 90 day supply on 04/15/24 she was unaware it was in her medication bag. Once alerted she was thankful for the reminder. She has a full bottle of medication and is no longer in need.

## 2024-05-04 NOTE — Telephone Encounter (Signed)
 Copied from CRM #[id number]. Topic: Clinical - Prescription Issue >> May 04, 2024  2:14 PM Lauren C wrote: Reason for CRM: FYI- Pt is very concerned that she will be out of pantoprazole  and knows Dr. Duanne will be out all week next week. Pt requested I made sure that she will be able to get her medication called in anyway even if he is OOO. I did advise it may take 3 business days and that he may be able to get to her request even if he is not in office physically. If there will be any issues though, please reach out to patient.

## 2024-05-04 NOTE — Telephone Encounter (Unsigned)
 Copied from CRM #[id number]. Topic: Clinical - Medication Refill >> May 04, 2024 10:55 AM Treva T wrote: Medication: pantoprazole  (PROTONIX ) 40 MG tablet  Has the patient contacted their pharmacy? Yes, advised to contact office   This is the patient's preferred pharmacy:  [id number] - [place], [place] - [address] [place] [address] Phone: [phone] Fax: [phone]    Is this the correct pharmacy for this prescription? Yes If no, delete pharmacy and type the correct one.   Has the prescription been filled recently? Yes  Is the patient out of the medication? Yes  Has the patient been seen for an appointment in the last year OR does the patient have an upcoming appointment? Yes  Can we respond through MyChart? No, pt prefers phone call [phone]   Agent: Please be advised that Rx refills may take up to 3 business days. We ask that you follow-up with your pharmacy.

## 2024-05-06 NOTE — Telephone Encounter (Signed)
 Rx 04/12/24 #90 2RF- too soon Requested Prescriptions  Pending Prescriptions Disp Refills   pantoprazole  (PROTONIX ) 40 MG tablet 90 tablet 2    Sig: Take 1 tablet (40 mg total) by mouth daily.     Gastroenterology: Proton Pump Inhibitors Passed - 05/06/2024  9:29 AM      Passed - Valid encounter within last 12 months    Recent Outpatient Visits           6 days ago Weight loss   [place] [facility] Duanne, Butler DASEN, MD   2 weeks ago Neuropathic pain   [place] [facility] Duanne, Butler DASEN, MD   1 month ago Anxiety   [facility] Duanne Butler DASEN, MD   2 months ago Acute cystitis without hematuria   [place] [facility] Duanne Butler DASEN, MD   2 months ago Acute cystitis without hematuria   [place] [facility] Pickard, Butler DASEN, MD       Future Appointments             In 3 weeks Genelle Standing, MD [facility] at [facility] [place]   In 3 months Marilynne, Rosaline SAILOR, MD [facility] at [facility]

## 2024-05-07 ENCOUNTER — Encounter (HOSPITAL_BASED_OUTPATIENT_CLINIC_OR_DEPARTMENT_OTHER)

## 2024-05-08 ENCOUNTER — Encounter: Payer: Self-pay | Admitting: Family Medicine

## 2024-05-08 ENCOUNTER — Ambulatory Visit: Admitting: Family Medicine

## 2024-05-08 VITALS — BP 126/66 | HR 81 | Temp 98.6°F | Ht 61.0 in | Wt 145.6 lb

## 2024-05-08 DIAGNOSIS — K224 Dyskinesia of esophagus: Secondary | ICD-10-CM

## 2024-05-08 DIAGNOSIS — R634 Abnormal weight loss: Secondary | ICD-10-CM | POA: Diagnosis not present

## 2024-05-08 DIAGNOSIS — R110 Nausea: Secondary | ICD-10-CM

## 2024-05-08 NOTE — Progress Notes (Signed)
 Acute Office Visit  Patient ID: Taylor Singleton, female    DOB: October 09, 1955, 68 y.o.   MRN: [id number]  PCP: Duanne Butler DASEN, MD  Chief Complaint  Patient presents with   Medical Management of Chronic Issues    Lab results      Subjective:     HPI  Discussed the use of AI scribe software for clinical note transcription with the patient, who gave verbal consent to proceed.  History of Present Illness Taylor Singleton is a 68 year old female who presents with persistent nausea after eating.  She experiences nausea primarily after eating, which is a recent development. The nausea is not consistent with every meal, as she sometimes feels fine, such as when she ate shrimp tacos recently. She has been unable to identify specific food triggers and is cautious about trying new foods due to fear of nausea.  Her past medical history includes a total tear of the gluteus muscle, for which she had surgery on August 12th, followed by physical therapy. She also had a total hysterectomy in the past. She has experienced weight loss since her surgery, although her eating habits have not changed significantly.  She has a history of acid reflux for which she takes medication, and she reports that this medication helps. She also experienced constipation in the past, which was managed with a ten-ounce bottle of a laxative, possibly Miralax. She currently takes psyllium capsules to aid bowel movements and reports regular bowel movements.  No vomiting, blood in stool, melena, diarrhea, constipation, or abdominal pain. No fever, chills, night sweats. She recalls having black stools while on medication for a nasal infection, which resolved after stopping the medication. She denies any food allergies or sensitivities and does not consume alcohol or use tobacco or marijuana.  Her current medications include gabapentin  for sleep issues and Zoloft  for anxiety. She denies any new medications coinciding with the onset  of nausea. She also reports some urinary incontinence and is scheduled for pelvic floor exercises starting in January.   Review of Systems  All other systems reviewed and are negative.   Past Medical History:  Diagnosis Date   Anxiety    Arthritis    Chronic idiopathic constipation    GERD (gastroesophageal reflux disease)    HLD (hyperlipidemia)    Hyperlipidemia    Osteoporosis    Prolapse of posterior vaginal wall    Sciatica    Vaginal vault 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    Varicose vein of leg    Wears glasses     Past Surgical History:  Procedure Laterality Date   ANTERIOR AND POSTERIOR REPAIR WITH SACROSPINOUS FIXATION N/A 07/19/2022   Procedure: POSTERIOR REPAIR WITHP PERINEORRHAPHY AND  SACROSPINOUS FIXATION;  Surgeon: Marilynne Rosaline SAILOR, MD;  Location: [facility];  Service: Gynecology;  Laterality: N/A;  Total time requested is 1.5hrs   BREAST CYST EXCISION Left    CHOLECYSTECTOMY     CHOLECYSTECTOMY, LAPAROSCOPIC  2010   COLONOSCOPY WITH PROPOFOL  N/A 03/22/2023   Procedure: COLONOSCOPY WITH PROPOFOL ;  Surgeon: Eartha Angelia Sieving, MD;  [facility];  Service: Gastroenterology;  Laterality: N/A;  8:45AM;ASA 1   GLUTEUS MINIMUS REPAIR Left 01/24/2024   Procedure: REPAIR, TENDON, GLUTEUS MINIMUS;  Surgeon: Genelle Standing, MD;  Location: [facility];  Service: Orthopedics;  Laterality: Left;  LEFT GLUTEUS MAXIMUS TENDON TRANSFER   TOTAL ABDOMINAL HYSTERECTOMY W/ BILATERAL SALPINGOOPHORECTOMY     1990s    Current Outpatient Medications  on File Prior to Visit  Medication Sig Dispense Refill   atorvastatin  (LIPITOR) 40 MG tablet Take 1 tablet (40 mg total) by mouth daily. 90 tablet 2   Calcium  Carb-Cholecalciferol (CALCIUM /VITAMIN D) 600-400 MG-UNIT TABS Take 2 tablets by mouth daily.     gabapentin  (NEURONTIN ) 100 MG capsule Take 1 capsule (100 mg total) by mouth daily after supper. 30 capsule 3   mupirocin  ointment  (BACTROBAN ) 2 % Apply 1 Application topically 2 (two) times daily. 22 g 0   pantoprazole  (PROTONIX ) 40 MG tablet TAKE 1 TABLET BY MOUTH EVERY DAY 90 tablet 2   psyllium (REGULOID) 0.52 g capsule Take 0.52 g by mouth daily.     sertraline  (ZOLOFT ) 50 MG tablet Take 1 tablet (50 mg total) by mouth daily. 30 tablet 3   doxycycline  (VIBRA -TABS) 100 MG tablet Take 1 tablet (100 mg total) by mouth 2 (two) times daily. (Patient not taking: Reported on 05/08/2024) 14 tablet 0   methylPREDNISolone  (MEDROL  DOSEPAK) 4 MG TBPK tablet Take per packet instructions (Patient not taking: Reported on 05/08/2024) 1 each 0   oxybutynin  (DITROPAN  XL) 5 MG 24 hr tablet Take 1 tablet (5 mg total) by mouth at bedtime. (Patient not taking: Reported on 05/08/2024) 30 tablet 11   sulfamethoxazole -trimethoprim  (BACTRIM  DS) 800-160 MG tablet Take 1 tablet by mouth 2 (two) times daily. (Patient not taking: Reported on 05/08/2024) 14 tablet 0   No current facility-administered medications on file prior to visit.    No Known Allergies     Objective:    BP 126/66   Pulse 81   Temp 98.6 F (37 C)   Ht 5' 1 (1.549 m)   Wt 145 lb 9.6 oz (66 kg)   SpO2 98%   BMI 27.51 kg/m  Wt Readings from Last 3 Encounters:  05/08/24 145 lb 9.6 oz (66 kg)  04/30/24 143 lb (64.9 kg)  04/17/24 148 lb (67.1 kg)      Physical Exam Vitals and nursing note reviewed.  Constitutional:      Appearance: Normal appearance. She is normal weight.  HENT:     Head: Normocephalic and atraumatic.  Cardiovascular:     Rate and Rhythm: Normal rate and regular rhythm.     Pulses: Normal pulses.     Heart sounds: Normal heart sounds.  Pulmonary:     Effort: Pulmonary effort is normal.     Breath sounds: Normal breath sounds.  Abdominal:     General: Bowel sounds are normal. There is no distension.     Palpations: Abdomen is soft. There is no mass.     Tenderness: There is abdominal tenderness in the right lower quadrant, suprapubic area  and left lower quadrant.  Skin:    General: Skin is warm and dry.  Neurological:     General: No focal deficit present.     Mental Status: She is alert and oriented to person, place, and time. Mental status is at baseline.  Psychiatric:        Mood and Affect: Mood normal.        Behavior: Behavior normal.        Thought Content: Thought content normal.        Judgment: Judgment normal.       No results found for any visits on 05/08/24.     Assessment & Plan:   Problem List Items Addressed This Visit   None Visit Diagnoses       Nausea    -  Primary   Relevant Orders   Ambulatory referral to Gastroenterology     Esophageal dysmotilities       Relevant Orders   Ambulatory referral to Gastroenterology     Weight loss       Relevant Orders   Ambulatory referral to Gastroenterology       Assessment and Plan Assessment & Plan Nausea Intermittent postprandial nausea without gastrointestinal or metabolic abnormalities. No weight loss or new medication correlation. Mild abdominal tenderness noted. - Recommend gastroenterology consult for EGD if symptoms persist or worsen by PCP at recent OV for weight loss, labs unremarkable. - Start food diary to track symptoms and potential triggers. - Ensure medications are taken with food. - Maintain hydration and monitor bowel movements.  Gastroesophageal reflux disease (GERD) GERD controlled with pantoprazole . No current acid reflux issues, but recent nausea noted. - Continue pantoprazole  as prescribed.    No orders of the defined types were placed in this encounter.   Return with PCP if symptoms worsen or fail to improve.  Jeoffrey GORMAN Barrio, FNP [facility]

## 2024-05-09 ENCOUNTER — Encounter (HOSPITAL_BASED_OUTPATIENT_CLINIC_OR_DEPARTMENT_OTHER): Admitting: Physical Therapy

## 2024-05-24 ENCOUNTER — Telehealth: Payer: Self-pay | Admitting: Gastroenterology

## 2024-05-24 NOTE — Telephone Encounter (Signed)
 Good afternoon Dr. San  The following patient has been referred to us  for nausea, weight loss and Esophageal dysmotilities. She has been a patient with  GI but wishes to come  to us  because she wants a new GI doctor because her current doctor moved. Records are available with care everywhere. Please review and advise on scheduling. Thank you.

## 2024-05-29 NOTE — Telephone Encounter (Signed)
 Left a VM for patient to call and schedule ov

## 2024-05-30 ENCOUNTER — Ambulatory Visit (HOSPITAL_BASED_OUTPATIENT_CLINIC_OR_DEPARTMENT_OTHER): Admitting: Orthopaedic Surgery

## 2024-05-30 DIAGNOSIS — S76012A Strain of muscle, fascia and tendon of left hip, initial encounter: Secondary | ICD-10-CM

## 2024-05-30 NOTE — Progress Notes (Signed)
 Post Operative Evaluation    Procedure/Date of Surgery: Left hip gluteus maximus tendon transfer 8/12  Interval History:    Presents 12 weeks status post the above procedure.  She is continue to progress.  Overall she is occasionally having some IT band type symptoms on the lateral hip.  This is coming along slowly with time   PMH/PSH/Family History/Social History/Meds/Allergies:    Past Medical History:  Diagnosis Date   Anxiety    Arthritis    Chronic idiopathic constipation    GERD (gastroesophageal reflux disease)    HLD (hyperlipidemia)    Hyperlipidemia    Osteoporosis    Prolapse of posterior vaginal wall    Sciatica    Vaginal vault 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    Varicose vein of leg    Wears glasses    Past Surgical History:  Procedure Laterality Date   ANTERIOR AND POSTERIOR REPAIR WITH SACROSPINOUS FIXATION N/A 07/19/2022   Procedure: POSTERIOR REPAIR WITHP PERINEORRHAPHY AND  SACROSPINOUS FIXATION;  Surgeon: Marilynne Rosaline SAILOR, MD;  Location: [facility];  Service: Gynecology;  Laterality: N/A;  Total time requested is 1.5hrs   BREAST CYST EXCISION Left    CHOLECYSTECTOMY     CHOLECYSTECTOMY, LAPAROSCOPIC  2010   COLONOSCOPY WITH PROPOFOL  N/A 03/22/2023   Procedure: COLONOSCOPY WITH PROPOFOL ;  Surgeon: Eartha Angelia Sieving, MD;  [facility]: [facility];  Service: Gastroenterology;  Laterality: N/A;  8:45AM;ASA 1   GLUTEUS MINIMUS REPAIR Left 01/24/2024   Procedure: REPAIR, TENDON, GLUTEUS MINIMUS;  Surgeon: Genelle Standing, MD;  Location: [facility];  Service: Orthopedics;  Laterality: Left;  LEFT GLUTEUS MAXIMUS TENDON TRANSFER   TOTAL ABDOMINAL HYSTERECTOMY W/ BILATERAL SALPINGOOPHORECTOMY     1990s   Social History   Socioeconomic History   Marital status: Married    Spouse name: Not on file   Number of children: 2   Years of education: Not on file   Highest education level: Not on  file  Occupational History   Not on file  Tobacco Use   Smoking status: Never   Smokeless tobacco: Never  Vaping Use   Vaping status: Never Used  Substance and Sexual Activity   Alcohol use: No   Drug use: Never   Sexual activity: Not Currently    Birth control/protection: Surgical    Comment: hyst  Other Topics Concern   Not on file  Social History Narrative   Not on file   Social Drivers of Health   Tobacco Use: Low Risk (05/08/2024)   Patient History    Smoking Tobacco Use: Never    Smokeless Tobacco Use: Never    Passive Exposure: Not on file  Financial Resource Strain: Low Risk (11/24/2023)   Overall Financial Resource Strain (CARDIA)    Difficulty of Paying Living Expenses: Not hard at all  Food Insecurity: No Food Insecurity (11/24/2023)   Epic    Worried About Programme Researcher, Broadcasting/film/video in the Last Year: Never true    Ran Out of Food in the Last Year: Never true  Transportation Needs: No Transportation Needs (11/24/2023)   Epic    Lack of Transportation (Medical): No    Lack of Transportation (Non-Medical): No  Physical Activity: Inactive (11/24/2023)   Exercise Vital Sign    Days of Exercise per Week: 0 days  Minutes of Exercise per Session: 0 min  Stress: No Stress Concern Present (11/24/2023)   [employer] of Occupational Health - Occupational Stress Questionnaire    Feeling of Stress: Not at all  Social Connections: Moderately Isolated (11/24/2023)   Social Connection and Isolation Panel    Frequency of Communication with Friends and Family: Three times a week    Frequency of Social Gatherings with Friends and Family: More than three times a week    Attends Religious Services: Never    Database Administrator or Organizations: No    Attends Banker Meetings: Never    Marital Status: Married  Depression (PHQ2-9): Low Risk (05/08/2024)   Depression (PHQ2-9)    PHQ-2 Score: 2  Alcohol Screen: Low Risk (11/24/2023)   Alcohol Screen    Last  Alcohol Screening Score (AUDIT): 0  Housing: Unknown (11/24/2023)   Epic    Unable to Pay for Housing in the Last Year: No    Number of Times Moved in the Last Year: Not on file    Homeless in the Last Year: No  Utilities: Not At Risk (11/24/2023)   Epic    Threatened with loss of utilities: No  Health Literacy: Adequate Health Literacy (11/24/2023)   B1300 Health Literacy    Frequency of need for help with medical instructions: Never   Family History  Problem Relation Age of Onset   Diabetes Mother    Heart attack Brother    Breast cancer Neg Hx    No Known Allergies Current Outpatient Medications  Medication Sig Dispense Refill   atorvastatin  (LIPITOR) 40 MG tablet Take 1 tablet (40 mg total) by mouth daily. 90 tablet 2   Calcium  Carb-Cholecalciferol (CALCIUM /VITAMIN D) 600-400 MG-UNIT TABS Take 2 tablets by mouth daily.     doxycycline  (VIBRA -TABS) 100 MG tablet Take 1 tablet (100 mg total) by mouth 2 (two) times daily. (Patient not taking: Reported on 05/08/2024) 14 tablet 0   gabapentin  (NEURONTIN ) 100 MG capsule Take 1 capsule (100 mg total) by mouth daily after supper. 30 capsule 3   methylPREDNISolone  (MEDROL  DOSEPAK) 4 MG TBPK tablet Take per packet instructions (Patient not taking: Reported on 05/08/2024) 1 each 0   mupirocin  ointment (BACTROBAN ) 2 % Apply 1 Application topically 2 (two) times daily. 22 g 0   oxybutynin  (DITROPAN  XL) 5 MG 24 hr tablet Take 1 tablet (5 mg total) by mouth at bedtime. (Patient not taking: Reported on 05/08/2024) 30 tablet 11   pantoprazole  (PROTONIX ) 40 MG tablet TAKE 1 TABLET BY MOUTH EVERY DAY 90 tablet 2   psyllium (REGULOID) 0.52 g capsule Take 0.52 g by mouth daily.     sertraline  (ZOLOFT ) 50 MG tablet Take 1 tablet (50 mg total) by mouth daily. 30 tablet 3   sulfamethoxazole -trimethoprim  (BACTRIM  DS) 800-160 MG tablet Take 1 tablet by mouth 2 (two) times daily. (Patient not taking: Reported on 05/08/2024) 14 tablet 0   No current  facility-administered medications for this visit.   No results found.  Review of Systems:   A ROS was performed including pertinent positives and negatives as documented in the HPI.   Musculoskeletal Exam:    There were no vitals taken for this visit.  Left hip is well-appearing without erythema or drainage.  Incision is well-appearing 30 degrees internal/external rotation of the hip without pain  Imaging:      I personally reviewed and interpreted the radiographs.   Assessment:   Presents today status post 16 weeks  status post left hip gluteus maximus tendon transfer continuing to improve.  At this time I do believe she is improving and is continuing to make progress.  I would like to see her back in 3 months for reassessment   - Return to clinic 12 weeks for reassessment      I personally saw and evaluated the patient, and participated in the management and treatment plan.  Elspeth Parker, MD Attending Physician, Orthopedic Surgery  This document was dictated using Dragon voice recognition software. A reasonable attempt at proof reading has been made to minimize errors.

## 2024-05-31 ENCOUNTER — Ambulatory Visit: Admitting: Podiatry

## 2024-05-31 ENCOUNTER — Encounter: Payer: Self-pay | Admitting: Podiatry

## 2024-05-31 DIAGNOSIS — L600 Ingrowing nail: Secondary | ICD-10-CM

## 2024-05-31 NOTE — Progress Notes (Signed)
 Patient complains of painful ingrown border hallux nail right.  There is an bothering her for several weeks now.. Patient denies fevers, chills, nausea, vomiting.  Objective:  Vitals: Reviewed  General: Well developed, nourished, in no acute distress, alert and oriented x3   Vascular: DP pulse 2/4 bilateral. PT pulse 2/4 bilateral.  Mild to moderate edema toe with ingrown nail.  Capillary refill time immediate bilaterally  Dermatology: Erythema, edema, incurvated nail border lateral border hallux right with slight clear drainage . Tenderness present with palpation. Normal skin tone and texture feet with normal hair growth.  Neurological: Grossly intact. Normal reflexes.   Musculoskeletal: Tenderness with palpation of the distal hallux right. No tenderness or painful ROM at IPJ.  Diagnosis: Ingrown nail lateral border hallux nail right  Plan: -discussed etiology and treatment of ingrown nails. Discussed surgical vs conservative treatment. -Consent signed for appropriate matrixectomy affected nail(s).   Procedure(s):   - Matrixectomy(s) border hallux nail right: Toe anesthetized with 3cc 2:1 mixture 2% Lidocaine  with epinephrine : Sodium Bicarbonate. Surgical site prepped. Digital tourniquet applied.  Avulsion of nail border. performed. Matrixecomy performed with three 30 second applications of phenol to nail matrix. Site irrigated with alcohol.  Tourniquet released with good vascularity noticed in digit.  Applied triple antibiotic to nailbed and applied gauze and Coban dressing. - Written and oral postoperative instructions given.  -Return for post-op 2 weeks.  JINNY Prentice Binder, DPM

## 2024-05-31 NOTE — Patient Instructions (Signed)

## 2024-06-04 ENCOUNTER — Ambulatory Visit: Payer: Self-pay

## 2024-06-04 NOTE — Telephone Encounter (Addendum)
 FYI Only or Action Required?: FYI only for provider: appointment scheduled on 06/05/24.  Patient was last seen in primary care on 05/08/2024 by Taylor Jeoffrey RAMAN, FNP.  Called Nurse Triage reporting Nausea.  Symptoms began 3 days ago.  Interventions attempted: Rest, hydration, or home remedies.  Symptoms are: gradually improving.  Triage Disposition: See Physician Within 24 Hours (overriding Home Care)  Patient/caregiver understands and will follow disposition?: Yes    Summary: possible flu, diarrhea   Reason for Triage: Patient calling for possible flu, threw up friday night, had diarrhea on Saturday & Sunday. Had fever of 99.8 yesterday. Still not feeling well today. Requesting appointment this afternoon.         Reason for Disposition  Unexplained nausea  Answer Assessment - Initial Assessment Questions 1. NAUSEA SEVERITY: How bad is the nausea? (e.g., mild, moderate, severe; dehydration, weight loss)     Nausea moderate today, vomiting Friday resolved, diarrhea Saturday/ Sunday, none today. Taking liquids, urinating 2. ONSET: When did the nausea begin?     Friday 3. VOMITING: Any vomiting? If Yes, ask: How many times today?     Friday only 4. RECURRENT SYMPTOM: Have you had nausea before? If Yes, ask: When was the last time? What happened that time?     no 5. CAUSE: What do you think is causing the nausea?      6. PREGNANCY: Is there any chance you are pregnant? (e.g., unprotected intercourse, missed birth control pill, broken condom)  Protocols used: Nausea-A-AH

## 2024-06-05 ENCOUNTER — Ambulatory Visit (INDEPENDENT_AMBULATORY_CARE_PROVIDER_SITE_OTHER): Admitting: Family Medicine

## 2024-06-05 ENCOUNTER — Encounter: Payer: Self-pay | Admitting: Family Medicine

## 2024-06-05 VITALS — BP 126/84 | HR 85 | Temp 98.1°F | Ht 61.0 in | Wt 142.5 lb

## 2024-06-05 DIAGNOSIS — Z23 Encounter for immunization: Secondary | ICD-10-CM

## 2024-06-05 DIAGNOSIS — R197 Diarrhea, unspecified: Secondary | ICD-10-CM

## 2024-06-05 DIAGNOSIS — R110 Nausea: Secondary | ICD-10-CM

## 2024-06-05 MED ORDER — ONDANSETRON HCL 4 MG PO TABS: 4.0000 mg | ORAL_TABLET | Freq: Three times a day (TID) | ORAL | 0 refills | Status: AC | PRN

## 2024-06-05 NOTE — Progress Notes (Signed)
 "  Patient Office Visit  Assessment & Plan:  Diarrhea, unspecified type  Nausea -     Ondansetron  HCl; Take 1 tablet (4 mg total) by mouth every 8 (eight) hours as needed for nausea or vomiting.  Dispense: 21 tablet; Refill: 0  Needs flu shot -     Flu vaccine HIGH DOSE PF(Fluzone Trivalent)   Assessment and Plan    Acute gastroenteritis Symptoms improved with no diarrhea today and mild nausea. Viral gastroenteritis likely. No stool studies needed. - Prescribed Zofran  for nausea. - Advised Imodium if diarrhea recurs. - Encouraged hydration with water or white grape juice. - Advised against travel if fever or chills develop.  General Health Maintenance Discussed flu vaccination benefits due to age and exposure risk. Administered flu shot.      No follow-ups on file.   Subjective:    Patient ID: Taylor Singleton, female    DOB: May 31, 1956  Age: 68 y.o. MRN: [id number]  Chief Complaint  Patient presents with   Diarrhea    Pt started vomiting on Friday night and started having diarrhea on Saturday. Pt states the diarrhea was green in color. Pt last episode diarrhea was yesterday afternoon.    Diarrhea    Discussed the use of AI scribe software for clinical note transcription with the patient, who gave verbal consent to proceed.  History of Present Illness        History of Present Illness Taylor Singleton is a 68 year old female who presents with nausea, vomiting, and diarrhea.  Nausea, vomiting, and diarrhea began on Friday night with vomiting, followed by diarrhea on Saturday and Sunday. The diarrhea was described as 'chocolatey liquid' due to minimal food intake. She experienced a fever of 99.43F during this period. The diarrhea was persistent all day Saturday and part of Sunday, but she has not experienced diarrhea today. She has not vomited today and only feels slightly nauseous. Patient did eat breakfast today and did fine.   She has been taking NyQuil and nighttime  Delsym, which she notes may have caused her diarrhea to appear green. Her husband suggested this could be due to the NyQuil.  She feels very weak and tired, stating that even short activities like driving make her feel exhausted. She has been drinking water and white grape juice to stay hydrated. She has not been eating much, but managed to have eggs and toast for breakfast today.  No respiratory symptoms, urination problems, or recent travel. She has not been out to eat recently and is slightly improving.  She is currently taking Protonix  for reflux and sertraline  (Zoloft ) for anxiety. She denies taking gabapentin  or bladder medication. Patient has lost weight and is worried about this- GI consult has been ordered.   She works part-time at [employer] and is frequently around people, which she believes may affect her immune system. She has not had a flu shot in years and has not tested for COVID-19 recently.  Physical Exam VITALS: T- 98.1 MEASUREMENTS: Weight- 142 ABDOMEN: Abdomen non-tender.  Results Labs Blood work (04/2024): Within normal limits  Assessment and Plan Acute gastroenteritis which is resolving Symptoms improved with no diarrhea today and mild nausea. Viral gastroenteritis likely. No stool studies needed. - Prescribed Zofran  for nausea. - Advised Imodium if diarrhea recurs. - Encouraged adequate hydration - Advised against travel if fever or chills develop.  General Health Maintenance Discussed flu vaccination benefits due to age and exposure risk. Administered flu shot.    The 10-year  ASCVD risk score (Arnett DK, et al., 2019) is: 8.8%  Past Medical History:  Diagnosis Date   Anxiety    Arthritis    Chronic idiopathic constipation    GERD (gastroesophageal reflux disease)    HLD (hyperlipidemia)    Hyperlipidemia    Osteoporosis    Prolapse of posterior vaginal wall    Sciatica    Vaginal vault 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    Varicose vein of leg    Wears  glasses    Past Surgical History:  Procedure Laterality Date   ANTERIOR AND POSTERIOR REPAIR WITH SACROSPINOUS FIXATION N/A 07/19/2022   Procedure: POSTERIOR REPAIR WITHP PERINEORRHAPHY AND  SACROSPINOUS FIXATION;  Surgeon: Marilynne Rosaline SAILOR, MD;  Location: [facility];  Service: Gynecology;  Laterality: N/A;  Total time requested is 1.5hrs   BREAST CYST EXCISION Left    CHOLECYSTECTOMY     CHOLECYSTECTOMY, LAPAROSCOPIC  2010   COLONOSCOPY WITH PROPOFOL  N/A 03/22/2023   Procedure: COLONOSCOPY WITH PROPOFOL ;  Surgeon: Eartha Angelia Sieving, MD;  Location: [facility];  Service: Gastroenterology;  Laterality: N/A;  8:45AM;ASA 1   GLUTEUS MINIMUS REPAIR Left 01/24/2024   Procedure: REPAIR, TENDON, GLUTEUS MINIMUS;  Surgeon: Genelle Standing, MD;  Location: [facility];  Service: Orthopedics;  Laterality: Left;  LEFT GLUTEUS MAXIMUS TENDON TRANSFER   TOTAL ABDOMINAL HYSTERECTOMY W/ BILATERAL SALPINGOOPHORECTOMY     1990s   Social History[1] Family History  Problem Relation Age of Onset   Diabetes Mother    Heart attack Brother    Breast cancer Neg Hx    Allergies[2]  Review of Systems  Gastrointestinal:  Positive for diarrhea.      Objective:    BP 126/84   Pulse 85   Temp 98.1 F (36.7 C)   Ht 5' 1 (1.549 m)   Wt 142 lb 8 oz (64.6 kg)   SpO2 99%   BMI 26.93 kg/m  BP Readings from Last 3 Encounters:  06/05/24 126/84  05/08/24 126/66  05/02/24 112/72   Wt Readings from Last 3 Encounters:  06/05/24 142 lb 8 oz (64.6 kg)  05/08/24 145 lb 9.6 oz (66 kg)  04/30/24 143 lb (64.9 kg)    Physical Exam Vitals and nursing note reviewed.  Constitutional:      General: She is not in acute distress.    Appearance: Normal appearance.  HENT:     Head: Normocephalic.     Right Ear: Tympanic membrane, ear canal and external ear normal.     Left Ear: Tympanic membrane, ear canal and external ear normal.  Eyes:     Extraocular Movements:  Extraocular movements intact.     Pupils: Pupils are equal, round, and reactive to light.  Cardiovascular:     Rate and Rhythm: Normal rate and regular rhythm.     Heart sounds: Normal heart sounds.  Pulmonary:     Effort: Pulmonary effort is normal.     Breath sounds: Normal breath sounds.  Abdominal:     General: Bowel sounds are normal.     Palpations: Abdomen is soft.     Tenderness: There is no abdominal tenderness. There is no guarding or rebound.  Musculoskeletal:     Right lower leg: No edema.     Left lower leg: No edema.  Neurological:     General: No focal deficit present.     Mental Status: She is alert and oriented to person, place, and time.  Psychiatric:  Mood and Affect: Mood normal.        Behavior: Behavior normal.      No results found for any visits on 06/05/24.          [1]  Social History Tobacco Use   Smoking status: Never   Smokeless tobacco: Never  Vaping Use   Vaping status: Never Used  Substance Use Topics   Alcohol use: No   Drug use: Never  [2] No Known Allergies  "

## 2024-06-15 ENCOUNTER — Ambulatory Visit: Admitting: Podiatry

## 2024-07-03 ENCOUNTER — Ambulatory Visit: Attending: Obstetrics and Gynecology | Admitting: Physical Therapy

## 2024-07-03 DIAGNOSIS — M6281 Muscle weakness (generalized): Secondary | ICD-10-CM | POA: Diagnosis present

## 2024-07-03 DIAGNOSIS — R293 Abnormal posture: Secondary | ICD-10-CM | POA: Insufficient documentation

## 2024-07-03 DIAGNOSIS — R279 Unspecified lack of coordination: Secondary | ICD-10-CM | POA: Insufficient documentation

## 2024-07-03 NOTE — Therapy (Signed)
 " OUTPATIENT PHYSICAL THERAPY FEMALE PELVIC EVALUATION   Patient Name: Taylor Singleton MRN: [id number] DOB:Jan 21, 1956, 69 y.o., female Today's Date: 07/03/2024  END OF SESSION:  PT End of Session - 07/03/24 1307     Visit Number 1    Number of Visits 10    Date for Recertification  12/31/24    Authorization Type [employer]    PT Start Time 1230    PT Stop Time 1315    PT Time Calculation (min) 45 min    Activity Tolerance Patient tolerated treatment well    Behavior During Therapy WFL for tasks assessed/performed          Past Medical History:  Diagnosis Date   Anxiety    Arthritis    Chronic idiopathic constipation    GERD (gastroesophageal reflux disease)    HLD (hyperlipidemia)    Hyperlipidemia    Osteoporosis    Prolapse of posterior vaginal wall    Sciatica    Vaginal vault 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    Varicose vein of leg    Wears glasses    Past Surgical History:  Procedure Laterality Date   ANTERIOR AND POSTERIOR REPAIR WITH SACROSPINOUS FIXATION N/A 07/19/2022   Procedure: POSTERIOR REPAIR WITHP PERINEORRHAPHY AND  SACROSPINOUS FIXATION;  Surgeon: Marilynne Rosaline SAILOR, MD;  Location: [facility];  Service: Gynecology;  Laterality: N/A;  Total time requested is 1.5hrs   BREAST CYST EXCISION Left    CHOLECYSTECTOMY     CHOLECYSTECTOMY, LAPAROSCOPIC  2010   COLONOSCOPY WITH PROPOFOL  N/A 03/22/2023   Procedure: COLONOSCOPY WITH PROPOFOL ;  Surgeon: Eartha Angelia Sieving, MD;  Location: [facility];  Service: Gastroenterology;  Laterality: N/A;  8:45AM;ASA 1   GLUTEUS MINIMUS REPAIR Left 01/24/2024   Procedure: REPAIR, TENDON, GLUTEUS MINIMUS;  Surgeon: Genelle Standing, MD;  Location: [facility];  Service: Orthopedics;  Laterality: Left;  LEFT GLUTEUS MAXIMUS TENDON TRANSFER   TOTAL ABDOMINAL HYSTERECTOMY W/ BILATERAL SALPINGOOPHORECTOMY     1990s   Patient Active Problem List   Diagnosis Date Noted   Tear of left gluteus medius  tendon 01/24/2024   Candidiasis, intertrigo 08/26/2023   Seborrheic keratoses 08/26/2023   Greater trochanteric pain syndrome 05/30/2023   Acute cystitis without hematuria 05/30/2023   Encounter for colorectal cancer screening 03/22/2023   Nausea without vomiting 03/17/2023   Flatulence 03/17/2023   S/P hysterectomy 10/06/2022   Urinary frequency 10/06/2022   Vaginal burning 10/06/2022   Vaginal irritation 10/06/2022   Vulvar irritation 10/06/2022   Sebaceous cyst of labia 10/06/2022   BPPV (benign paroxysmal positional vertigo), left 01/29/2022   Laryngopharyngeal reflux (LPR) 01/29/2022   Pharyngoesophageal dysphagia 01/29/2022   HLD (hyperlipidemia)     PCP: Duanne Butler DASEN, MD  REFERRING PROVIDER: Marilynne Rosaline SAILOR, MD   REFERRING DIAG: N39.3 (ICD-10-CM) - SUI (stress urinary incontinence, female) N32.81 (ICD-10-CM) - Overactive bladder  THERAPY DIAG:  Muscle weakness (generalized)  Unspecified lack of coordination  Abnormal posture  Rationale for Evaluation and Treatment: Rehabilitation  ONSET DATE: maybe 1 year ago   SUBJECTIVE:  SUBJECTIVE STATEMENT: Eval: Pt reports to PFPT with constipation and urinary incontinence. She experiences stress incontinence and urge incontinence. Pt had prolapse repair surgery in '24 and surgery to her glute min in August (unknown etiology). She also deals with sciatic nerve pain on the left side too.  Fluid intake: 16 oz pepsi sometimes none, sometimes the full bottle; chai tea maybe 1 per week; water intake is bad - maybe 16 fl oz per day   FUNCTIONAL LIMITATIONS: needing to know where the bathroom is in public scenarios   PERTINENT HISTORY:  Medications for current condition: none currently  Surgeries: Total abdominal hysterectomy w/  bilateral salpingoophorectomy; Breast cyst excision (Left); Cholecystectomy, laparoscopic (2010); Anterior and posterior repair with sacrospinous fixation (07/19/2022); Cholecystectomy; Colonoscopy (03/22/2023); Gluteus minimus repair (Left, 01/24/2024).  Other: Anxiety, Arthritis, Chronic idiopathic constipation, GERD, Hyperlipidemia, Osteoporosis, Prolapse of posterior vaginal wall, Sciatica, Vaginal vault 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 Sexual abuse: No  PAIN:  Are you having pain? No NPRS scale: 0/10  PRECAUTIONS: None  RED FLAGS: None   WEIGHT BEARING RESTRICTIONS: No  FALLS:  Has patient fallen in last 6 months? No  OCCUPATION: works for [employer] 2 days per week   ACTIVITY LEVEL : just joined [employer]; has a treadmill at home; therapy for the glute   PLOF: Independent  PATIENT GOALS: decrease leakage   BOWEL MOVEMENT: Pain with bowel movement: No Type of bowel movement:Type (Bristol Stool Scale) 1-, Frequency maybe 3 , Strain yes, and Splinting no Fully empty rectum: Yes:   Leakage: No                                                  Caused by: no Bowel urgency: no Pads: Yes:   Fiber supplement/laxative Yes - miralax as needed   URINATION: Pain with urination: No Fully empty bladder: Yes: sometimes                                          Post-void dribble: No Stream: Strong Urgency: Yes  Frequency:during the day every hour when not working; when at work, can go 4 hours                                                         Nocturia: Yes: got up 4 times last night; usually always gets up 2 times per night    Leakage: Urge to void, Walking to the bathroom, Coughing, Sneezing, and Laughing Pads/briefs: Yes: light pad throughout the day, just 1   INTERCOURSE:  Ability to have vaginal penetration No  Pain with intercourse: none Dryness: No Climax: - Marinoff Scale: 0/3  PREGNANCY: Vaginal deliveries 2  PROLAPSE: None  OBJECTIVE:  Note: Objective  measures were completed at Evaluation unless otherwise noted.  PATIENT SURVEYS:  PFIQ-7: 45  COGNITION: Overall cognitive status: Within functional limits for tasks assessed     SENSATION: Light touch: Appears intact  LUMBAR SPECIAL TESTS:  Straight leg raise test: Positive  FUNCTIONAL TESTS:  Single leg stance:  Rt:  +  Lt: +  Sit-up test: 1/3  Squat: within functional limits  Bed mobility: within functional limits   GAIT: Assistive device utilized: None Comments: mild trendelenburg gait pattern with ambulation   POSTURE: rounded shoulders and forward head  LUMBARAROM/PROM: within functional limits for all motions bilaterally with no pain   LOWER EXTREMITY ROM: within functional limits for all motions bilaterally with no pain   LOWER EXTREMITY MMT: 3+/5 bilateral knees and hips grossly   PALPATION:  General: no tenderness to palpation of bilateral adductors   Pelvic Alignment: within functional limits   Abdominal: abdominal bracing at rest   Diastasis: No Distortion: No  Breathing: apical breathing pattern with decreased lower rib excursion with inhalation  Scar tissue: Yes:   Active Straight Leg Raise: + bilaterally                 External Perineal Exam: dryness present with sufficient clitoral hood mobility                             Internal Pelvic Floor: Pt found to have low tone and moderate strength in the pelvic floor. She is able to perform an uncoordinated pelvic floor contraction at rest, and when cued to pair this with an exhale, she has a stronger pelvic floor contraction. No pain or palpable trigger points present during today's examination.   Patient confirms identification and approves PT to assess internal pelvic floor and treatment Yes No emotional/communication barriers or cognitive limitation. Patient is motivated to learn. Patient understands and agrees with treatment goals and plan. PT explains patient will be examined in standing, sitting,  and lying down to see how their muscles and joints work. When they are ready, they will be asked to remove their underwear so PT can examine their perineum. The patient is also given the option of providing their own chaperone as one is not provided in our facility. The patient also has the right and is explained the right to defer or refuse any part of the evaluation or treatment including the internal exam. With the patient's consent, PT will use one gloved finger to gently assess the muscles of the pelvic floor, seeing how well it contracts and relaxes and if there is muscle symmetry. After, the patient will get dressed and PT and patient will discuss exam findings and plan of care. PT and patient discuss plan of care, schedule, attendance policy and HEP activities. All internal or external pelvic floor assessments and/or treatments are completed with proper hand hygiene and gloves hands. If needed gloves are changed with hand hygiene during patient care time.  PELVIC MMT:   MMT eval  Vaginal 3/5, 5 quick flicks  Internal Anal Sphincter   External Anal Sphincter   Puborectalis   (Blank rows = not tested)       TONE: Low in bilateral aspects of superficial and deep pelvic floor musculature bilaterally   PROLAPSE: None present   TODAY'S TREATMENT:  DATE:   EVAL 07/04/23: Examination completed, findings reviewed, pt educated on POC, HEP, and self care. Pt motivated to participate in PT and agreeable to attempt recommendations.   Supine pelvic floor contraction + diaphragmatic breathing 2x10  Supine pelvic floor quick flick contractions 2x10  Water intake recommendation: half of body weight ~70 fl oz daily   PATIENT EDUCATION:  Education details: relative anatomy and the connection between the diaphragm and pelvic floor; water intake recommendation  Person educated:  Patient Education method: Explanation, Demonstration, Tactile cues, Verbal cues, and Handouts Education comprehension: verbalized understanding, returned demonstration, verbal cues required, tactile cues required, and needs further education  HOME EXERCISE PROGRAM: Access Code: [email or web address] URL: [email or web address] Date: 07/03/2024 Prepared by: Celena Domino  Exercises - Supine Pelvic Floor Contraction  - 1 x daily - 7 x weekly - 2 sets - 10 reps - Quick Flick Pelvic Floor Contractions in Hook[place]  - 1 x daily - 7 x weekly - 2 sets - 10 reps  Patient Education - Get To Know Your Pelvic Floor- Female - Lifestyle Changes that Support Urinary Health - Urinary Incontinence  ASSESSMENT:  CLINICAL IMPRESSION: Patient is a 69 y.o. female  who was seen today for physical therapy evaluation and treatment for stress urinary incontinence and overactive bladder. Surgical history includes posterior repair, perineorrhaphy, sacrospinous ligament fixation on 07/19/22; Total abdominal hysterectomy w/ bilateral salpingoophorectomy; Breast cyst excision (Left); Cholecystectomy, laparoscopic (2010); Anterior and posterior repair with sacrospinous fixation (07/19/2022); Cholecystectomy; Colonoscopy (03/22/2023); Gluteus minimus repair (Left, 01/24/2024). She started noticing her urinary symptoms worsening last year and now she leaks urine with coughing/sneezing/laughing and sometimes with a high sense of urgency when she is trying to hold urine in. She is also experiencing some intermittent constipation and has a very low water intake. We discussed the benefits of increasing water intake and pt plans to do so for stool motility purposes. She fully consented to today's internal vaginal examination which revealed low tone and moderate strength in the pelvic floor. She is able to perform an uncoordinated pelvic floor contraction at rest, and when cued to pair this with an exhale, she has a stronger pelvic floor  contraction. No pain or palpable trigger points present during today's examination.   OBJECTIVE IMPAIRMENTS: decreased coordination, decreased endurance, decreased mobility, decreased ROM, and decreased strength.   ACTIVITY LIMITATIONS: continence and toileting  PARTICIPATION LIMITATIONS: cleaning, laundry, driving, shopping, community activity, and occupation  PERSONAL FACTORS: Age, Past/current experiences, and Time since onset of injury/illness/exacerbation are also affecting patient's functional outcome.   REHAB POTENTIAL: Good  CLINICAL DECISION MAKING: Stable/uncomplicated  EVALUATION COMPLEXITY: Low   GOALS: Goals reviewed with patient? Yes  SHORT TERM GOALS: Target date: 07/31/2024  Pt will be independent with HEP.  Baseline: Goal status: INITIAL  2.  Pt will be independent with use of squatty potty, relaxed toileting mechanics, and improved bowel movement techniques in order to increase ease of bowel movements and complete evacuation.  Baseline:  Goal status: INITIAL  3.  Pt will be independent with the knack, urge suppression technique, and double voiding in order to improve bladder habits and decrease urinary incontinence.  Baseline:  Goal status: INITIAL  LONG TERM GOALS: Target date: 12/31/2024  Pt will be independent with advanced HEP.  Baseline:  Goal status: INITIAL  2.  Pt to demonstrate improved coordination of pelvic floor and breathing mechanics with 10# squat with appropriate synergistic patterns to decrease pain and leakage at least 75% of the time for improved ability to  complete a 30 minute workout with strain at pelvic floor and symptoms.   Baseline:  Goal status: INITIAL  3.  Pt will report no leaks with laughing, coughing, sneezing in order to improve comfort with interpersonal relationships and community activities.  Baseline:  Goal status: INITIAL  4.  Pt will be able to teach back and utilize urge suppression technique in order to help  reduce number of trips to the bathroom.   Baseline:  Goal status: INITIAL  5.  Pt will demonstrate normal pelvic floor muscle tone and A/ROM, able to achieve 5/5 strength with contractions and 10 sec endurance, in order to reduce urinary leaking and number of pads patient wears.  Baseline:  Goal status: INITIAL  PLAN:  PT FREQUENCY: 1-2x/week  PT DURATION: 6 months  PLANNED INTERVENTIONS: 97110-Therapeutic exercises, 97530- Therapeutic activity, 97112- Neuromuscular re-education, 97535- Self Care, [id number]- Manual therapy, Patient/Family education, Taping, Joint mobilization, Spinal mobilization, Scar mobilization, Cryotherapy, Moist heat, and Biofeedback  PLAN FOR NEXT SESSION: continued pelvic floor training in seated; introduce stretching to back and hips; introduce knack and urge drill for managing incontinence; toileting mechanics for optimal emptying of bowels    Celena JAYSON Domino, PT 07/03/2024, 1:49 PM  "

## 2024-08-09 ENCOUNTER — Ambulatory Visit: Admitting: Physical Therapy

## 2024-08-10 ENCOUNTER — Ambulatory Visit: Admitting: Obstetrics and Gynecology

## 2024-08-17 ENCOUNTER — Ambulatory Visit (HOSPITAL_BASED_OUTPATIENT_CLINIC_OR_DEPARTMENT_OTHER): Admitting: Orthopaedic Surgery

## 2024-08-21 ENCOUNTER — Ambulatory Visit: Admitting: Physical Therapy

## 2024-08-28 ENCOUNTER — Ambulatory Visit: Admitting: Physical Therapy

## 2024-09-04 ENCOUNTER — Ambulatory Visit: Admitting: Physical Therapy

## 2024-09-11 ENCOUNTER — Ambulatory Visit: Admitting: Physical Therapy

## 2024-11-29 ENCOUNTER — Encounter
